# Patient Record
Sex: Female | Born: 1945 | Hispanic: Yes | Marital: Married | Smoking: Former smoker
Health system: Southern US, Community
[De-identification: ages and names within clinical notes are randomized; demographics above are authoritative.]

## PROBLEM LIST (undated history)

## (undated) DIAGNOSIS — I480 Paroxysmal atrial fibrillation: Secondary | ICD-10-CM

## (undated) DIAGNOSIS — I639 Cerebral infarction, unspecified: Secondary | ICD-10-CM

## (undated) DIAGNOSIS — I10 Essential (primary) hypertension: Secondary | ICD-10-CM

## (undated) DIAGNOSIS — E785 Hyperlipidemia, unspecified: Secondary | ICD-10-CM

## (undated) DIAGNOSIS — N3090 Cystitis, unspecified without hematuria: Secondary | ICD-10-CM

## (undated) HISTORY — PX: EYE SURGERY: SHX253

## (undated) HISTORY — PX: ABDOMINAL HYSTERECTOMY: SHX81

---

## 2017-03-14 ENCOUNTER — Encounter (HOSPITAL_COMMUNITY): Payer: Self-pay | Admitting: Emergency Medicine

## 2017-03-14 ENCOUNTER — Emergency Department (HOSPITAL_COMMUNITY): Payer: Medicaid Other

## 2017-03-14 ENCOUNTER — Inpatient Hospital Stay (HOSPITAL_COMMUNITY)
Admission: EM | Admit: 2017-03-14 | Discharge: 2017-03-19 | DRG: 065 | Disposition: A | Discharge status: Rehabilitation Facility | Payer: Medicaid Other | Attending: Internal Medicine | Admitting: Internal Medicine

## 2017-03-14 DIAGNOSIS — I10 Essential (primary) hypertension: Secondary | ICD-10-CM | POA: Diagnosis present

## 2017-03-14 DIAGNOSIS — I639 Cerebral infarction, unspecified: Secondary | ICD-10-CM

## 2017-03-14 DIAGNOSIS — Z833 Family history of diabetes mellitus: Secondary | ICD-10-CM

## 2017-03-14 DIAGNOSIS — R609 Edema, unspecified: Secondary | ICD-10-CM

## 2017-03-14 DIAGNOSIS — K5909 Other constipation: Secondary | ICD-10-CM | POA: Diagnosis present

## 2017-03-14 DIAGNOSIS — Z7902 Long term (current) use of antithrombotics/antiplatelets: Secondary | ICD-10-CM

## 2017-03-14 DIAGNOSIS — R9082 White matter disease, unspecified: Secondary | ICD-10-CM | POA: Diagnosis present

## 2017-03-14 DIAGNOSIS — Z79899 Other long term (current) drug therapy: Secondary | ICD-10-CM

## 2017-03-14 DIAGNOSIS — I471 Supraventricular tachycardia: Secondary | ICD-10-CM | POA: Diagnosis not present

## 2017-03-14 DIAGNOSIS — M25562 Pain in left knee: Secondary | ICD-10-CM

## 2017-03-14 DIAGNOSIS — G9389 Other specified disorders of brain: Secondary | ICD-10-CM | POA: Diagnosis present

## 2017-03-14 DIAGNOSIS — F17201 Nicotine dependence, unspecified, in remission: Secondary | ICD-10-CM

## 2017-03-14 DIAGNOSIS — G319 Degenerative disease of nervous system, unspecified: Secondary | ICD-10-CM | POA: Diagnosis present

## 2017-03-14 DIAGNOSIS — Z8249 Family history of ischemic heart disease and other diseases of the circulatory system: Secondary | ICD-10-CM

## 2017-03-14 DIAGNOSIS — D649 Anemia, unspecified: Secondary | ICD-10-CM | POA: Diagnosis present

## 2017-03-14 DIAGNOSIS — Z7982 Long term (current) use of aspirin: Secondary | ICD-10-CM

## 2017-03-14 DIAGNOSIS — Z823 Family history of stroke: Secondary | ICD-10-CM

## 2017-03-14 DIAGNOSIS — Z9181 History of falling: Secondary | ICD-10-CM

## 2017-03-14 DIAGNOSIS — H532 Diplopia: Secondary | ICD-10-CM

## 2017-03-14 DIAGNOSIS — G459 Transient cerebral ischemic attack, unspecified: Secondary | ICD-10-CM | POA: Diagnosis present

## 2017-03-14 DIAGNOSIS — I63421 Cerebral infarction due to embolism of right anterior cerebral artery: Principal | ICD-10-CM | POA: Diagnosis present

## 2017-03-14 DIAGNOSIS — E785 Hyperlipidemia, unspecified: Secondary | ICD-10-CM

## 2017-03-14 DIAGNOSIS — N3000 Acute cystitis without hematuria: Secondary | ICD-10-CM | POA: Diagnosis present

## 2017-03-14 DIAGNOSIS — I340 Nonrheumatic mitral (valve) insufficiency: Secondary | ICD-10-CM | POA: Diagnosis present

## 2017-03-14 DIAGNOSIS — Z87891 Personal history of nicotine dependence: Secondary | ICD-10-CM

## 2017-03-14 DIAGNOSIS — Z90710 Acquired absence of both cervix and uterus: Secondary | ICD-10-CM

## 2017-03-14 DIAGNOSIS — I63412 Cerebral infarction due to embolism of left middle cerebral artery: Secondary | ICD-10-CM | POA: Diagnosis present

## 2017-03-14 DIAGNOSIS — R402412 Glasgow coma scale score 13-15, at arrival to emergency department: Secondary | ICD-10-CM | POA: Diagnosis present

## 2017-03-14 DIAGNOSIS — Z8673 Personal history of transient ischemic attack (TIA), and cerebral infarction without residual deficits: Secondary | ICD-10-CM

## 2017-03-14 DIAGNOSIS — I480 Paroxysmal atrial fibrillation: Secondary | ICD-10-CM | POA: Diagnosis present

## 2017-03-14 DIAGNOSIS — I69354 Hemiplegia and hemiparesis following cerebral infarction affecting left non-dominant side: Secondary | ICD-10-CM

## 2017-03-14 DIAGNOSIS — R002 Palpitations: Secondary | ICD-10-CM | POA: Diagnosis present

## 2017-03-14 HISTORY — DX: Cerebral infarction, unspecified: I63.9

## 2017-03-14 HISTORY — DX: Essential (primary) hypertension: I10

## 2017-03-14 LAB — COMPREHENSIVE METABOLIC PANEL
ALBUMIN: 3.3 g/dL — AB (ref 3.5–5.0)
ALK PHOS: 63 U/L (ref 38–126)
ALT: 21 U/L (ref 14–54)
AST: 22 U/L (ref 15–41)
Anion gap: 6 (ref 5–15)
BUN: 31 mg/dL — AB (ref 6–20)
CALCIUM: 9.5 mg/dL (ref 8.9–10.3)
CHLORIDE: 106 mmol/L (ref 101–111)
CO2: 25 mmol/L (ref 22–32)
CREATININE: 1.18 mg/dL — AB (ref 0.44–1.00)
GFR calc Af Amer: 53 mL/min — ABNORMAL LOW (ref >60)
GFR calc non Af Amer: 46 mL/min — ABNORMAL LOW (ref >60)
GLUCOSE: 119 mg/dL — AB (ref 65–99)
Potassium: 4 mmol/L (ref 3.5–5.1)
SODIUM: 137 mmol/L (ref 135–145)
Total Bilirubin: 0.5 mg/dL (ref 0.3–1.2)
Total Protein: 7.1 g/dL (ref 6.5–8.1)

## 2017-03-14 LAB — CBC WITH DIFFERENTIAL/PLATELET
Basophils Absolute: 0 10*3/uL (ref 0.0–0.1)
Basophils Relative: 0 %
EOS ABS: 0.5 10*3/uL (ref 0.0–0.7)
Eosinophils Relative: 6 %
HEMATOCRIT: 34.8 % — AB (ref 36.0–46.0)
HEMOGLOBIN: 11.8 g/dL — AB (ref 12.0–15.0)
Lymphocytes Relative: 15 %
Lymphs Abs: 1.2 10*3/uL (ref 0.7–4.0)
MCH: 27.7 pg (ref 26.0–34.0)
MCHC: 33.9 g/dL (ref 30.0–36.0)
MCV: 81.7 fL (ref 78.0–100.0)
MONO ABS: 0.5 10*3/uL (ref 0.1–1.0)
MONOS PCT: 6 %
NEUTROS PCT: 73 %
Neutro Abs: 5.9 10*3/uL (ref 1.7–7.7)
Platelets: 280 10*3/uL (ref 150–400)
RBC: 4.26 MIL/uL (ref 3.87–5.11)
RDW: 13.2 % (ref 11.5–15.5)
WBC: 8.1 10*3/uL (ref 4.0–10.5)

## 2017-03-14 LAB — URINALYSIS, ROUTINE W REFLEX MICROSCOPIC
Bilirubin Urine: NEGATIVE
Glucose, UA: NEGATIVE mg/dL
Ketones, ur: NEGATIVE mg/dL
Nitrite: NEGATIVE
Protein, ur: 100 mg/dL — AB
Specific Gravity, Urine: 1.015 (ref 1.005–1.030)
Squamous Epithelial / HPF: NONE SEEN
pH: 6 (ref 5.0–8.0)

## 2017-03-14 LAB — I-STAT CG4 LACTIC ACID, ED
Lactic Acid, Venous: 1 mmol/L (ref 0.5–1.9)
Lactic Acid, Venous: 0.63 mmol/L (ref 0.5–1.9)

## 2017-03-14 LAB — TSH: TSH: 1.997 u[IU]/mL (ref 0.350–4.500)

## 2017-03-14 LAB — LIPASE, BLOOD: Lipase: 51 U/L (ref 11–51)

## 2017-03-14 LAB — PROTIME-INR
INR: 1.13
Prothrombin Time: 14.5 seconds (ref 11.4–15.2)

## 2017-03-14 MED ORDER — LABETALOL HCL 5 MG/ML IV SOLN
10.0000 mg | INTRAVENOUS | Status: DC | PRN
  Administered 2017-03-15 – 2017-03-16 (×3): 10 mg via INTRAVENOUS
  Filled 2017-03-14 (×4): qty 4

## 2017-03-14 MED ORDER — ACETAMINOPHEN 160 MG/5ML PO SOLN: 650.0000 mg | ORAL | Status: DC | PRN

## 2017-03-14 MED ORDER — ACETAMINOPHEN 325 MG PO TABS
650.0000 mg | ORAL_TABLET | ORAL | Status: DC | PRN
  Administered 2017-03-14 – 2017-03-19 (×8): 650 mg via ORAL
  Filled 2017-03-14 (×8): qty 2

## 2017-03-14 MED ORDER — SIMVASTATIN 20 MG PO TABS
20.0000 mg | ORAL_TABLET | Freq: Every day | ORAL | Status: DC
  Administered 2017-03-14 – 2017-03-19 (×6): 20 mg via ORAL
  Filled 2017-03-14 (×6): qty 1

## 2017-03-14 MED ORDER — TEMAZEPAM 7.5 MG PO CAPS
15.0000 mg | ORAL_CAPSULE | Freq: Every day | ORAL | Status: DC
  Administered 2017-03-14 – 2017-03-18 (×5): 15 mg via ORAL
  Filled 2017-03-14 (×5): qty 2

## 2017-03-14 MED ORDER — CEFTRIAXONE SODIUM 1 G IJ SOLR
1.0000 g | Freq: Once | INTRAMUSCULAR | Status: AC
  Administered 2017-03-14: 1 g via INTRAVENOUS
  Filled 2017-03-14: qty 10

## 2017-03-14 MED ORDER — STROKE: EARLY STAGES OF RECOVERY BOOK
Freq: Once | Status: AC
  Administered 2017-03-14: 1

## 2017-03-14 MED ORDER — ASPIRIN 325 MG PO TABS
325.0000 mg | ORAL_TABLET | Freq: Every day | ORAL | Status: DC
  Administered 2017-03-14 – 2017-03-18 (×5): 325 mg via ORAL
  Filled 2017-03-14 (×6): qty 1

## 2017-03-14 MED ORDER — ENOXAPARIN SODIUM 40 MG/0.4ML ~~LOC~~ SOLN
40.0000 mg | SUBCUTANEOUS | Status: DC
  Administered 2017-03-14 – 2017-03-18 (×5): 40 mg via SUBCUTANEOUS
  Filled 2017-03-14 (×5): qty 0.4

## 2017-03-14 MED ORDER — LATANOPROST 0.005 % OP SOLN
1.0000 [drp] | OPHTHALMIC | Status: DC
  Administered 2017-03-15 – 2017-03-19 (×5): 1 [drp] via OPHTHALMIC
  Filled 2017-03-14: qty 2.5

## 2017-03-14 MED ORDER — DEXTROSE 5 % IV SOLN
1.0000 g | INTRAVENOUS | Status: DC
  Administered 2017-03-15 – 2017-03-16 (×2): 1 g via INTRAVENOUS
  Filled 2017-03-14 (×3): qty 10

## 2017-03-14 MED ORDER — SENNOSIDES-DOCUSATE SODIUM 8.6-50 MG PO TABS
1.0000 | ORAL_TABLET | Freq: Every evening | ORAL | Status: DC | PRN
Start: 2017-03-14 — End: 2017-03-19

## 2017-03-14 MED ORDER — ACETAMINOPHEN 650 MG RE SUPP: 650.0000 mg | RECTAL | Status: DC | PRN

## 2017-03-14 MED ORDER — CLOPIDOGREL BISULFATE 75 MG PO TABS
75.0000 mg | ORAL_TABLET | Freq: Every day | ORAL | Status: DC
  Administered 2017-03-14 – 2017-03-18 (×5): 75 mg via ORAL
  Filled 2017-03-14 (×6): qty 1

## 2017-03-14 NOTE — ED Provider Notes (Signed)
[facility]-EMERGENCY DEPT Provider Note   CSN: [id number] Arrival date & time: 03/14/17  1029     History   Chief Complaint Chief Complaint  Patient presents with  . Altered Mental Status    HPI Christina [name] is a 71 y.o. female with a past medical history significant for hypertension and recent diagnosis of stroke who presents with new headache, no diplopia, and continued left-sided hemiparesis. Patient is accompanied by family who reports that 3 weeks ago, patient was in [place] and had a stroke. Patient had a fall due to left-sided weakness. Patient has minimal documentation from her visit and workup but was found to have stroke with left-sided paralysis. The daughter reports that she has been with patient for the last 2 weeks and recently came back to [place] last night at 3 AM. The patient reports that since returning, she has been having new headache and new horizontal diplopia. Patient also reports some mild nausea.  Patient says that she has had left-sided weakness in her left face, left leg, and left arm for the last 3 weeks. This is slightly improved over time. Patient denies recent fevers, chills, chest pain, shortness of breath or diarrhea. Patient does report some chronic constipation and new dysuria. Patient describes her headache as moderate, sharp throbbing, and all over her head. According to family, patient was started on aspirin, Plavix, losartan, carvedilol, simvastatin, verapamil, and Restoril.        The history is provided by the patient and a relative.  Altered Mental Status   This is a new problem. The current episode started 12 to 24 hours ago. The problem has not changed since onset.Associated symptoms include weakness. Pertinent negatives include no confusion and no agitation. Risk factors include a recent illness and a change in prescription. Her past medical history is significant for CVA (recent) and hypertension.    Past Medical History:  Diagnosis  Date  . Hypertension   . Stroke [facility])     There are no active problems to display for this patient.   Past Surgical History:  Procedure Laterality Date  . ABDOMINAL HYSTERECTOMY    . EYE SURGERY      OB History    No data available       Home Medications    Prior to Admission medications   Medication Sig Start Date End Date Taking? Authorizing Provider  aspirin 325 MG tablet Take 325 mg by mouth daily.   Yes Historical Provider, MD  carvedilol (COREG) 25 MG tablet Take 25 mg by mouth daily.   Yes Historical Provider, MD  clopidogrel (PLAVIX) 75 MG tablet Take 75 mg by mouth daily.   Yes Historical Provider, MD  latanoprost (XALATAN) 0.005 % ophthalmic solution Place 1 drop into both eyes every morning.   Yes Historical Provider, MD  losartan (COZAAR) 50 MG tablet Take 50 mg by mouth daily.   Yes Historical Provider, MD  simvastatin (ZOCOR) 20 MG tablet Take 20 mg by mouth daily.   Yes Historical Provider, MD  temazepam (RESTORIL) 15 MG capsule Take 15 mg by mouth at bedtime.   Yes Historical Provider, MD  verapamil (CALAN-SR) 240 MG CR tablet Take 240 mg by mouth at bedtime.   Yes Historical Provider, MD    Family History No family history on file.  Social History Social History  Substance Use Topics  . Smoking status: Never Smoker  . Smokeless tobacco: Never Used  . Alcohol use No     Allergies  Patient has no known allergies.   Review of Systems Review of Systems  Constitutional: Negative for chills, fatigue and fever.  HENT: Negative for congestion and rhinorrhea.   Eyes: Positive for visual disturbance.  Respiratory: Negative for cough, chest tightness, shortness of breath, wheezing and stridor.   Cardiovascular: Negative for chest pain, palpitations and leg swelling.  Gastrointestinal: Positive for constipation and nausea. Negative for abdominal pain, diarrhea and vomiting.  Genitourinary: Positive for dysuria.  Musculoskeletal: Negative for back pain,  neck pain and neck stiffness.  Skin: Negative for rash and wound.  Neurological: Positive for facial asymmetry, weakness, light-headedness and headaches. Negative for speech difficulty and numbness.  Psychiatric/Behavioral: Negative for agitation and confusion.  All other systems reviewed and are negative.    Physical Exam Updated Vital Signs BP (!) 193/88 (BP Location: Left Arm)   Pulse 96   Temp 98.2 F (36.8 C) (Oral)   Resp 18   Ht 5\' 2"  (1.575 m)   Wt 150 lb (68 kg)   SpO2 100%   BMI 27.44 kg/m   Physical Exam  Constitutional: She is oriented to person, place, and time. She appears well-developed and well-nourished. No distress.  HENT:  Head: Normocephalic and atraumatic.  Right Ear: External ear normal.  Left Ear: External ear normal.  Nose: Nose normal.  Mouth/Throat: Oropharynx is clear and moist. No oropharyngeal exudate.  Eyes: Conjunctivae and EOM are normal. Pupils are equal, round, and reactive to light.  Neck: Normal range of motion. Neck supple.  Cardiovascular: Normal rate, regular rhythm, normal heart sounds and intact distal pulses.   No murmur heard. Pulmonary/Chest: Effort normal. No stridor. No respiratory distress. She has no wheezes. She exhibits no tenderness.  Abdominal: Soft. She exhibits no distension. There is no tenderness. There is no rebound.  Musculoskeletal: She exhibits no tenderness.  Neurological: She is alert and oriented to person, place, and time. She has normal reflexes. She displays no tremor. No sensory deficit. She exhibits abnormal muscle tone. GCS eye subscore is 4. GCS verbal subscore is 5. GCS motor subscore is 6.  Left-sided weakness arm and leg. Mild left facial droop. Diplopia.  Skin: Skin is warm. Capillary refill takes less than 2 seconds. No rash noted. She is not diaphoretic. No erythema.  Psychiatric: She has a normal mood and affect.  Nursing note and vitals reviewed.    ED Treatments / Results  Labs (all labs  ordered are listed, but only abnormal results are displayed) Labs Reviewed  CBC WITH DIFFERENTIAL/PLATELET - Abnormal; Notable for the following:       Result Value   Hemoglobin 11.8 (*)    HCT 34.8 (*)    All other components within normal limits  COMPREHENSIVE METABOLIC PANEL - Abnormal; Notable for the following:    Glucose, Bld 119 (*)    BUN 31 (*)    Creatinine, Ser 1.18 (*)    Albumin 3.3 (*)    GFR calc non Af Amer 46 (*)    GFR calc Af Amer 53 (*)    All other components within normal limits  URINALYSIS, ROUTINE W REFLEX MICROSCOPIC - Abnormal; Notable for the following:    APPearance CLOUDY (*)    Hgb urine dipstick LARGE (*)    Protein, ur 100 (*)    Leukocytes, UA LARGE (*)    Bacteria, UA RARE (*)    All other components within normal limits  URINE CULTURE  LIPASE, BLOOD  PROTIME-INR  TSH  I-STAT CG4 LACTIC ACID,  ED  I-STAT CG4 LACTIC ACID, ED    EKG  EKG Interpretation  Date/Time:  Friday March 14 2017 12:05:12 EDT Ventricular Rate:  85 PR Interval:    QRS Duration: 93 QT Interval:  374 QTC Calculation: 445 R Axis:   62 Text Interpretation:  Sinus rhythm Consider left atrial enlargement Probable LVH with secondary repol abnrm Anterior ST elevation, probably due to LVH Baseline wander in lead(s) V3 V6 No Prior ECG for comparison.  No STEMI Confirmed by [facility] MD, Dewarren Ledbetter [phone]) on 03/14/2017 2:16:37 PM       Radiology Ct Head Wo Contrast  Result Date: 03/14/2017 CLINICAL DATA:  CVA 3 weeks ago.  Persistent weakness and confusion. EXAM: CT HEAD WITHOUT CONTRAST TECHNIQUE: Contiguous axial images were obtained from the base of the skull through the vertex without intravenous contrast. COMPARISON:  None. FINDINGS: Brain: Age related cerebral atrophy, ventriculomegaly and periventricular white matter disease. Probable small lacunar type external capsule lacunar type infarcts on the left. No extra-axial fluid collections are identified. No CT findings for  acute hemispheric infarction or intracranial hemorrhage. No mass lesions. The brainstem and cerebellum are normal. Vascular: No hyperdense vessels or definite aneurysm. Moderate vascular calcifications. Skull: No skull fracture or bone lesion. Sinuses/Orbits: The paranasal sinuses and mastoid air cells are clear. The globes are intact. Other: No scalp lesions. IMPRESSION: Cerebral atrophy, ventriculomegaly and periventricular white matter disease. Suspect lacunar type left basal ganglia infarcts. No hemispheric infarction or intracranial hemorrhage. Moderate vascular calcifications. Electronically Signed   By: Rudie Meyer M.D.   On: 03/14/2017 13:35    Procedures Procedures (including critical care time)  Medications Ordered in ED Medications  cefTRIAXone (ROCEPHIN) 1 g in dextrose 5 % 50 mL IVPB (not administered)     Initial Impression / Assessment and Plan / ED Course  I have reviewed the triage vital signs and the nursing notes.  Pertinent labs & imaging results that were available during my care of the patient were reviewed by me and considered in my medical decision making (see chart for details).     Christina [name] is a 71 y.o. female with a past medical history significant for hypertension and recent diagnosis of stroke who presents with new headache, no diplopia, and continued left-sided hemiparesis.  History and exam are seen above.  On exam, patient has markedly weakness in her left arm and left leg. Mild left facial droop according to the family. Patient had normal extraocular movements. Patient reported continued diplopia. Patient's lungs were clear and abdomen nontender. CVA area nontender.  Due to recent stroke and new headache, nausea, and diplopia, patient will have imaging to look for new stroke or hemorrhagic conversion of prior stroke now that she is on aspirin and Plavix. Patient will have screening laboratory testing to look for significant abnormalities as well as  urinary tract infection given the urea reported.  Anticipate speaking with neurology after workup to determine disposition.  CT scan shows concern for left-sided lacunar infarcts. Next  Neurology was called and they recommended MRI brain without contrast, and admission to [facility] for further neurology evaluation and management. Hospitalist team called and they will admit the patient and organizing transfer.  Patient found to have bacteria and leukocytes and a catheterized urine. Patient will be treated for urinary tract infection. Rocephin given.   Final Clinical Impressions(s) / ED Diagnoses   Final diagnoses:  Cerebrovascular accident (CVA), unspecified mechanism (HCC)  Acute cystitis without hematuria  Diplopia     Clinical  Impression: 1. Cerebrovascular accident (CVA), unspecified mechanism (HCC)   2. Acute cystitis without hematuria   3. Diplopia   4. Swelling     Disposition: Admit to Hospitalist service    Heide Scales, MD 03/14/17 2022

## 2017-03-14 NOTE — Progress Notes (Signed)
Patient arrived from [facility] via [facility]. Welcomed to [facility]M. Patient placed in bed. Will continue to monitor. Lawson RadarHeather M Miriam Liles

## 2017-03-14 NOTE — Consult Note (Signed)
Admission H&P    Chief Complaint: Stroke 3 weeks ago, presenting with headache and diplopia.  HPI: Christina [name] is an 71 y.o. female with a history of hypertension and stroke 3 weeks ago with left-sided weakness, who presented to the ED at [facility] with complaint of diplopia and headache in addition to continued left-sided weakness. Patient has been unable to ambulate since her stroke. She was in [place] at the time, and has subsequently been brought to the [place] by her daughter for further management. She's been taking aspirin and Plavix daily. Patient started indicated that patient had not been getting therapy following her stroke. CT scan of her head showed an equivocal left basal ganglia infarction. MRI showed multiple areas of subacute infarction involving multiple vascular territories, indicative of occipital embolic source. No acute stroke was noted.  LSN: Unclear. tPA Given: No: Recent stroke mRankin:  Past Medical History:  Diagnosis Date  . Hypertension   . Stroke [facility])     Past Surgical History:  Procedure Laterality Date  . ABDOMINAL HYSTERECTOMY    . EYE SURGERY      No family history on file. Social History:  reports that she has never smoked. She has never used smokeless tobacco. She reports that she does not drink alcohol or use drugs.  Allergies: No Known Allergies  Medications Prior to Admission  Medication Sig Dispense Refill  . aspirin 325 MG tablet Take 325 mg by mouth daily.    . carvedilol (COREG) 25 MG tablet Take 25 mg by mouth daily.    . clopidogrel (PLAVIX) 75 MG tablet Take 75 mg by mouth daily.    Marland Kitchen latanoprost (XALATAN) 0.005 % ophthalmic solution Place 1 drop into both eyes every morning.    Marland Kitchen losartan (COZAAR) 50 MG tablet Take 50 mg by mouth daily.    . simvastatin (ZOCOR) 20 MG tablet Take 20 mg by mouth daily.    . temazepam (RESTORIL) 15 MG capsule Take 15 mg by mouth at bedtime.    . verapamil (CALAN-SR) 240 MG CR tablet Take 240  mg by mouth at bedtime.      ROS: History obtained from patient's daughter.  General ROS: negative for - chills, fatigue, fever, night sweats, weight gain or weight loss Psychological ROS: negative for - behavioral disorder, hallucinations, memory difficulties, mood swings or suicidal ideation Ophthalmic ROS: negative for - blurry vision, double vision, eye pain or loss of vision ENT ROS: negative for - epistaxis, nasal discharge, oral lesions, sore throat, tinnitus or vertigo Allergy and Immunology ROS: negative for - hives or itchy/watery eyes Hematological and Lymphatic ROS: negative for - bleeding problems, bruising or swollen lymph nodes Endocrine ROS: negative for - galactorrhea, hair pattern changes, polydipsia/polyuria or temperature intolerance Respiratory ROS: negative for - cough, hemoptysis, shortness of breath or wheezing Cardiovascular ROS: negative for - chest pain, dyspnea on exertion, edema or irregular heartbeat Gastrointestinal ROS: negative for - abdominal pain, diarrhea, hematemesis, nausea/vomiting or stool incontinence Genito-Urinary ROS: negative for - dysuria, hematuria, incontinence or urinary frequency/urgency Musculoskeletal ROS: negative for - joint swelling or muscular weakness Neurological ROS: as noted in HPI Dermatological ROS: negative for rash and skin lesion changes  Physical Examination: Blood pressure (!) 122/52, pulse 72, temperature 98.7 F (37.1 C), temperature source Oral, resp. rate 18, height 5' 2" (1.575 m), weight 63 kg (138 lb 14.2 oz), SpO2 99 %.  HEENT-  Normocephalic, no lesions, without obvious abnormality.  Normal external eye and conjunctiva.  Normal  TM's bilaterally.  Normal auditory canals and external ears. Normal external nose, mucus membranes and septum.  Normal pharynx. Neck supple with no masses, nodes, nodules or enlargement. Cardiovascular - regular rate and rhythm, S1, S2 normal, no murmur, click, rub or gallop Lungs - chest  clear, no wheezing, rales, normal symmetric air entry Abdomen - soft, non-tender; bowel sounds normal; no masses,  no organomegaly Extremities - no joint deformities, effusion, or inflammation  Neurologic Examination: Patient was given sedative medication to obtain an MRI study and was sedated at the time of this evaluation. She had minimal response to verbal and tactile stimulation with eye opening and able to follow only minimal commands. Pupils were equal and reacted normally to light. Extraocular movements were full and conjugate. Moderate left lower facial weakness was noted. Motor exam showed moderately severe left flaccid hemiparesis. Strength of right extremities appeared to be normal. Deep tendon reflexes were 1+ and symmetrical. Left plantar response was extensor, and right, flexor. Carotid auscultation was normal.  Results for orders placed or performed during the hospital encounter of 03/14/17 (from the past 48 hour(s))  CBC with Differential     Status: Abnormal   Collection Time: 03/14/17 11:52 AM  Result Value Ref Range   WBC 8.1 4.0 - 10.5 K/uL   RBC 4.26 3.87 - 5.11 MIL/uL   Hemoglobin 11.8 (L) 12.0 - 15.0 g/dL   HCT 34.8 (L) 36.0 - 46.0 %   MCV 81.7 78.0 - 100.0 fL   MCH 27.7 26.0 - 34.0 pg   MCHC 33.9 30.0 - 36.0 g/dL   RDW 13.2 11.5 - 15.5 %   Platelets 280 150 - 400 K/uL   Neutrophils Relative % 73 %   Neutro Abs 5.9 1.7 - 7.7 K/uL   Lymphocytes Relative 15 %   Lymphs Abs 1.2 0.7 - 4.0 K/uL   Monocytes Relative 6 %   Monocytes Absolute 0.5 0.1 - 1.0 K/uL   Eosinophils Relative 6 %   Eosinophils Absolute 0.5 0.0 - 0.7 K/uL   Basophils Relative 0 %   Basophils Absolute 0.0 0.0 - 0.1 K/uL  Comprehensive metabolic panel     Status: Abnormal   Collection Time: 03/14/17 11:52 AM  Result Value Ref Range   Sodium 137 135 - 145 mmol/L   Potassium 4.0 3.5 - 5.1 mmol/L   Chloride 106 101 - 111 mmol/L   CO2 25 22 - 32 mmol/L   Glucose, Bld 119 (H) 65 - 99 mg/dL    BUN 31 (H) 6 - 20 mg/dL   Creatinine, Ser 1.18 (H) 0.44 - 1.00 mg/dL   Calcium 9.5 8.9 - 10.3 mg/dL   Total Protein 7.1 6.5 - 8.1 g/dL   Albumin 3.3 (L) 3.5 - 5.0 g/dL   AST 22 15 - 41 U/L   ALT 21 14 - 54 U/L   Alkaline Phosphatase 63 38 - 126 U/L   Total Bilirubin 0.5 0.3 - 1.2 mg/dL   GFR calc non Af Amer 46 (L) >60 mL/min   GFR calc Af Amer 53 (L) >60 mL/min    Comment: (NOTE) The eGFR has been calculated using the CKD EPI equation. This calculation has not been validated in all clinical situations. eGFR's persistently <60 mL/min signify possible Chronic Kidney Disease.    Anion gap 6 5 - 15  Lipase, blood     Status: None   Collection Time: 03/14/17 11:52 AM  Result Value Ref Range   Lipase 51 11 - 51 U/L  Protime-INR     Status: None   Collection Time: 03/14/17 11:52 AM  Result Value Ref Range   Prothrombin Time 14.5 11.4 - 15.2 seconds   INR 1.13   TSH     Status: None   Collection Time: 03/14/17 11:52 AM  Result Value Ref Range   TSH 1.997 0.350 - 4.500 uIU/mL    Comment: Performed by a 3rd Generation assay with a functional sensitivity of <=0.01 uIU/mL.  I-Stat CG4 Lactic Acid, ED     Status: None   Collection Time: 03/14/17 12:03 PM  Result Value Ref Range   Lactic Acid, Venous 1.00 0.5 - 1.9 mmol/L  Urinalysis, Routine w reflex microscopic     Status: Abnormal   Collection Time: 03/14/17  1:06 PM  Result Value Ref Range   Color, Urine YELLOW YELLOW   APPearance CLOUDY (A) CLEAR   Specific Gravity, Urine 1.015 1.005 - 1.030   pH 6.0 5.0 - 8.0   Glucose, UA NEGATIVE NEGATIVE mg/dL   Hgb urine dipstick LARGE (A) NEGATIVE   Bilirubin Urine NEGATIVE NEGATIVE   Ketones, ur NEGATIVE NEGATIVE mg/dL   Protein, ur 100 (A) NEGATIVE mg/dL   Nitrite NEGATIVE NEGATIVE   Leukocytes, UA LARGE (A) NEGATIVE   RBC / HPF TOO NUMEROUS TO COUNT 0 - 5 RBC/hpf   WBC, UA TOO NUMEROUS TO COUNT 0 - 5 WBC/hpf   Bacteria, UA RARE (A) NONE SEEN   Squamous Epithelial / LPF NONE SEEN  NONE SEEN   WBC Clumps PRESENT   I-Stat CG4 Lactic Acid, ED     Status: None   Collection Time: 03/14/17  4:48 PM  Result Value Ref Range   Lactic Acid, Venous 0.63 0.5 - 1.9 mmol/L   Ct Head Wo Contrast  Result Date: 03/14/2017 CLINICAL DATA:  CVA 3 weeks ago.  Persistent weakness and confusion. EXAM: CT HEAD WITHOUT CONTRAST TECHNIQUE: Contiguous axial images were obtained from the base of the skull through the vertex without intravenous contrast. COMPARISON:  None. FINDINGS: Brain: Age related cerebral atrophy, ventriculomegaly and periventricular white matter disease. Probable small lacunar type external capsule lacunar type infarcts on the left. No extra-axial fluid collections are identified. No CT findings for acute hemispheric infarction or intracranial hemorrhage. No mass lesions. The brainstem and cerebellum are normal. Vascular: No hyperdense vessels or definite aneurysm. Moderate vascular calcifications. Skull: No skull fracture or bone lesion. Sinuses/Orbits: The paranasal sinuses and mastoid air cells are clear. The globes are intact. Other: No scalp lesions. IMPRESSION: Cerebral atrophy, ventriculomegaly and periventricular white matter disease. Suspect lacunar type left basal ganglia infarcts. No hemispheric infarction or intracranial hemorrhage. Moderate vascular calcifications. Electronically Signed   By: Marijo Sanes M.D.   On: 03/14/2017 13:35   Mr Brain Wo Contrast  Result Date: 03/14/2017 CLINICAL DATA:  Stroke sustained 3 weeks ago in [place]. Unable to walk. Confusion. EXAM: MRI HEAD WITHOUT CONTRAST TECHNIQUE: Multiplanar, multiecho pulse sequences of the brain and surrounding structures were obtained without intravenous contrast. COMPARISON:  CT same day FINDINGS: Brain: Diffusion imaging shows multiple vascular distributions subacute infarctions consistent with embolic disease from the heart or ascending aorta. There is acute infarction in the right thalamus, the left  parieto-occipital junction cortical and subcortical brain and the right para median frontal brain. No evidence hemorrhage or mass effect. The brainstem is unremarkable. There are a few old small vessel cerebellar infarctions. Cerebral hemispheres otherwise show atrophy with chronic small-vessel ischemic changes of the deep and subcortical white matter  and old lacunar infarctions in the basal ganglia. No evidence of neoplastic mass lesion. Ventricles are prominent, probably secondary to atrophy. Normal pressure hydrocephalus not excluded but not specifically suggested. Vascular: Major vessels at the base of the brain show flow. 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 There is involvement of the right thalamus, left parieto-occipital junction region and right frontal para median brain. Chronic small-vessel ischemic changes elsewhere as described. Atrophy. Ventricular prominence probably secondary to ex vacuo enlargement. Normal pressure hydrocephalus not excluded, but not specifically favored. Electronically Signed   By: Nelson Chimes M.D.   On: 03/14/2017 15:12    Assessment: 71 y.o. female with hypertension and stroke with left hemiparesis 3 weeks ago presenting with complaint of headache and diplopia. MRI did not show signs of recurrent acute stroke but showed multiple subacute areas of infarction in multiple vascular territories, indicative of proximal source for emboli.  Stroke Risk Factors - hypertension  Plan: 1. HgbA1c, fasting lipid panel 2. MRA  of the brain without contrast 3. PT consult, OT consult, Speech consult 4. Echocardiogram 5. Carotid dopplers 6. Prophylactic therapy-aspirin and Plavix 7. Risk factor modification 8. Telemetry monitoring  C.R. Nicole Kindred, MD [facility] [phone]  03/14/2017,  10:10 PM

## 2017-03-14 NOTE — ED Notes (Signed)
Assisted pt to her L side.  Pt was c/o pain in her buttom area from laying on it.  No redness or pressure ulcer noted.

## 2017-03-14 NOTE — ED Notes (Signed)
Patient transported to MRI 

## 2017-03-14 NOTE — ED Notes (Signed)
ED Provider at bedside. 

## 2017-03-14 NOTE — ED Notes (Signed)
Dr Roxy Mannsster neurology called back promptly.

## 2017-03-14 NOTE — ED Notes (Signed)
Called report to [facility]ather [facility] at [facility], [facility] contacted for transport.

## 2017-03-14 NOTE — ED Triage Notes (Signed)
Per family, patient has stroke 3 weeks ago in [place]. Patient arrived in the [place] today. Per family, patient has not been able to walk, confusion, and depression since the stroke. No new mental changes today however patient's family wants her to evaluated. Patient's daughter states "I want everything checked on my mother." Patient is alert, oriented to person, place. Disoriented to time.

## 2017-03-14 NOTE — ED Notes (Signed)
Pt returned from MRI °

## 2017-03-14 NOTE — ED Notes (Signed)
Bed: WA21 Expected date:  Expected time:  Means of arrival:  Comments: 

## 2017-03-14 NOTE — ED Notes (Signed)
Patient transported to CT 

## 2017-03-14 NOTE — H&P (Addendum)
History and Physical    Christina [name] WUJ:[id number] DOB: 03/16/1946 DOA: 03/14/2017  I have briefly reviewed the patient's prior medical records in [facility]  PCP: Pcp Not In System  Patient coming from: home  Chief Complaint: Headaches, diplopia  HPI: Christina [name] is a 71 y.o. female with medical history significant of recently diagnosed stroke about 3 weeks ago, hypertension, hyperlipidemia, prior history of tobacco abuse, presents to the emergency room with chief complaint of headaches as well as double vision today.  Patient was recently diagnosed with a stroke, she was in [place] and went to the hospital over there complaining of left-sided weakness, she tells me she was unable to move at all her left side.  She was hospitalized, she had an MRI of the brain done and she was diagnosed with a stroke.  She was placed on dual antiplatelet therapy with aspirin and Plavix, she was also found to be severely hypertensive and placed on antihypertensives.  She was also started him on statin.  She was discharged home, however patient was unable to ambulate at home, and patient's family is quite upset that there was no help at home for the patient.  Currently she states that she has been seeing double for the past day, and has a headache.  She denies any chest pain, she denies any shortness of breath.  She has no abdominal pain, no nausea, vomiting or diarrhea.  She is also complaining of dysuria her left-sided weakness has slowly improved over the last 3 weeks, however patient is still unable to ambulate.  She complains of intermittent palpitations when she feels like her heart is racing.  She is also been complaining of left thigh and left knee pain intermittent over the last few weeks.  Ever since her stroke, she has had slight facial droop and slurred speech which "comes and goes"  ED Course: In the ED, patient is hypertensive with blood pressures and into the 170s-190s, afebrile and satting  well on room air.  Blood work is remarkable for a creatinine of 1.18 with a BUN of 31, and mild anemia with a hemoglobin of 11.8.  Otherwise unremarkable.  She underwent CT scan of the brain which showed a suspected lacunar type left basal ganglia infarcts without any hemorrhage.  EDP discussed with Dr. Roxy Manns from neurology who recommended admission for CVA workup and to be admitted at [facility] from [place] along emergency room.  Review of Systems: As per HPI otherwise 10 point review of systems negative.   Past Medical History:  Diagnosis Date  . Hypertension   . Stroke [facility])     Past Surgical History:  Procedure Laterality Date  . ABDOMINAL HYSTERECTOMY    . EYE SURGERY       reports that she has never smoked. She has never used smokeless tobacco. She reports that she does not drink alcohol or use drugs.  No Known Allergies  Family history positive for heart disease, stroke as well as diabetes mellitus in her parents  Prior to Admission medications   Medication Sig Start Date End Date Taking? Authorizing Provider  aspirin 325 MG tablet Take 325 mg by mouth daily.   Yes Historical Provider, MD  carvedilol (COREG) 25 MG tablet Take 25 mg by mouth daily.   Yes Historical Provider, MD  clopidogrel (PLAVIX) 75 MG tablet Take 75 mg by mouth daily.   Yes Historical Provider, MD  latanoprost (XALATAN) 0.005 % ophthalmic solution Place 1 drop into both  eyes every morning.   Yes Historical Provider, MD  losartan (COZAAR) 50 MG tablet Take 50 mg by mouth daily.   Yes Historical Provider, MD  simvastatin (ZOCOR) 20 MG tablet Take 20 mg by mouth daily.   Yes Historical Provider, MD  temazepam (RESTORIL) 15 MG capsule Take 15 mg by mouth at bedtime.   Yes Historical Provider, MD  verapamil (CALAN-SR) 240 MG CR tablet Take 240 mg by mouth at bedtime.   Yes Historical Provider, MD    Physical Exam: Vitals:   03/14/17 1038 03/14/17 1039 03/14/17 1300 03/14/17 1415  BP: (!) 193/88  (!)  177/84 (!) 188/96  Pulse: 96  85 89  Resp: 18  15 15   Temp: 98.2 F (36.8 C)     TempSrc: Oral     SpO2: 100%  98% 99%  Weight:  68 kg (150 lb)    Height:  5\' 2"  (1.575 m)      Constitutional: NAD, calm, comfortable Vitals:   03/14/17 1038 03/14/17 1039 03/14/17 1300 03/14/17 1415  BP: (!) 193/88  (!) 177/84 (!) 188/96  Pulse: 96  85 89  Resp: 18  15 15   Temp: 98.2 F (36.8 C)     TempSrc: Oral     SpO2: 100%  98% 99%  Weight:  68 kg (150 lb)    Height:  5\' 2"  (1.575 m)     Eyes: PERRL, lids and conjunctivae normal, EOMI ENMT: Mucous membranes are moist. Posterior pharynx clear of any exudate or lesions.  Neck: normal, supple, no masses Respiratory: clear to auscultation bilaterally, no wheezing, no crackles. Normal respiratory effort. No accessory muscle use.  Cardiovascular: Regular rate and rhythm, no murmurs / rubs / gallops. No extremity edema. 2+ pedal pulses.  Abdomen: no tenderness, no masses palpated. Bowel sounds positive.  Musculoskeletal: no clubbing / cyanosis. Normal muscle tone.  Skin: no rashes, lesions, ulcers. No induration Neurologic: CN 2-12 grossly intact.  Strength 4-/5 on the left upper and left lower extremity, 5 out of 5 on right.  Slight facial droop and slurred speech noted Psychiatric: Normal judgment and insight. Alert and oriented x 3. Normal mood.   Labs on Admission: I have personally reviewed following labs and imaging studies  CBC:  Recent Labs Lab 03/14/17 1152  WBC 8.1  NEUTROABS 5.9  HGB 11.8*  HCT 34.8*  MCV 81.7  PLT 280   Basic Metabolic Panel:  Recent Labs Lab 03/14/17 1152  NA 137  K 4.0  CL 106  CO2 25  GLUCOSE 119*  BUN 31*  CREATININE 1.18*  CALCIUM 9.5   GFR: Estimated Creatinine Clearance: 40.1 mL/min (A) (by C-G formula based on SCr of 1.18 mg/dL (H)). Liver Function Tests:  Recent Labs Lab 03/14/17 1152  AST 22  ALT 21  ALKPHOS 63  BILITOT 0.5  PROT 7.1  ALBUMIN 3.3*    Recent Labs Lab  03/14/17 1152  LIPASE 51   No results for input(s): AMMONIA in the last 168 hours. Coagulation Profile:  Recent Labs Lab 03/14/17 1152  INR 1.13   Cardiac Enzymes: No results for input(s): CKTOTAL, CKMB, CKMBINDEX, TROPONINI in the last 168 hours. BNP (last 3 results) No results for input(s): PROBNP in the last 8760 hours. HbA1C: No results for input(s): HGBA1C in the last 72 hours. CBG: No results for input(s): GLUCAP in the last 168 hours. Lipid Profile: No results for input(s): CHOL, HDL, LDLCALC, TRIG, CHOLHDL, LDLDIRECT in the last 72 hours. Thyroid Function Tests:  Recent Labs  03/14/17 1152  TSH 1.997   Anemia Panel: No results for input(s): VITAMINB12, FOLATE, FERRITIN, TIBC, IRON, RETICCTPCT in the last 72 hours. Urine analysis:    Component Value Date/Time   COLORURINE YELLOW 03/14/2017 1306   APPEARANCEUR CLOUDY (A) 03/14/2017 1306   LABSPEC 1.015 03/14/2017 1306   PHURINE 6.0 03/14/2017 1306   GLUCOSEU NEGATIVE 03/14/2017 1306   HGBUR LARGE (A) 03/14/2017 1306   BILIRUBINUR NEGATIVE 03/14/2017 1306   KETONESUR NEGATIVE 03/14/2017 1306   PROTEINUR 100 (A) 03/14/2017 1306   NITRITE NEGATIVE 03/14/2017 1306   LEUKOCYTESUR LARGE (A) 03/14/2017 1306     Radiological Exams on Admission: Ct Head Wo Contrast  Result Date: 03/14/2017 CLINICAL DATA:  CVA 3 weeks ago.  Persistent weakness and confusion. EXAM: CT HEAD WITHOUT CONTRAST TECHNIQUE: Contiguous axial images were obtained from the base of the skull through the vertex without intravenous contrast. COMPARISON:  None. FINDINGS: Brain: Age related cerebral atrophy, ventriculomegaly and periventricular white matter disease. Probable small lacunar type external capsule lacunar type infarcts on the left. No extra-axial fluid collections are identified. No CT findings for acute hemispheric infarction or intracranial hemorrhage. No mass lesions. The brainstem and cerebellum are normal. Vascular: No hyperdense  vessels or definite aneurysm. Moderate vascular calcifications. Skull: No skull fracture or bone lesion. Sinuses/Orbits: The paranasal sinuses and mastoid air cells are clear. The globes are intact. Other: No scalp lesions. IMPRESSION: Cerebral atrophy, ventriculomegaly and periventricular white matter disease. Suspect lacunar type left basal ganglia infarcts. No hemispheric infarction or intracranial hemorrhage. Moderate vascular calcifications. Electronically Signed   By: Rudie MeyerP.  Gallerani M.D.   On: 03/14/2017 13:35    EKG: Independently reviewed. Sinus rhythm  Assessment/Plan Active Problems:   TIA (transient ischemic attack)   History of CVA (cerebrovascular accident)   HTN (hypertension)   HLD (hyperlipidemia)   Tobacco abuse, in remission   TIA/CVA -We will admit patient, consult neurology, she will need full evaluation given evidence of recent stroke in [place].  She is on aspirin and statin and Plavix, continue -We will obtain a 2D echo, carotids, lipid panel, A1c -MRI of the brain is pending -PT/OT/speech consult  Hypertension -Allow permissive hypertension, labetalol as needed for blood pressure greater than 210  Hyperlipidemia -Continue statin  Tobacco abuse, in remission  Left lower extremity pain / subjective swelling -Obtain ultrasound to rule out DVT  Creatinine elevation -Her creatinine is slightly elevated to 1.18, GFR of 46, unclear baseline, monitor  UTI -Patient with complaints of dysuria and a urinalysis grossly positive.  Send urine cultures, start ceftriaxone   DVT prophylaxis: Lovenox Code Status: Full code Family Communication: Discussed with daughter at bedside Disposition Plan: Admit to telemetry Consults called: Neurology    Admission status: Observation  At the point of initial evaluation, it is my clinical opinion that admission for OBSERVATION is reasonable and necessary because the patient's presenting complaints in the context of their  chronic conditions represent sufficient risk of deterioration or significant morbidity to constitute reasonable grounds for close observation in the hospital setting, but that the patient may be medically stable for discharge from the hospital within 24 to 48 hours.   Pamella Pertostin Alyanna Stoermer, MD [facility] Pager [phone]  If 7PM-7AM, please contact night-coverage [email or web address] Password [email or web address]  03/14/2017, 2:32 PM

## 2017-03-14 NOTE — ED Notes (Signed)
Hospitalist at bedside 

## 2017-03-15 ENCOUNTER — Observation Stay (HOSPITAL_BASED_OUTPATIENT_CLINIC_OR_DEPARTMENT_OTHER): Payer: Medicaid Other

## 2017-03-15 ENCOUNTER — Observation Stay (HOSPITAL_COMMUNITY): Payer: Medicaid Other

## 2017-03-15 ENCOUNTER — Encounter (HOSPITAL_COMMUNITY): Payer: Self-pay

## 2017-03-15 DIAGNOSIS — I63412 Cerebral infarction due to embolism of left middle cerebral artery: Secondary | ICD-10-CM | POA: Diagnosis present

## 2017-03-15 DIAGNOSIS — R002 Palpitations: Secondary | ICD-10-CM | POA: Diagnosis present

## 2017-03-15 DIAGNOSIS — N3000 Acute cystitis without hematuria: Secondary | ICD-10-CM | POA: Diagnosis present

## 2017-03-15 DIAGNOSIS — H532 Diplopia: Secondary | ICD-10-CM | POA: Diagnosis present

## 2017-03-15 DIAGNOSIS — Z90710 Acquired absence of both cervix and uterus: Secondary | ICD-10-CM | POA: Diagnosis not present

## 2017-03-15 DIAGNOSIS — G319 Degenerative disease of nervous system, unspecified: Secondary | ICD-10-CM | POA: Diagnosis present

## 2017-03-15 DIAGNOSIS — R9082 White matter disease, unspecified: Secondary | ICD-10-CM | POA: Diagnosis present

## 2017-03-15 DIAGNOSIS — I6340 Cerebral infarction due to embolism of unspecified cerebral artery: Secondary | ICD-10-CM

## 2017-03-15 DIAGNOSIS — E785 Hyperlipidemia, unspecified: Secondary | ICD-10-CM | POA: Diagnosis present

## 2017-03-15 DIAGNOSIS — G9389 Other specified disorders of brain: Secondary | ICD-10-CM | POA: Diagnosis present

## 2017-03-15 DIAGNOSIS — R402412 Glasgow coma scale score 13-15, at arrival to emergency department: Secondary | ICD-10-CM | POA: Diagnosis present

## 2017-03-15 DIAGNOSIS — R2689 Other abnormalities of gait and mobility: Secondary | ICD-10-CM | POA: Diagnosis not present

## 2017-03-15 DIAGNOSIS — I63421 Cerebral infarction due to embolism of right anterior cerebral artery: Secondary | ICD-10-CM | POA: Diagnosis not present

## 2017-03-15 DIAGNOSIS — I340 Nonrheumatic mitral (valve) insufficiency: Secondary | ICD-10-CM | POA: Diagnosis present

## 2017-03-15 DIAGNOSIS — Z9181 History of falling: Secondary | ICD-10-CM | POA: Diagnosis not present

## 2017-03-15 DIAGNOSIS — I6789 Other cerebrovascular disease: Secondary | ICD-10-CM

## 2017-03-15 DIAGNOSIS — I10 Essential (primary) hypertension: Secondary | ICD-10-CM | POA: Diagnosis present

## 2017-03-15 DIAGNOSIS — D649 Anemia, unspecified: Secondary | ICD-10-CM | POA: Diagnosis present

## 2017-03-15 DIAGNOSIS — I69354 Hemiplegia and hemiparesis following cerebral infarction affecting left non-dominant side: Secondary | ICD-10-CM | POA: Diagnosis not present

## 2017-03-15 DIAGNOSIS — M25562 Pain in left knee: Secondary | ICD-10-CM

## 2017-03-15 DIAGNOSIS — K5909 Other constipation: Secondary | ICD-10-CM | POA: Diagnosis present

## 2017-03-15 DIAGNOSIS — I480 Paroxysmal atrial fibrillation: Secondary | ICD-10-CM | POA: Diagnosis present

## 2017-03-15 DIAGNOSIS — I471 Supraventricular tachycardia: Secondary | ICD-10-CM | POA: Diagnosis not present

## 2017-03-15 LAB — LIPID PANEL
Cholesterol: 131 mg/dL (ref 0–200)
HDL: 34 mg/dL — ABNORMAL LOW (ref >40)
LDL CALC: 62 mg/dL (ref 0–99)
Total CHOL/HDL Ratio: 3.9 RATIO
Triglycerides: 175 mg/dL — ABNORMAL HIGH (ref <150)
VLDL: 35 mg/dL (ref 0–40)

## 2017-03-15 LAB — ECHOCARDIOGRAM COMPLETE
Height: 62 in
WEIGHTICAEL: 2222.24 [oz_av]

## 2017-03-15 MED ORDER — IOPAMIDOL (ISOVUE-370) INJECTION 76%
INTRAVENOUS | Status: AC
  Administered 2017-03-15: 50 mL
  Filled 2017-03-15: qty 50

## 2017-03-15 NOTE — Care Management Note (Signed)
Case Management Note  Patient Details  Name: Hardin Negusrinda Dannenberg MRN: [id number] Date of Birth: [birth year]  Subjective/Objective:                 Anticipate patient status to change to IP per Deliah Goodyuth M RN CM. As IP, patient would be eligible to be screened for CIR. CM placed CSW consult for assessment to admit to SNF w LOG at DC as back up plan.    Action/Plan:  CM will continue to follow.  Expected Discharge Date:                  Expected Discharge Plan:   (To be determined)  In-House Referral:  Clinical Social Work  Discharge planning Services  CM Consult  Post Acute Care Choice:    Choice offered to:     DME Arranged:    DME Agency:     HH Arranged:    HH Agency:     Status of Service:  In process, will continue to follow  If discussed at Long Length of Stay Meetings, dates discussed:    Additional Comments:  Lawerance SabalDebbie Shamel Germond, RN 03/15/2017, 1:44 PM

## 2017-03-15 NOTE — Progress Notes (Signed)
VASCULAR LAB PRELIMINARY  PRELIMINARY  PRELIMINARY  PRELIMINARY  Left lower extremity venous duplex completed.    Preliminary report:  There is no DVT noted in the left lower extremity.  There is superficial thrombosis noted in the left lesser saphenous vein and in varicosities in the left calf.   Teancum Brule, RVT 03/15/2017, 10:51 AM

## 2017-03-15 NOTE — Progress Notes (Signed)
VASCULAR LAB    Patient has CTA of her head and neck ordered. Please advise if Carotid duplex still needed.     Thank you,   Lundynn Cohoon, RVT 03/15/2017, 7:52 AM

## 2017-03-15 NOTE — Evaluation (Signed)
Occupational Therapy Evaluation Patient Details Name: Christina [name] MRN: [id number] DOB: 09-04-1946 Today's Date: 03/15/2017    History of Present Illness Pt is a 71 y/o female who presents with L-sided weakness, diplopia, and HA. 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 This was in [place]. [place] after she was not improving. MRI of brain revealed no acute changes.    Clinical Impression   PTA Pt very independent when living in [place], but since stroke (her children brought her here) she has been max A for ADL and mobility in wc (Pt assists with transfers) Pt's first language is spanish, but declined translator, but still looking to kids from time to time for translation of directions. Inconsistent direction following, delayed problem solving, and inconsistent performance during limited evaluation. Pt's blood pressure was elevated and so OOB was declined (182/80 - RN notified). Pt will benefit from skilled OT in the acute setting and will require CIR level therapy to maximize safety and independence in ADL to return to PLOF.     Follow Up Recommendations  CIR;Supervision/Assistance - 24 hour    Equipment Recommendations  Other (comment) (defer to next venue)    Recommendations for Other Services Rehab consult     Precautions / Restrictions Precautions Precautions: Fall Precaution Comments: Inconsistent impairments throughout session. Restrictions Weight Bearing Restrictions: No      Mobility Bed Mobility               General bed mobility comments: Limited evaluation performed at bed level due to elevated BP  Transfers                 General transfer comment: Limited evaluation due to elevated BP this session    Balance                                           ADL either performed or assessed with clinical judgement   ADL Overall ADL's : Needs  assistance/impaired Eating/Feeding: Set up;Sitting   Grooming: Set up;Sitting   Upper Body Bathing: Moderate assistance   Lower Body Bathing: Maximal assistance   Upper Body Dressing : Minimal assistance   Lower Body Dressing: Maximal assistance   Toilet Transfer: Maximal assistance;+2 for physical assistance;+2 for safety/equipment;BSC;RW;Stand-pivot           Functional mobility during ADLs: +2 for physical assistance;Moderate assistance General ADL Comments: no actual transfers were attempted due to elevated blood pressure (182/80) spoke with RN who recommended no OOB activity at this time)     Vision Baseline Vision/History: No visual deficits Patient Visual Report: No change from baseline Vision Assessment?: Yes Eye Alignment: Within Functional Limits Ocular Range of Motion: Within Functional Limits Alignment/Gaze Preference: Within Defined Limits Tracking/Visual Pursuits: Able to track stimulus in all quads without difficulty Saccades: Within functional limits Convergence: Within functional limits Visual Fields: No apparent deficits Additional Comments: Pt complains of dizziness and said it was "blurry vision" but no dizziness or blurryness/double vision with sudden head turns or tracking, Pt reporting mixed signals. When at midline, Pt with slight beat in pupils     Perception     Praxis      Pertinent Vitals/Pain Pain Assessment: No/denies pain      Hand Dominance Right   Extremity/Trunk Assessment Upper Extremity Assessment  Upper Extremity Assessment: LUE deficits/detail;Generalized weakness LUE Deficits / Details: Pt "unable" to reach full elbow extension, but able to hold 90 degrees forward flexion from elbow for 10 seconds with eyes closed. Pt with good grip strength and able to resist and pull at elbow flexion LUE Sensation:  (WFL per Pt report)   Lower Extremity Assessment Lower Extremity Assessment: Defer to PT evaluation   Cervical / Trunk  Assessment Cervical / Trunk Assessment: Other exceptions Cervical / Trunk Exceptions: Forward head/rounded shoulder posture   Communication Communication Communication: Prefers language other than English (Pt states that she is bilingual, lived in [place] for 30 yr)   Cognition Arousal/Alertness: Awake/alert Behavior During Therapy: WFL for tasks assessed/performed Overall Cognitive Status: Impaired/Different from baseline Area of Impairment: Following commands;Safety/judgement;Problem solving                       Following Commands: Follows one step commands with increased time;Follows one step commands inconsistently Safety/Judgement: Decreased awareness of safety;Decreased awareness of deficits   Problem Solving: Slow processing;Decreased initiation;Difficulty sequencing;Requires verbal cues General Comments: Pt very easily distracted during testing/evaluation, unable to find "2 days from now" when given the day of the week, and then still read the wrong thing from the menu. When OT asked about reading english Pt stated "when I lived in [place] I read the paper every day"   General Comments  Pt's son and daughter in law, granddaughter, and grandson in room for session    Exercises     Shoulder Instructions      Home Living Family/patient expects to be discharged to:: Private residence Living Arrangements: Children Available Help at Discharge: Family;Available PRN/intermittently Type of Home: House Home Access: Level entry     Home Layout: One level     Bathroom Shower/Tub: Chief Strategy Officer: Standard Bathroom Accessibility: Yes How Accessible: Accessible via walker Home Equipment: Wheelchair - manual      Lives With: Daughter    Prior Functioning/Environment Level of Independence: Needs assistance  Gait / Transfers Assistance Needed: Daughter reports that she has been doing ~75% of the work when assisting her. Could take a few steps from  bed to w/c and then from w/c to toilet.  ADL's / Homemaking Assistance Needed: Pt able to assist minimally with sponge bathing and self-care but daughter reports she has been doing most of it for her since her stroke 3 weeks ago            OT Problem List: Decreased strength;Decreased range of motion;Decreased activity tolerance;Impaired balance (sitting and/or standing);Decreased safety awareness;Decreased cognition;Pain;Impaired UE functional use      OT Treatment/Interventions: Self-care/ADL training;DME and/or AE instruction;Neuromuscular education;Therapeutic exercise;Therapeutic activities;Patient/family education;Balance training    OT Goals(Current goals can be found in the care plan section) Acute Rehab OT Goals Patient Stated Goal: Get better and more independent OT Goal Formulation: With patient/family Time For Goal Achievement: 03/29/17 Potential to Achieve Goals: Good ADL Goals Pt Will Perform Upper Body Bathing: with supervision;sitting Pt Will Perform Lower Body Bathing: with supervision;sitting/lateral leans Pt Will Transfer to Toilet: with min guard assist;stand pivot transfer;bedside commode Pt Will Perform Toileting - Clothing Manipulation and hygiene: with modified independence;sit to/from stand Additional ADL Goal #1: Pt will perform bed mobility at supervision level prior to ADL  OT Frequency: Min 3X/week   Barriers to D/C:            Co-evaluation  End of Session Nurse Communication: Other (comment) (BP status)  Activity Tolerance: Treatment limited secondary to medical complications (Comment) (Blood Pressure (182/80)) Patient left: in bed;with bed alarm set;with family/visitor present  OT Visit Diagnosis: Unsteadiness on feet (R26.81);Other abnormalities of gait and mobility (R26.89);Hemiplegia and hemiparesis;Other symptoms and signs involving the nervous system (R29.898);Muscle weakness (generalized) (M62.81) Hemiplegia - Right/Left:  Left Hemiplegia - dominant/non-dominant: Non-Dominant Hemiplegia - caused by: Cerebral infarction                Time: 1531-1601 OT Time Calculation (min): 30 min Charges:  OT General Charges $OT Visit: 1 Procedure OT Evaluation $OT Eval Moderate Complexity: 1 Procedure OT Treatments $Self Care/Home Management : 8-22 mins G-Codes: OT G-codes **NOT FOR INPATIENT CLASS** Functional Assessment Tool Used: AM-PAC 6 Clicks Daily Activity Functional Limitation: Self care Self Care Current Status (Z6109): At least 40 percent but less than 60 percent impaired, limited or restricted Self Care Goal Status (U0454): At least 20 percent but less than 40 percent impaired, limited or restricted   Sherryl Manges OTR/L [phone]  Evern Bio Tata Timmins 03/15/2017, 4:24 PM

## 2017-03-15 NOTE — Progress Notes (Signed)
STROKE TEAM PROGRESS NOTE   HISTORY OF PRESENT ILLNESS (per record) Geralynn Capri is an 71 y.o. female with a history of hypertension and stroke 3 weeks ago with left-sided weakness, who presented to the ED at [facility] with complaint of diplopia and headache in addition to continued left-sided weakness. Patient has been unable to ambulate since her stroke. She was [place] at the time, and has subsequently been brought to the [place] by her daughter for further management. She's been taking aspirin and Plavix daily. Patient started indicated that patient had not been getting therapy following her stroke. CT scan of her head showed an equivocal left basal ganglia infarction. MRI showed multiple areas of subacute infarction involving multiple vascular territories, indicative of occipital embolic source. No acute stroke was noted.  LSN: Unclear. tPA Given: No: Recent stroke mRankin:   SUBJECTIVE (INTERVAL HISTORY) Her son is at the bedside.  Pt has some disorientation and memory difficulty but she said that was because she was tired. She had a lot of tests today but she can not remember. Stated that she had arrhythmia long time ago in [place] but not having recently. She dose not know what type of arrhythmia.    OBJECTIVE Temp:  [97.9 F (36.6 C)-99.3 F (37.4 C)] 97.9 F (36.6 C) (03/24 0500) Pulse Rate:  [66-89] 81 (03/24 0650) Cardiac Rhythm: Normal sinus rhythm (03/24 0700) Resp:  [14-18] 18 (03/24 0300) BP: (122-204)/(52-115) 199/80 (03/24 0650) SpO2:  [98 %-100 %] 100 % (03/24 0500) Weight:  [63 kg (138 lb 14.2 oz)] 63 kg (138 lb 14.2 oz) (03/23 1920)  CBC:   Recent Labs Lab 03/14/17 1152  WBC 8.1  NEUTROABS 5.9  HGB 11.8*  HCT 34.8*  MCV 81.7  PLT 280    Basic Metabolic Panel:   Recent Labs Lab 03/14/17 1152  NA 137  K 4.0  CL 106  CO2 25  GLUCOSE 119*  BUN 31*  CREATININE 1.18*  CALCIUM 9.5    Lipid Panel:     Component Value Date/Time    CHOL 131 03/15/2017 0725   TRIG 175 (H) 03/15/2017 0725   HDL 34 (L) 03/15/2017 0725   CHOLHDL 3.9 03/15/2017 0725   VLDL 35 03/15/2017 0725   LDLCALC 62 03/15/2017 0725   HgbA1c: No results found for: HGBA1C Urine Drug Screen: No results found for: LABOPIA, COCAINSCRNUR, LABBENZ, AMPHETMU, THCU, LABBARB    IMAGING I have personally reviewed the radiological images below and agree with the radiology interpretations.  Ct Head Wo Contrast 03/14/2017 Cerebral atrophy, ventriculomegaly and periventricular white matter disease. Suspect lacunar type left basal ganglia infarcts. No hemispheric infarction or intracranial hemorrhage. Moderate vascular calcifications.   Mr Brain Wo Contrast 03/14/2017 Multiple subacute infarctions in multiple vascular territories, suggesting embolic disease from the heart or ascending aorta. There is involvement of the right thalamus, left parieto-occipital junction region and right frontal para median brain. Chronic small-vessel ischemic changes elsewhere as described. Atrophy. Ventricular prominence probably secondary to ex vacuo enlargement. Normal pressure hydrocephalus not excluded, but not specifically favored.   CTA head ane neck No significant carotid stenosis or proximal anterior circulation disease. 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 LEFT PCA occlusion. Subacute RIGHT frontal infarct shows mild postcontrast enhancement consistent with an insult a few weeks ago.  TTE - Left ventricle: The cavity size was  normal. Wall thickness was   normal. Systolic function was normal. The estimated ejection   fraction was in the range of 55% to 60%. Wall motion was normal;   there were no regional wall motion abnormalities. Doppler   parameters are consistent with  abnormal left ventricular   relaxation (grade 1 diastolic dysfunction). Doppler parameters   are consistent with high ventricular filling pressure. - Aortic valve: Valve area (VTI): 1.88 cm^2. Valve area (Vmax):   1.81 cm^2. Valve area (Vmean): 1.64 cm^2. - Mitral valve: Mildly calcified annulus. Normal thickness leaflets   . There was mild regurgitation. - Left atrium: The atrium was severely dilated. - Atrial septum: No defect or patent foramen ovale was identified. - Technically adequate study.  LE venous doppler - There is no DVT noted in the left lower extremity.  There is superficial thrombosis noted in the left lesser saphenous vein and in varicosities in the left calf.    PHYSICAL EXAM Temp:  [97.9 F (36.6 C)-99.3 F (37.4 C)] 98.2 F (36.8 C) (03/24 1438) Pulse Rate:  [66-93] 93 (03/24 1438) Resp:  [14-20] 20 (03/24 1438) BP: (122-204)/(52-115) 169/68 (03/24 1438) SpO2:  [99 %-100 %] 100 % (03/24 1438) Weight:  [138 lb 14.2 oz (63 kg)] 138 lb 14.2 oz (63 kg) (03/23 1920)  General - Well nourished, well developed, in no apparent distress.  Ophthalmologic - Fundi not visualized due to eye movement.  Cardiovascular - Regular rate and rhythm.  Mental Status -  Level of arousal and orientation to month and person were intact, however, not orientated to place and year. Language including expression, naming, repetition, comprehension was assessed and found intact, mild dysarthria. Recent and remote memory were intact impaired Fund of Knowledge was assessed and was impaired, can not tell me the current and past presidents.  Cranial Nerves II - XII - II - Visual field intact OU. III, IV, VI - Extraocular movements intact. V - Facial sensation intact bilaterally. VII - Facial movement intact bilaterally. VIII - Hearing & vestibular intact bilaterally. X - Palate elevates symmetrically. XI - Chin turning & shoulder shrug intact bilaterally. XII - Tongue protrusion  intact.  Motor Strength - [name] strength was normal in RUE and RLE, however, left UE 4-/5 and LLE 2/5 proximal with DF 3/5 distally and pronator drift was present.  Bulk was normal and fasciculations were absent.   Motor Tone - Muscle tone was assessed at the neck and appendages and was normal.  Reflexes - [name] reflexes were 1+ in all extremities and she had no pathological reflexes.  Sensory - Light touch, temperature/pinprick were assessed and were symmetrical.    Coordination - The patient had normal movements in the hands with no ataxia or dysmetria.  Tremor was absent.  Gait and Station - deferred   ASSESSMENT/PLAN Ms. Verita Lambrinda Norma Fredricksonoledo is a 71 y.o. female with history of hypertension and a stroke in [place] 3 weeks ago with subsequent left hemiparesis, presenting with diplopia and headache. She did not receive IV t-PA due to recent stroke history.   Strokes:  Left MCA, right thalamus, right ACA infarct, embolic pattern - source unclear.  Resultant  Disorientation, left hemiparesis  MRI - Multiple subacute infarctions including Left MCA, right thalamus, right ACA  suggesting embolic disease  CTA H&N - left PCA occlusion, b/l fetal PCA, BA hypoplastic  2D Echo - EF 55-60%  LE venous Dopplers - no DVT  Recommend TEE and loop recorder for cardioembolic work up -  pt does have hx of arrhythmia long time ago in [place]  LDL - 62  HgbA1c - pending  VTE prophylaxis - Lovenox Diet NPO time specified  aspirin 325 mg daily and clopidogrel 75 mg daily prior to admission, now on aspirin 325 mg daily and clopidogrel 75 mg daily.   Patient counseled to be compliant with her antithrombotic medications  Ongoing aggressive stroke risk factor management  Therapy recommendations: CIR  Disposition:  Pending  Hx of arrhythmia  Pt stated that she had arrhythmia long time ago, but not recently  Not sure about what type of arrhythmia  On verapmil but not sure  what the reason she is on it  Recommend TEE and loop recorder for cardioembolic work up  Hypertension  Blood pressure is running high  Permissive hypertension (OK if < 220/120) but gradually normalize in 5-7 days  Long-term BP goal normotensive  Hyperlipidemia  Home meds:  Zocor 20 mg daily resumed in hospital  LDL 62, goal < 70  Continue statin at discharge  Other Stroke Risk Factors  Advanced age  Other Active Problems  Mild anemia - 11.8 / 34.8  UTI - on rocephin   Hospital day # 0  Marvel Plan, MD PhD Stroke Neurology 03/15/2017 3:38 PM   To contact Stroke Continuity provider, please refer to [email or web address]. After hours, contact General Neurology

## 2017-03-15 NOTE — Evaluation (Signed)
Speech Language Pathology Evaluation Patient Details Name: Christina [name] MRN: [id number] DOB: [birth year] Today's Date: 03/15/2017 Time: [id number] SLP Time Calculation (min) (ACUTE ONLY): 25 min  Problem List:  Patient Active Problem List   Diagnosis Date Noted  . TIA (transient ischemic attack) 03/14/2017  . History of CVA (cerebrovascular accident) 03/14/2017  . HTN (hypertension) 03/14/2017  . HLD (hyperlipidemia) 03/14/2017  . 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 Hypertension   . Stroke [facility](HCC)    Past Surgical History:  Past Surgical History:  Procedure Laterality Date  . ABDOMINAL HYSTERECTOMY    . EYE SURGERY     HPI:  Pt is a 71 y/o female who presents with L-sided weakness, diplopia, and HA. 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 This was in [place]. D[place] the [place] the [place] continued [place] after she was not improving. MRI of brain revealed no acute changes. MRI 03/14/17 showed Multiple subacute infarctions in multiple vascular territories, suggesting embolic disease from the heart or ascending aorta. There is involvement of the right thalamus, left parieto-occipital junction region and right frontal para median brain. Passed nursing stroke swallow screen, referred for cognitive-linguistic evaluation.    Assessment / Plan / Recommendation Clinical Impression  Patient presents with cognitive communication impairment. Per patient's son, pt lived in [place] and has been "declining" which he noted during her last visit to the [place] approximately one year ago. Patient speaks Spanish as a first language, English 2nd. She declines an interpreter, though she does occasionally require interpretation of some questions from her son. Administered MMSE; patient scored 15/30, indicating severe impairment. While language barrier may account for some errors, suspect  underlying cognitive deficits in attention, memory are primary reason for poor testing performance. During testing, pt required extended processing time and frequent redirection to tasks. Delayed recall 0/3. Follows single step commands 80% accuracy, 25% accuracy for multi-step which appears to be cognitive vs. language-related. Provided education to pt and her son regarding cognitive changes after stroke; patient denies changes in thinking memory, with poor awareness of deficits. Recommending CIR; pt will likely need 24/7 supervision for safety given reduced awareness of deficits.      SLP Assessment  SLP Recommendation/Assessment: Patient needs continued Speech Lanaguage Pathology Services SLP Visit Diagnosis: Cognitive communication deficit (R41.841)    Follow Up Recommendations  Inpatient Rehab    Frequency and Duration min 2x/week  2 weeks      SLP Evaluation Cognition  Overall Cognitive Status: Impaired/Different from baseline Arousal/Alertness: Awake/alert Orientation Level: Oriented to person;Oriented to place;Disoriented to time;Disoriented to situation Attention: Focused;Sustained;Selective Focused Attention: Impaired Focused Attention Impairment: Verbal basic;Functional basic Sustained Attention: Impaired Sustained Attention Impairment: Verbal basic;Functional basic Selective Attention: Impaired Selective Attention Impairment: Verbal basic;Functional basic Memory: Impaired Memory Impairment: Decreased short term memory;Storage deficit;Decreased recall of new information Decreased Short Term Memory: Verbal basic;Functional basic Awareness: Impaired Awareness Impairment: Intellectual impairment Problem Solving: Impaired Problem Solving Impairment: Verbal basic;Functional basic Executive Function: Sequencing;Initiating Sequencing: Impaired Sequencing Impairment: Verbal basic;Functional basic Initiating: Impaired Initiating Impairment: Verbal basic;Functional  basic Safety/Judgment: Impaired       Comprehension  Auditory Comprehension Overall Auditory Comprehension: Impaired Yes/No Questions: Within Functional Limits Commands: Impaired One Step Basic Commands: 75-100% accurate Two Step Basic Commands: 50-74% accurate Multistep Basic Commands: 25-49% accurate Conversation: Simple Interfering Components: Attention;Processing speed (Language barrier) EffectiveTechniques: Repetition;Slowed speech;Extra processing time Visual  Recognition/Discrimination Discrimination: Not tested Reading Comprehension Reading Status: Within funtional limits    Expression Expression Primary Mode of Expression: Verbal Verbal Expression Overall Verbal Expression: Appears within functional limits for tasks assessed Initiation: Impaired Automatic Speech: Social Response;Name Level of Generative/Spontaneous Verbalization: Sentence Repetition: Impaired Level of Impairment: Sentence level Naming: Not tested Pragmatics: Impairment Impairments: Eye contact;Topic maintenance;Topic appropriateness Interfering Components: Attention Effective Techniques: Open ended questions Non-Verbal Means of Communication: Not applicable Written Expression Dominant Hand: Right Written Expression: Not tested   Oral / Motor  Oral Motor/Sensory Function Overall Oral Motor/Sensory Function: Within functional limits Motor Speech Overall Motor Speech: Appears within functional limits for tasks assessed Respiration: Within functional limits Phonation: Normal Resonance: Within functional limits Articulation: Within functional limitis Intelligibility: Intelligible Motor Planning: Witnin functional limits Motor Speech Errors: Not applicable   GO          Functional Assessment Tool Used: skilled clinical judgment Functional Limitations: Attention Attention Current Status (W0981): At least 40 percent but less than 60 percent impaired, limited or restricted Attention Goal Status  (X9147): At least 20 percent but less than 40 percent impaired, limited or restricted         Arlana Lindau 03/15/2017, 1:51 PM   Rondel Baton, MS CF-SLP Speech-Language Pathologist [phone]

## 2017-03-15 NOTE — Evaluation (Signed)
Physical Therapy Evaluation Patient Details Name: Christina [name] MRN: [id number] DOB: [birth year] Today's Date: 03/15/2017   History of Present Illness  Pt is a 71 y/o female who presents with L-sided weakness, diplopia, and HA. 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 This was in [place] Daughter brought the pt to the [place] for continued care after she was not improving. MRI of brain revealed no acute changes.   Clinical Impression  Pt admitted with above diagnosis. Pt currently with functional limitations due to the deficits listed below (see PT Problem List). Discussed pt case with MD who has concern for LE DVT and advised no OOB at this time, however gave permission for EOB evaluation. Will assess transfers and ambulation as able next session. Pt demonstrated need for mod assist during basic bed mobility tasks. Impairments were inconsistent at times, and unsure if this is due to language barrier. Pt is staying with daughter in [place] currently as she was not able to get the care family felt she needed in [place] Feel CIR may be a good option for this patient to maximize functional independence prior to return home with daughter. Pt will benefit from skilled PT to increase their independence and safety with mobility to allow discharge to the venue listed below.       Follow Up Recommendations CIR;Supervision/Assistance - 24 hour    Equipment Recommendations  Rolling walker with 5" wheels;3in1 (PT)    Recommendations for Other Services Rehab consult     Precautions / Restrictions Precautions Precautions: Fall Precaution Comments: Inconsistent impairments throughout session. Restrictions Weight Bearing Restrictions: No      Mobility  Bed Mobility Overal bed mobility: Needs Assistance Bed Mobility: Rolling;Sidelying to Sit;Sit to Sidelying Rolling: Mod assist Sidelying to sit: Mod assist     Sit to sidelying: Mod assist General bed  mobility comments: Pt inconsistently following commands throughout bed mobility. Hand-over-hand assist was provided for pt to reach for the railing with her LUE, however pt able to hold tightly to the rail and once she could reach it. Bed pads used for assist to scoot around fully to EOB. At end of session pt resisting therapist when attempting to lay back down, but was eventually able to complete with daughter's assist to guide her shoulders down and therapist elevating LE's up into bed. +2 required with RN to pull pt up in bed.   Transfers                 General transfer comment: OOB mobility deferred per Dr. Waymon Amato as pt is going for LE doppler to r/o DVT.   Ambulation/Gait                Stairs            Wheelchair Mobility    Modified Rankin (Stroke Patients Only) Modified Rankin (Stroke Patients Only) Pre-Morbid Rankin Score: No symptoms Modified Rankin: Severe disability     Balance Overall balance assessment: Needs assistance Sitting-balance support: Bilateral upper extremity supported;Feet supported Sitting balance-Leahy Scale: Poor Sitting balance - Comments: Pt able to sit with hands in lap, however noted L lateral lean Postural control: Left lateral lean                                   Pertinent Vitals/Pain Pain Assessment: Faces Pain Score: 10-Worst pain ever Faces Pain Scale: Hurts  little more Pain Location: L knee - pt reports 10/10 however looks very calm and in no apparent distress at rest.  Pain Descriptors / Indicators: Grimacing;Guarding Pain Intervention(s): Monitored during session;Limited activity within patient's tolerance;Repositioned    Home Living Family/patient expects to be discharged to:: Private residence Living Arrangements: Children Available Help at Discharge: Family;Available PRN/intermittently Type of Home: House Home Access: Level entry     Home Layout: One level Home Equipment: Wheelchair -  manual      Prior Function Level of Independence: Needs assistance   Gait / Transfers Assistance Needed: Daughter reports that she has been doing ~75% of the work when assisting her. Could take a few steps from bed to w/c and then from w/c to toilet.   ADL's / Homemaking Assistance Needed: Pt able to assist minimally with sponge bathing and self-care but daughter reports she has been doing most of it for her since her stroke 3 weeks ago        Hand Dominance   Dominant Hand: Right    Extremity/Trunk Assessment   Upper Extremity Assessment Upper Extremity Assessment: LUE deficits/detail LUE Deficits / Details: Pt reports that she cannot move her LUE "at all" however demonstrated fair grip strength and the ability to resist flexion and extension at the shoulder and elbow. Was able to weightbear through LUE in sitting and prop herself onto the L hand for balance support, however when asked to actively move the L hand from the bed to her lap, she states she cannot move the arm on her own.     Lower Extremity Assessment Lower Extremity Assessment: LLE deficits/detail LLE Deficits / Details: Pt reports she is not able to move the LLE "at all", however demonstrated some active ankle DF/PF, and knee flexion in supine. In sitting, pt able to somewhat extend the L knee (very inconsistent with how much she is able to do actively). Unclear whether pain in the L knee was limiting her or if it was weakness.    Cervical / Trunk Assessment Cervical / Trunk Assessment: Other exceptions Cervical / Trunk Exceptions: Forward head/rounded shoulder posture  Communication   Communication: Prefers language other than English (Dtr helped interpret even though pt declined interpreter)  Cognition Arousal/Alertness: Awake/alert Behavior During Therapy: WFL for tasks assessed/performed Overall Cognitive Status: Impaired/Different from baseline Area of Impairment: Following commands;Safety/judgement;Problem  solving                       Following Commands: Follows one step commands with increased time;Follows one step commands inconsistently Safety/Judgement: Decreased awareness of safety;Decreased awareness of deficits   Problem Solving: Slow processing;Decreased initiation;Difficulty sequencing;Requires verbal cues General Comments: Pt with increased processing time required. At times, pt appeared to be thinking about what the therapist asked her to do but then asked a completely unrelated question or changed the subject. Difficult to tell whether there was a language barrier or if it is altered cognition. Pt reports that she does not need an interpreter and that her English is very good as she lived in [place] for several decades. However, pt looked to daughter to interpret for her several times during session.       General Comments      Exercises     Assessment/Plan    PT Assessment Patient needs continued PT services  PT Problem List Decreased strength;Decreased range of motion;Decreased activity tolerance;Decreased balance;Decreased mobility;Decreased knowledge of use of DME;Decreased safety awareness;Decreased coordination;Decreased cognition;Decreased knowledge of precautions;Pain  PT Treatment Interventions DME instruction;Gait training;Stair training;Functional mobility training;Therapeutic activities;Therapeutic exercise;Neuromuscular re-education;Patient/family education    PT Goals (Current goals can be found in the Care Plan section)  Acute Rehab PT Goals Patient Stated Goal: Get better PT Goal Formulation: With patient/family Time For Goal Achievement: 03/29/17 Potential to Achieve Goals: Good    Frequency Min 4X/week   Barriers to discharge        Co-evaluation               End of Session   Activity Tolerance: Patient limited by fatigue;Treatment limited secondary to medical complications (Comment) (OOB deferred per MD) Patient left: in  bed;with call bell/phone within reach;with chair alarm set;with family/visitor present Nurse Communication: Mobility status PT Visit Diagnosis: Pain;Hemiplegia and hemiparesis Hemiplegia - Right/Left: Left Hemiplegia - dominant/non-dominant: Non-dominant Hemiplegia - caused by: Cerebral infarction Pain - Right/Left: Left Pain - part of body: Knee    Time: [id number] PT Time Calculation (min) (ACUTE ONLY): 26 min   Charges:   PT Evaluation $PT Eval Moderate Complexity: 1 Procedure PT Treatments $Therapeutic Activity: 8-22 mins   PT G Codes:   PT G-Codes **NOT FOR INPATIENT CLASS** Functional Assessment Tool Used: AM-PAC 6 Clicks Basic Mobility Functional Limitation: Mobility: Walking and moving around Mobility: Walking and Moving Around Current Status (V4098(G8978): At least 60 percent but less than 80 percent impaired, limited or restricted Mobility: Walking and Moving Around Goal Status [phone](G8979): At least 40 percent but less than 60 percent impaired, limited or restricted    Conni SlipperLaura Lowell Makara, PT, DPT Acute Rehabilitation Services Pager: [phone]   Marylynn PearsonLaura D Halil Rentz 03/15/2017, 9:55 AM   Conni SlipperLaura Marqueze Ramcharan, PT, DPT Acute Rehabilitation Services Pager: [phone]

## 2017-03-15 NOTE — Progress Notes (Signed)
Echocardiogram 2D Echocardiogram has been performed.  Christina [name] 03/15/2017, 10:23 AM

## 2017-03-15 NOTE — Progress Notes (Signed)
PROGRESS NOTE   Christina [name]  UJW:[id number]    DOB: [birth year]    DOA: 03/14/2017  PCP: Pcp Not In System   I have briefly reviewed patients previous medical records in [facility].  Brief Narrative:  71 year old female, lives in [place] recent [place] on 02/27/17 for 5 days with CVA and left hemiplegia, HTN, former tobacco abuse, presented to ED with complaints of headache, double vision and left-sided body pains. History provided by patient and daughter at bedside. Daughter indicates that during the latter part of hospitalization, she traveled from [place] to [place] to assist her mother. Mother was discharged from the hospital without any home health services and she was doing the best she can with physical therapy. Patient developed bedsores but healed with TLC. However she continued to be confused and complained of headache and left-sided body pain. They then traveled to the [place] and came to the ED the same day when there landed in [place]. CT head confirmed suspected lacunar type left basilar ganglia infarcts without hemorrhage. Neurology was consulted.   Assessment & Plan:   Active Problems:   TIA (transient ischemic attack)   History of CVA (cerebrovascular accident)   HTN (hypertension)   HLD (hyperlipidemia)   Tobacco abuse, in remission   Strokes:  Left MCA, right thalamus, right ACA infarct, embolic pattern - source unclear.  Resultant  Disorientation, left hemiparesis. Disorientation resolved. Left hemiparesis improving.  MRI - Multiple subacute infarctions including Left MCA, right thalamus, right ACA  suggesting embolic disease  CTA H&N - left PCA occlusion, b/l fetal PCA, BA hypoplastic  2D Echo - EF 55-60%  Left lower extremity venous Doppler: No DVT noted. There is a superficial thrombosis noted in the left lesser saphenous vein and an varicosities in the left calf.  Neurology recommended TEE and loop recorder for  cardioembolic work up - pt does have hx of arrhythmia long time ago in [place]  LDL - 62  HgbA1c - pending  aspirin 325 mg daily and clopidogrel 75 mg daily prior to admission, now on aspirin 325 mg daily and clopidogrel 75 mg daily.   Patient counseled to be compliant with her antithrombotic medications  Ongoing aggressive stroke risk factor management  Therapy recommendations: CIR  Disposition:  Pending  Hx of arrhythmia  Pt stated that she had arrhythmia long time ago, but not recently  Not sure about what type of arrhythmia  On verapmil but not sure what the reason she is on it  Neurology recommended TEE and loop recorder.  Sinus rhythm on monitor.  Hypertension  Blood pressure is running high              Permissive hypertension (OK if < 220/120) but gradually normalize in 5-7 days              Long-term BP goal normotensive  Hyperlipidemia  Home meds:  Zocor 20 mg daily resumed in hospital  LDL 62, goal < 70  Continue statin at discharge  Acute cystitis - Complete 3 days course of antibiotics he did currently on ceftriaxone. Urine culture not helpful.  Left knee/leg pain - ? Related to stroke/neuropathic. Lower extremity venous Doppler negative. Supportive treatment.  Anemia Follow CBCs.    DVT prophylaxis: Lovenox Code Status: Full Family Communication: Discussed in detail with patient's daughter at bedside. Disposition: To be determined  Consultants:  Neurology   Procedures:  2-D echo 03/15/17: Study Conclusions  - Left ventricle: The cavity  size was normal. Wall thickness was   normal. Systolic function was normal. The estimated ejection   fraction was in the range of 55% to 60%. Wall motion was normal;   there were no regional wall motion abnormalities. Doppler   parameters are consistent with abnormal left ventricular   relaxation (grade 1 diastolic dysfunction). Doppler parameters   are consistent with high ventricular filling  pressure. - Aortic valve: Valve area (VTI): 1.88 cm^2. Valve area (Vmax):   1.81 cm^2. Valve area (Vmean): 1.64 cm^2. - Mitral valve: Mildly calcified annulus. Normal thickness leaflets   . There was mild regurgitation. - Left atrium: The atrium was severely dilated. - Atrial septum: No defect or patent foramen ovale was identified. - Technically adequate study.  Antimicrobials:  Ceftriaxone   Subjective:  Feels much better. Complaints of mild pain in her left knee. As per daughter, confusion has resolved. Left-sided strength improving.  ROS:  No chest pain or visual symptoms reported.  Objective:  Vitals:   03/15/17 0506 03/15/17 0650 03/15/17 1438 03/15/17 1805  BP: (!) 204/81 (!) 199/80 (!) 169/68 (!) 210/99  Pulse: 78 81 93 92  Resp:   20 20  Temp:   98.2 F (36.8 C) 98.1 F (36.7 C)  TempSrc:   Oral Oral  SpO2:   100% 100%  Weight:      Height:        Examination:  General exam: Pleasant elderly female lying comfortably propped up in bed. Respiratory system: Clear to auscultation. Respiratory effort normal. Cardiovascular system: S1 & S2 heard, RRR. No JVD, murmurs, rubs, gallops or clicks. No pedal edema. telemetry: Sinus rhythm. Gastrointestinal system: Abdomen is nondistended, soft and nontender. No organomegaly or masses felt. Normal bowel sounds heard. Central nervous system: Alert and oriented. No focal neurological deficits. Extremities: 5 x 5 power in right limbs. 4+ by 5 power in left upper extremity. 2 x 5 power in left lower extremity. Mild warmth of left knee and leg without tenderness or features suggestive of cellulitis. Skin: No rashes, lesions or ulcers Psychiatry: Judgement and insight appear normal. Mood & affect appropriate.     Data Reviewed: I have personally reviewed following labs and imaging studies  CBC:  Recent Labs Lab 03/14/17 1152  WBC 8.1  NEUTROABS 5.9  HGB 11.8*  HCT 34.8*  MCV 81.7  PLT 280   Basic Metabolic  Panel:  Recent Labs Lab 03/14/17 1152  NA 137  K 4.0  CL 106  CO2 25  GLUCOSE 119*  BUN 31*  CREATININE 1.18*  CALCIUM 9.5   Liver Function Tests:  Recent Labs Lab 03/14/17 1152  AST 22  ALT 21  ALKPHOS 63  BILITOT 0.5  PROT 7.1  ALBUMIN 3.3*   Coagulation Profile:  Recent Labs Lab 03/14/17 1152  INR 1.13   Cardiac Enzymes: No results for input(s): CKTOTAL, CKMB, CKMBINDEX, TROPONINI in the last 168 hours. HbA1C: No results for input(s): HGBA1C in the last 72 hours. CBG: No results for input(s): GLUCAP in the last 168 hours.  Recent Results (from the past 240 hour(s))  Urine culture     Status: Abnormal (Preliminary result)   Collection Time: 03/14/17  1:06 PM  Result Value Ref Range Status   Specimen Description URINE, CATHETERIZED  Final   Special Requests NONE  Final   Culture (A)  Final    40,000 COLONIES/mL GRAM NEGATIVE RODS CULTURE REINCUBATED FOR BETTER GROWTH Performed at [facility], [address] [place], [place]  [phone]    Report Status PENDING  Incomplete         Radiology Studies: Ct Angio Head W Or Wo Contrast  Result Date: 03/15/2017 CLINICAL DATA:  LEFT-sided weakness. History of cerebral infarction 3 weeks ago. MRI shows multiple vascular territories involved. EXAM: CT ANGIOGRAPHY HEAD AND NECK TECHNIQUE: Multidetector CT imaging of the head and neck was performed using the standard protocol during bolus administration of intravenous contrast. Multiplanar CT image reconstructions and MIPs were obtained to evaluate the vascular anatomy. Carotid stenosis measurements (when applicable) are obtained utilizing NASCET criteria, using the distal internal carotid diameter as the denominator. CONTRAST:  Isovue 370, 50 mL. COMPARISON:  MR brain 03/14/2017.  CT head 03/14/2017. FINDINGS: CT HEAD Calvarium and skull base: No fracture or destructive lesion. Mastoids and middle ears are grossly clear. Paranasal sinuses: Imaged portions are  clear. Orbits: Negative. Brain: The multifocal RIGHT frontal, LEFT occipital, and RIGHT thalamus subacute infarcts are better seen on MR. There is global atrophy with chronic microvascular ischemic change. No hemorrhage is present. Chronic LEFT cerebellar infarct redemonstrated. CTA NECK Aortic arch: Standard branching. Imaged portion shows no evidence of aneurysm or dissection. No significant stenosis of the major arch vessel origins although widespread calcifications present along with some soft plaque involving not only the vessel origins both transverse arch. Right carotid system: Advanced dolichoectasia. Predominantly calcific plaque along the distal common carotid artery, and at the bifurcation but no measurable stenosis. No evidence of dissection, stenosis (50% or greater) or occlusion. Left carotid system: Advanced dolichoectasia. Predominantly calcific plaque along the distal common carotid artery and at the bifurcation, but no measurable stenosis. No evidence of dissection, stenosis (50% or greater) or occlusion. Vertebral arteries: RIGHT vertebral is dominant/patent from its origin to the basilar confluence. LEFT vertebral is occluded just above its origin, and is not patent in the neck. Nonvascular soft tissues: Cervical spondylosis. No lung lesions. Normal thyroid. CTA HEAD Anterior circulation: Calcific plaque in both cavernous carotid arteries. Possible 50% stenosis on the LEFT. No proximal anterior or middle cerebral artery stenosis is evident. No MCA branch occlusion. Both distal anterior cerebral arteries patent. No aneurysm, or vascular malformation. Posterior circulation: The basilar artery is diffusely small, which likely relates to hypoplasia from BILATERAL fetal PCA origins. The basilar is also mildly irregular, and superimposed atherosclerotic disease is suspected. The RIGHT PCA is widely patent. The LEFT PCA is severely diseased proximally, and does not significantly fill in its P2 or P3  segments. No visible cerebellar branch occlusion. Probable retrograde flow in the LEFT vertebral to supply the PICA. No aneurysm, or vascular malformation. Venous sinuses: As permitted by contrast timing, patent. Anatomic variants: BILATERAL fetal PCA. Delayed phase: Gyriform enhancement of the medial/superior subacute RIGHT frontal infarct reflects its subacute nature. Similar more subtle findings in the LEFT occipital cortex. Review of the MIP images confirms the above findings IMPRESSION: No significant carotid stenosis or proximal anterior circulation disease. 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 LEFT PCA occlusion. Subacute RIGHT frontal infarct shows mild postcontrast enhancement consistent with an insult a few weeks ago. Electronically Signed   By: Elsie StainJohn T Curnes M.D.   On: 03/15/2017 13:59   Ct Head Wo Contrast  Result Date: 03/14/2017 CLINICAL DATA:  CVA 3 weeks ago.  Persistent weakness and confusion. EXAM: CT HEAD WITHOUT CONTRAST TECHNIQUE: Contiguous axial images were obtained from  the base of the skull through the vertex without intravenous contrast. COMPARISON:  None. FINDINGS: Brain: Age related cerebral atrophy, ventriculomegaly and periventricular white matter disease. Probable small lacunar type external capsule lacunar type infarcts on the left. No extra-axial fluid collections are identified. No CT findings for acute hemispheric infarction or intracranial hemorrhage. No mass lesions. The brainstem and cerebellum are normal. Vascular: No hyperdense vessels or definite aneurysm. Moderate vascular calcifications. Skull: No skull fracture or bone lesion. Sinuses/Orbits: The paranasal sinuses and mastoid air cells are clear. The globes are intact. Other: No scalp lesions. IMPRESSION: Cerebral atrophy,  ventriculomegaly and periventricular white matter disease. Suspect lacunar type left basal ganglia infarcts. No hemispheric infarction or intracranial hemorrhage. Moderate vascular calcifications. Electronically Signed   By: Rudie Meyer M.D.   On: 03/14/2017 13:35   Ct Angio Neck W Or Wo Contrast  Result Date: 03/15/2017 CLINICAL DATA:  LEFT-sided weakness. History of cerebral infarction 3 weeks ago. MRI shows multiple vascular territories involved. EXAM: CT ANGIOGRAPHY HEAD AND NECK TECHNIQUE: Multidetector CT imaging of the head and neck was performed using the standard protocol during bolus administration of intravenous contrast. Multiplanar CT image reconstructions and MIPs were obtained to evaluate the vascular anatomy. Carotid stenosis measurements (when applicable) are obtained utilizing NASCET criteria, using the distal internal carotid diameter as the denominator. CONTRAST:  Isovue 370, 50 mL. COMPARISON:  MR brain 03/14/2017.  CT head 03/14/2017. FINDINGS: CT HEAD Calvarium and skull base: No fracture or destructive lesion. Mastoids and middle ears are grossly clear. Paranasal sinuses: Imaged portions are clear. Orbits: Negative. Brain: The multifocal RIGHT frontal, LEFT occipital, and RIGHT thalamus subacute infarcts are better seen on MR. There is global atrophy with chronic microvascular ischemic change. No hemorrhage is present. Chronic LEFT cerebellar infarct redemonstrated. CTA NECK Aortic arch: Standard branching. Imaged portion shows no evidence of aneurysm or dissection. No significant stenosis of the major arch vessel origins although widespread calcifications present along with some soft plaque involving not only the vessel origins both transverse arch. Right carotid system: Advanced dolichoectasia. Predominantly calcific plaque along the distal common carotid artery, and at the bifurcation but no measurable stenosis. No evidence of dissection, stenosis (50% or greater) or occlusion. Left  carotid system: Advanced dolichoectasia. Predominantly calcific plaque along the distal common carotid artery and at the bifurcation, but no measurable stenosis. No evidence of dissection, stenosis (50% or greater) or occlusion. Vertebral arteries: RIGHT vertebral is dominant/patent from its origin to the basilar confluence. LEFT vertebral is occluded just above its origin, and is not patent in the neck. Nonvascular soft tissues: Cervical spondylosis. No lung lesions. Normal thyroid. CTA HEAD Anterior circulation: Calcific plaque in both cavernous carotid arteries. Possible 50% stenosis on the LEFT. No proximal anterior or middle cerebral artery stenosis is evident. No MCA branch occlusion. Both distal anterior cerebral arteries patent. No aneurysm, or vascular malformation. Posterior circulation: The basilar artery is diffusely small, which likely relates to hypoplasia from BILATERAL fetal PCA origins. The basilar is also mildly irregular, and superimposed atherosclerotic disease is suspected. The RIGHT PCA is widely patent. The LEFT PCA is severely diseased proximally, and does not significantly fill in its P2 or P3 segments. No visible cerebellar branch occlusion. Probable retrograde flow in the LEFT vertebral to supply the PICA. No aneurysm, or vascular malformation. Venous sinuses: As permitted by contrast timing, patent. Anatomic variants: BILATERAL fetal PCA. Delayed phase: Gyriform enhancement of the medial/superior subacute RIGHT frontal infarct reflects its subacute nature. Similar more subtle findings in  the LEFT occipital cortex. Review of the MIP images confirms the above findings IMPRESSION: No significant carotid stenosis or proximal anterior circulation disease. 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 LEFT PCA  occlusion. Subacute RIGHT frontal infarct shows mild postcontrast enhancement consistent with an insult a few weeks ago. Electronically Signed   By: Elsie Stain M.D.   On: 03/15/2017 13:59   Mr Brain Wo Contrast  Result Date: 03/14/2017 CLINICAL DATA:  Stroke sustained 3 weeks ago in [place] Unable to walk. Confusion. EXAM: MRI HEAD WITHOUT CONTRAST TECHNIQUE: Multiplanar, multiecho pulse sequences of the brain and surrounding structures were obtained without intravenous contrast. COMPARISON:  CT same day FINDINGS: Brain: Diffusion imaging shows multiple vascular distributions subacute infarctions consistent with embolic disease from the heart or ascending aorta. There is acute infarction in the right thalamus, the left parieto-occipital junction cortical and subcortical brain and the right para median frontal brain. No evidence hemorrhage or mass effect. The brainstem is unremarkable. There are a few old small vessel cerebellar infarctions. Cerebral hemispheres otherwise show atrophy with chronic small-vessel ischemic changes of the deep and subcortical white matter and old lacunar infarctions in the basal ganglia. No evidence of neoplastic mass lesion. Ventricles are prominent, probably secondary to atrophy. Normal pressure hydrocephalus not excluded but not specifically suggested. Vascular: Major vessels at the base of the brain show flow. 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 There is involvement of the right thalamus, left parieto-occipital junction region and right frontal para median brain. Chronic small-vessel ischemic changes elsewhere as described. Atrophy. Ventricular prominence probably secondary to ex vacuo enlargement. Normal pressure hydrocephalus not excluded, but not specifically favored. Electronically Signed    By: Paulina Fusi M.D.   On: 03/14/2017 15:12        Scheduled Meds: . aspirin  325 mg Oral Daily  . cefTRIAXone (ROCEPHIN)  IV  1 g Intravenous Q24H  . clopidogrel  75 mg Oral Daily  . enoxaparin (LOVENOX) injection  40 mg Subcutaneous Q24H  . latanoprost  1 drop Both Eyes [id number]  . simvastatin  20 mg Oral Daily  . temazepam  15 mg Oral QHS   Continuous Infusions:   LOS: 0 days     Chandlor Noecker, MD, FACP, FHM. [facility] Pager [phone]  If 7PM-7AM, please contact night-coverage [email or web address] Password [email or web address] 03/15/2017, 6:50 PM

## 2017-03-16 ENCOUNTER — Inpatient Hospital Stay (HOSPITAL_COMMUNITY): Payer: Medicaid Other

## 2017-03-16 DIAGNOSIS — N3000 Acute cystitis without hematuria: Secondary | ICD-10-CM

## 2017-03-16 DIAGNOSIS — I639 Cerebral infarction, unspecified: Secondary | ICD-10-CM

## 2017-03-16 DIAGNOSIS — M25562 Pain in left knee: Secondary | ICD-10-CM

## 2017-03-16 LAB — HEMOGLOBIN A1C
HEMOGLOBIN A1C: 5.7 % — AB (ref 4.8–5.6)
Mean Plasma Glucose: 117 mg/dL

## 2017-03-16 MED ORDER — CARVEDILOL 6.25 MG PO TABS
6.2500 mg | ORAL_TABLET | Freq: Two times a day (BID) | ORAL | Status: DC
  Administered 2017-03-16: 6.25 mg via ORAL
  Filled 2017-03-16: qty 1

## 2017-03-16 NOTE — Progress Notes (Signed)
    [facility] has been requested to perform a transesophageal echocardiogram on Ms. Ironside for stoke.  After careful review of history and examination, the risks and benefits of transesophageal echocardiogram have been explained including risks of esophageal damage, perforation (1:10,000 risk), bleeding, pharyngeal hematoma as well as other potential complications associated with conscious sedation including aspiration, arrhythmia, respiratory failure and death. Alternatives to treatment were discussed, questions were answered. Patient is willing to proceed.   NPO after midnight. Meds with sips. Will try to arrange tomorrow. Needs order once know time and date.    Viviann Broyles, PA-C 03/16/2017 7:47 AM

## 2017-03-16 NOTE — Progress Notes (Signed)
qPhysical Therapy Treatment Patient Details Name: Christina [name] MRN: [id number] DOB: 11-Nov-1946 Today's Date: 03/16/2017    History of Present Illness Pt is a 71 y/o female who presents with L-sided weakness, diplopia, and HA. 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 This was [place] Daughter brought the pt to the [place] for continued care after she was not improving. MRI of brain revealed no acute changes.     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   Follow Up Recommendations  CIR;Supervision/Assistance - 24 hour     Equipment Recommendations  Rolling walker with 5" wheels;3in1 (PT)    Recommendations for Other Services Rehab consult     Precautions / Restrictions Precautions Precautions: Fall    Mobility  Bed Mobility Overal bed mobility: Needs Assistance Bed Mobility: Sidelying to Sit   Sidelying to sit: Mod assist       General bed mobility comments: Cues for technique; Heavy mod assist to elevate trunk to sit; manual contacts at pelvis and shoulder to faciliate shifting weight onto R pelvis/ischial tuberosity as she stalled in sitting in a heavy L lean  Transfers Overall transfer level: Needs assistance Equipment used: 2 person hand held assist;Rolling walker (2 wheeled) Transfers: Sit to/from [employer] Sit to Stand: Mod assist Stand pivot transfers: Max assist       General transfer comment: Cues for hand placement, forward weight shift, and technique; heavy mod assist to power up with close guard of L knee; Max assist to transfer to chair as the L knee did buckle; performed 3  reps of sit to stand: with one person assist, to RW and to sink  Ambulation/Gait Ambulation/Gait assistance: +2 physical assistance;Mod assist Ambulation Distance (Feet):  (2 steps forward, chair pulled behind) Assistive device: Rolling walker (2 wheeled) Gait Pattern/deviations: Step-to pattern;Decreased stance time - left;Decreased step length - left (Heavy L lean)     General Gait Details: Heavy L lean, requiring heavy mod assist to support while advancing R foot; mod assist to advance L foot as well; difficulty maintianing fully upright standing   Stairs            Wheelchair Mobility    Modified Rankin (Stroke Patients Only) Modified Rankin (Stroke Patients Only) Pre-Morbid Rankin Score: No symptoms Modified Rankin: Severe disability     Balance Overall balance assessment: Needs assistance Sitting-balance support: Bilateral upper extremity supported;Feet supported Sitting balance-Leahy Scale: Poor Sitting balance - Comments: Pt able to sit with hands in lap, however noted L lateral lean; 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 Postural control: Left lateral lean Standing balance support: Bilateral upper extremity supported Standing balance-Leahy Scale: Zero Standing balance comment: L lateral lean persists in standing; Stood at sink for support from Tax [employer]; near max assist with support on L to maintian standing; worked on reaching activities to facilitate more weight shift to the right                            Cognition Arousal/Alertness: Awake/alert Behavior During Therapy: WFL for tasks assessed/performed Overall Cognitive Status:  Impaired/Different from baseline Area of Impairment: Following commands;Safety/judgement;Problem solving                       Following Commands: Follows one step commands with increased time;Follows one step commands inconsistently Safety/Judgement: Decreased  awareness of safety;Decreased awareness of deficits   Problem Solving: Slow processing;Decreased initiation;Difficulty sequencing;Requires verbal cues General Comments: Emotionally labile during session; discussed living in [place], and became tearfull when talking about moving to [place]      Exercises      General Comments        Pertinent Vitals/Pain Pain Assessment: 0-10 Pain Score: 10-Worst pain ever Faces Pain Scale: Hurts little more Pain Location: L knee Pain Descriptors / Indicators: Discomfort Pain Intervention(s): Monitored during session;Premedicated before session    Home Living                      Prior Function            PT Goals (current goals can now be found in the care plan section) Acute Rehab PT Goals Patient Stated Goal: Get better and more independent PT Goal Formulation: With patient/family Time For Goal Achievement: 03/29/17 Potential to Achieve Goals: Good Progress towards PT goals: Progressing toward goals    Frequency    Min 4X/week      PT Plan Current plan remains appropriate    Co-evaluation             End of Session Equipment Utilized During Treatment: Gait belt Activity Tolerance: Patient tolerated treatment well Patient left: in chair;with call bell/phone within reach;with chair alarm set;Other (comment) (beginning breakfast) Nurse Communication: Mobility status PT Visit Diagnosis: Pain;Hemiplegia and hemiparesis Hemiplegia - Right/Left: Left Hemiplegia - dominant/non-dominant: Non-dominant Hemiplegia - caused by: Cerebral infarction Pain - Right/Left: Left Pain - part of body: Knee     Time: [id number] PT Time Calculation (min) (ACUTE ONLY): 35 min  Charges:  $Gait Training: 8-22 mins $Therapeutic Activity: 8-22 mins                    G Codes:       Van ClinesHolly Ketrick Matney, PT  Acute Rehabilitation Services Pager [phone] Office [phone]    Levi AlandHolly H Rivers Gassmann 03/16/2017, 9:44 AM

## 2017-03-16 NOTE — Progress Notes (Signed)
PROGRESS NOTE   Christina [name]  ZOX:[id number]    DOB: 12-04-1946    DOA: 03/14/2017  PCP: Pcp Not In System   I have briefly reviewed patients previous medical records in [facility].  Brief Narrative:  71 year old female, lives in [place] recent [place] on 02/27/17 for 5 days with CVA and left hemiplegia, HTN, former tobacco abuse, presented to ED with complaints of headache, double vision and left-sided body pains. History provided by patient and daughter at bedside. Daughter indicates that during the latter part of hospitalization, she traveled from [place] to [place] to assist her mother. Mother was discharged from the hospital without any home health services and she was doing the best she can with physical therapy. Patient developed bedsores but healed with TLC. However she continued to be confused and complained of headache and left-sided body pain. They then traveled to the [place] and came to the ED the same day when there landed in [place]. CT head confirmed suspected lacunar type left basilar ganglia infarcts without hemorrhage. Neurology was consulted. Stroke workup ongoing. Possible TEE 03/17/17. CIR consulted.   Assessment & Plan:   Active Problems:   TIA (transient ischemic attack)   History of CVA (cerebrovascular accident)   HTN (hypertension)   HLD (hyperlipidemia)   Tobacco abuse, in remission   Strokes:  Left MCA, right thalamus, right ACA infarct, embolic pattern - source unclear.  Resultant  Disorientation, left hemiparesis. Disorientation resolved. Left hemiparesis improving.  MRI - Multiple subacute infarctions including Left MCA, right thalamus, right ACA  suggesting embolic disease  CTA H&N - left PCA occlusion, b/l fetal PCA, BA hypoplastic, occluded left vertebral artery  2D Echo - EF 55-60%  Left lower extremity venous Doppler: No DVT noted. There is a superficial thrombosis noted in the left lesser saphenous vein and  several thrombosed varicosities in the left calf.  Neurology recommended TEE and loop recorder for cardioembolic work up - pt does have hx of arrhythmia long time ago in [place] Cardiology plans TEE 3/26. Telemetry: Sinus rhythm. Transient 5 beat nonsustained SVT.  LDL - 62  HgbA1c - pending  aspirin 325 mg daily and clopidogrel 75 mg daily prior to admission, now on aspirin 325 mg daily and clopidogrel 75 mg daily.   Patient counseled to be compliant with her antithrombotic medications  Ongoing aggressive stroke risk factor management  Therapy recommendations: CIR-consulted  Disposition:  Pending  Hx of arrhythmia  Pt stated that she had arrhythmia long time ago, but not recently  Not sure about what type of arrhythmia. Patient was on carvedilol, verapamil PTA-unclear reason.  Neurology recommended TEE and loop recorder.  Sinus rhythm on monitor.  Resume carvedilol at low-dose to prevent beta blocker withdrawal and recurrence of tachyarrhythmia.  Hypertension  Blood pressure is running high              Permissive hypertension (OK if < 220/120) but gradually normalize in 5-7 days              Long-term BP goal normotensive  Holding Cozaar and verapamil. Start carvedilol at low-dose as stated above. Also the strokes seems subacute and hence should be reasonable to resume antihypertensives.  Hyperlipidemia  Home meds:  Zocor 20 mg daily resumed in hospital  LDL 62, goal < 70  Continue statin at discharge  Acute cystitis (Escherichia coli and Proteus) - Complete 3 days course of antibiotics he did currently on ceftriaxone. Urine culture not helpful.  Left knee/leg pain - Lower extremity venous Doppler negative. Gives history of fall at home in [place] 3-4 weeks back. Does not seem neuropathic pain. Possibly arthritic pain. Check left knee x-ray for acute injuries but seems less likely. Symptomatic treatment.  Anemia Follow CBCs.    DVT prophylaxis:  Lovenox Code Status: Full Family Communication: None at bedside. Disposition: To be determined  Consultants:  Neurology  Rehabilitation M.D.-pending  Procedures:  2-D echo 03/15/17: Study Conclusions  - Left ventricle: The cavity size was normal. Wall thickness was   normal. Systolic function was normal. The estimated ejection   fraction was in the range of 55% to 60%. Wall motion was normal;   there were no regional wall motion abnormalities. Doppler   parameters are consistent with abnormal left ventricular   relaxation (grade 1 diastolic dysfunction). Doppler parameters   are consistent with high ventricular filling pressure. - Aortic valve: Valve area (VTI): 1.88 cm^2. Valve area (Vmax):   1.81 cm^2. Valve area (Vmean): 1.64 cm^2. - Mitral valve: Mildly calcified annulus. Normal thickness leaflets   . There was mild regurgitation. - Left atrium: The atrium was severely dilated. - Atrial septum: No defect or patent foramen ovale was identified. - Technically adequate study.  Antimicrobials:  Ceftriaxone   Subjective: Complaints of left knee pain. Gives history of fall in [place] 3-4 weeks ago. Asking when she can go home.  ROS:  No chest pain or visual symptoms reported.  Objective:  Vitals:   03/15/17 1805 03/15/17 2154 03/16/17 0125 03/16/17 0424  BP: (!) 210/99 (!) 203/74 (!) 167/66 (!) 165/59  Pulse: 92 76 82 73  Resp: 20 18 18 18   Temp: 98.1 F (36.7 C) 98 F (36.7 C) 98 F (36.7 C) 98.4 F (36.9 C)  TempSrc: Oral Oral Oral Oral  SpO2: 100% 99% 97% 100%  Weight:      Height:        Examination:  General exam: Pleasant elderly female lying comfortably propped up in bed. Respiratory system: Clear to auscultation. Respiratory effort normal. Cardiovascular system: S1 & S2 heard, RRR. No JVD, murmurs, rubs, gallops or clicks. No pedal edema. telemetry: Sinus rhythm.5 beat nonsustained SVT. Gastrointestinal system: Abdomen is nondistended, soft and  nontender. No organomegaly or masses felt. Normal bowel sounds heard. Central nervous system: Alert and oriented. No focal neurological deficits. Extremities: 5 x 5 power in right limbs. 4+ by 5 power in left upper extremity. 2 x 5 power in left lower extremity. Mild warmth of left knee and leg without tenderness or features suggestive of cellulitis. Skin: No rashes, lesions or ulcers Psychiatry: Judgement and insight appear normal. Mood & affect appropriate. ?? Some confusion at times.    Data Reviewed: I have personally reviewed following labs and imaging studies  CBC:  Recent Labs Lab 03/14/17 1152  WBC 8.1  NEUTROABS 5.9  HGB 11.8*  HCT 34.8*  MCV 81.7  PLT 280   Basic Metabolic Panel:  Recent Labs Lab 03/14/17 1152  NA 137  K 4.0  CL 106  CO2 25  GLUCOSE 119*  BUN 31*  CREATININE 1.18*  CALCIUM 9.5   Liver Function Tests:  Recent Labs Lab 03/14/17 1152  AST 22  ALT 21  ALKPHOS 63  BILITOT 0.5  PROT 7.1  ALBUMIN 3.3*   Coagulation Profile:  Recent Labs Lab 03/14/17 1152  INR 1.13   Cardiac Enzymes: No results for input(s): CKTOTAL, CKMB, CKMBINDEX, TROPONINI in the last 168 hours. HbA1C: No results  for input(s): HGBA1C in the last 72 hours. CBG: No results for input(s): GLUCAP in the last 168 hours.  Recent Results (from the past 240 hour(s))  Urine culture     Status: Abnormal (Preliminary result)   Collection Time: 03/14/17  1:06 PM  Result Value Ref Range Status   Specimen Description URINE, CATHETERIZED  Final   Special Requests NONE  Final   Culture (A)  Final    40,000 COLONIES/mL ESCHERICHIA COLI 20,000 COLONIES/mL PROTEUS MIRABILIS SUSCEPTIBILITIES TO FOLLOW Performed at [facility], [address], [place], [place] [address]    Report Status PENDING  Incomplete         Radiology Studies: Ct Angio Head W Or Wo Contrast  Result Date: 03/15/2017 CLINICAL DATA:  LEFT-sided weakness. History of cerebral infarction 3  weeks ago. MRI shows multiple vascular territories involved. EXAM: CT ANGIOGRAPHY HEAD AND NECK TECHNIQUE: Multidetector CT imaging of the head and neck was performed using the standard protocol during bolus administration of intravenous contrast. Multiplanar CT image reconstructions and MIPs were obtained to evaluate the vascular anatomy. Carotid stenosis measurements (when applicable) are obtained utilizing NASCET criteria, using the distal internal carotid diameter as the denominator. CONTRAST:  Isovue 370, 50 mL. COMPARISON:  MR brain 03/14/2017.  CT head 03/14/2017. FINDINGS: CT HEAD Calvarium and skull base: No fracture or destructive lesion. Mastoids and middle ears are grossly clear. Paranasal sinuses: Imaged portions are clear. Orbits: Negative. Brain: The multifocal RIGHT frontal, LEFT occipital, and RIGHT thalamus subacute infarcts are better seen on MR. There is global atrophy with chronic microvascular ischemic change. No hemorrhage is present. Chronic LEFT cerebellar infarct redemonstrated. CTA NECK Aortic arch: Standard branching. Imaged portion shows no evidence of aneurysm or dissection. No significant stenosis of the major arch vessel origins although widespread calcifications present along with some soft plaque involving not only the vessel origins both transverse arch. Right carotid system: Advanced dolichoectasia. Predominantly calcific plaque along the distal common carotid artery, and at the bifurcation but no measurable stenosis. No evidence of dissection, stenosis (50% or greater) or occlusion. Left carotid system: Advanced dolichoectasia. Predominantly calcific plaque along the distal common carotid artery and at the bifurcation, but no measurable stenosis. No evidence of dissection, stenosis (50% or greater) or occlusion. Vertebral arteries: RIGHT vertebral is dominant/patent from its origin to the basilar confluence. LEFT vertebral is occluded just above its origin, and is not patent in  the neck. Nonvascular soft tissues: Cervical spondylosis. No lung lesions. Normal thyroid. CTA HEAD Anterior circulation: Calcific plaque in both cavernous carotid arteries. Possible 50% stenosis on the LEFT. No proximal anterior or middle cerebral artery stenosis is evident. No MCA branch occlusion. Both distal anterior cerebral arteries patent. No aneurysm, or vascular malformation. Posterior circulation: The basilar artery is diffusely small, which likely relates to hypoplasia from BILATERAL fetal PCA origins. The basilar is also mildly irregular, and superimposed atherosclerotic disease is suspected. The RIGHT PCA is widely patent. The LEFT PCA is severely diseased proximally, and does not significantly fill in its P2 or P3 segments. No visible cerebellar branch occlusion. Probable retrograde flow in the LEFT vertebral to supply the PICA. No aneurysm, or vascular malformation. Venous sinuses: As permitted by contrast timing, patent. Anatomic variants: BILATERAL fetal PCA. Delayed phase: Gyriform enhancement of the medial/superior subacute RIGHT frontal infarct reflects its subacute nature. Similar more subtle findings in the LEFT occipital cortex. Review of the MIP images confirms the above findings IMPRESSION: No significant carotid stenosis or proximal anterior  circulation disease. 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 LEFT PCA occlusion. Subacute RIGHT frontal infarct shows mild postcontrast enhancement consistent with an insult a few weeks ago. Electronically Signed   By: Elsie Stain M.D.   On: 03/15/2017 13:59   Ct Head Wo Contrast  Result Date: 03/14/2017 CLINICAL DATA:  CVA 3 weeks ago.  Persistent weakness and confusion. EXAM: CT HEAD WITHOUT CONTRAST TECHNIQUE: Contiguous axial images were obtained from the  base of the skull through the vertex without intravenous contrast. COMPARISON:  None. FINDINGS: Brain: Age related cerebral atrophy, ventriculomegaly and periventricular white matter disease. Probable small lacunar type external capsule lacunar type infarcts on the left. No extra-axial fluid collections are identified. No CT findings for acute hemispheric infarction or intracranial hemorrhage. No mass lesions. The brainstem and cerebellum are normal. Vascular: No hyperdense vessels or definite aneurysm. Moderate vascular calcifications. Skull: No skull fracture or bone lesion. Sinuses/Orbits: The paranasal sinuses and mastoid air cells are clear. The globes are intact. Other: No scalp lesions. IMPRESSION: Cerebral atrophy, ventriculomegaly and periventricular white matter disease. Suspect lacunar type left basal ganglia infarcts. No hemispheric infarction or intracranial hemorrhage. Moderate vascular calcifications. Electronically Signed   By: Rudie Meyer M.D.   On: 03/14/2017 13:35   Ct Angio Neck W Or Wo Contrast  Result Date: 03/15/2017 CLINICAL DATA:  LEFT-sided weakness. History of cerebral infarction 3 weeks ago. MRI shows multiple vascular territories involved. EXAM: CT ANGIOGRAPHY HEAD AND NECK TECHNIQUE: Multidetector CT imaging of the head and neck was performed using the standard protocol during bolus administration of intravenous contrast. Multiplanar CT image reconstructions and MIPs were obtained to evaluate the vascular anatomy. Carotid stenosis measurements (when applicable) are obtained utilizing NASCET criteria, using the distal internal carotid diameter as the denominator. CONTRAST:  Isovue 370, 50 mL. COMPARISON:  MR brain 03/14/2017.  CT head 03/14/2017. FINDINGS: CT HEAD Calvarium and skull base: No fracture or destructive lesion. Mastoids and middle ears are grossly clear. Paranasal sinuses: Imaged portions are clear. Orbits: Negative. Brain: The multifocal RIGHT frontal, LEFT occipital,  and RIGHT thalamus subacute infarcts are better seen on MR. There is global atrophy with chronic microvascular ischemic change. No hemorrhage is present. Chronic LEFT cerebellar infarct redemonstrated. CTA NECK Aortic arch: Standard branching. Imaged portion shows no evidence of aneurysm or dissection. No significant stenosis of the major arch vessel origins although widespread calcifications present along with some soft plaque involving not only the vessel origins both transverse arch. Right carotid system: Advanced dolichoectasia. Predominantly calcific plaque along the distal common carotid artery, and at the bifurcation but no measurable stenosis. No evidence of dissection, stenosis (50% or greater) or occlusion. Left carotid system: Advanced dolichoectasia. Predominantly calcific plaque along the distal common carotid artery and at the bifurcation, but no measurable stenosis. No evidence of dissection, stenosis (50% or greater) or occlusion. Vertebral arteries: RIGHT vertebral is dominant/patent from its origin to the basilar confluence. LEFT vertebral is occluded just above its origin, and is not patent in the neck. Nonvascular soft tissues: Cervical spondylosis. No lung lesions. Normal thyroid. CTA HEAD Anterior circulation: Calcific plaque in both cavernous carotid arteries. Possible 50% stenosis on the LEFT. No proximal anterior or middle cerebral artery stenosis is evident. No MCA branch occlusion. Both distal anterior cerebral arteries patent. No aneurysm, or vascular malformation. Posterior circulation: The basilar artery is diffusely small, which  likely relates to hypoplasia from BILATERAL fetal PCA origins. The basilar is also mildly irregular, and superimposed atherosclerotic disease is suspected. The RIGHT PCA is widely patent. The LEFT PCA is severely diseased proximally, and does not significantly fill in its P2 or P3 segments. No visible cerebellar branch occlusion. Probable retrograde flow in the  LEFT vertebral to supply the PICA. No aneurysm, or vascular malformation. Venous sinuses: As permitted by contrast timing, patent. Anatomic variants: BILATERAL fetal PCA. Delayed phase: Gyriform enhancement of the medial/superior subacute RIGHT frontal infarct reflects its subacute nature. Similar more subtle findings in the LEFT occipital cortex. Review of the MIP images confirms the above findings IMPRESSION: No significant carotid stenosis or proximal anterior circulation disease. 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 LEFT PCA occlusion. Subacute RIGHT frontal infarct shows mild postcontrast enhancement consistent with an insult a few weeks ago. Electronically Signed   By: Elsie StainJohn T Curnes M.D.   On: 03/15/2017 13:59   Mr Brain Wo Contrast  Result Date: 03/14/2017 CLINICAL DATA:  Stroke sustained 3 weeks ago in [place]. Unable to walk. Confusion. EXAM: MRI HEAD WITHOUT CONTRAST TECHNIQUE: Multiplanar, multiecho pulse sequences of the brain and surrounding structures were obtained without intravenous contrast. COMPARISON:  CT same day FINDINGS: Brain: Diffusion imaging shows multiple vascular distributions subacute infarctions consistent with embolic disease from the heart or ascending aorta. There is acute infarction in the right thalamus, the left parieto-occipital junction cortical and subcortical brain and the right para median frontal brain. No evidence hemorrhage or mass effect. The brainstem is unremarkable. There are a few old small vessel cerebellar infarctions. Cerebral hemispheres otherwise show atrophy with chronic small-vessel ischemic changes of the deep and subcortical white matter and old lacunar infarctions in the basal ganglia. No evidence of neoplastic mass lesion. Ventricles are prominent, probably  secondary to atrophy. Normal pressure hydrocephalus not excluded but not specifically suggested. Vascular: Major vessels at the base of the brain show flow. 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 There is involvement of the right thalamus, left parieto-occipital junction region and right frontal para median brain. Chronic small-vessel ischemic changes elsewhere as described. Atrophy. Ventricular prominence probably secondary to ex vacuo enlargement. Normal pressure hydrocephalus not excluded, but not specifically favored. Electronically Signed   By: Paulina FusiMark  Shogry M.D.   On: 03/14/2017 15:12        Scheduled Meds: . aspirin  325 mg Oral Daily  . cefTRIAXone (ROCEPHIN)  IV  1 g Intravenous Q24H  . clopidogrel  75 mg Oral Daily  . enoxaparin (LOVENOX) injection  40 mg Subcutaneous Q24H  . latanoprost  1 drop Both Eyes [id number]  . simvastatin  20 mg Oral Daily  . temazepam  15 mg Oral QHS   Continuous Infusions:   LOS: 1 day     Ashelyn Mccravy, MD, FACP, FHM. [facility] Pager [phone]  If 7PM-7AM, please contact night-coverage [email or web address] Password [email or web address] 03/16/2017, 11:31 AM

## 2017-03-16 NOTE — Progress Notes (Signed)
STROKE TEAM PROGRESS NOTE   SUBJECTIVE (INTERVAL HISTORY) No family is at the bedside. No acute event overnight. Pt complains of back pain and she was taking pain pills in [place]     OBJECTIVE Temp:  [98 F (36.7 C)-98.4 F (36.9 C)] 98.4 F (36.9 C) (03/25 0424) Pulse Rate:  [73-93] 73 (03/25 0424) Cardiac Rhythm: Normal sinus rhythm (03/25 0700) Resp:  [18-20] 18 (03/25 0424) BP: (165-210)/(59-99) 165/59 (03/25 0424) SpO2:  [97 %-100 %] 100 % (03/25 0424)  CBC:   Recent Labs Lab 03/14/17 1152  WBC 8.1  NEUTROABS 5.9  HGB 11.8*  HCT 34.8*  MCV 81.7  PLT 280    Basic Metabolic Panel:   Recent Labs Lab 03/14/17 1152  NA 137  K 4.0  CL 106  CO2 25  GLUCOSE 119*  BUN 31*  CREATININE 1.18*  CALCIUM 9.5    Lipid Panel:     Component Value Date/Time   CHOL 131 03/15/2017 0725   TRIG 175 (H) 03/15/2017 0725   HDL 34 (L) 03/15/2017 0725   CHOLHDL 3.9 03/15/2017 0725   VLDL 35 03/15/2017 0725   LDLCALC 62 03/15/2017 0725   HgbA1c: No results found for: HGBA1C Urine Drug Screen: No results found for: LABOPIA, COCAINSCRNUR, LABBENZ, AMPHETMU, THCU, LABBARB    IMAGING I have personally reviewed the radiological images below and agree with the radiology interpretations.  Ct Head Wo Contrast 03/14/2017 Cerebral atrophy, ventriculomegaly and periventricular white matter disease. Suspect lacunar type left basal ganglia infarcts. No hemispheric infarction or intracranial hemorrhage. Moderate vascular calcifications.   Mr Brain Wo Contrast 03/14/2017 Multiple subacute infarctions in multiple vascular territories, suggesting embolic disease from the heart or ascending aorta. There is involvement of the right thalamus, left parieto-occipital junction region and right frontal para median brain. Chronic small-vessel ischemic changes elsewhere as described. Atrophy. Ventricular prominence probably secondary to ex vacuo enlargement. Normal pressure hydrocephalus not  excluded, but not specifically favored.   CTA head ane neck No significant carotid stenosis or proximal anterior circulation disease. 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 LEFT PCA occlusion. Subacute RIGHT frontal infarct shows mild postcontrast enhancement consistent with an insult a few weeks ago.  TTE - Left ventricle: The cavity size was normal. Wall thickness was   normal. Systolic function was normal. The estimated ejection   fraction was in the range of 55% to 60%. Wall motion was normal;   there were no regional wall motion abnormalities. Doppler   parameters are consistent with abnormal left ventricular   relaxation (grade 1 diastolic dysfunction). Doppler parameters   are consistent with high ventricular filling pressure. - Aortic valve: Valve area (VTI): 1.88 cm^2. Valve area (Vmax):   1.81 cm^2. Valve area (Vmean): 1.64 cm^2. - Mitral valve: Mildly calcified annulus. Normal thickness leaflets   . There was mild regurgitation. - Left atrium: The atrium was severely dilated. - Atrial septum: No defect or patent foramen ovale was identified. - Technically adequate study.  LE venous doppler - There is no DVT noted in the left lower extremity.  There is superficial thrombosis noted in the left lesser saphenous vein and in varicosities in the left calf.    PHYSICAL EXAM Temp:  [98 F (36.7 C)-98.4 F (36.9 C)] 98.4 F (36.9 C) (03/25 0424) Pulse Rate:  [73-93] 73 (03/25 0424) Resp:  [18-20] 18 (03/25  0424) BP: (165-210)/(59-99) 165/59 (03/25 0424) SpO2:  [97 %-100 %] 100 % (03/25 0424)  General - Well nourished, well developed, in no apparent distress.  Ophthalmologic - Fundi not visualized due to eye movement.  Cardiovascular - Regular rate and rhythm.  Mental Status -   Level of arousal and orientation to month and person were intact, however, not orientated to place and year. Language including expression, naming, repetition, comprehension was assessed and found intact, mild dysarthria. Recent and remote memory were intact impaired Fund of Knowledge was assessed and was impaired, can not tell me the current and past presidents.  Cranial Nerves II - XII - II - Visual field intact OU. III, IV, VI - Extraocular movements intact. V - Facial sensation intact bilaterally. VII - Facial movement intact bilaterally. VIII - Hearing & vestibular intact bilaterally. X - Palate elevates symmetrically. XI - Chin turning & shoulder shrug intact bilaterally. XII - Tongue protrusion intact.  Motor Strength - [name] strength was normal in RUE and RLE, however, left UE 4-/5 and LLE 2/5 proximal with DF 3/5 distally and pronator drift was present.  Bulk was normal and fasciculations were absent.   Motor Tone - Muscle tone was assessed at the neck and appendages and was normal.  Reflexes - [name] reflexes were 1+ in all extremities and she had no pathological reflexes.  Sensory - Light touch, temperature/pinprick were assessed and were symmetrical.    Coordination - The patient had normal movements in the hands with no ataxia or dysmetria.  Tremor was absent.  Gait and Station - deferred   ASSESSMENT/PLAN [name] is a 71 y.o. female with history of hypertension and a stroke in [place] 3 weeks ago with subsequent left hemiparesis, presenting with diplopia and headache. She did not receive IV t-PA due to recent stroke history.   Strokes:  Left MCA, right thalamus, right ACA infarct, embolic pattern - source unclear.  Resultant  Disorientation, left hemiparesis  MRI - Multiple subacute infarctions including Left MCA, right thalamus, right ACA  suggesting embolic disease  CTA H&N - left PCA occlusion, b/l fetal PCA, BA hypoplastic  2D  Echo - EF 55-60%  LE venous Dopplers - no DVT  Recommend TEE and loop recorder for cardioembolic work up - pt does have hx of arrhythmia long time ago in [place]  LDL - 62  HgbA1c - pending  VTE prophylaxis - Lovenox Diet Heart Room service appropriate? Yes; Fluid consistency: Thin Diet NPO time specified Except for: Sips with Meds  aspirin 325 mg daily and clopidogrel 75 mg daily prior to admission, now on aspirin 325 mg daily and clopidogrel 75 mg daily.   Patient counseled to be compliant with her antithrombotic medications  Ongoing aggressive stroke risk factor management  Therapy recommendations: CIR  Disposition:  Pending  Hx of arrhythmia  Pt stated that she had arrhythmia long time ago, but not recently  Not sure about what type of arrhythmia  On verapmil but not sure what the reason she is on it  On tele, pt had 5 beat of SVT, put back on beta blocker  Recommend TEE and loop recorder for cardioembolic work up  Hypertension  Blood pressure is running high  Coreg resumed.  Long-term BP goal normotensive  Hyperlipidemia  Home meds:  Zocor 20 mg daily resumed in hospital  LDL 62, goal < 70  Continue statin at discharge  Other Stroke Risk Factors  Advanced age  Other Active Problems  Mild anemia - 11.8 / 34.8  UTI - on rocephin started 03/15/17  Hospital day # 1  Marvel PlanJindong Torben Soloway, MD PhD Stroke Neurology 03/16/2017 2:39 PM    To contact Stroke Continuity provider, please refer to [email or web address]. After hours, contact General Neurology

## 2017-03-17 DIAGNOSIS — I749 Embolism and thrombosis of unspecified artery: Secondary | ICD-10-CM

## 2017-03-17 DIAGNOSIS — I471 Supraventricular tachycardia: Secondary | ICD-10-CM

## 2017-03-17 DIAGNOSIS — G8929 Other chronic pain: Secondary | ICD-10-CM

## 2017-03-17 LAB — URINE CULTURE: Culture: 40000 — AB

## 2017-03-17 LAB — CBC
HCT: 32.2 % — ABNORMAL LOW (ref 36.0–46.0)
HEMOGLOBIN: 10.5 g/dL — AB (ref 12.0–15.0)
MCH: 27.3 pg (ref 26.0–34.0)
MCHC: 32.6 g/dL (ref 30.0–36.0)
MCV: 83.6 fL (ref 78.0–100.0)
Platelets: 232 10*3/uL (ref 150–400)
RBC: 3.85 MIL/uL — AB (ref 3.87–5.11)
RDW: 13.1 % (ref 11.5–15.5)
WBC: 6.3 10*3/uL (ref 4.0–10.5)

## 2017-03-17 MED ORDER — VERAPAMIL HCL ER 240 MG PO TBCR
240.0000 mg | EXTENDED_RELEASE_TABLET | Freq: Every day | ORAL | Status: DC
  Administered 2017-03-17 – 2017-03-18 (×2): 240 mg via ORAL
  Filled 2017-03-17 (×3): qty 1

## 2017-03-17 MED ORDER — ZOLPIDEM TARTRATE 5 MG PO TABS
5.0000 mg | ORAL_TABLET | Freq: Every evening | ORAL | Status: DC | PRN
  Administered 2017-03-18: 5 mg via ORAL
  Filled 2017-03-17 (×2): qty 1

## 2017-03-17 MED ORDER — SODIUM CHLORIDE 0.9 % IV SOLN: INTRAVENOUS | Status: DC

## 2017-03-17 MED ORDER — CARVEDILOL 12.5 MG PO TABS
12.5000 mg | ORAL_TABLET | Freq: Two times a day (BID) | ORAL | Status: DC
  Administered 2017-03-17 – 2017-03-19 (×5): 12.5 mg via ORAL
  Filled 2017-03-17 (×6): qty 1

## 2017-03-17 NOTE — Progress Notes (Signed)
PROGRESS NOTE   Christina [name]  ZOX:[id number]    DOB: 1946-11-26    DOA: 03/14/2017  PCP: Pcp Not In System   I have briefly reviewed patients [facility].  Brief Narrative:  71 year old female, lives in [place], recent hospitalization in [place] on 02/27/17 for 5 days with CVA and left hemiplegia, HTN, former tobacco abuse, presented to ED with complaints of headache, double vision and left-sided body pains. History provided by patient and daughter at bedside. Daughter indicates that during the latter part of hospitalization, she traveled from [place] to assist her mother. Mother was discharged from the hospital without any home health services and she was doing the best she can with physical therapy. Patient developed bedsores but healed with TLC. However she continued to be confused and complained of headache and left-sided body pain. They then traveled to the [place] and came to the ED the same day when there [place] in [place]. [place] head confirmed suspected lacunar type left basilar ganglia infarcts without hemorrhage. Neurology was consulted. Stroke workup ongoing. Possible TEE 03/17/17. CIR consulted.   Assessment & Plan:   Active Problems:   TIA (transient ischemic attack)   History of CVA (cerebrovascular accident)   HTN (hypertension)   HLD (hyperlipidemia)   Tobacco abuse, in remission   Acute cystitis without hematuria   Stroke [facility](HCC)   Strokes:  Left MCA, right thalamus, right ACA infarct, embolic pattern - source unclear.  Resultant  Disorientation, left hemiparesis. Disorientation resolved. Left hemiparesis improving.  MRI - Multiple subacute infarctions including Left MCA, right thalamus, right ACA  suggesting embolic disease  CTA H&N - left PCA occlusion, b/l fetal PCA, BA hypoplastic, occluded left vertebral artery  2D Echo - EF 55-60%  Left lower extremity venous Doppler: No DVT noted. There is a superficial  thrombosis noted in the left lesser saphenous vein and several thrombosed varicosities in the left calf.  Neurology recommended TEE and loop recorder for cardioembolic work up - pt does have hx of arrhythmia long time ago in [place]. Cardiology plans TEE 3/26. Telemetry: Sinus rhythm. Intermittent but transient nonsustained narrow complex tachycardia-likely SVT. Requested cardiology consultation to see patient prior to TEE/ILR in case her rhythm is atrial flutter with 2 in one block which would then require anticoagulation and may not need TEE/ILR.  LDL - 62  HgbA1c - 5.7  aspirin 325 mg daily and clopidogrel 75 mg daily prior to admission, now on aspirin 325 mg daily and clopidogrel 75 mg daily.   Patient counseled to be compliant with her antithrombotic medications  Ongoing aggressive stroke risk factor management  Therapy recommendations: CIR-consulted >input pending.  Disposition:  Pending  SVT  Pt stated that she had arrhythmia long time ago, but not recently  Not sure about what type of arrhythmia. Patient was on carvedilol, verapamil PTA-unclear reason.  Neurology recommended TEE and loop recorder. Please see discussion above  Sinus rhythm on monitor. Periods of intermittent but transient nonsustained narrow complex tachycardia, likely SVT >requested cardiology to see prior to procedures.  Discussed with Dr. Roda ShuttersXu on 3/25> okay to resume medications and better control blood pressure. Resumed carvedilol and verapamil.  Hypertension  Blood pressure is running high              Permissive hypertension (OK if < 220/120) but gradually normalize in 5-7 days              Long-term BP goal normotensive  Holding Cozaar. Increase carvedilol to 12.5 MG twice a day (was on 25 MG daily) and resume home dose of verapamil 240 mg at bedtime.  Hyperlipidemia  Home meds:  Zocor 20 mg daily resumed in hospital  LDL 62, goal < 70  Continue statin at discharge  Acute cystitis,  uncomplicated (Escherichia coli and Proteus) - Completed 3 days ceftriaxone. Urine culture results appreciated. DC ceftriaxone.  Left knee/leg pain - Lower extremity venous Doppler negative. Gives history of fall at home in [place] 3-4 weeks back. Does not seem neuropathic pain. Possibly arthritic pain. X-ray of left knee without acute findings. Symptomatic treatment. Improved.  Anemia Follow CBCs periodically.    DVT prophylaxis: Lovenox Code Status: Full Family Communication: Discussed in detail with patient's son at bedside. Disposition: To be determined  Consultants:  Neurology  Rehabilitation M.D.-pending  Procedures:  2-D echo 03/15/17: Study Conclusions  - Left ventricle: The cavity size was normal. Wall thickness was   normal. Systolic function was normal. The estimated ejection   fraction was in the range of 55% to 60%. Wall motion was normal;   there were no regional wall motion abnormalities. Doppler   parameters are consistent with abnormal left ventricular   relaxation (grade 1 diastolic dysfunction). Doppler parameters   are consistent with high ventricular filling pressure. - Aortic valve: Valve area (VTI): 1.88 cm^2. Valve area (Vmax):   1.81 cm^2. Valve area (Vmean): 1.64 cm^2. - Mitral valve: Mildly calcified annulus. Normal thickness leaflets   . There was mild regurgitation. - Left atrium: The atrium was severely dilated. - Atrial septum: No defect or patent foramen ovale was identified. - Technically adequate study.  Antimicrobials:  Ceftriaxone   Subjective: Denied complaints. Denied pain. As per son at bedside: He stayed with his mother overnight and she rested well. No left knee pain. He states that patient had issues with memory and some confusion even prior to her recent strokes and feels that her mental status is almost close to baseline.  ROS:  No chest pain or visual symptoms reported.  Objective:  Vitals:   03/16/17 2117 03/17/17  0123 03/17/17 0506 03/17/17 1007  BP: (!) 205/77 (!) 188/82 (!) 204/76 (!) 208/81  Pulse: 66 71 76 78  Resp: 18 18 18 18   Temp: 98.2 F (36.8 C) 97.6 F (36.4 C) 98.4 F (36.9 C) 98.1 F (36.7 C)  TempSrc: Oral Oral Oral Oral  SpO2: 100% 100% 100% 100%  Weight:      Height:        Examination:  General exam: Pleasant elderly female lying comfortably propped up in bed. Respiratory system: Clear to auscultation. Respiratory effort normal. Cardiovascular system: S1 & S2 heard, RRR. No JVD, murmurs, rubs, gallops or clicks. No pedal edema. telemetry: Sinus rhythm. Couple of episodes of nonsustained narrow complex tachycardia, likely SVT (on 3/25 at 12:16 PM and 11:48 PM.) Gastrointestinal system: Abdomen is nondistended, soft and nontender. No organomegaly or masses felt. Normal bowel sounds heard. Central nervous system: Alert and oriented. No focal neurological deficits. Extremities: 5 x 5 power in right limbs. 4+ by 5 power in left upper extremity. 2 x 5 power in left lower extremity. No acute MSS findings on exam of left knee and left leg. Skin: No rashes, lesions or ulcers Psychiatry: Judgement and insight impaired. Mood & affect appropriate. ?? Some confusion at times.    Data Reviewed: I have personally reviewed following labs and imaging studies  CBC:  Recent Labs Lab 03/14/17 1152 03/17/17  0504  WBC 8.1 6.3  NEUTROABS 5.9  --   HGB 11.8* 10.5*  HCT 34.8* 32.2*  MCV 81.7 83.6  PLT 280 232   Basic Metabolic Panel:  Recent Labs Lab 03/14/17 1152  NA 137  K 4.0  CL 106  CO2 25  GLUCOSE 119*  BUN 31*  CREATININE 1.18*  CALCIUM 9.5   Liver Function Tests:  Recent Labs Lab 03/14/17 1152  AST 22  ALT 21  ALKPHOS 63  BILITOT 0.5  PROT 7.1  ALBUMIN 3.3*   Coagulation Profile:  Recent Labs Lab 03/14/17 1152  INR 1.13   Cardiac Enzymes: No results for input(s): CKTOTAL, CKMB, CKMBINDEX, TROPONINI in the last 168 hours. HbA1C:  Recent Labs   03/15/17 0725  HGBA1C 5.7*   CBG: No results for input(s): GLUCAP in the last 168 hours.  Recent Results (from the past 240 hour(s))  Urine culture     Status: Abnormal   Collection Time: 03/14/17  1:06 PM  Result Value Ref Range Status   Specimen Description URINE, CATHETERIZED  Final   Special Requests NONE  Final   Culture (A)  Final    40,000 COLONIES/mL ESCHERICHIA COLI 20,000 COLONIES/mL PROTEUS MIRABILIS    Report Status 03/17/2017 FINAL  Final   Organism ID, Bacteria ESCHERICHIA COLI (A)  Final   Organism ID, Bacteria PROTEUS MIRABILIS (A)  Final      Susceptibility   Escherichia coli - MIC*    AMPICILLIN <=2 SENSITIVE Sensitive     CEFAZOLIN <=4 SENSITIVE Sensitive     CEFTRIAXONE <=1 SENSITIVE Sensitive     CIPROFLOXACIN <=0.25 SENSITIVE Sensitive     GENTAMICIN <=1 SENSITIVE Sensitive     IMIPENEM <=0.25 SENSITIVE Sensitive     NITROFURANTOIN <=16 SENSITIVE Sensitive     TRIMETH/SULFA >=320 RESISTANT Resistant     AMPICILLIN/SULBACTAM <=2 SENSITIVE Sensitive     PIP/TAZO <=4 SENSITIVE Sensitive     Extended ESBL NEGATIVE Sensitive     * 40,000 COLONIES/mL ESCHERICHIA COLI   Proteus mirabilis - MIC*    AMPICILLIN <=2 SENSITIVE Sensitive     CEFAZOLIN <=4 SENSITIVE Sensitive     CEFTRIAXONE <=1 SENSITIVE Sensitive     CIPROFLOXACIN <=0.25 SENSITIVE Sensitive     GENTAMICIN <=1 SENSITIVE Sensitive     IMIPENEM 2 SENSITIVE Sensitive     NITROFURANTOIN 128 RESISTANT Resistant     TRIMETH/SULFA <=20 SENSITIVE Sensitive     AMPICILLIN/SULBACTAM <=2 SENSITIVE Sensitive     PIP/TAZO <=4 SENSITIVE Sensitive     * 20,000 COLONIES/mL PROTEUS MIRABILIS         Radiology Studies: Dg Knee Complete 4 Views Left  Result Date: 03/16/2017 CLINICAL DATA:  Patient with persistent left knee pain for 6 months. EXAM: LEFT KNEE - COMPLETE 4+ VIEW COMPARISON:  None. FINDINGS: Normal anatomic alignment. No evidence for acute fracture dislocation. Medial compartment joint space  narrowing and osteophytosis. Vascular calcifications. IMPRESSION: No acute osseous abnormality. Degenerative changes, most pronounced within the medial compartment. Electronically Signed   By: Annia Belt M.D.   On: 03/16/2017 13:17        Scheduled Meds: . aspirin  325 mg Oral Daily  . carvedilol  12.5 mg Oral BID WC  . cefTRIAXone (ROCEPHIN)  IV  1 g Intravenous Q24H  . clopidogrel  75 mg Oral Daily  . enoxaparin (LOVENOX) injection  40 mg Subcutaneous Q24H  . latanoprost  1 drop Both Eyes [id number]  . simvastatin  20 mg Oral Daily  .  temazepam  15 mg Oral QHS  . verapamil  240 mg Oral QHS   Continuous Infusions: . sodium chloride       LOS: 2 days     Alberta Lenhard, MD, FACP, FHM. [facility] Pager [phone]  If 7PM-7AM, please contact night-coverage [email or web address] Password [email or web address] 03/17/2017, 12:12 PM

## 2017-03-17 NOTE — Progress Notes (Signed)
qPhysical Therapy Treatment Patient Details Name: Christina [name] MRN: [id number] DOB: [birth year] Today's Date: 03/17/2017    History of Present Illness Pt is a 71 y/o female who presents with L-sided weakness, diplopia, and HA. 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 This was [place] Daughter brought the pt to the [place] for continued care after she was not improving. MRI of brain revealed no acute changes.     PT Comments    Pt progressing slowly towards physical therapy goals. Family present during session today, and feel pt was distracted. Discussed with therapist from previous day and feel she may do better and get more out of her therapy sessions if family was not in the room. Will continue to follow and progress as able per POC.    Follow Up Recommendations  CIR;Supervision/Assistance - 24 hour     Equipment Recommendations  Rolling walker with 5" wheels;3in1 (PT)    Recommendations for Other Services Rehab consult     Precautions / Restrictions Precautions Precautions: Fall Precaution Comments: Inconsistent impairments throughout session. Restrictions Weight Bearing Restrictions: No    Mobility  Bed Mobility               General bed mobility comments: pt in chair at start of PT  Transfers Overall transfer level: Needs assistance Equipment used: Rolling walker (2 wheeled) Transfers: Sit to/from Stand Sit to Stand: Mod assist;+2 physical assistance         General transfer comment: Pt required physical assistance to scoot to edge of chair and required verbal and tactile cues for hand placement before power up.  Heavy mod assistance needed for power up and to maintain WB once standing.  pt completed 2 standing trials: first for 1 minute then second for 3-4 minutes.  Pt demonstrated several seemingly intentional movements throughout standing trials, including L head tilts and head/neck rolling.  Pt was able to correct L  lateral lean to more upright posture with verbal and tactile cues, but was unable to maintain adjustments.   Ambulation/Gait             General Gait Details: Standing trials did not progress to ambulation because pt BP reading reached 202/115 after standing activity.   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  able to correct temporarily with verbal and tactile cues along with visual cues from mirror.  pt required +2 assist for standing to maintain weight bearing and upright posture.  pt instructed to straighten L knee but continued to stand with it slightly bent, suggesting decreased WB on L LE.                            Cognition Arousal/Alertness: Awake/alert Behavior During Therapy: Flat affect;WFL for tasks assessed/performed Overall Cognitive Status: Impaired/Different from baseline Area of Impairment: Following commands;Safety/judgement;Attention;Problem solving  Current Attention Level: Selective;Alternating   Following Commands: Follows one step commands with increased time;Follows multi-step commands inconsistently Safety/Judgement: Decreased awareness of safety;Decreased awareness of deficits   Problem Solving: Slow processing;Decreased initiation;Difficulty  sequencing;Requires verbal cues;Requires tactile cues General Comments: Pt showed delayed reactions to questions and commands. Difficult to tell whether delayed responses were from the language barrier or confusion.  Pt looked to daughter for most answers/guidance often throughout session.      Exercises      General Comments        Pertinent Vitals/Pain Pain Assessment: 0-10 Pain Score: 10-Worst pain ever Faces Pain Scale: Hurts little more Pain Location: pt insisted her L knee pain was 10/10 even when told that 10/10 is the worst pain possible. This rating seems inconsistent with faces pain scale/pt's behavior during PT. Pain Intervention(s): Limited activity within patient's tolerance;Monitored during session;Repositioned    Home Living                      Prior Function            PT Goals (current goals can now be found in the care plan section) Acute Rehab PT Goals PT Goal Formulation: With patient/family Time For Goal Achievement: 03/29/17 Potential to Achieve Goals: Good Progress towards PT goals: Progressing toward goals    Frequency    Min 4X/week      PT Plan Current plan remains appropriate    Co-evaluation             End of Session Equipment Utilized During Treatment: Gait belt Activity Tolerance: Patient limited by fatigue;Treatment limited secondary to medical complications (Comment) (Increased BP) Patient left: in chair;with call bell/phone within reach;with family/visitor present Nurse Communication: Mobility status (BP) PT Visit Diagnosis: Pain;Hemiplegia and hemiparesis Hemiplegia - Right/Left: Left Hemiplegia - dominant/non-dominant: Non-dominant Hemiplegia - caused by: Cerebral infarction Pain - Right/Left: Left Pain - part of body: Knee     Time: [id number] PT Time Calculation (min) (ACUTE ONLY): 23 min  Charges:  $Therapeutic Activity: 23-37 mins                    G Codes:       Conni SlipperLaura Ellanor Feuerstein, PT, DPT Acute  Rehabilitation Services Pager: [phone]    Marylynn PearsonLaura D Mikala Podoll 03/17/2017, 1:28 PM

## 2017-03-17 NOTE — Progress Notes (Signed)
STROKE TEAM PROGRESS NOTE   SUBJECTIVE (INTERVAL HISTORY) Son and husband and daughter are at the bedside. No acute event overnight. PT/OT recommend CIR and they are working with insurance now. Telemetry yesterday found to have paroxysmal SVT, cardiology consulted. TEE cancelled today and will follow the recommendations from cardiology.     OBJECTIVE Temp:  [97.6 F (36.4 C)-99 F (37.2 C)] 98.1 F (36.7 C) (03/26 1007) Pulse Rate:  [66-78] 78 (03/26 1007) Cardiac Rhythm: Normal sinus rhythm (03/26 0800) Resp:  [18-20] 18 (03/26 1007) BP: (173-213)/(66-91) 173/83 (03/26 1100) SpO2:  [100 %] 100 % (03/26 1007)  CBC:   Recent Labs Lab 03/14/17 1152 03/17/17 0504  WBC 8.1 6.3  NEUTROABS 5.9  --   HGB 11.8* 10.5*  HCT 34.8* 32.2*  MCV 81.7 83.6  PLT 280 232    Basic Metabolic Panel:   Recent Labs Lab 03/14/17 1152  NA 137  K 4.0  CL 106  CO2 25  GLUCOSE 119*  BUN 31*  CREATININE 1.18*  CALCIUM 9.5    Lipid Panel:     Component Value Date/Time   CHOL 131 03/15/2017 0725   TRIG 175 (H) 03/15/2017 0725   HDL 34 (L) 03/15/2017 0725   CHOLHDL 3.9 03/15/2017 0725   VLDL 35 03/15/2017 0725   LDLCALC 62 03/15/2017 0725   HgbA1c:  Lab Results  Component Value Date   HGBA1C 5.7 (H) 03/15/2017   Urine Drug Screen: No results found for: LABOPIA, COCAINSCRNUR, LABBENZ, AMPHETMU, THCU, LABBARB    IMAGING I have personally reviewed the radiological images below and agree with the radiology interpretations.  Ct Head Wo Contrast 03/14/2017 Cerebral atrophy, ventriculomegaly and periventricular white matter disease. Suspect lacunar type left basal ganglia infarcts. No hemispheric infarction or intracranial hemorrhage. Moderate vascular calcifications.   Mr Brain Wo Contrast 03/14/2017 Multiple subacute infarctions in multiple vascular territories, suggesting embolic disease from the heart or ascending aorta. There is involvement of the right thalamus, left  parieto-occipital junction region and right frontal para median brain. Chronic small-vessel ischemic changes elsewhere as described. Atrophy. Ventricular prominence probably secondary to ex vacuo enlargement. Normal pressure hydrocephalus not excluded, but not specifically favored.   CTA head ane neck No significant carotid stenosis or proximal anterior circulation disease. 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 LEFT PCA occlusion. Subacute RIGHT frontal infarct shows mild postcontrast enhancement consistent with an insult a few weeks ago.  TTE - Left ventricle: The cavity size was normal. Wall thickness was   normal. Systolic function was normal. The estimated ejection   fraction was in the range of 55% to 60%. Wall motion was normal;   there were no regional wall motion abnormalities. Doppler   parameters are consistent with abnormal left ventricular   relaxation (grade 1 diastolic dysfunction). Doppler parameters   are consistent with high ventricular filling pressure. - Aortic valve: Valve area (VTI): 1.88 cm^2. Valve area (Vmax):   1.81 cm^2. Valve area (Vmean): 1.64 cm^2. - Mitral valve: Mildly calcified annulus. Normal thickness leaflets   . There was mild regurgitation. - Left atrium: The atrium was severely dilated. - Atrial septum: No defect or patent foramen ovale was identified. - Technically adequate study.  LE venous doppler - There is no DVT noted in the left lower extremity.  There is superficial thrombosis noted in the left lesser saphenous vein and in  varicosities in the left calf.    PHYSICAL EXAM Temp:  [97.6 F (36.4 C)-99 F (37.2 C)] 98.1 F (36.7 C) (03/26 1007) Pulse Rate:  [66-78] 78 (03/26 1007) Resp:  [18-20] 18 (03/26 1007) BP: (173-213)/(66-91) 173/83 (03/26  1100) SpO2:  [100 %] 100 % (03/26 1007)  General - Well nourished, well developed, in no apparent distress.  Ophthalmologic - Fundi not visualized due to eye movement.  Cardiovascular - Regular rate and rhythm.  Mental Status -  Level of arousal and orientation to month and person were intact, however, not orientated to place and year. Language including expression, naming, repetition, comprehension was assessed and found intact, mild dysarthria. Recent and remote memory were intact impaired Fund of Knowledge was assessed and was impaired, can not tell me the current and past presidents.  Cranial Nerves II - XII - II - Visual field intact OU. III, IV, VI - Extraocular movements intact. V - Facial sensation intact bilaterally. VII - Facial movement intact bilaterally. VIII - Hearing & vestibular intact bilaterally. X - Palate elevates symmetrically. XI - Chin turning & shoulder shrug intact bilaterally. XII - Tongue protrusion intact.  Motor Strength - [name] strength was normal in RUE and RLE, however, left UE 4/5 and LLE 2-/5 proximal with DF 3/5 distally and pronator drift was present.  Bulk was normal and fasciculations were absent.   Motor Tone - Muscle tone was assessed at the neck and appendages and was normal.  Reflexes - [name] reflexes were 1+ in all extremities and she had no pathological reflexes.  Sensory - Light touch, temperature/pinprick were assessed and were symmetrical.    Coordination - The patient had normal movements in the hands with no ataxia or dysmetria.  Tremor was absent.  Gait and Station - deferred   ASSESSMENT/PLAN [name] is a 71 y.o. female with history of hypertension and a stroke in [place] 3 weeks ago with subsequent left hemiparesis, presenting with diplopia and headache. She did not receive IV t-PA due to recent stroke history.   Strokes:  Left MCA, right thalamus, right ACA infarct, embolic pattern - source  unclear.  Resultant  Disorientation, left hemiparesis  MRI - Multiple subacute infarctions including Left MCA, right thalamus, right ACA  suggesting embolic disease  CTA H&N - left PCA occlusion, b/l fetal PCA, BA hypoplastic  2D Echo - EF 55-60%  LE venous Dopplers - no DVT  If not afib found on tele, will recommend TEE and loop recorder for cardioembolic work up - pt does have hx of arrhythmia long time ago in [place]  LDL - 62  HgbA1c - pending  VTE prophylaxis - Lovenox Diet Heart Room service appropriate? Yes; Fluid consistency: Thin  aspirin 325 mg daily and clopidogrel 75 mg daily prior to admission, now on aspirin 325 mg daily and clopidogrel 75 mg daily.   Patient counseled to be compliant with her antithrombotic medications  Ongoing aggressive stroke risk factor management  Therapy recommendations: CIR  Disposition:  Pending  Hx of arrhythmia  Pt stated that she had arrhythmia long time ago, but not recently  Not sure about what type of arrhythmia  On verapmil but not sure what the reason she is on it  On tele, pt had 5 beat of SVT, put back on beta blocker  Cardiology consulted   If no afib found on tele, will recommend TEE and loop recorder for cardioembolic work up  Hypertension  Blood pressure is running  high  Coreg resumed.  Long-term BP goal normotensive  Hyperlipidemia  Home meds:  Zocor 20 mg daily resumed in hospital  LDL 62, goal < 70  Continue statin at discharge  Other Stroke Risk Factors  Advanced age  Other Active Problems  Mild anemia - 11.8 / 34.8  UTI - on rocephin started 03/15/17  Hospital day # 2  Marvel Plan, MD PhD Stroke Neurology 03/17/2017 2:55 PM    To contact Stroke Continuity provider, please refer to [email or web address]. After hours, contact General Neurology

## 2017-03-17 NOTE — Progress Notes (Signed)
Occupational Therapy Treatment Patient Details Name: Christina [name] MRN: [id number] DOB: 1946-07-29 Today's Date: 03/17/2017    History of present illness Pt is a 71 y/o female who presents with L-sided weakness, diplopia, and HA. 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 This was in [place] Daughter brought the pt to the [place] for continued care after she was not improving. MRI of brain revealed no acute changes.    OT comments  Pt progressing toward OT goals. She was able to stand at the sink for grooming tasks with moderate assistance +2 and multimodal cues for attention. She also benefited from visual feedback from mirror for improved functional posturing during grooming tasks. Facilitated L UE weightbearing during functional reaching tasks at sink. Pt easily distracted by external stimuli this session and benefited from tactile as well as verbal cues throughout session. D/C plan remains appropriate and OT will continue to follow while admitted.    Follow Up Recommendations  CIR;Supervision/Assistance - 24 hour    Equipment Recommendations  Other (comment) (defer to next venue of care)    Recommendations for Other Services Rehab consult    Precautions / Restrictions Precautions Precautions: Fall Precaution Comments: Inconsistent impairments throughout session. Restrictions Weight Bearing Restrictions: No       Mobility Bed Mobility               General bed mobility comments: OOB in chair on arrival  Transfers Overall transfer level: Needs assistance Equipment used: 2 person hand held assist Transfers: Sit to/from Stand Sit to Stand: Mod assist;+2 physical assistance         General transfer comment: Pt required physical assistance to scoot to edge of chair and required verbal and tactile cues for hand placement before power up.  Heavy mod assistance needed for power up and to maintain WB once standing.  pt completed 2  standing trials: first for 1 minute then second for 3-4 minutes.  Pt demonstrated several seemingly intentional movements throughout standing trials, including L head tilts and head/neck rolling.  Pt was able to correct L lateral lean to more upright posture with verbal and tactile cues, but was unable to maintain adjustments.     Balance Overall balance assessment: Needs assistance Sitting-balance support: Bilateral upper extremity supported;Feet supported Sitting balance-Leahy Scale: Poor Sitting balance - Comments: L lateral lean and poor postural control. Postural control: Left lateral lean Standing balance support: Bilateral upper extremity supported Standing balance-Leahy Scale: Poor Standing balance comment: L lateral lean requiring mod assist +2 with hands on facilitation along with mirror biofeedback to correct.                            ADL either performed or assessed with clinical judgement   ADL Overall ADL's : Needs assistance/impaired     Grooming: Moderate assistance;Standing (+2 assist for posture and standing balance) Grooming Details (indicate cue type and reason): Pt able to brush her teeth with maximum verbal and tactile for posture along with hands on facilitation.              Lower Body Dressing: Moderate assistance;Sitting/lateral leans Lower Body Dressing Details (indicate cue type and reason): Donned R sock in sitting with multimodal cues and required mod assist for L LE.               General ADL Comments: Pt stood at sink for grooming tasks with mirror  biofeedback, moderate assistance, and hands on facilitation of functional posture. Facilitated L UE weight bearing during functional reaching tasks during ADL.  Pt required multimodal cues for attention to task.     Vision   Additional Comments: No reports of blurry vision or dizziness this session. Will continue to assess.   Perception     Praxis      Cognition Arousal/Alertness:  Awake/alert Behavior During Therapy: Flat affect;WFL for tasks assessed/performed Overall Cognitive Status: Impaired/Different from baseline Area of Impairment: Following commands;Safety/judgement;Attention;Problem solving                   Current Attention Level: Selective;Alternating   Following Commands: Follows one step commands with increased time;Follows one step commands inconsistently Safety/Judgement: Decreased awareness of safety;Decreased awareness of deficits   Problem Solving: Slow processing;Decreased initiation;Difficulty sequencing;Requires verbal cues;Requires tactile cues General Comments: Pt requiring increased time for one-step commands and required multimodal cueing to follow through. Pt was easily distracted by conversations occuring in the room not involving her. Pt frequently attempting to turn the water on when this was not relevant to task at hand.         Exercises     Shoulder Instructions       General Comments      Pertinent Vitals/ Pain       Pain Assessment: No/denies pain Pain Score: 10-Worst pain ever Faces Pain Scale: Hurts little more Pain Location: pt insisted her L knee pain was 10/10 even when told that 10/10 is the worst pain possible. This rating seems inconsistent with faces pain scale/pt's behavior during PT. Pain Intervention(s): Limited activity within patient's tolerance;Monitored during session;Repositioned  Home Living                                          Prior Functioning/Environment              Frequency  Min 3X/week        Progress Toward Goals  OT Goals(current goals can now be found in the care plan section)  Progress towards OT goals: Progressing toward goals  Acute Rehab OT Goals Patient Stated Goal: Get better and more independent OT Goal Formulation: With patient/family Time For Goal Achievement: 03/29/17 Potential to Achieve Goals: Good ADL Goals Pt Will Perform Upper  Body Bathing: with supervision;sitting Pt Will Perform Lower Body Bathing: with supervision;sitting/lateral leans Pt Will Transfer to Toilet: with min guard assist;stand pivot transfer;bedside commode Pt Will Perform Toileting - Clothing Manipulation and hygiene: with modified independence;sit to/from stand Additional ADL Goal #1: Pt will perform bed mobility at supervision level prior to ADL  Plan Discharge plan remains appropriate    Co-evaluation                 End of Session Equipment Utilized During Treatment: Gait belt  OT Visit Diagnosis: Unsteadiness on feet (R26.81);Other abnormalities of gait and mobility (R26.89);Hemiplegia and hemiparesis;Other symptoms and signs involving the nervous system (R29.898);Muscle weakness (generalized) (M62.81) Hemiplegia - Right/Left: Left Hemiplegia - dominant/non-dominant: Non-Dominant Hemiplegia - caused by: Cerebral infarction   Activity Tolerance Patient tolerated treatment well   Patient Left in bed;with bed alarm set;with family/visitor present   Nurse Communication Mobility status;Other (comment) (BP 173/83 at end of session)        Time: 1152-1228 OT Time Calculation (min): 36 min  Charges: OT General Charges $OT Visit: 1 Procedure OT  Treatments $Self Care/Home Management : 23-37 mins  Doristine Sectionharity A Aracely Rickett, MS OTR/L  Pager: [phone]    Zamari Bonsall A Coston Mandato 03/17/2017, 2:25 PM

## 2017-03-17 NOTE — Consult Note (Signed)
Physical Medicine and Rehabilitation Consult Reason for Consult: Left MCA, right thalamus, right ACA infarct, embolic pattern Referring Physician: [facility]   HPI: Christina [name] is a 71 y.o. right handed female with history of hypertension, prior tobacco abuse and recently diagnosed with CVA about 3 weeks ago while in [place] with left-sided weakness and placed on aspirin and Plavix for CVA prophylaxis. She was later discharged to home but by report of family unable to ambulate. She was subsequently brought to the [place] by her daughter for further management and presented to [facility] 03/14/2017. Per chart review patient is from [place] had been living in [place] for the past 30 years alone and was independent. She has family in [place] who can assist as needed. A CT of the head showed cerebral atrophy, ventriculomegaly and periventricular white matter disease. Suspect lacunar type left basal ganglia infarct. MRI of the brain showed multiple subacute infarcts in multiple vascular territories suggesting embolic disease from the heart or ascending aorta. There was involvement of the right thalamus, left parieto-occipital junction region and right frontal paramedian brain. CT angiogram head and neck showed no significant carotid stenosis or proximal anterior circulation disease. There was noted occluded left vertebral throughout much of its course likely chronic given the old left cerebellar infarct. Left PCA occlusion. Venous Dopplers lower extremities showed superficial thrombosis noted in the left lesser saphenous vein and in the varicosities of the Left calf. Echocardiogram with ejection fraction of 60% grade 1 diastolic dysfunction. Neurology continues to follow and await plan for TEE loop recorder. Patient remains on aspirin and Plavix for CVA prophylaxis. Subcutaneous Lovenox for DVT prophylaxis. Physical and occupational therapy evaluations completed with recommendations of  physical medicine rehabilitation consult.   Review of Systems  Constitutional: Negative for chills and fever.  HENT: Negative for hearing loss and tinnitus.   Eyes: Negative for blurred vision and double vision.  Respiratory: Negative for cough and shortness of breath.   Cardiovascular: Positive for leg swelling. Negative for chest pain and palpitations.  Gastrointestinal: Positive for constipation. Negative for nausea and vomiting.  Genitourinary: Negative for hematuria.  Musculoskeletal: Positive for myalgias. Negative for joint pain.  Skin: Negative for rash.  Neurological: Positive for sensory change and weakness. Negative for seizures.       Intermittent headaches  Psychiatric/Behavioral: The patient has insomnia.   All other systems reviewed and are negative.  Past Medical History:  Diagnosis Date  . Hypertension   . Stroke [facility])    Past Surgical History:  Procedure Laterality Date  . ABDOMINAL HYSTERECTOMY    . EYE SURGERY     History reviewed. No pertinent family history. Social History:  reports that she has never smoked. She has never used smokeless tobacco. She reports that she does not drink alcohol or use drugs. Allergies: No Known Allergies Medications Prior to Admission  Medication Sig Dispense Refill  . aspirin 325 MG tablet Take 325 mg by mouth daily.    . carvedilol (COREG) 25 MG tablet Take 25 mg by mouth daily.    . clopidogrel (PLAVIX) 75 MG tablet Take 75 mg by mouth daily.    Marland Kitchen latanoprost (XALATAN) 0.005 % ophthalmic solution Place 1 drop into both eyes every morning.    Marland Kitchen losartan (COZAAR) 50 MG tablet Take 50 mg by mouth daily.    . simvastatin (ZOCOR) 20 MG tablet Take 20 mg by mouth daily.    . temazepam (RESTORIL) 15 MG capsule Take  15 mg by mouth at bedtime.    . verapamil (CALAN-SR) 240 MG CR tablet Take 240 mg by mouth at bedtime.      Home: Home Living Family/patient expects to be discharged to:: Private residence Living Arrangements:  Children Available Help at Discharge: Family, Available PRN/intermittently Type of Home: House Home Access: Level entry Home Layout: One level Bathroom Shower/Tub: Engineer, manufacturing systems: Standard Bathroom Accessibility: Yes Home Equipment: Wheelchair - manual  Lives With: Daughter  Functional History: Prior Function Level of Independence: Needs assistance Gait / Transfers Assistance Needed: Daughter reports that she has been doing ~75% of the work when assisting her. Could take a few steps from bed to w/c and then from w/c to toilet.  ADL's / Homemaking Assistance Needed: Pt able to assist minimally with sponge bathing and self-care but daughter reports she has been doing most of it for her since her stroke 3 weeks ago Functional Status:  Mobility: Bed Mobility Overal bed mobility: Needs Assistance Bed Mobility: Sidelying to Sit Rolling: Mod assist Sidelying to sit: Mod assist Sit to sidelying: Mod assist General bed mobility comments: Cues for technique; Heavy mod assist to elevate trunk to sit; manual contacts at pelvis and shoulder to faciliate shifting weight onto R pelvis/ischial tuberosity as she stalled in sitting in a heavy L lean Transfers Overall transfer level: Needs assistance Equipment used: 2 person hand held assist, Rolling walker (2 wheeled) Transfers: Sit to/from Stand, Stand Pivot Transfers Sit to Stand: Mod assist Stand pivot transfers: Max assist General transfer comment: Cues for hand placement, forward weight shift, and technique; heavy mod assist to power up with close guard of L knee; Max assist to transfer to chair as the L knee did buckle; performed 3 reps of sit to stand: with one person assist, to RW and to sink Ambulation/Gait Ambulation/Gait assistance: +2 physical assistance, Mod assist Ambulation Distance (Feet):  (2 steps forward, chair pulled behind) Assistive device: Rolling walker (2 wheeled) Gait Pattern/deviations: Step-to pattern,  Decreased stance time - left, Decreased step length - left (Heavy L lean) General Gait Details: Heavy L lean, requiring heavy mod assist to support while advancing R foot; mod assist to advance L foot as well; difficulty maintianing fully upright standing    ADL: ADL Overall ADL's : Needs assistance/impaired Eating/Feeding: Set up, Sitting Grooming: Set up, Sitting Upper Body Bathing: Moderate assistance Lower Body Bathing: Maximal assistance Upper Body Dressing : Minimal assistance Lower Body Dressing: Maximal assistance Toilet Transfer: Maximal assistance, +2 for physical assistance, +2 for safety/equipment, BSC, RW, Stand-pivot Functional mobility during ADLs: +2 for physical assistance, Moderate assistance General ADL Comments: no actual transfers were attempted due to elevated blood pressure (182/80) spoke with RN who recommended no OOB activity at this time)  Cognition: Cognition Overall Cognitive Status: Impaired/Different from baseline Arousal/Alertness: Awake/alert Orientation Level: Oriented X4 Attention: Focused, Sustained, Selective Focused Attention: Impaired Focused Attention Impairment: Verbal basic, Functional basic Sustained Attention: Impaired Sustained Attention Impairment: Verbal basic, Functional basic Selective Attention: Impaired Selective Attention Impairment: Verbal basic, Functional basic Memory: Impaired Memory Impairment: Decreased short term memory, Storage deficit, Decreased recall of new information Decreased Short Term Memory: Verbal basic, Functional basic Awareness: Impaired Awareness Impairment: Intellectual impairment Problem Solving: Impaired Problem Solving Impairment: Verbal basic, Functional basic Executive Function: Sequencing, Initiating Sequencing: Impaired Sequencing Impairment: Verbal basic, Functional basic Initiating: Impaired Initiating Impairment: Verbal basic, Functional basic Safety/Judgment:  Impaired Cognition Arousal/Alertness: Awake/alert Behavior During Therapy: WFL for tasks assessed/performed Overall Cognitive Status: Impaired/Different from baseline Area  of Impairment: Following commands, Safety/judgement, Problem solving Following Commands: Follows one step commands with increased time, Follows one step commands inconsistently Safety/Judgement: Decreased awareness of safety, Decreased awareness of deficits Problem Solving: Slow processing, Decreased initiation, Difficulty sequencing, Requires verbal cues General Comments: Emotionally labile during session; discussed living in [place], and became tearfull when talking about moving to [place]  Blood pressure (!) 204/76, pulse 76, temperature 98.4 F (36.9 C), temperature source Oral, resp. rate 18, height 5\' 2"  (1.575 m), weight 63 kg (138 lb 14.2 oz), SpO2 100 %. Physical Exam  Vitals reviewed. Constitutional: She appears well-developed.  HENT:  Head: Normocephalic.  Eyes: EOM are normal.  Neck: Normal range of motion. Neck supple. No thyromegaly present.  Cardiovascular: Normal rate and regular rhythm.   Respiratory: Effort normal and breath sounds normal. No respiratory distress.  GI: Soft. Bowel sounds are normal. She exhibits no distension.  Neurological: She is alert.  Mood is a bit flat but appropriate. She provides her main age and date of birth. Follows simple commands. Moves all 4's. A little more difficulty intiating with left arm/leg while working with therapy.   Skin: Skin is warm and dry.    Results for orders placed or performed during the hospital encounter of 03/14/17 (from the past 24 hour(s))  CBC     Status: Abnormal   Collection Time: 03/17/17  5:04 AM  Result Value Ref Range   WBC 6.3 4.0 - 10.5 K/uL   RBC 3.85 (L) 3.87 - 5.11 MIL/uL   Hemoglobin 10.5 (L) 12.0 - 15.0 g/dL   HCT 16.1 (L) 09.6 - 04.5 %   MCV 83.6 78.0 - 100.0 fL   MCH 27.3 26.0 - 34.0 pg   MCHC 32.6 30.0 - 36.0 g/dL    RDW 40.9 81.1 - 91.4 %   Platelets 232 150 - 400 K/uL   Ct Angio Head W Or Wo Contrast  Result Date: 03/15/2017 CLINICAL DATA:  LEFT-sided weakness. History of cerebral infarction 3 weeks ago. MRI shows multiple vascular territories involved. EXAM: CT ANGIOGRAPHY HEAD AND NECK TECHNIQUE: Multidetector CT imaging of the head and neck was performed using the standard protocol during bolus administration of intravenous contrast. Multiplanar CT image reconstructions and MIPs were obtained to evaluate the vascular anatomy. Carotid stenosis measurements (when applicable) are obtained utilizing NASCET criteria, using the distal internal carotid diameter as the denominator. CONTRAST:  Isovue 370, 50 mL. COMPARISON:  MR brain 03/14/2017.  CT head 03/14/2017. FINDINGS: CT HEAD Calvarium and skull base: No fracture or destructive lesion. Mastoids and middle ears are grossly clear. Paranasal sinuses: Imaged portions are clear. Orbits: Negative. Brain: The multifocal RIGHT frontal, LEFT occipital, and RIGHT thalamus subacute infarcts are better seen on MR. There is global atrophy with chronic microvascular ischemic change. No hemorrhage is present. Chronic LEFT cerebellar infarct redemonstrated. CTA NECK Aortic arch: Standard branching. Imaged portion shows no evidence of aneurysm or dissection. No significant stenosis of the major arch vessel origins although widespread calcifications present along with some soft plaque involving not only the vessel origins both transverse arch. Right carotid system: Advanced dolichoectasia. Predominantly calcific plaque along the distal common carotid artery, and at the bifurcation but no measurable stenosis. No evidence of dissection, stenosis (50% or greater) or occlusion. Left carotid system: Advanced dolichoectasia. Predominantly calcific plaque along the distal common carotid artery and at the bifurcation, but no measurable stenosis. No evidence of dissection, stenosis (50% or  greater) or occlusion. Vertebral arteries: RIGHT vertebral is dominant/patent from  its origin to the basilar confluence. LEFT vertebral is occluded just above its origin, and is not patent in the neck. Nonvascular soft tissues: Cervical spondylosis. No lung lesions. Normal thyroid. CTA HEAD Anterior circulation: Calcific plaque in both cavernous carotid arteries. Possible 50% stenosis on the LEFT. No proximal anterior or middle cerebral artery stenosis is evident. No MCA branch occlusion. Both distal anterior cerebral arteries patent. No aneurysm, or vascular malformation. Posterior circulation: The basilar artery is diffusely small, which likely relates to hypoplasia from BILATERAL fetal PCA origins. The basilar is also mildly irregular, and superimposed atherosclerotic disease is suspected. The RIGHT PCA is widely patent. The LEFT PCA is severely diseased proximally, and does not significantly fill in its P2 or P3 segments. No visible cerebellar branch occlusion. Probable retrograde flow in the LEFT vertebral to supply the PICA. No aneurysm, or vascular malformation. Venous sinuses: As permitted by contrast timing, patent. Anatomic variants: BILATERAL fetal PCA. Delayed phase: Gyriform enhancement of the medial/superior subacute RIGHT frontal infarct reflects its subacute nature. Similar more subtle findings in the LEFT occipital cortex. Review of the MIP images confirms the above findings IMPRESSION: No significant carotid stenosis or proximal anterior circulation disease. 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 LEFT PCA occlusion. Subacute RIGHT frontal infarct shows mild postcontrast enhancement consistent with an insult a few weeks ago. Electronically Signed   By: Elsie Stain M.D.   On: 03/15/2017 13:59   Ct Angio  Neck W Or Wo Contrast  Result Date: 03/15/2017 CLINICAL DATA:  LEFT-sided weakness. History of cerebral infarction 3 weeks ago. MRI shows multiple vascular territories involved. EXAM: CT ANGIOGRAPHY HEAD AND NECK TECHNIQUE: Multidetector CT imaging of the head and neck was performed using the standard protocol during bolus administration of intravenous contrast. Multiplanar CT image reconstructions and MIPs were obtained to evaluate the vascular anatomy. Carotid stenosis measurements (when applicable) are obtained utilizing NASCET criteria, using the distal internal carotid diameter as the denominator. CONTRAST:  Isovue 370, 50 mL. COMPARISON:  MR brain 03/14/2017.  CT head 03/14/2017. FINDINGS: CT HEAD Calvarium and skull base: No fracture or destructive lesion. Mastoids and middle ears are grossly clear. Paranasal sinuses: Imaged portions are clear. Orbits: Negative. Brain: The multifocal RIGHT frontal, LEFT occipital, and RIGHT thalamus subacute infarcts are better seen on MR. There is global atrophy with chronic microvascular ischemic change. No hemorrhage is present. Chronic LEFT cerebellar infarct redemonstrated. CTA NECK Aortic arch: Standard branching. Imaged portion shows no evidence of aneurysm or dissection. No significant stenosis of the major arch vessel origins although widespread calcifications present along with some soft plaque involving not only the vessel origins both transverse arch. Right carotid system: Advanced dolichoectasia. Predominantly calcific plaque along the distal common carotid artery, and at the bifurcation but no measurable stenosis. No evidence of dissection, stenosis (50% or greater) or occlusion. Left carotid system: Advanced dolichoectasia. Predominantly calcific plaque along the distal common carotid artery and at the bifurcation, but no measurable stenosis. No evidence of dissection, stenosis (50% or greater) or occlusion. Vertebral arteries: RIGHT vertebral is  dominant/patent from its origin to the basilar confluence. LEFT vertebral is occluded just above its origin, and is not patent in the neck. Nonvascular soft tissues: Cervical spondylosis. No lung lesions. Normal thyroid. CTA HEAD Anterior circulation: Calcific plaque in both cavernous carotid arteries. Possible 50% stenosis on the LEFT. No proximal anterior  or middle cerebral artery stenosis is evident. No MCA branch occlusion. Both distal anterior cerebral arteries patent. No aneurysm, or vascular malformation. Posterior circulation: The basilar artery is diffusely small, which likely relates to hypoplasia from BILATERAL fetal PCA origins. The basilar is also mildly irregular, and superimposed atherosclerotic disease is suspected. The RIGHT PCA is widely patent. The LEFT PCA is severely diseased proximally, and does not significantly fill in its P2 or P3 segments. No visible cerebellar branch occlusion. Probable retrograde flow in the LEFT vertebral to supply the PICA. No aneurysm, or vascular malformation. Venous sinuses: As permitted by contrast timing, patent. Anatomic variants: BILATERAL fetal PCA. Delayed phase: Gyriform enhancement of the medial/superior subacute RIGHT frontal infarct reflects its subacute nature. Similar more subtle findings in the LEFT occipital cortex. Review of the MIP images confirms the above findings IMPRESSION: No significant carotid stenosis or proximal anterior circulation disease. 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 LEFT PCA occlusion. Subacute RIGHT frontal infarct shows mild postcontrast enhancement consistent with an insult a few weeks ago. Electronically Signed   By: Elsie Stain M.D.   On: 03/15/2017 13:59   Dg Knee Complete 4 Views Left  Result Date: 03/16/2017 CLINICAL DATA:   Patient with persistent left knee pain for 6 months. EXAM: LEFT KNEE - COMPLETE 4+ VIEW COMPARISON:  None. FINDINGS: Normal anatomic alignment. No evidence for acute fracture dislocation. Medial compartment joint space narrowing and osteophytosis. Vascular calcifications. IMPRESSION: No acute osseous abnormality. Degenerative changes, most pronounced within the medial compartment. Electronically Signed   By: Annia Belt M.D.   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 Does the need for close, 24 hr/day medical supervision in concert with the patient's rehab needs make it unreasonable for this patient to be served in a less intensive setting? Yes 2. Co-Morbidities requiring supervision/potential complications: HTN, post-stroke sequelae 3. Due to bladder management, bowel management, safety, skin/wound care, disease management, medication administration, pain management and patient education, does the patient require 24 hr/day rehab nursing? Yes 4. Does the patient require coordinated care of a physician, rehab nurse, PT (1-2 hrs/day, 5 days/week), OT (1-2 hrs/day, 5 days/week) and SLP (1-2 hrs/day, 5 days/week) to address physical and functional deficits in the context of the above medical diagnosis(es)? Yes Addressing deficits in the following areas: balance, endurance, locomotion, strength, transferring, bowel/bladder control, bathing, dressing, feeding, grooming, toileting, cognition and psychosocial support 5. Can the patient actively participate in an intensive therapy program of at least 3 hrs of therapy per day at least 5 days per week? Yes 6. The potential for patient to make measurable gains while on inpatient rehab is excellent 7. Anticipated functional outcomes upon discharge from inpatient rehab are modified independent and supervision  with PT, modified independent and supervision with OT, modified independent  and supervision with SLP. 8. Estimated rehab length of stay to reach the above functional goals is: 9-14 days 9. Does the patient have adequate social supports and living environment to accommodate these discharge functional goals? Yes and Potentially 10. Anticipated D/C setting: Home 11. Anticipated post D/C treatments: HH therapy and Outpatient therapy 12. Overall Rehab/Functional Prognosis: excellent  RECOMMENDATIONS: This patient's condition is appropriate for continued rehabilitative care in the following setting: CIR Patient has agreed to participate in recommended program. Yes Note that insurance prior  authorization may be required for reimbursement for recommended care.  Comment: Rehab Admissions Coordinator to follow up.  Thanks,  Ranelle OysterZachary T. Jathniel Smeltzer, MD, [place] DomFAAPMR    Charlton AmorANGIULLI,DANIEL J., PA-C 03/17/2017

## 2017-03-18 DIAGNOSIS — H532 Diplopia: Secondary | ICD-10-CM

## 2017-03-18 LAB — COMPREHENSIVE METABOLIC PANEL
ALK PHOS: 63 U/L (ref 38–126)
ALT: 24 U/L (ref 14–54)
ANION GAP: 8 (ref 5–15)
AST: 30 U/L (ref 15–41)
Albumin: 3.2 g/dL — ABNORMAL LOW (ref 3.5–5.0)
BILIRUBIN TOTAL: 0.6 mg/dL (ref 0.3–1.2)
BUN: 15 mg/dL (ref 6–20)
CO2: 26 mmol/L (ref 22–32)
CREATININE: 1.04 mg/dL — AB (ref 0.44–1.00)
Calcium: 9.8 mg/dL (ref 8.9–10.3)
Chloride: 107 mmol/L (ref 101–111)
GFR, EST NON AFRICAN AMERICAN: 53 mL/min — AB (ref >60)
Glucose, Bld: 109 mg/dL — ABNORMAL HIGH (ref 65–99)
Potassium: 4.5 mmol/L (ref 3.5–5.1)
SODIUM: 141 mmol/L (ref 135–145)
Total Protein: 6.9 g/dL (ref 6.5–8.1)

## 2017-03-18 LAB — CBC
HCT: 31.7 % — ABNORMAL LOW (ref 36.0–46.0)
Hemoglobin: 10.6 g/dL — ABNORMAL LOW (ref 12.0–15.0)
MCH: 28.3 pg (ref 26.0–34.0)
MCHC: 33.4 g/dL (ref 30.0–36.0)
MCV: 84.8 fL (ref 78.0–100.0)
PLATELETS: 223 10*3/uL (ref 150–400)
RBC: 3.74 MIL/uL — AB (ref 3.87–5.11)
RDW: 13.6 % (ref 11.5–15.5)
WBC: 6.1 10*3/uL (ref 4.0–10.5)

## 2017-03-18 MED ORDER — LOSARTAN POTASSIUM 50 MG PO TABS
100.0000 mg | ORAL_TABLET | Freq: Every day | ORAL | Status: DC
  Administered 2017-03-18 – 2017-03-19 (×2): 100 mg via ORAL
  Filled 2017-03-18 (×2): qty 2

## 2017-03-18 NOTE — Progress Notes (Signed)
STROKE TEAM PROGRESS NOTE   SUBJECTIVE (INTERVAL HISTORY) Daughter is at the bedside. No acute event overnight. TEE scheduled for tomorrow. Pt LLE strength improved some. Cardiology consult pending.   OBJECTIVE Temp:  [98 F (36.7 C)-98.6 F (37 C)] 98.6 F (37 C) (03/27 0810) Pulse Rate:  [58-118] 118 (03/27 0810) Cardiac Rhythm: Normal sinus rhythm (03/27 0800) Resp:  [16-18] 16 (03/27 0528) BP: (178-201)/(53-84) 197/84 (03/27 0810) SpO2:  [98 %-100 %] 100 % (03/27 0810)  CBC:   Recent Labs Lab 03/14/17 1152 03/17/17 0504 03/18/17 0346  WBC 8.1 6.3 6.1  NEUTROABS 5.9  --   --   HGB 11.8* 10.5* 10.6*  HCT 34.8* 32.2* 31.7*  MCV 81.7 83.6 84.8  PLT 280 232 223    Basic Metabolic Panel:   Recent Labs Lab 03/14/17 1152 03/18/17 1021  NA 137 141  K 4.0 4.5  CL 106 107  CO2 25 26  GLUCOSE 119* 109*  BUN 31* 15  CREATININE 1.18* 1.04*  CALCIUM 9.5 9.8    Lipid Panel:     Component Value Date/Time   CHOL 131 03/15/2017 0725   TRIG 175 (H) 03/15/2017 0725   HDL 34 (L) 03/15/2017 0725   CHOLHDL 3.9 03/15/2017 0725   VLDL 35 03/15/2017 0725   LDLCALC 62 03/15/2017 0725   HgbA1c:  Lab Results  Component Value Date   HGBA1C 5.7 (H) 03/15/2017   Urine Drug Screen: No results found for: LABOPIA, COCAINSCRNUR, LABBENZ, AMPHETMU, THCU, LABBARB    IMAGING I have personally reviewed the radiological images below and agree with the radiology interpretations.  Ct Head Wo Contrast 03/14/2017 Cerebral atrophy, ventriculomegaly and periventricular white matter disease. Suspect lacunar type left basal ganglia infarcts. No hemispheric infarction or intracranial hemorrhage. Moderate vascular calcifications.   Mr Brain Wo Contrast 03/14/2017 Multiple subacute infarctions in multiple vascular territories, suggesting embolic disease from the heart or ascending aorta. There is involvement of the right thalamus, left parieto-occipital junction region and right frontal para  median brain. Chronic small-vessel ischemic changes elsewhere as described. Atrophy. Ventricular prominence probably secondary to ex vacuo enlargement. Normal pressure hydrocephalus not excluded, but not specifically favored.   CTA head ane neck No significant carotid stenosis or proximal anterior circulation disease. 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 LEFT PCA occlusion. Subacute RIGHT frontal infarct shows mild postcontrast enhancement consistent with an insult a few weeks ago.  TTE - Left ventricle: The cavity size was normal. Wall thickness was   normal. Systolic function was normal. The estimated ejection   fraction was in the range of 55% to 60%. Wall motion was normal;   there were no regional wall motion abnormalities. Doppler   parameters are consistent with abnormal left ventricular   relaxation (grade 1 diastolic dysfunction). Doppler parameters   are consistent with high ventricular filling pressure. - Aortic valve: Valve area (VTI): 1.88 cm^2. Valve area (Vmax):   1.81 cm^2. Valve area (Vmean): 1.64 cm^2. - Mitral valve: Mildly calcified annulus. Normal thickness leaflets   . There was mild regurgitation. - Left atrium: The atrium was severely dilated. - Atrial septum: No defect or patent foramen ovale was identified. - Technically adequate study.  LE venous doppler - There is no DVT noted in the left lower extremity.  There is superficial thrombosis noted in the left lesser saphenous vein and in varicosities in  the left calf.    PHYSICAL EXAM Temp:  [98 F (36.7 C)-98.6 F (37 C)] 98.6 F (37 C) (03/27 0810) Pulse Rate:  [58-118] 118 (03/27 0810) Resp:  [16-18] 16 (03/27 0528) BP: (178-201)/(53-84) 197/84 (03/27 0810) SpO2:  [98 %-100 %] 100 % (03/27 0810)  General - Well  nourished, well developed, in no apparent distress.  Ophthalmologic - Fundi not visualized due to eye movement.  Cardiovascular - Regular rate and rhythm.  Mental Status -  Level of arousal and orientation to month and person were intact, however, not orientated to place and year. Language including expression, naming, repetition, comprehension was assessed and found intact, mild dysarthria.  Cranial Nerves II - XII - II - Visual field intact OU. III, IV, VI - Extraocular movements intact. V - Facial sensation intact bilaterally. VII - Facial movement intact bilaterally. VIII - Hearing & vestibular intact bilaterally. X - Palate elevates symmetrically. XI - Chin turning & shoulder shrug intact bilaterally. XII - Tongue protrusion intact.  Motor Strength - [name] strength was normal in RUE and RLE, however, left UE 4/5 and LLE 2/5 proximal, 3/5 keen extension with DF 3+/5 distally and pronator drift was present.  Bulk was normal and fasciculations were absent.   Motor Tone - Muscle tone was assessed at the neck and appendages and was normal.  Reflexes - [name] reflexes were 1+ in all extremities and she had no pathological reflexes.  Sensory - Light touch, temperature/pinprick were assessed and were symmetrical.    Coordination - The patient had normal movements in the hands with no ataxia or dysmetria.  Tremor was absent.  Gait and Station - deferred   ASSESSMENT/PLAN Ms. Celisse Gerhart is a 71 y.o. female with history of hypertension and a stroke in [place] 3 weeks ago with subsequent left hemiparesis, presenting with diplopia and headache. She did not receive IV t-PA due to recent stroke history.   Strokes:  Left MCA, right thalamus, right ACA infarct, embolic pattern - source unclear.  Resultant  Disorientation, left hemiparesis  MRI - Multiple subacute infarctions including Left MCA, right thalamus, right ACA  suggesting embolic disease  CTA H&N - left  PCA occlusion, b/l fetal PCA, BA hypoplastic  2D Echo - EF 55-60%  LE venous Dopplers - no DVT  recommend TEE and loop recorder for cardioembolic work up - pt does have hx of arrhythmia long time ago in [place]  LDL - 62  HgbA1c - 5.7  VTE prophylaxis - Lovenox Diet Heart Room service appropriate? Yes; Fluid consistency: Thin  aspirin 325 mg daily and clopidogrel 75 mg daily prior to admission, now on aspirin 325 mg daily and clopidogrel 75 mg daily.   Patient counseled to be compliant with her antithrombotic medications  Ongoing aggressive stroke risk factor management  Therapy recommendations: CIR  Disposition:  Pending  Hx of arrhythmia  Pt stated that she had arrhythmia long time ago, but not recently  Not sure about what type of arrhythmia  On verapmil but not sure what the reason she is on it  On tele, pt had 5 beat of SVT, put back on beta blocker  Cardiology consulted   If no afib found on tele, will recommend TEE and loop recorder for cardioembolic work up  Hypertension  Blood pressure is running high  Coreg resumed.  Long-term BP goal normotensive  Hyperlipidemia  Home meds:  Zocor 20 mg daily resumed in hospital  LDL 62, goal < 70  Continue statin at discharge  Other Stroke Risk Factors  Advanced age  Other Active Problems  Mild anemia - 11.8 / 34.8  UTI - on rocephin started 03/15/17  Hospital day # 3  Marvel PlanJindong Carey Johndrow, MD PhD Stroke Neurology 03/18/2017 12:46 PM    To contact Stroke Continuity provider, please refer to [email or web address]. After hours, contact General Neurology

## 2017-03-18 NOTE — Progress Notes (Signed)
PROGRESS NOTE   Janyth Riera  ZOX:[id number]    DOB: Mar 23, 1946    DOA: 03/14/2017  PCP: Pcp Not In System   I have briefly reviewed patients previous medical records in [facility].  Brief Narrative:  71 year old female, lives in [place] recent hospitalization in [place] on 02/27/17 for 5 days with CVA and left hemiplegia, HTN, former tobacco abuse, presented to ED with complaints of headache, double vision and left-sided body pains. History provided by patient and daughter at bedside. Daughter indicates that during the latter part of hospitalization, she traveled from [place] to [place] to assist her mother. Mother was discharged from the hospital without any home health services and she was doing the best she can with physical therapy. Patient developed bedsores but healed with TLC. However she continued to be confused and complained of headache and left-sided body pain. They then traveled to the [place] and came to the ED the same day when there landed in [place]. CT head confirmed suspected lacunar type left basilar ganglia infarcts without hemorrhage. Neurology was consulted. Stroke workup ongoing. Possible TEE 03/17/17. CIR consulted.   Assessment & Plan:   Active Problems:   TIA (transient ischemic attack)   History of CVA (cerebrovascular accident)   HTN (hypertension)   HLD (hyperlipidemia)   Tobacco abuse, in remission   Acute cystitis without hematuria   Stroke [facility])   Strokes:  Left MCA, right thalamus, right ACA infarct, embolic pattern - source unclear.  Resultant  Disorientation, left hemiparesis. Disorientation resolved. Left hemiparesis improving.  MRI - Multiple subacute infarctions including Left MCA, right thalamus, right ACA  suggesting embolic disease  CTA H&N - left PCA occlusion, b/l fetal PCA, BA hypoplastic, occluded left vertebral artery  2D Echo - EF 55-60%  Left lower extremity venous Doppler: No DVT noted. There is a superficial  thrombosis noted in the left lesser saphenous vein and several thrombosed varicosities in the left calf.  Neurology recommended TEE and loop recorder for cardioembolic work up - pt does have hx of arrhythmia long time ago in [place]    TEE/ILR  will be performed on 3/28.    LDL - 62  HgbA1c - 5.7  aspirin 325 mg daily and clopidogrel 75 mg daily prior to admission, now on aspirin 325 mg daily and clopidogrel 75 mg daily.   Patient counseled to be compliant with her antithrombotic medications  Ongoing aggressive stroke risk factor management  Therapy recommendations: CIR      SVT  Pt stated that she had arrhythmia long time ago, but not recently  Not sure about what type of arrhythmia. Patient was on carvedilol, verapamil PTA-unclear reason.  Neurology recommended TEE and loop recorder. Please see discussion above  Sinus rhythm on monitor. Periods of intermittent but transient nonsustained narrow complex tachycardia, likely SVT >requested cardiology to see prior to procedures.  Discussed with Dr. Roda Shutters on 3/25> okay to resume medications and better control blood pressure. Resumed carvedilol and verapamil.  Hypertension  Blood pressure is running high              Permissive hypertension (OK if < 220/120) but gradually normalize in 5-7 days              Long-term BP goal normotensive  Continue carvedilol to 12.5 MG twice a day (was on 25 MG daily) and   verapamil 240 mg at bedtime. Also restart Cozaar as blood pressure still uncontrolled. Check BMP   Hyperlipidemia  Home meds:  Zocor 20 mg daily resumed in hospital  LDL 62, goal < 70  Continue statin at discharge  Acute cystitis, uncomplicated (Escherichia coli and Proteus) - Completed 3 days ceftriaxone. Urine culture results as above  Left knee/leg pain - Lower extremity venous Doppler negative. Gives history of fall at home in [place] 3-4 weeks back. Does not seem neuropathic pain. Possibly arthritic  pain. X-ray of left knee without acute findings. Symptomatic treatment. Improved.  Anemia Follow CBCs periodically.    DVT prophylaxis: Lovenox Code Status: Full Family Communication: Discussed in detail with patient's son at bedside. Disposition: TEE/ILR in am   Consultants:  Neurology  Rehabilitation M.D.-pending  Procedures:  2-D echo 03/15/17: Study Conclusions  - Left ventricle: The cavity size was normal. Wall thickness was   normal. Systolic function was normal. The estimated ejection   fraction was in the range of 55% to 60%. Wall motion was normal;   there were no regional wall motion abnormalities. Doppler   parameters are consistent with abnormal left ventricular   relaxation (grade 1 diastolic dysfunction). Doppler parameters   are consistent with high ventricular filling pressure. - Aortic valve: Valve area (VTI): 1.88 cm^2. Valve area (Vmax):   1.81 cm^2. Valve area (Vmean): 1.64 cm^2. - Mitral valve: Mildly calcified annulus. Normal thickness leaflets   . There was mild regurgitation. - Left atrium: The atrium was severely dilated. - Atrial septum: No defect or patent foramen ovale was identified. - Technically adequate study.  Antimicrobials:  Ceftriaxone   Subjective:  Hypertensive this morning  Objective:  Vitals:   03/17/17 2154 03/18/17 0057 03/18/17 0528 03/18/17 0810  BP: (!) 178/53 (!) 198/79 (!) 182/60 (!) 197/84  Pulse: 69 70 (!) 58 (!) 118  Resp: 16 16 16    Temp: 98.6 F (37 C) 98.4 F (36.9 C) 98 F (36.7 C) 98.6 F (37 C)  TempSrc: Oral Axillary Oral Oral  SpO2: 98% 100% 100% 100%  Weight:      Height:        Examination:  General exam: Pleasant elderly female lying comfortably propped up in bed. Respiratory system: Clear to auscultation. Respiratory effort normal. Cardiovascular system: S1 & S2 heard, RRR. No JVD, murmurs, rubs, gallops or clicks. No pedal edema. telemetry: Sinus rhythm. Couple of episodes of nonsustained  narrow complex tachycardia, likely SVT (on 3/25 at 12:16 PM and 11:48 PM.) Gastrointestinal system: Abdomen is nondistended, soft and nontender. No organomegaly or masses felt. Normal bowel sounds heard. Central nervous system: Alert and oriented. No focal neurological deficits. Extremities: 5 x 5 power in right limbs. 4+ by 5 power in left upper extremity. 2 x 5 power in left lower extremity. No acute MSS findings on exam of left knee and left leg. Skin: No rashes, lesions or ulcers Psychiatry: Judgement and insight impaired. Mood & affect appropriate. ?? Some confusion at times.    Data Reviewed: I have personally reviewed following labs and imaging studies  CBC:  Recent Labs Lab 03/14/17 1152 03/17/17 0504 03/18/17 0346  WBC 8.1 6.3 6.1  NEUTROABS 5.9  --   --   HGB 11.8* 10.5* 10.6*  HCT 34.8* 32.2* 31.7*  MCV 81.7 83.6 84.8  PLT 280 232 223   Basic Metabolic Panel:  Recent Labs Lab 03/14/17 1152  NA 137  K 4.0  CL 106  CO2 25  GLUCOSE 119*  BUN 31*  CREATININE 1.18*  CALCIUM 9.5   Liver Function Tests:  Recent Labs Lab 03/14/17 1152  AST 22  ALT 21  ALKPHOS 63  BILITOT 0.5  PROT 7.1  ALBUMIN 3.3*   Coagulation Profile:  Recent Labs Lab 03/14/17 1152  INR 1.13   Cardiac Enzymes: No results for input(s): CKTOTAL, CKMB, CKMBINDEX, TROPONINI in the last 168 hours. HbA1C: No results for input(s): HGBA1C in the last 72 hours. CBG: No results for input(s): GLUCAP in the last 168 hours.  Recent Results (from the past 240 hour(s))  Urine culture     Status: Abnormal   Collection Time: 03/14/17  1:06 PM  Result Value Ref Range Status   Specimen Description URINE, CATHETERIZED  Final   Special Requests NONE  Final   Culture (A)  Final    40,000 COLONIES/mL ESCHERICHIA COLI 20,000 COLONIES/mL PROTEUS MIRABILIS    Report Status 03/17/2017 FINAL  Final   Organism ID, Bacteria ESCHERICHIA COLI (A)  Final   Organism ID, Bacteria PROTEUS MIRABILIS (A)   Final      Susceptibility   Escherichia coli - MIC*    AMPICILLIN <=2 SENSITIVE Sensitive     CEFAZOLIN <=4 SENSITIVE Sensitive     CEFTRIAXONE <=1 SENSITIVE Sensitive     CIPROFLOXACIN <=0.25 SENSITIVE Sensitive     GENTAMICIN <=1 SENSITIVE Sensitive     IMIPENEM <=0.25 SENSITIVE Sensitive     NITROFURANTOIN <=16 SENSITIVE Sensitive     TRIMETH/SULFA >=320 RESISTANT Resistant     AMPICILLIN/SULBACTAM <=2 SENSITIVE Sensitive     PIP/TAZO <=4 SENSITIVE Sensitive     Extended ESBL NEGATIVE Sensitive     * 40,000 COLONIES/mL ESCHERICHIA COLI   Proteus mirabilis - MIC*    AMPICILLIN <=2 SENSITIVE Sensitive     CEFAZOLIN <=4 SENSITIVE Sensitive     CEFTRIAXONE <=1 SENSITIVE Sensitive     CIPROFLOXACIN <=0.25 SENSITIVE Sensitive     GENTAMICIN <=1 SENSITIVE Sensitive     IMIPENEM 2 SENSITIVE Sensitive     NITROFURANTOIN 128 RESISTANT Resistant     TRIMETH/SULFA <=20 SENSITIVE Sensitive     AMPICILLIN/SULBACTAM <=2 SENSITIVE Sensitive     PIP/TAZO <=4 SENSITIVE Sensitive     * 20,000 COLONIES/mL PROTEUS MIRABILIS         Radiology Studies: Dg Knee Complete 4 Views Left  Result Date: 03/16/2017 CLINICAL DATA:  Patient with persistent left knee pain for 6 months. EXAM: LEFT KNEE - COMPLETE 4+ VIEW COMPARISON:  None. FINDINGS: Normal anatomic alignment. No evidence for acute fracture dislocation. Medial compartment joint space narrowing and osteophytosis. Vascular calcifications. IMPRESSION: No acute osseous abnormality. Degenerative changes, most pronounced within the medial compartment. Electronically Signed   By: Annia Beltrew  Davis M.D.   On: 03/16/2017 13:17        Scheduled Meds: . aspirin  325 mg Oral Daily  . carvedilol  12.5 mg Oral BID WC  . clopidogrel  75 mg Oral Daily  . enoxaparin (LOVENOX) injection  40 mg Subcutaneous Q24H  . latanoprost  1 drop Both Eyes [id number]  . simvastatin  20 mg Oral Daily  . temazepam  15 mg Oral QHS  . verapamil  240 mg Oral QHS   Continuous  Infusions:    LOS: 3 days     Richarda OverlieABROL,Demri Poulton, MD,   [facility] Pager [phone]  If 7PM-7AM, please contact night-coverage [email or web address] Password [email or web address] 03/18/2017, 10:01 AM

## 2017-03-18 NOTE — Progress Notes (Signed)
qPhysical Therapy Treatment Patient Details Name: Christina [name] MRN: [id number] DOB: [birth year] Today's Date: 03/18/2017    History of Present Illness Pt is a 71 y/o female who presents with L-sided weakness, diplopia, and HA. 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 This was [place]. [place] after she was not improving. MRI of brain revealed no acute changes.     PT Comments    Pt progressing towards physical therapy goals. Session took place in rehab gym on 9M to minimize the distraction of family in the room. Pt was able to demonstrate improved physical performance when family was not present. She achieved a total of 7 feet ambulation distance this session with +2 mod to max assist for balance support, sequencing and safety. Continue to feel that this patient would benefit from continued rehab at the CIR level to maximize functional mobility prior to d/c.   Follow Up Recommendations  CIR;Supervision/Assistance - 24 hour     Equipment Recommendations  Rolling walker with 5" wheels;3in1 (PT)    Recommendations for Other Services Rehab consult     Precautions / Restrictions Precautions Precautions: Fall Precaution Comments: Inconsistent impairments throughout session. Restrictions Weight Bearing Restrictions: No    Mobility  Bed Mobility Overal bed mobility: Needs Assistance Bed Mobility: Sidelying to Sit;Rolling;Sit to Supine Rolling: Min assist Sidelying to sit: Mod assist   Sit to supine: Max assist   General bed mobility comments: Hand-over-hand assist for reaching RUE to L rail. Pt was able to assist more in transition to sitting this session, with max assist provided for return to supine. Pt with overall difficulty with sequencing and increased time required to complete bed mobility.   Transfers Overall transfer level: Needs assistance Equipment used: Rolling walker (2  wheeled) Transfers: Sit to/from Stand Sit to Stand: Mod assist;+2 physical assistance Stand pivot transfers: Mod assist;Max assist;+2 physical assistance       General transfer comment: Increased assist to power-up to full stand in the room in front of family. When pt was alone with therapists, +2 mod assist was provided and pt assisted more in transfer. Increased time and frequent cues required for hand placement on seated surface for safety.   Ambulation/Gait Ambulation/Gait assistance: Max assist;+2 physical assistance Ambulation Distance (Feet): 7 Feet (4', 3') Assistive device: Rolling walker (2 wheeled) Gait Pattern/deviations: Step-to pattern;Decreased stance time - left;Decreased step length - left (Heavy L lean) Gait velocity: Decreased Gait velocity interpretation: Below normal speed for age/gender General Gait Details: Mirror in front of patient for visual feedback of posture and sequencing. Pt required step-by-step cues of improving posture, leaning R, stepping L, stepping R with each attempt. Therapist provided assist to advance LLE. One seated rest break required.   Stairs            Wheelchair Mobility    Modified Rankin (Stroke Patients Only) Modified Rankin (Stroke Patients Only) Pre-Morbid Rankin Score: No symptoms Modified Rankin: Severe disability     Balance Overall balance assessment: Needs assistance Sitting-balance support: Bilateral upper extremity supported;Feet supported Sitting balance-Leahy Scale: Poor Sitting balance - Comments: L lateral lean and poor postural control. Postural control: Left lateral lean Standing balance support: Bilateral upper extremity supported Standing balance-Leahy Scale: Zero Standing balance comment: L lateral lean requiring mod assist +2 with hands on facilitation along with mirror biofeedback to correct.  Cognition Arousal/Alertness: Awake/alert Behavior During Therapy: Flat  affect;WFL for tasks assessed/performed Overall Cognitive Status: Impaired/Different from baseline Area of Impairment: Following commands;Safety/judgement;Attention;Problem solving                   Current Attention Level: Selective   Following Commands: Follows one step commands with increased time;Follows one step commands inconsistently Safety/Judgement: Decreased awareness of safety;Decreased awareness of deficits   Problem Solving: Slow processing;Decreased initiation;Difficulty sequencing;Requires verbal cues;Requires tactile cues General Comments: Multimodal cues required for follow through of tasks. Easily distracted by family in room.       Exercises      General Comments General comments (skin integrity, edema, etc.): Family present in room. Pt taken to rehab gym for treatment to minimize distraction      Pertinent Vitals/Pain Pain Assessment: 0-10 Faces Pain Scale: Hurts little more Pain Location: Pt states her pain is better than it was yesterday but "still bad". Was not able to give a number on the 0-10 pain scale. Pain Descriptors / Indicators: Discomfort Pain Intervention(s): Limited activity within patient's tolerance;Monitored during session;Repositioned    Home Living                      Prior Function            PT Goals (current goals can now be found in the care plan section) Acute Rehab PT Goals Patient Stated Goal: Get better and more independent PT Goal Formulation: With patient/family Time For Goal Achievement: 03/29/17 Potential to Achieve Goals: Good Progress towards PT goals: Progressing toward goals    Frequency    Min 4X/week      PT Plan Current plan remains appropriate    Co-evaluation             End of Session Equipment Utilized During Treatment: Gait belt Activity Tolerance: Patient limited by fatigue Patient left: in bed;with call bell/phone within reach;with bed alarm set;with family/visitor  present Nurse Communication: Mobility status (BP) PT Visit Diagnosis: Pain;Hemiplegia and hemiparesis Hemiplegia - Right/Left: Left Hemiplegia - dominant/non-dominant: Non-dominant Hemiplegia - caused by: Cerebral infarction Pain - Right/Left: Left Pain - part of body: Knee     Time: 1350-1426 PT Time Calculation (min) (ACUTE ONLY): 36 min  Charges:  $Gait Training: 8-22 mins $Neuromuscular Re-education: 8-22 mins                    G Codes:       Conni Slipper, PT, DPT Acute Rehabilitation Services Pager: [phone]    Marylynn Pearson 03/18/2017, 3:06 PM

## 2017-03-18 NOTE — Progress Notes (Signed)
Rehab admissions - I met with patient and her daughter today.  Patient had medicaid in [place], but has no insurance here in [place].  Daughter would like inpatient rehab and then home with daughter.  Patient needs TEE completed prior to inpatient rehab admission.  I will follow up again tomorrow.  Call me for questions.  #[phone]

## 2017-03-19 ENCOUNTER — Encounter (HOSPITAL_COMMUNITY)
Admission: EM | Disposition: A | Discharge status: Rehabilitation Facility | Payer: Self-pay | Source: Home / Self Care | Attending: Internal Medicine

## 2017-03-19 ENCOUNTER — Inpatient Hospital Stay (HOSPITAL_COMMUNITY)
Admission: RE | Admit: 2017-03-19 | Discharge: 2017-04-05 | DRG: 092 | Disposition: A | Discharge status: Home Health | Payer: Medicaid Other | Source: Intra-hospital | Attending: Physical Medicine & Rehabilitation | Admitting: Physical Medicine & Rehabilitation

## 2017-03-19 ENCOUNTER — Encounter (HOSPITAL_COMMUNITY): Payer: Self-pay | Admitting: *Deleted

## 2017-03-19 ENCOUNTER — Encounter (HOSPITAL_COMMUNITY): Payer: Self-pay | Admitting: Physician Assistant

## 2017-03-19 DIAGNOSIS — N183 Chronic kidney disease, stage 3 unspecified: Secondary | ICD-10-CM

## 2017-03-19 DIAGNOSIS — Z8249 Family history of ischemic heart disease and other diseases of the circulatory system: Secondary | ICD-10-CM

## 2017-03-19 DIAGNOSIS — I129 Hypertensive chronic kidney disease with stage 1 through stage 4 chronic kidney disease, or unspecified chronic kidney disease: Secondary | ICD-10-CM | POA: Diagnosis present

## 2017-03-19 DIAGNOSIS — R4189 Other symptoms and signs involving cognitive functions and awareness: Secondary | ICD-10-CM | POA: Diagnosis present

## 2017-03-19 DIAGNOSIS — I480 Paroxysmal atrial fibrillation: Secondary | ICD-10-CM

## 2017-03-19 DIAGNOSIS — R0989 Other specified symptoms and signs involving the circulatory and respiratory systems: Secondary | ICD-10-CM

## 2017-03-19 DIAGNOSIS — Z716 Tobacco abuse counseling: Secondary | ICD-10-CM

## 2017-03-19 DIAGNOSIS — G4700 Insomnia, unspecified: Secondary | ICD-10-CM | POA: Diagnosis present

## 2017-03-19 DIAGNOSIS — M25562 Pain in left knee: Secondary | ICD-10-CM

## 2017-03-19 DIAGNOSIS — I749 Embolism and thrombosis of unspecified artery: Secondary | ICD-10-CM

## 2017-03-19 DIAGNOSIS — Z90710 Acquired absence of both cervix and uterus: Secondary | ICD-10-CM | POA: Diagnosis not present

## 2017-03-19 DIAGNOSIS — E785 Hyperlipidemia, unspecified: Secondary | ICD-10-CM | POA: Diagnosis present

## 2017-03-19 DIAGNOSIS — Z7902 Long term (current) use of antithrombotics/antiplatelets: Secondary | ICD-10-CM

## 2017-03-19 DIAGNOSIS — I69354 Hemiplegia and hemiparesis following cerebral infarction affecting left non-dominant side: Secondary | ICD-10-CM | POA: Diagnosis not present

## 2017-03-19 DIAGNOSIS — N179 Acute kidney failure, unspecified: Secondary | ICD-10-CM

## 2017-03-19 DIAGNOSIS — R2689 Other abnormalities of gait and mobility: Secondary | ICD-10-CM | POA: Diagnosis present

## 2017-03-19 DIAGNOSIS — I10 Essential (primary) hypertension: Secondary | ICD-10-CM | POA: Diagnosis present

## 2017-03-19 DIAGNOSIS — D62 Acute posthemorrhagic anemia: Secondary | ICD-10-CM | POA: Diagnosis present

## 2017-03-19 DIAGNOSIS — N189 Chronic kidney disease, unspecified: Secondary | ICD-10-CM

## 2017-03-19 DIAGNOSIS — F4323 Adjustment disorder with mixed anxiety and depressed mood: Secondary | ICD-10-CM

## 2017-03-19 DIAGNOSIS — G8194 Hemiplegia, unspecified affecting left nondominant side: Secondary | ICD-10-CM

## 2017-03-19 DIAGNOSIS — Z8659 Personal history of other mental and behavioral disorders: Secondary | ICD-10-CM

## 2017-03-19 DIAGNOSIS — R45851 Suicidal ideations: Secondary | ICD-10-CM

## 2017-03-19 DIAGNOSIS — Z8673 Personal history of transient ischemic attack (TIA), and cerebral infarction without residual deficits: Secondary | ICD-10-CM

## 2017-03-19 DIAGNOSIS — D649 Anemia, unspecified: Secondary | ICD-10-CM

## 2017-03-19 DIAGNOSIS — M1712 Unilateral primary osteoarthritis, left knee: Secondary | ICD-10-CM | POA: Diagnosis present

## 2017-03-19 DIAGNOSIS — Z720 Tobacco use: Secondary | ICD-10-CM

## 2017-03-19 DIAGNOSIS — E876 Hypokalemia: Secondary | ICD-10-CM

## 2017-03-19 DIAGNOSIS — Z79899 Other long term (current) drug therapy: Secondary | ICD-10-CM

## 2017-03-19 DIAGNOSIS — G479 Sleep disorder, unspecified: Secondary | ICD-10-CM

## 2017-03-19 DIAGNOSIS — Z823 Family history of stroke: Secondary | ICD-10-CM

## 2017-03-19 DIAGNOSIS — I638 Other cerebral infarction: Secondary | ICD-10-CM

## 2017-03-19 LAB — BASIC METABOLIC PANEL
ANION GAP: 11 (ref 5–15)
BUN: 19 mg/dL (ref 6–20)
CHLORIDE: 106 mmol/L (ref 101–111)
CO2: 24 mmol/L (ref 22–32)
Calcium: 9.4 mg/dL (ref 8.9–10.3)
Creatinine, Ser: 1.14 mg/dL — ABNORMAL HIGH (ref 0.44–1.00)
GFR, EST AFRICAN AMERICAN: 55 mL/min — AB (ref >60)
GFR, EST NON AFRICAN AMERICAN: 48 mL/min — AB (ref >60)
Glucose, Bld: 97 mg/dL (ref 65–99)
POTASSIUM: 4 mmol/L (ref 3.5–5.1)
SODIUM: 141 mmol/L (ref 135–145)

## 2017-03-19 SURGERY — ECHOCARDIOGRAM, TRANSESOPHAGEAL
Anesthesia: Moderate Sedation

## 2017-03-19 MED ORDER — LOSARTAN POTASSIUM 50 MG PO TABS
100.0000 mg | ORAL_TABLET | Freq: Every day | ORAL | Status: DC
  Administered 2017-03-20 – 2017-04-05 (×17): 100 mg via ORAL
  Filled 2017-03-19 (×17): qty 2

## 2017-03-19 MED ORDER — ACETAMINOPHEN 160 MG/5ML PO SOLN: 650.0000 mg | ORAL | Status: DC | PRN

## 2017-03-19 MED ORDER — SENNOSIDES-DOCUSATE SODIUM 8.6-50 MG PO TABS: 1.0000 | ORAL_TABLET | Freq: Every evening | ORAL | 1 refills | Status: DC | PRN

## 2017-03-19 MED ORDER — LOSARTAN POTASSIUM 100 MG PO TABS: 100.0000 mg | ORAL_TABLET | Freq: Every day | ORAL | 1 refills | Status: DC

## 2017-03-19 MED ORDER — ENSURE ENLIVE PO LIQD
237.0000 mL | Freq: Two times a day (BID) | ORAL | Status: DC
  Administered 2017-03-22 – 2017-04-04 (×15): 237 mL via ORAL

## 2017-03-19 MED ORDER — ATORVASTATIN CALCIUM 10 MG PO TABS
10.0000 mg | ORAL_TABLET | Freq: Every day | ORAL | Status: DC
  Administered 2017-03-20 – 2017-04-04 (×16): 10 mg via ORAL
  Filled 2017-03-19 (×16): qty 1

## 2017-03-19 MED ORDER — ONDANSETRON HCL 4 MG/2ML IJ SOLN: 4.0000 mg | Freq: Four times a day (QID) | INTRAMUSCULAR | Status: DC | PRN

## 2017-03-19 MED ORDER — TEMAZEPAM 7.5 MG PO CAPS
15.0000 mg | ORAL_CAPSULE | Freq: Every day | ORAL | Status: DC
  Administered 2017-03-19 – 2017-03-21 (×3): 15 mg via ORAL
  Filled 2017-03-19 (×3): qty 2

## 2017-03-19 MED ORDER — VERAPAMIL HCL ER 240 MG PO TBCR
240.0000 mg | EXTENDED_RELEASE_TABLET | Freq: Every day | ORAL | Status: DC
  Administered 2017-03-19 – 2017-03-21 (×3): 240 mg via ORAL
  Filled 2017-03-19 (×3): qty 1

## 2017-03-19 MED ORDER — APIXABAN 5 MG PO TABS
5.0000 mg | ORAL_TABLET | Freq: Two times a day (BID) | ORAL | Status: DC
  Administered 2017-03-19: 5 mg via ORAL
  Filled 2017-03-19: qty 1

## 2017-03-19 MED ORDER — ONDANSETRON HCL 4 MG PO TABS: 4.0000 mg | ORAL_TABLET | Freq: Four times a day (QID) | ORAL | Status: DC | PRN

## 2017-03-19 MED ORDER — ACETAMINOPHEN 650 MG RE SUPP: 650.0000 mg | RECTAL | Status: DC | PRN

## 2017-03-19 MED ORDER — CARVEDILOL 12.5 MG PO TABS
12.5000 mg | ORAL_TABLET | Freq: Two times a day (BID) | ORAL | Status: DC
  Administered 2017-03-20 – 2017-04-05 (×33): 12.5 mg via ORAL
  Filled 2017-03-19 (×33): qty 1

## 2017-03-19 MED ORDER — SENNOSIDES-DOCUSATE SODIUM 8.6-50 MG PO TABS
1.0000 | ORAL_TABLET | Freq: Every evening | ORAL | Status: DC | PRN
  Administered 2017-03-24 – 2017-03-25 (×2): 1 via ORAL
  Filled 2017-03-19 (×2): qty 1

## 2017-03-19 MED ORDER — SIMVASTATIN 20 MG PO TABS
20.0000 mg | ORAL_TABLET | Freq: Every day | ORAL | Status: DC
  Filled 2017-03-19: qty 1

## 2017-03-19 MED ORDER — LATANOPROST 0.005 % OP SOLN
1.0000 [drp] | OPHTHALMIC | Status: DC
  Administered 2017-03-20 – 2017-04-05 (×17): 1 [drp] via OPHTHALMIC
  Filled 2017-03-19 (×2): qty 2.5

## 2017-03-19 MED ORDER — SORBITOL 70 % SOLN
30.0000 mL | Freq: Every day | Status: DC | PRN
  Administered 2017-03-24: 30 mL via ORAL
  Filled 2017-03-19: qty 30

## 2017-03-19 MED ORDER — APIXABAN 5 MG PO TABS
5.0000 mg | ORAL_TABLET | Freq: Two times a day (BID) | ORAL | Status: DC
  Administered 2017-03-19 – 2017-04-05 (×34): 5 mg via ORAL
  Filled 2017-03-19 (×34): qty 1

## 2017-03-19 MED ORDER — ACETAMINOPHEN 325 MG PO TABS
650.0000 mg | ORAL_TABLET | ORAL | Status: DC | PRN
  Administered 2017-03-19 – 2017-04-03 (×20): 650 mg via ORAL
  Filled 2017-03-19 (×20): qty 2

## 2017-03-19 MED ORDER — DICLOFENAC SODIUM 1 % TD GEL
2.0000 g | Freq: Four times a day (QID) | TRANSDERMAL | Status: DC
  Administered 2017-03-19 – 2017-03-29 (×35): 2 g via TOPICAL
  Filled 2017-03-19: qty 100

## 2017-03-19 MED ORDER — APIXABAN 5 MG PO TABS: 5.0000 mg | ORAL_TABLET | Freq: Two times a day (BID) | ORAL | 1 refills | Status: DC

## 2017-03-19 NOTE — Consult Note (Signed)
ELECTROPHYSIOLOGY CONSULT NOTE  Patient ID: Christina [name] MRN: [id number], DOB/AGE: 1946-12-14   Admit date: 03/14/2017 Date of Consult: 03/19/2017  Primary Physician: Pcp Not In System Primary Cardiologist: lives in [place] Reason for Consultation: Cryptogenic stroke ; recommendations regarding Implantable Loop Recorder  History of Present Illness Christina [name] was admitted on 03/14/2017 with persistent ambulation and cognitive difficulties s/p CVA she suffered some weeks ago in [place].  She was diagnosed with a stroke then, though c/w difficulties and her daughter brought [place] to the [place] for further care.  They first developed symptoms while cleaning her patio.  Imaging demonstratedLeft MCA, right thalamus, right ACA infarct, embolic pattern - source unclear.  she has undergone workup for stroke including echocardiogram and carotid angio.     Echocardiogram this admission demonstrated  TTE - Left ventricle: The cavity size was normal. Wall thickness was normal. Systolic function was normal. The estimated ejection fraction was in the range of 55% to 60%. Wall motion was normal; there were no regional wall motion abnormalities. Doppler parameters are consistent with abnormal left ventricular relaxation (grade 1 diastolic dysfunction). Doppler parameters are consistent with high ventricular filling pressure. - Aortic valve: Valve area (VTI): 1.88 cm^2. Valve area (Vmax): 1.81 cm^2. Valve area (Vmean): 1.64 cm^2. - Mitral valve: Mildly calcified annulus. Normal thickness leaflets . There was mild regurgitation. - Left atrium: The atrium was severely dilated. - Atrial septum: No defect or patent foramen ovale was identified. - Technically adequate study.    Lab work is reviewed.  Prior to admission, the patient denies chest pain, shortness of breath, dizziness, or syncope.  She does though have hx of palpitations states she saw a doctor in [place] about a year or so ago and was told  she had a tachycardia and started on atenolol, she reports this has lessened the frequency of her palpitations to about once a month.  EP has been asked to evaluate for placement of an implantable loop recorder to monitor for atrial fibrillation.  CHADS-VASc score is greater than or equal to5 (gender-1, age-23, hypertension-1, stroke-2)   Past Medical History:  Diagnosis Date  . Hypertension   . Stroke [facility](HCC)      Surgical History:  Past Surgical History:  Procedure Laterality Date  . ABDOMINAL HYSTERECTOMY    . EYE SURGERY       Prescriptions Prior to Admission  Medication Sig Dispense Refill Last Dose  . aspirin 325 MG tablet Take 325 mg by mouth daily.   03/13/2017 at Unknown time  . carvedilol (COREG) 25 MG tablet Take 25 mg by mouth daily.   03/13/2017 at 0800  . clopidogrel (PLAVIX) 75 MG tablet Take 75 mg by mouth daily.   03/13/2017 at Unknown time  . latanoprost (XALATAN) 0.005 % ophthalmic solution Place 1 drop into both eyes every morning.   03/13/2017 at Unknown time  . losartan (COZAAR) 50 MG tablet Take 50 mg by mouth daily.   03/13/2017 at Unknown time  . simvastatin (ZOCOR) 20 MG tablet Take 20 mg by mouth daily.   03/13/2017 at Unknown time  . temazepam (RESTORIL) 15 MG capsule Take 15 mg by mouth at bedtime.   03/13/2017 at Unknown time  . verapamil (CALAN-SR) 240 MG CR tablet Take 240 mg by mouth at bedtime.   03/13/2017 at Unknown time    Inpatient Medications:  . apixaban  5 mg Oral BID  . carvedilol  12.5 mg Oral BID WC  . latanoprost  1 drop Both  Eyes [id number]  . losartan  100 mg Oral Daily  . simvastatin  20 mg Oral Daily  . temazepam  15 mg Oral QHS  . verapamil  240 mg Oral QHS    Allergies: No Known Allergies  Social History   Social History  . Marital status: Married    Spouse name: N/A  . Number of children: N/A  . Years of education: N/A   Occupational History  . Not on file.   Social History Main Topics  . Smoking status: Never Smoker  .  Smokeless tobacco: Never Used  . Alcohol use No  . Drug use: No  . Sexual activity: Not on file   Other Topics Concern  . Not on file   Social History Narrative  . No narrative on file     Family History  Problem Relation Age of Onset  . Heart disease Mother   . Diabetes Mother   . Heart disease Father   . Heart disease Sister   . Heart disease Sister       Review of Systems: All other systems reviewed and are otherwise negative except as noted above.  Physical Exam: Vitals:   03/18/17 2100 03/19/17 0019 03/19/17 0645 03/19/17 0914  BP: (!) 182/58 (!) 158/53 (!) 154/64 (!) 179/72  Pulse: 81 65 64 72  Resp:  18 18 20   Temp: 98 F (36.7 C) 98 F (36.7 C) 98.6 F (37 C) 98.4 F (36.9 C)  TempSrc: Oral Oral Oral Oral  SpO2: 100% 100% 97% 99%  Weight:      Height:        GEN- The patient is well appearing, alert and oriented to self and situation, not time, knows she is the [place] and a hospital, not city/state.   Head- normocephalic, atraumatic Eyes-  Sclera clear, conjunctiva pink Ears- hearing intact Oropharynx- clear Neck- supple Lungs- CTA b/l, normal work of breathing Heart- RRR, no murmurs, rubs or gallops  GI- soft, NT, ND Extremities- no clubbing, cyanosis, or edema MS- no significant deformity or atrophy Skin- no rash or lesion Psych- euthymic mood, full affect   Labs:   Lab Results  Component Value Date   WBC 6.1 03/18/2017   HGB 10.6 (L) 03/18/2017   HCT 31.7 (L) 03/18/2017   MCV 84.8 03/18/2017   PLT 223 03/18/2017     Recent Labs Lab 03/18/17 1021 03/19/17 0432  NA 141 141  K 4.5 4.0  CL 107 106  CO2 26 24  BUN 15 19  CREATININE 1.04* 1.14*  CALCIUM 9.8 9.4  PROT 6.9  --   BILITOT 0.6  --   ALKPHOS 63  --   ALT 24  --   AST 30  --   GLUCOSE 109* 97   No results found for: CKTOTAL, CKMB, CKMBINDEX, TROPONINI Lab Results  Component Value Date   CHOL 131 03/15/2017   Lab Results  Component Value Date   HDL 34 (L)  03/15/2017   Lab Results  Component Value Date   LDLCALC 62 03/15/2017   Lab Results  Component Value Date   TRIG 175 (H) 03/15/2017   Lab Results  Component Value Date   CHOLHDL 3.9 03/15/2017   No results found for: LDLDIRECT  No results found for: DDIMER   Radiology/Studies:  Ct Angio Head W Or Wo Contrast Result Date: 03/15/2017 CLINICAL DATA:  LEFT-sided weakness. History of cerebral infarction 3 weeks ago. MRI shows multiple vascular territories involved. EXAM: CT ANGIOGRAPHY HEAD AND  NECK TECHNIQUE: Multidetector CT imaging of the head and neck was performed using the standard protocol during bolus administration of intravenous contrast. Multiplanar CT image reconstructions and MIPs were obtained to evaluate the vascular anatomy. Carotid stenosis measurements (when applicable) are obtained utilizing NASCET criteria, using the distal internal carotid diameter as the denominator. CONTRAST:  Isovue 370, 50 mL. COMPARISON:  MR brain 03/14/2017.  CT head 03/14/2017. FINDINGS: CT HEAD Calvarium and skull base: No fracture or destructive lesion. Mastoids and middle ears are grossly clear. Paranasal sinuses: Imaged portions are clear. Orbits: Negative. Brain: The multifocal RIGHT frontal, LEFT occipital, and RIGHT thalamus subacute infarcts are better seen on MR. There is global atrophy with chronic microvascular ischemic change. No hemorrhage is present. Chronic LEFT cerebellar infarct redemonstrated. CTA NECK Aortic arch: Standard branching. Imaged portion shows no evidence of aneurysm or dissection. No significant stenosis of the major arch vessel origins although widespread calcifications present along with some soft plaque involving not only the vessel origins both transverse arch. Right carotid system: Advanced dolichoectasia. Predominantly calcific plaque along the distal common carotid artery, and at the bifurcation but no measurable stenosis. No evidence of dissection, stenosis (50% or  greater) or occlusion. Left carotid system: Advanced dolichoectasia. Predominantly calcific plaque along the distal common carotid artery and at the bifurcation, but no measurable stenosis. No evidence of dissection, stenosis (50% or greater) or occlusion. Vertebral arteries: RIGHT vertebral is dominant/patent from its origin to the basilar confluence. LEFT vertebral is occluded just above its origin, and is not patent in the neck. Nonvascular soft tissues: Cervical spondylosis. No lung lesions. Normal thyroid. CTA HEAD Anterior circulation: Calcific plaque in both cavernous carotid arteries. Possible 50% stenosis on the LEFT. No proximal anterior or middle cerebral artery stenosis is evident. No MCA branch occlusion. Both distal anterior cerebral arteries patent. No aneurysm, or vascular malformation. Posterior circulation: The basilar artery is diffusely small, which likely relates to hypoplasia from BILATERAL fetal PCA origins. The basilar is also mildly irregular, and superimposed atherosclerotic disease is suspected. The RIGHT PCA is widely patent. The LEFT PCA is severely diseased proximally, and does not significantly fill in its P2 or P3 segments. No visible cerebellar branch occlusion. Probable retrograde flow in the LEFT vertebral to supply the PICA. No aneurysm, or vascular malformation. Venous sinuses: As permitted by contrast timing, patent. Anatomic variants: BILATERAL fetal PCA. Delayed phase: Gyriform enhancement of the medial/superior subacute RIGHT frontal infarct reflects its subacute nature. Similar more subtle findings in the LEFT occipital cortex. Review of the MIP images confirms the above findings IMPRESSION: No significant carotid stenosis or proximal anterior circulation disease. 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 LEFT PCA occlusion. Subacute RIGHT frontal infarct shows mild postcontrast enhancement consistent with an insult a few weeks ago. Electronically Signed   By: Elsie Stain M.D.   On: 03/15/2017 13:59    Mr Brain Wo Contrast Result Date: 03/14/2017 CLINICAL DATA:  Stroke sustained 3 weeks ago [place] Unable to walk. Confusion. EXAM: MRI HEAD WITHOUT CONTRAST TECHNIQUE: Multiplanar, multiecho pulse sequences of the brain and surrounding structures were obtained without intravenous contrast. COMPARISON:  CT same day FINDINGS: Brain: Diffusion imaging shows multiple vascular distributions subacute infarctions consistent with embolic disease from the heart or ascending aorta. There is acute infarction in the right thalamus, the left parieto-occipital junction cortical and subcortical brain  and the right para median frontal brain. No evidence hemorrhage or mass effect. The brainstem is unremarkable. There are a few old small vessel cerebellar infarctions. Cerebral hemispheres otherwise show atrophy with chronic small-vessel ischemic changes of the deep and subcortical white matter and old lacunar infarctions in the basal ganglia. No evidence of neoplastic mass lesion. Ventricles are prominent, probably secondary to atrophy. Normal pressure hydrocephalus not excluded but not specifically suggested. Vascular: Major vessels at the base of the brain show flow. 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 There is involvement of the right thalamus, left parieto-occipital junction region and right frontal para median brain. Chronic small-vessel ischemic changes elsewhere as described. Atrophy. Ventricular prominence probably secondary to ex vacuo enlargement. Normal pressure hydrocephalus not excluded, but not specifically  favored. Electronically Signed   By: Paulina Fusi M.D.   On: 03/14/2017 15:12     12-lead ECG SR All prior EKG's in EPIC reviewed with no documented atrial fibrillation  Telemetry is reviewed by myself and strips are also reviewed with Dr. Graciela Husbands, is SR, 2 very brief episiodes of PAFib are noted  Assessment and Plan:  1. stroke PAF is noted on telemetry I have discussed with neuro NP, recommend full a/c when able, deferred to their service  Please call with questions.   Sheilah Pigeon, PA-C 03/19/2017  The patient has a cryptogenic stroke. She has a history of antecedent palpitations  she now has atrial fibrillation on her monitor. Anticoagulation is indicated. This has been reviewed with neurology  The patient was seen and examined. As noted above.  Hypertension  CVA  Atrial fibrillation    The patient needs to be anticoagulated. Antiplatelet therapy can be discontinued. Blood pressure  therapy will be deferred to neurology post stroke   Will arrange post hospital visit

## 2017-03-19 NOTE — H&P (Signed)
Physical Medicine and Rehabilitation Admission H&P    No chief complaint on file. : HPI: Christina [name] is a 71 y.o. right handed female with history of hypertension, prior tobacco abuse and recently diagnosed with CVA about 3 weeks ago while in [place] with left-sided weakness and placed on aspirin and Plavix for CVA prophylaxis. She was later discharged to home but by report of family unable to ambulate. She was subsequently brought to the [place] by her daughter for further management and presented to [facility] 03/14/2017. Per chart review patient is from [place] had been living in [place] for the past 30 years alone and was independent. She has family in [place] who can assist as needed. A CT of the head showed cerebral atrophy, ventriculomegaly and periventricular white matter disease. Suspect lacunar type left basal ganglia infarct. MRI of the brain showed multiple subacute infarcts in multiple vascular territories suggesting embolic disease from the heart or ascending aorta. There was involvement of the right thalamus, left parieto-occipital junction region and right frontal paramedian brain. CT angiogram head and neck showed no significant carotid stenosis or proximal anterior circulation disease. There was noted occluded left vertebral throughout much of its course likely chronic given the old left cerebellar infarct. Left PCA occlusion. Venous Dopplers lower extremities showed superficial thrombosis noted in the left lesser saphenous vein and in the varicosities of the Left calf. Echocardiogram with ejection fraction of 38% grade 1 diastolic dysfunction.  Patient remains on aspirin and Plavix for CVA prophylaxisHowever telemetry showed bouts of atrial fibrillation and was changed to Eliquis. No plan for TEE after telemetry monitored indicate atrial fibrillation.Marland Kitchen Physical and occupational therapy evaluations completed with recommendations of physical medicine rehabilitation  consult.Patient was admitted for a comprehensive rehabilitation program  Review of Systems  Constitutional: Negative for chills and fever.  HENT: Negative for hearing loss.   Eyes: Negative for blurred vision and double vision.  Respiratory: Negative for cough and shortness of breath.   Cardiovascular: Positive for palpitations and leg swelling. Negative for chest pain.  Gastrointestinal: Positive for constipation. Negative for nausea and vomiting.  Genitourinary: Negative for dysuria, flank pain and hematuria.  Musculoskeletal: Positive for myalgias.  Skin: Negative for rash.  Neurological: Positive for sensory change and weakness. Negative for seizures.       Intermittent headaches  Psychiatric/Behavioral: The patient has insomnia.   All other systems reviewed and are negative.  Past Medical History:  Diagnosis Date  . Hypertension   . Stroke [facility])    Past Surgical History:  Procedure Laterality Date  . ABDOMINAL HYSTERECTOMY    . EYE SURGERY     Family History  Problem Relation Age of Onset  . Heart disease Mother   . Diabetes Mother   . Heart disease Father   . Heart disease Sister   . Heart disease Sister    Social History:  reports that she has never smoked. She has never used smokeless tobacco. She reports that she does not drink alcohol or use drugs. Allergies: No Known Allergies Medications Prior to Admission  Medication Sig Dispense Refill  . apixaban (ELIQUIS) 5 MG TABS tablet Take 1 tablet (5 mg total) by mouth 2 (two) times daily. 60 tablet 1  . carvedilol (COREG) 25 MG tablet Take 25 mg by mouth daily.    Marland Kitchen latanoprost (XALATAN) 0.005 % ophthalmic solution Place 1 drop into both eyes every morning.    Marland Kitchen losartan (COZAAR) 100 MG tablet Take 1 tablet (  100 mg total) by mouth daily. 30 tablet 1  . senna-docusate (SENOKOT-S) 8.6-50 MG tablet Take 1 tablet by mouth at bedtime as needed for moderate constipation. 30 tablet 1  . simvastatin (ZOCOR) 20 MG tablet Take  20 mg by mouth daily.    . temazepam (RESTORIL) 15 MG capsule Take 15 mg by mouth at bedtime.    . verapamil (CALAN-SR) 240 MG CR tablet Take 240 mg by mouth at bedtime.      Home:     Functional History:    Functional Status:  Mobility:          ADL:    Cognition:      Physical Exam: Blood pressure (!) 173/64, pulse 72, temperature 99.1 F (37.3 C), temperature source Oral, resp. rate 18, SpO2 98 %. Physical Exam  Vitals reviewed. Constitutional: She appears well-developed.  HENT:  Head: Normocephalic.  Eyes: EOM are normal. Left eye exhibits no discharge.  Neck: Normal range of motion. Neck supple. No thyromegaly present.  Cardiovascular:  Cardiac rate controlled  Respiratory: Effort normal and breath sounds normal. No respiratory distress. She has no wheezes.  GI: Soft. Bowel sounds are normal. She exhibits no distension.   Skin. Warm and dry Neurological: She is alert.  Mood is a bit flat but appropriate. She provides her main age and date of birth. Follows simple commands.. A little more difficulty intiating with left arm/leg while working with therapy 4/5 L delt bi tri grip 3- HF, KE, ADF 5/5 on RIght side  Results for orders placed or performed during the hospital encounter of 03/14/17 (from the past 48 hour(s))  CBC     Status: Abnormal   Collection Time: 03/18/17  3:46 AM  Result Value Ref Range   WBC 6.1 4.0 - 10.5 K/uL   RBC 3.74 (L) 3.87 - 5.11 MIL/uL   Hemoglobin 10.6 (L) 12.0 - 15.0 g/dL   HCT 31.7 (L) 36.0 - 46.0 %   MCV 84.8 78.0 - 100.0 fL   MCH 28.3 26.0 - 34.0 pg   MCHC 33.4 30.0 - 36.0 g/dL   RDW 13.6 11.5 - 15.5 %   Platelets 223 150 - 400 K/uL  Comprehensive metabolic panel     Status: Abnormal   Collection Time: 03/18/17 10:21 AM  Result Value Ref Range   Sodium 141 135 - 145 mmol/L   Potassium 4.5 3.5 - 5.1 mmol/L   Chloride 107 101 - 111 mmol/L   CO2 26 22 - 32 mmol/L   Glucose, Bld 109 (H) 65 - 99 mg/dL   BUN 15 6 - 20 mg/dL     Creatinine, Ser 1.04 (H) 0.44 - 1.00 mg/dL   Calcium 9.8 8.9 - 10.3 mg/dL   Total Protein 6.9 6.5 - 8.1 g/dL   Albumin 3.2 (L) 3.5 - 5.0 g/dL   AST 30 15 - 41 U/L   ALT 24 14 - 54 U/L   Alkaline Phosphatase 63 38 - 126 U/L   Total Bilirubin 0.6 0.3 - 1.2 mg/dL   GFR calc non Af Amer 53 (L) >60 mL/min   GFR calc Af Amer >60 >60 mL/min    Comment: (NOTE) The eGFR has been calculated using the CKD EPI equation. This calculation has not been validated in all clinical situations. eGFR's persistently <60 mL/min signify possible Chronic Kidney Disease.    Anion gap 8 5 - 15  Basic metabolic panel     Status: Abnormal   Collection Time: 03/19/17  4:32 AM  Result Value Ref Range   Sodium 141 135 - 145 mmol/L   Potassium 4.0 3.5 - 5.1 mmol/L   Chloride 106 101 - 111 mmol/L   CO2 24 22 - 32 mmol/L   Glucose, Bld 97 65 - 99 mg/dL   BUN 19 6 - 20 mg/dL   Creatinine, Ser 1.14 (H) 0.44 - 1.00 mg/dL   Calcium 9.4 8.9 - 10.3 mg/dL   GFR calc non Af Amer 48 (L) >60 mL/min   GFR calc Af Amer 55 (L) >60 mL/min    Comment: (NOTE) The eGFR has been calculated using the CKD EPI equation. This calculation has not been validated in all clinical situations. eGFR's persistently <60 mL/min signify possible Chronic Kidney Disease.    Anion gap 11 5 - 15   No results found.     Medical Problem List and Plan: 1.  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  DVT Prophylaxis/Anticoagulation: Eliquis 3. Pain Management: Tylenol as needed 4. Mood/insomnia. Restoril 15 mg daily at bedtime 5. Neuropsych: This patient is capable of making decisions on her own behalf. 6. Skin/Wound Care: Routine skin checks 7. Fluids/Electrolytes/Nutrition: Routine I&O with follow-up chemistries 8. Hypertension. Coreg 12.5 mg twice a day, verapamil 240 mg daily at bedtime, Cozaar 100 mg daily. Monitor with increased mobility 9. Atrial fibrillation. Plan is currently to continue on  Eliquis 10. Tobacco abuse. Counseling 11. Hyperlipidemia. Zocor   Post Admission Physician Evaluation: 1. Functional deficits secondary  to right hemiparesis. 2. Patient is admitted to receive collaborative, interdisciplinary care between the physiatrist, rehab nursing staff, and therapy team. 3. Patient's level of medical complexity and substantial therapy needs in context of that medical necessity cannot be provided at a lesser intensity of care such as a SNF. 4. Patient has experienced substantial functional loss from his/her baseline which was documented above under the "Functional History" and "Functional Status" headings.  Judging by the patient's diagnosis, physical exam, and functional history, the patient has potential for functional progress which will result in measurable gains while on inpatient rehab.  These gains will be of substantial and practical use upon discharge  in facilitating mobility and self-care at the household level. 5. Physiatrist will provide 24 hour management of medical needs as well as oversight of the therapy plan/treatment and provide guidance as appropriate regarding the interaction of the two. 6. The Preadmission Screening has been reviewed and patient status is unchanged unless otherwise stated above. 7. 24 hour rehab nursing will assist with bladder management, bowel management, safety, skin/wound care, disease management, medication administration, pain management and patient education  and help integrate therapy concepts, techniques,education, etc. 8. PT will assess and treat for/with: pre gait, gait training, endurance , safety, equipment, neuromuscular re education.   Goals are: Sup 9. OT will assess and treat for/with: .ADLs, Cognitive perceptual skills, Neuromuscular re education, safety, endurance, equipment .   Goals are: Sup. Therapy may proceed with showering this patient. 10. SLP will assess and treat for/with: assess language , cognition and  swallowing.  Goals are: Sup med management, safe po intake. 11. Case Management and Social Worker will assess and treat for psychological issues and discharge planning. 12. Team conference will be held weekly to assess progress toward goals and to determine barriers to discharge. 13. Patient will receive at least 3 hours of therapy per day at least 5 days per week. 14. ELOS: 14-17d       15. Prognosis:  good  DAN ANGIULLI PA-C Charlett Blake, MD 03/19/2017

## 2017-03-19 NOTE — Progress Notes (Signed)
Occupational Therapy Treatment Patient Details Name: Christina [name] MRN: [id number] DOB: [birth year] Today's Date: 03/19/2017    History of present illness Pt is a 71 y/o female who presents with L-sided weakness, diplopia, and HA. 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 This was [place]. Daughter brought the pt to the [facility] for continued care after she was not improving. MRI of brain revealed no acute changes.    OT comments  Pt progressing well toward OT goals. Pt able to complete sit<>stand to stand at sink for ADL with min assist +2 and mod +2 assist for toilet transfers. Facilitated improved L UE functional reach during grooming tasks utilizing PNF patterns. She demonstrated improved attention to task in non-distracting environment and was able to follow one-step commands with improved consistency. D/C plan remains appropriate. OT will continue to follow acutely.   Follow Up Recommendations  CIR;Supervision/Assistance - 24 hour    Equipment Recommendations  Other (comment) (defer to next venue)    Recommendations for Other Services Rehab consult    Precautions / Restrictions Precautions Precautions: Fall Precaution Comments: Inconsistent impairments throughout session. Restrictions Weight Bearing Restrictions: No       Mobility Bed Mobility Overal bed mobility: Needs Assistance Bed Mobility: Sidelying to Sit;Rolling;Sit to Supine Rolling: Min assist Sidelying to sit: Mod assist       General bed mobility comments: Improved ability to follow commands during bed mobility.   Transfers Overall transfer level: Needs assistance Equipment used: Rolling walker (2 wheeled) Transfers: Sit to/from Stand Sit to Stand: +2 physical assistance;Min assist Stand pivot transfers: Mod assist;+2 physical assistance            Balance Overall balance assessment: Needs assistance Sitting-balance support: Bilateral upper extremity  supported;Feet supported Sitting balance-Leahy Scale: Fair Sitting balance - Comments: L lateral lean and poor postural control. Postural control: Left lateral lean Standing balance support: Bilateral upper extremity supported Standing balance-Leahy Scale: Poor Standing balance comment: Min hands on facilitated with mirror biofeedback required for upright positioning.                           ADL either performed or assessed with clinical judgement   ADL Overall ADL's : Needs assistance/impaired     Grooming: Minimal assistance;Standing                   Toilet Transfer: Moderate assistance;+2 for safety/equipment;+2 for physical assistance;Stand-pivot           Functional mobility during ADLs: +2 for physical assistance;Moderate assistance General ADL Comments: Facilitated improved L UE weight bearing and functional reach during standing and seated ADL tasks at sink.     Vision   Additional Comments: No complaints of blurry vision.   Perception     Praxis      Cognition Arousal/Alertness: Awake/alert Behavior During Therapy: Flat affect;WFL for tasks assessed/performed Overall Cognitive Status: Impaired/Different from baseline Area of Impairment: Following commands;Safety/judgement;Attention;Problem solving                   Current Attention Level: Selective   Following Commands: Follows one step commands with increased time;Follows one step commands inconsistently Safety/Judgement: Decreased awareness of safety;Decreased awareness of deficits   Problem Solving: Slow processing;Decreased initiation;Difficulty sequencing;Requires verbal cues;Requires tactile cues General Comments: Multimodal cues required for follow through of tasks. Easily distracted by external stimuli.        Exercises  Shoulder Instructions       General Comments      Pertinent Vitals/ Pain       Pain Assessment: No/denies pain  Home Living                                           Prior Functioning/Environment              Frequency  Min 3X/week        Progress Toward Goals  OT Goals(current goals can now be found in the care plan section)  Progress towards OT goals: Progressing toward goals  Acute Rehab OT Goals Patient Stated Goal: Get better and more independent OT Goal Formulation: With patient/family Time For Goal Achievement: 03/29/17 Potential to Achieve Goals: Good ADL Goals Pt Will Perform Upper Body Bathing: with supervision;sitting Pt Will Perform Lower Body Bathing: with supervision;sitting/lateral leans Pt Will Transfer to Toilet: with min guard assist;stand pivot transfer;bedside commode Pt Will Perform Toileting - Clothing Manipulation and hygiene: with modified independence;sit to/from stand Additional ADL Goal #1: Pt will perform bed mobility at supervision level prior to ADL  Plan Discharge plan remains appropriate    Co-evaluation                 End of Session Equipment Utilized During Treatment: Gait belt  OT Visit Diagnosis: Unsteadiness on feet (R26.81);Other abnormalities of gait and mobility (R26.89);Hemiplegia and hemiparesis;Other symptoms and signs involving the nervous system (R29.898);Muscle weakness (generalized) (M62.81) Hemiplegia - Right/Left: Left Hemiplegia - dominant/non-dominant: Non-Dominant Hemiplegia - caused by: Cerebral infarction   Activity Tolerance Patient tolerated treatment well   Patient Left in bed;with bed alarm set;with family/visitor present   Nurse Communication Mobility status        Time: 1001-1029 OT Time Calculation (min): 28 min  Charges: OT General Charges $OT Visit: 1 Procedure OT Treatments $Self Care/Home Management : 23-37 mins  Doristine Section, MS OTR/L  Pager: [phone]    Christina [name] 03/19/2017, 1:29 PM

## 2017-03-19 NOTE — Discharge Summary (Signed)
Physician Discharge Summary  Christina [name] MRN: [id number] DOB/AGE: 07/24/1946 71 y.o.  PCP: Pcp Not In System   Admit date: 03/14/2017 Discharge date: 03/19/2017  Discharge Diagnoses:    Active Problems:   TIA (transient ischemic attack)   History of CVA (cerebrovascular accident)   HTN (hypertension)   HLD (hyperlipidemia)   Tobacco abuse, in remission   Acute cystitis without hematuria   Stroke [facility])   Diplopia    Follow-up recommendations Follow-up with PCP in 3-5 days , including all  additional recommended appointments as below Follow-up CBC, CMP in 3-5 days Follow-up with neurology in 4-6 weeks      Current Discharge Medication List    START taking these medications   Details  apixaban (ELIQUIS) 5 MG TABS tablet Take 1 tablet (5 mg total) by mouth 2 (two) times daily. Qty: 60 tablet, Refills: 1    senna-docusate (SENOKOT-S) 8.6-50 MG tablet Take 1 tablet by mouth at bedtime as needed for moderate constipation. Qty: 30 tablet, Refills: 1      CONTINUE these medications which have CHANGED   Details  losartan (COZAAR) 100 MG tablet Take 1 tablet (100 mg total) by mouth daily. Qty: 30 tablet, Refills: 1      CONTINUE these medications which have NOT CHANGED   Details  carvedilol (COREG) 25 MG tablet Take 25 mg by mouth daily.    latanoprost (XALATAN) 0.005 % ophthalmic solution Place 1 drop into both eyes every morning.    simvastatin (ZOCOR) 20 MG tablet Take 20 mg by mouth daily.    temazepam (RESTORIL) 15 MG capsule Take 15 mg by mouth at bedtime.    verapamil (CALAN-SR) 240 MG CR tablet Take 240 mg by mouth at bedtime.      STOP taking these medications     aspirin 325 MG tablet      clopidogrel (PLAVIX) 75 MG tablet          Discharge Condition: Stable   Discharge Instructions Get Medicines reviewed and adjusted: Please take all your medications with you for your next visit with your Primary MD  Please request your Primary MD to go  over all hospital tests and procedure/radiological results at the follow up, please ask your Primary MD to get all Hospital records sent to his/her office.  If you experience worsening of your admission symptoms, develop shortness of breath, life threatening emergency, suicidal or homicidal thoughts you must seek medical attention immediately by calling 911 or calling your MD immediately if symptoms less severe.  You must read complete instructions/literature along with all the possible adverse reactions/side effects for all the Medicines you take and that have been prescribed to you. Take any new Medicines after you have completely understood and accpet all the possible adverse reactions/side effects.   Do not drive when taking Pain medications.   Do not take more than prescribed Pain, Sleep and Anxiety Medications  Special Instructions: If you have smoked or chewed Tobacco in the last 2 yrs please stop smoking, stop any regular Alcohol and or any Recreational drug use.  Wear Seat belts while driving.  Please note  You were cared for by a hospitalist during your hospital stay. Once you are discharged, your primary care physician will handle any further medical issues. Please note that NO REFILLS for any discharge medications will be authorized once you are discharged, as it is imperative that you return to your primary care physician (or establish a relationship with a primary care physician if you  do not have one) for your aftercare needs so that they can reassess your need for medications and monitor your lab values.  Discharge Instructions    Diet - low sodium heart healthy    Complete by:  As directed    Increase activity slowly    Complete by:  As directed        No Known Allergies    Disposition: CIR   Consults:  Inpatient rehabilitation Neurology Electrophysiology    Significant Diagnostic Studies:  Ct Angio Head W Or Wo Contrast  Result Date:  03/15/2017 CLINICAL DATA:  LEFT-sided weakness. History of cerebral infarction 3 weeks ago. MRI shows multiple vascular territories involved. EXAM: CT ANGIOGRAPHY HEAD AND NECK TECHNIQUE: Multidetector CT imaging of the head and neck was performed using the standard protocol during bolus administration of intravenous contrast. Multiplanar CT image reconstructions and MIPs were obtained to evaluate the vascular anatomy. Carotid stenosis measurements (when applicable) are obtained utilizing NASCET criteria, using the distal internal carotid diameter as the denominator. CONTRAST:  Isovue 370, 50 mL. COMPARISON:  MR brain 03/14/2017.  CT head 03/14/2017. FINDINGS: CT HEAD Calvarium and skull base: No fracture or destructive lesion. Mastoids and middle ears are grossly clear. Paranasal sinuses: Imaged portions are clear. Orbits: Negative. Brain: The multifocal RIGHT frontal, LEFT occipital, and RIGHT thalamus subacute infarcts are better seen on MR. There is global atrophy with chronic microvascular ischemic change. No hemorrhage is present. Chronic LEFT cerebellar infarct redemonstrated. CTA NECK Aortic arch: Standard branching. Imaged portion shows no evidence of aneurysm or dissection. No significant stenosis of the major arch vessel origins although widespread calcifications present along with some soft plaque involving not only the vessel origins both transverse arch. Right carotid system: Advanced dolichoectasia. Predominantly calcific plaque along the distal common carotid artery, and at the bifurcation but no measurable stenosis. No evidence of dissection, stenosis (50% or greater) or occlusion. Left carotid system: Advanced dolichoectasia. Predominantly calcific plaque along the distal common carotid artery and at the bifurcation, but no measurable stenosis. No evidence of dissection, stenosis (50% or greater) or occlusion. Vertebral arteries: RIGHT vertebral is dominant/patent from its origin to the basilar  confluence. LEFT vertebral is occluded just above its origin, and is not patent in the neck. Nonvascular soft tissues: Cervical spondylosis. No lung lesions. Normal thyroid. CTA HEAD Anterior circulation: Calcific plaque in both cavernous carotid arteries. Possible 50% stenosis on the LEFT. No proximal anterior or middle cerebral artery stenosis is evident. No MCA branch occlusion. Both distal anterior cerebral arteries patent. No aneurysm, or vascular malformation. Posterior circulation: The basilar artery is diffusely small, which likely relates to hypoplasia from BILATERAL fetal PCA origins. The basilar is also mildly irregular, and superimposed atherosclerotic disease is suspected. The RIGHT PCA is widely patent. The LEFT PCA is severely diseased proximally, and does not significantly fill in its P2 or P3 segments. No visible cerebellar branch occlusion. Probable retrograde flow in the LEFT vertebral to supply the PICA. No aneurysm, or vascular malformation. Venous sinuses: As permitted by contrast timing, patent. Anatomic variants: BILATERAL fetal PCA. Delayed phase: Gyriform enhancement of the medial/superior subacute RIGHT frontal infarct reflects its subacute nature. Similar more subtle findings in the LEFT occipital cortex. Review of the MIP images confirms the above findings IMPRESSION: No significant carotid stenosis or proximal anterior circulation disease. 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  LEFT PCA occlusion. Subacute RIGHT frontal infarct shows mild postcontrast enhancement consistent with an insult a few weeks ago. Electronically Signed   By: Staci Righter M.D.   On: 03/15/2017 13:59   Ct Head Wo Contrast  Result Date: 03/14/2017 CLINICAL DATA:  CVA 3 weeks ago.  Persistent weakness and confusion. EXAM:  CT HEAD WITHOUT CONTRAST TECHNIQUE: Contiguous axial images were obtained from the base of the skull through the vertex without intravenous contrast. COMPARISON:  None. FINDINGS: Brain: Age related cerebral atrophy, ventriculomegaly and periventricular white matter disease. Probable small lacunar type external capsule lacunar type infarcts on the left. No extra-axial fluid collections are identified. No CT findings for acute hemispheric infarction or intracranial hemorrhage. No mass lesions. The brainstem and cerebellum are normal. Vascular: No hyperdense vessels or definite aneurysm. Moderate vascular calcifications. Skull: No skull fracture or bone lesion. Sinuses/Orbits: The paranasal sinuses and mastoid air cells are clear. The globes are intact. Other: No scalp lesions. IMPRESSION: Cerebral atrophy, ventriculomegaly and periventricular white matter disease. Suspect lacunar type left basal ganglia infarcts. No hemispheric infarction or intracranial hemorrhage. Moderate vascular calcifications. Electronically Signed   By: Marijo Sanes M.D.   On: 03/14/2017 13:35   Ct Angio Neck W Or Wo Contrast  Result Date: 03/15/2017 CLINICAL DATA:  LEFT-sided weakness. History of cerebral infarction 3 weeks ago. MRI shows multiple vascular territories involved. EXAM: CT ANGIOGRAPHY HEAD AND NECK TECHNIQUE: Multidetector CT imaging of the head and neck was performed using the standard protocol during bolus administration of intravenous contrast. Multiplanar CT image reconstructions and MIPs were obtained to evaluate the vascular anatomy. Carotid stenosis measurements (when applicable) are obtained utilizing NASCET criteria, using the distal internal carotid diameter as the denominator. CONTRAST:  Isovue 370, 50 mL. COMPARISON:  MR brain 03/14/2017.  CT head 03/14/2017. FINDINGS: CT HEAD Calvarium and skull base: No fracture or destructive lesion. Mastoids and middle ears are grossly clear. Paranasal sinuses: Imaged  portions are clear. Orbits: Negative. Brain: The multifocal RIGHT frontal, LEFT occipital, and RIGHT thalamus subacute infarcts are better seen on MR. There is global atrophy with chronic microvascular ischemic change. No hemorrhage is present. Chronic LEFT cerebellar infarct redemonstrated. CTA NECK Aortic arch: Standard branching. Imaged portion shows no evidence of aneurysm or dissection. No significant stenosis of the major arch vessel origins although widespread calcifications present along with some soft plaque involving not only the vessel origins both transverse arch. Right carotid system: Advanced dolichoectasia. Predominantly calcific plaque along the distal common carotid artery, and at the bifurcation but no measurable stenosis. No evidence of dissection, stenosis (50% or greater) or occlusion. Left carotid system: Advanced dolichoectasia. Predominantly calcific plaque along the distal common carotid artery and at the bifurcation, but no measurable stenosis. No evidence of dissection, stenosis (50% or greater) or occlusion. Vertebral arteries: RIGHT vertebral is dominant/patent from its origin to the basilar confluence. LEFT vertebral is occluded just above its origin, and is not patent in the neck. Nonvascular soft tissues: Cervical spondylosis. No lung lesions. Normal thyroid. CTA HEAD Anterior circulation: Calcific plaque in both cavernous carotid arteries. Possible 50% stenosis on the LEFT. No proximal anterior or middle cerebral artery stenosis is evident. No MCA branch occlusion. Both distal anterior cerebral arteries patent. No aneurysm, or vascular malformation. Posterior circulation: The basilar artery is diffusely small, which likely relates to hypoplasia from BILATERAL fetal PCA origins. The basilar is also mildly irregular, and superimposed atherosclerotic disease is suspected. The RIGHT PCA is widely patent. The LEFT PCA is severely  diseased proximally, and does not significantly fill in its  P2 or P3 segments. No visible cerebellar branch occlusion. Probable retrograde flow in the LEFT vertebral to supply the PICA. No aneurysm, or vascular malformation. Venous sinuses: As permitted by contrast timing, patent. Anatomic variants: BILATERAL fetal PCA. Delayed phase: Gyriform enhancement of the medial/superior subacute RIGHT frontal infarct reflects its subacute nature. Similar more subtle findings in the LEFT occipital cortex. Review of the MIP images confirms the above findings IMPRESSION: No significant carotid stenosis or proximal anterior circulation disease. 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 LEFT PCA occlusion. Subacute RIGHT frontal infarct shows mild postcontrast enhancement consistent with an insult a few weeks ago. Electronically Signed   By: Staci Righter M.D.   On: 03/15/2017 13:59   Mr Brain Wo Contrast  Result Date: 03/14/2017 CLINICAL DATA:  Stroke sustained 3 weeks ago in [place]. Unable to walk. Confusion. EXAM: MRI HEAD WITHOUT CONTRAST TECHNIQUE: Multiplanar, multiecho pulse sequences of the brain and surrounding structures were obtained without intravenous contrast. COMPARISON:  CT same day FINDINGS: Brain: Diffusion imaging shows multiple vascular distributions subacute infarctions consistent with embolic disease from the heart or ascending aorta. There is acute infarction in the right thalamus, the left parieto-occipital junction cortical and subcortical brain and the right para median frontal brain. No evidence hemorrhage or mass effect. The brainstem is unremarkable. There are a few old small vessel cerebellar infarctions. Cerebral hemispheres otherwise show atrophy with chronic small-vessel ischemic changes of the deep and subcortical white matter and old lacunar infarctions in  the basal ganglia. No evidence of neoplastic mass lesion. Ventricles are prominent, probably secondary to atrophy. Normal pressure hydrocephalus not excluded but not specifically suggested. Vascular: Major vessels at the base of the brain show flow. 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 There is involvement of the right thalamus, left parieto-occipital junction region and right frontal para median brain. Chronic small-vessel ischemic changes elsewhere as described. Atrophy. Ventricular prominence probably secondary to ex vacuo enlargement. Normal pressure hydrocephalus not excluded, but not specifically favored. Electronically Signed   By: Nelson Chimes M.D.   On: 03/14/2017 15:12   Dg Knee Complete 4 Views Left  Result Date: 03/16/2017 CLINICAL DATA:  Patient with persistent left knee pain for 6 months. EXAM: LEFT KNEE - COMPLETE 4+ VIEW COMPARISON:  None. FINDINGS: Normal anatomic alignment. No evidence for acute fracture dislocation. Medial compartment joint space narrowing and osteophytosis. Vascular calcifications. IMPRESSION: No acute osseous abnormality. Degenerative changes, most pronounced within the medial compartment. Electronically Signed   By: Lovey Newcomer M.D.   On: 03/16/2017 13:17    echocardiogram    LV EF: 55% -   60%  ------------------------------------------------------------------- Indications:      CVA 436.  ------------------------------------------------------------------- History:   PMH:   Stroke.  Risk factors:  Hypertension.  ------------------------------------------------------------------- Study Conclusions  - Left ventricle: The cavity size was normal. Wall thickness was   normal. Systolic function was normal. The estimated ejection   fraction was in the range of 55% to 60%. Wall  motion was normal;   there were no regional wall motion abnormalities. Doppler   parameters are consistent with abnormal left ventricular   relaxation (grade 1 diastolic dysfunction). Doppler parameters   are  consistent with high ventricular filling pressure. - Aortic valve: Valve area (VTI): 1.88 cm^2. Valve area (Vmax):   1.81 cm^2. Valve area (Vmean): 1.64 cm^2. - Mitral valve: Mildly calcified annulus. Normal thickness leaflets   . There was mild regurgitation. - Left atrium: The atrium was severely dilated. - Atrial septum: No defect or patent foramen ovale was identified. - Technically adequate study.   Filed Weights   03/14/17 1039 03/14/17 1920  Weight: 68 kg (150 lb) 63 kg (138 lb 14.2 oz)     Microbiology: Recent Results (from the past 240 hour(s))  Urine culture     Status: Abnormal   Collection Time: 03/14/17  1:06 PM  Result Value Ref Range Status   Specimen Description URINE, CATHETERIZED  Final   Special Requests NONE  Final   Culture (A)  Final    40,000 COLONIES/mL ESCHERICHIA COLI 20,000 COLONIES/mL PROTEUS MIRABILIS    Report Status 03/17/2017 FINAL  Final   Organism ID, Bacteria ESCHERICHIA COLI (A)  Final   Organism ID, Bacteria PROTEUS MIRABILIS (A)  Final      Susceptibility   Escherichia coli - MIC*    AMPICILLIN <=2 SENSITIVE Sensitive     CEFAZOLIN <=4 SENSITIVE Sensitive     CEFTRIAXONE <=1 SENSITIVE Sensitive     CIPROFLOXACIN <=0.25 SENSITIVE Sensitive     GENTAMICIN <=1 SENSITIVE Sensitive     IMIPENEM <=0.25 SENSITIVE Sensitive     NITROFURANTOIN <=16 SENSITIVE Sensitive     TRIMETH/SULFA >=320 RESISTANT Resistant     AMPICILLIN/SULBACTAM <=2 SENSITIVE Sensitive     PIP/TAZO <=4 SENSITIVE Sensitive     Extended ESBL NEGATIVE Sensitive     * 40,000 COLONIES/mL ESCHERICHIA COLI   Proteus mirabilis - MIC*    AMPICILLIN <=2 SENSITIVE Sensitive     CEFAZOLIN <=4 SENSITIVE Sensitive     CEFTRIAXONE <=1 SENSITIVE Sensitive     CIPROFLOXACIN  <=0.25 SENSITIVE Sensitive     GENTAMICIN <=1 SENSITIVE Sensitive     IMIPENEM 2 SENSITIVE Sensitive     NITROFURANTOIN 128 RESISTANT Resistant     TRIMETH/SULFA <=20 SENSITIVE Sensitive     AMPICILLIN/SULBACTAM <=2 SENSITIVE Sensitive     PIP/TAZO <=4 SENSITIVE Sensitive     * 20,000 COLONIES/mL PROTEUS MIRABILIS       Blood Culture    Component Value Date/Time   SDES URINE, CATHETERIZED 03/14/2017 1306   SPECREQUEST NONE 03/14/2017 1306   CULT (A) 03/14/2017 1306    40,000 COLONIES/mL ESCHERICHIA COLI 20,000 COLONIES/mL PROTEUS MIRABILIS    REPTSTATUS 03/17/2017 FINAL 03/14/2017 1306      Labs: Results for orders placed or performed during the hospital encounter of 03/14/17 (from the past 48 hour(s))  CBC     Status: Abnormal   Collection Time: 03/18/17  3:46 AM  Result Value Ref Range   WBC 6.1 4.0 - 10.5 K/uL   RBC 3.74 (L) 3.87 - 5.11 MIL/uL   Hemoglobin 10.6 (L) 12.0 - 15.0 g/dL   HCT 31.7 (L) 36.0 - 46.0 %   MCV 84.8 78.0 - 100.0 fL   MCH 28.3 26.0 - 34.0 pg   MCHC 33.4 30.0 - 36.0 g/dL   RDW 13.6 11.5 - 15.5 %   Platelets 223 150 - 400 K/uL  Comprehensive metabolic panel     Status: Abnormal   Collection Time: 03/18/17 10:21 AM  Result Value Ref Range   Sodium 141 135 - 145 mmol/L   Potassium 4.5 3.5 - 5.1 mmol/L   Chloride 107 101 -  111 mmol/L   CO2 26 22 - 32 mmol/L   Glucose, Bld 109 (H) 65 - 99 mg/dL   BUN 15 6 - 20 mg/dL   Creatinine, Ser 1.04 (H) 0.44 - 1.00 mg/dL   Calcium 9.8 8.9 - 10.3 mg/dL   Total Protein 6.9 6.5 - 8.1 g/dL   Albumin 3.2 (L) 3.5 - 5.0 g/dL   AST 30 15 - 41 U/L   ALT 24 14 - 54 U/L   Alkaline Phosphatase 63 38 - 126 U/L   Total Bilirubin 0.6 0.3 - 1.2 mg/dL   GFR calc non Af Amer 53 (L) >60 mL/min   GFR calc Af Amer >60 >60 mL/min    Comment: (NOTE) The eGFR has been calculated using the CKD EPI equation. This calculation has not been validated in all clinical situations. eGFR's persistently <60 mL/min signify possible  Chronic Kidney Disease.    Anion gap 8 5 - 15  Basic metabolic panel     Status: Abnormal   Collection Time: 03/19/17  4:32 AM  Result Value Ref Range   Sodium 141 135 - 145 mmol/L   Potassium 4.0 3.5 - 5.1 mmol/L   Chloride 106 101 - 111 mmol/L   CO2 24 22 - 32 mmol/L   Glucose, Bld 97 65 - 99 mg/dL   BUN 19 6 - 20 mg/dL   Creatinine, Ser 1.14 (H) 0.44 - 1.00 mg/dL   Calcium 9.4 8.9 - 10.3 mg/dL   GFR calc non Af Amer 48 (L) >60 mL/min   GFR calc Af Amer 55 (L) >60 mL/min    Comment: (NOTE) The eGFR has been calculated using the CKD EPI equation. This calculation has not been validated in all clinical situations. eGFR's persistently <60 mL/min signify possible Chronic Kidney Disease.    Anion gap 11 5 - 15     Lipid Panel     Component Value Date/Time   CHOL 131 03/15/2017 0725   TRIG 175 (H) 03/15/2017 0725   HDL 34 (L) 03/15/2017 0725   CHOLHDL 3.9 03/15/2017 0725   VLDL 35 03/15/2017 0725   LDLCALC 62 03/15/2017 0725     Lab Results  Component Value Date   HGBA1C 5.7 (H) 03/15/2017        HPI :  71 y.o. right handed female with history of hypertension, prior tobacco abuse and recently diagnosed with CVA about 3 weeks ago while in [place] with left-sided weakness and placed on aspirin and Plavix for CVA prophylaxis. She was later discharged to home but by report of family unable to ambulate. She was subsequently brought to the [place] by her daughter for further management and presented to [facility] 03/14/2017. Per chart review patient is from [place] had been living in [place] for the past 30 years alone and was independent. She has family in [place] who can assist as needed. A CT of the head showed cerebral atrophy, ventriculomegaly and periventricular white matter disease. Suspect lacunar type left basal ganglia infarct. MRI of the brain showed multiple subacute infarcts in multiple vascular territories suggesting embolic disease from the heart  or ascending aorta. There was involvement of the right thalamus, left parieto-occipital junction region and right frontal paramedian brain.    HOSPITAL COURSE:   Embolic Strokes: Left MCA, right thalamus, right ACA infarct,embolic pattern - likely secondary to paroxysmal A. fib  Resultant Disorientation, left hemiparesis. Disorientation resolved. Left hemiparesis improving.  MRI- Multiple subacute infarctions including Left MCA, right thalamus,  right ACA suggesting embolic disease  CTA H&N - left PCA occlusion, b/l fetal PCA, BA hypoplastic, occluded left vertebral artery  2D Echo - EF 55-60%  Left lower extremity venous Doppler: No DVT noted. There is a superficial thrombosis noted in the left lesser saphenous vein and several thrombosed varicosities in the left calf.  Neurology recommended TEE and loop recorder for cardioembolic work up -this was not done as patient showed A. Fib on telemetry. Electrophysiology was consulted and anticoagulation with eliquis 5 mg twice a day was recommended  Aspirin and Plavix discontinued after discussion with Dr. Erlinda Hong  patient counseled to be compliant with herantithrombotic medications  Ongoing aggressive stroke risk factor management  Therapy recommendations: CIR      atrial fibrillation stated that she had arrhythmia long time ago, but not recently  Not sure about what type of arrhythmia. Patient was on carvedilol, verapamil PTA-unclear reason.  Neurology recommended TEE and loop recorder. Please see discussion above   Periods of intermittent but transient nonsustained narrow complex tachycardia, likely SVT >requested cardiology to see , they suspect paroxysmal a fib and recommend anticoagulation  Discussed with Dr. Erlinda Hong on 3/28> okay to start eliquis , continue home dose of carvedilol and verapamil.  Hypertension  Blood pressure is running high  Permissive hypertension (OK if <220/120) but gradually normalize in  5-7 days  Long-term BP goal normotensive  Continue carvedilol to 25 MG twice a day (was on 25 MG daily) and   verapamil 240 mg at bedtime. Also restart Cozaar as blood pressure still uncontrolled. Follow renal function closely   Hyperlipidemia  Home meds: Zocor 20 mg dailyresumed in hospital  LDL 62, goal < 70  Continue statin at discharge  Acute cystitis, uncomplicated (Escherichia coli and Proteus) - Completed 3 days ceftriaxone. Urine culture results as above  Left knee/leg pain - Lower extremity venous Doppler negative. Gives history of fall at home in [place] 3-4 weeks back. Does not seem neuropathic pain. Possibly arthritic pain. X-ray of left knee without acute findings. Symptomatic treatment. Improved.  Anemia Follow CBCs periodically.    Discharge Exam: *  Blood pressure (!) 179/72, pulse 72, temperature 98.4 F (36.9 C), temperature source Oral, resp. rate 20, height _0  (1.575 m), weight 63 kg (138 lb 14.2 oz), SpO2 99 %.  General exam: Pleasant elderly female lying comfortably propped up in bed. Respiratory system: Clear to auscultation. Respiratory effort normal. Cardiovascular system: S1 & S2 heard, RRR. No JVD, murmurs, rubs, gallops or clicks. No pedal edema. telemetry: Sinus rhythm. Couple of episodes of nonsustained narrow complex tachycardia, likely SVT (on 3/25 at 12:16 PM and 11:48 PM.) Gastrointestinal system: Abdomen is nondistended, soft and nontender. No organomegaly or masses felt. Normal bowel sounds heard. Central nervous system: Alert and oriented. No focal neurological deficits. Extremities: 5 x 5 power in right limbs. 4+ by 5 power in left upper extremity. 2 x 5 power in left lower extremity. No acute MSS findings on exam of left knee and left leg. Skin: No rashes, lesions or ulcers Psychiatry: Judgement and insight impaired. Mood & affect appropriate. ?? Some confusion at times.    Follow-up Information    Xu,Jindong,  MD. Call.   Specialty:  Neurology Why:  To make follow-up appointment, follow-up in 4-6 weeks Contact information: [address] [place] [address] [phone]        Primary care provider. Call.   Why:  Hospital follow-up  SignedReyne Dumas 03/19/2017, 9:30 AM        Time spent >45 mins

## 2017-03-19 NOTE — Progress Notes (Signed)
Renee PA is cancelling patient's TEE due to finding Atrial Fibrillation on her monitor.

## 2017-03-19 NOTE — Care Management Note (Signed)
Case Management Note  Patient Details  Name: Christina [name] MRN: [id number] Date of Birth: [birth year]  Subjective/Objective:                    Action/Plan: Pt discharging to CIR. No further needs per CM.   Expected Discharge Date:  03/19/17               Expected Discharge Plan:  IP Rehab Facility (To be determined)  In-House Referral:  Clinical Social Work  Discharge planning Services  CM Consult  Post Acute Care Choice:    Choice offered to:     DME Arranged:    DME Agency:     HH Arranged:    HH Agency:     Status of Service:  Completed, signed off  If discussed at [employer] Meetings, dates discussed:    Additional Comments:  Kermit BaloKelli F Jennfer Gassen, RN 03/19/2017, 2:14 PM

## 2017-03-19 NOTE — Progress Notes (Signed)
Rehab admissions - Patient has been cleared by attending MD for acute inpatient rehab admission.  Bed available and will admit to acute inpatient rehab today.  Call me for questions.  #[phone]

## 2017-03-19 NOTE — Progress Notes (Signed)
Received pt. As a transfer from 5 M.Pt. And family oriented to unit routine and protocol.Safety plan was explained,fall prevention plan was explained and signed by pt's son and RN.

## 2017-03-19 NOTE — Progress Notes (Signed)
STROKE TEAM PROGRESS NOTE   SUBJECTIVE (INTERVAL HISTORY) No family is at the bedside. No acute event overnight. Tele showed afib episode. TEE cancelled. Pt was put on eliquis. She still complains of left knee pain.    OBJECTIVE Temp:  [98 F (36.7 C)-99.1 F (37.3 C)] 99.1 F (37.3 C) (03/28 1749) Pulse Rate:  [64-81] 72 (03/28 1749) Cardiac Rhythm: Normal sinus rhythm (03/28 0700) Resp:  [18-20] 18 (03/28 1749) BP: (154-182)/(53-72) 173/64 (03/28 1749) SpO2:  [97 %-100 %] 98 % (03/28 1749)  CBC:   Recent Labs Lab 03/14/17 1152 03/17/17 0504 03/18/17 0346  WBC 8.1 6.3 6.1  NEUTROABS 5.9  --   --   HGB 11.8* 10.5* 10.6*  HCT 34.8* 32.2* 31.7*  MCV 81.7 83.6 84.8  PLT 280 232 223    Basic Metabolic Panel:   Recent Labs Lab 03/18/17 1021 03/19/17 0432  NA 141 141  K 4.5 4.0  CL 107 106  CO2 26 24  GLUCOSE 109* 97  BUN 15 19  CREATININE 1.04* 1.14*  CALCIUM 9.8 9.4    Lipid Panel:     Component Value Date/Time   CHOL 131 03/15/2017 0725   TRIG 175 (H) 03/15/2017 0725   HDL 34 (L) 03/15/2017 0725   CHOLHDL 3.9 03/15/2017 0725   VLDL 35 03/15/2017 0725   LDLCALC 62 03/15/2017 0725   HgbA1c:  Lab Results  Component Value Date   HGBA1C 5.7 (H) 03/15/2017   Urine Drug Screen: No results found for: LABOPIA, COCAINSCRNUR, LABBENZ, AMPHETMU, THCU, LABBARB    IMAGING I have personally reviewed the radiological images below and agree with the radiology interpretations.  Ct Head Wo Contrast 03/14/2017 Cerebral atrophy, ventriculomegaly and periventricular white matter disease. Suspect lacunar type left basal ganglia infarcts. No hemispheric infarction or intracranial hemorrhage. Moderate vascular calcifications.   Mr Brain Wo Contrast 03/14/2017 Multiple subacute infarctions in multiple vascular territories, suggesting embolic disease from the heart or ascending aorta. There is involvement of the right thalamus, left parieto-occipital junction region and  right frontal para median brain. Chronic small-vessel ischemic changes elsewhere as described. Atrophy. Ventricular prominence probably secondary to ex vacuo enlargement. Normal pressure hydrocephalus not excluded, but not specifically favored.   CTA head ane neck No significant carotid stenosis or proximal anterior circulation disease. 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 LEFT PCA occlusion. Subacute RIGHT frontal infarct shows mild postcontrast enhancement consistent with an insult a few weeks ago.  TTE - Left ventricle: The cavity size was normal. Wall thickness was   normal. Systolic function was normal. The estimated ejection   fraction was in the range of 55% to 60%. Wall motion was normal;   there were no regional wall motion abnormalities. Doppler   parameters are consistent with abnormal left ventricular   relaxation (grade 1 diastolic dysfunction). Doppler parameters   are consistent with high ventricular filling pressure. - Aortic valve: Valve area (VTI): 1.88 cm^2. Valve area (Vmax):   1.81 cm^2. Valve area (Vmean): 1.64 cm^2. - Mitral valve: Mildly calcified annulus. Normal thickness leaflets   . There was mild regurgitation. - Left atrium: The atrium was severely dilated. - Atrial septum: No defect or patent foramen ovale was identified. - Technically adequate study.  LE venous doppler - There is no DVT noted in the left lower extremity.  There is superficial thrombosis noted in the  left lesser saphenous vein and in varicosities in the left calf.    PHYSICAL EXAM Temp:  [98 F (36.7 C)-99.1 F (37.3 C)] 99.1 F (37.3 C) (03/28 1749) Pulse Rate:  [64-81] 72 (03/28 1749) Resp:  [18-20] 18 (03/28 1749) BP: (154-182)/(53-72) 173/64 (03/28 1749) SpO2:  [97 %-100 %] 98 % (03/28  1749)  General - Well nourished, well developed, in no apparent distress.  Ophthalmologic - Fundi not visualized due to eye movement.  Cardiovascular - Regular rate and rhythm.  Mental Status -  Level of arousal and orientation to month and person were intact, however, not orientated to place and year. Language including expression, naming, repetition, comprehension was assessed and found intact, mild dysarthria.  Cranial Nerves II - XII - II - Visual field intact OU. III, IV, VI - Extraocular movements intact. V - Facial sensation intact bilaterally. VII - Facial movement intact bilaterally. VIII - Hearing & vestibular intact bilaterally. X - Palate elevates symmetrically. XI - Chin turning & shoulder shrug intact bilaterally. XII - Tongue protrusion intact.  Motor Strength - [name] strength was normal in RUE and RLE, however, left UE 4/5 and LLE 2/5 proximal, 3/5 keen extension with DF 3+/5 distally and pronator drift was present.  Bulk was normal and fasciculations were absent.   Motor Tone - Muscle tone was assessed at the neck and appendages and was normal.  Reflexes - [name] reflexes were 1+ in all extremities and she had no pathological reflexes.  Sensory - Light touch, temperature/pinprick were assessed and were symmetrical.    Coordination - The patient had normal movements in the hands with no ataxia or dysmetria.  Tremor was absent.  Gait and Station - deferred   ASSESSMENT/PLAN [name] is a 71 y.o. female with history of hypertension and a stroke in [place] 3 weeks ago with subsequent left hemiparesis, presenting with diplopia and headache. She did not receive IV t-PA due to recent stroke history.   Strokes:  Left MCA, right thalamus, right ACA infarct, embolic pattern - source unclear.  Resultant  Disorientation, left hemiparesis  MRI - Multiple subacute infarctions including Left MCA, right thalamus, right ACA  suggesting embolic  disease  CTA H&N - left PCA occlusion, b/l fetal PCA, BA hypoplastic  2D Echo - EF 55-60%  LE venous Dopplers - no DVT  Tele showed afib episode  LDL - 62  HgbA1c - 5.7  VTE prophylaxis - Lovenox Diet - low sodium heart healthy  aspirin 325 mg daily and clopidogrel 75 mg daily prior to admission, now changed to anticoagulation with eliquis 5mg  bid.   Patient counseled to be compliant with her antithrombotic medications  Ongoing aggressive stroke risk factor management  Therapy recommendations: CIR  Disposition:  Pending  Paroxysmal afib  Pt stated that she had arrhythmia long time ago, but not recently  Not sure about what type of arrhythmia  On verapmil but not sure what the reason she is on it  On tele, pt was found to have afib  Pt was started with eliquis 5mg  bid  Hypertension  Blood pressure is running high  Coreg resumed.  Long-term BP goal normotensive  Hyperlipidemia  Home meds:  Zocor 20 mg daily resumed in hospital  LDL 62, goal < 70  Continue statin at discharge  Other Stroke Risk Factors  Advanced age  Other Active Problems  Mild anemia - 11.8 / 34.8  UTI - on rocephin started 03/15/17  Hospital day # 4  Neurology will sign off. Please call with questions. [facility]. Thanks for the consult.   Marvel PlanJindong John Vasconcelos, MD PhD Stroke Neurology 03/19/2017 7:00 PM    To contact Stroke Continuity provider, please refer to [email or web address]. After hours, contact General Neurology

## 2017-03-19 NOTE — PMR Pre-admission (Signed)
PMR Admission Coordinator Pre-Admission Assessment  Patient: Christina [name] is an 71 y.o., female MRN: [id number] DOB: 03-28-1946 Height: 5\' 2"  (157.5 cm) Weight: 63 kg (138 lb 14.2 oz)              Insurance Information Self pay - no insurance (had medicaid in [place]  [place] Application Date:        Case Manager:   Disability Application Date:        Case Worker:    Emergency Conservator, museum/gallery Information    Name Relation Home Work Mobile   Hidalgo,[name] Daughter [phone]       Current Medical History  Patient Admitting Diagnosis:  B CVA  History of Present Illness: A 71 y.o.right handed femalewith history of hypertension, prior tobacco abuse and recently diagnosed with CVA about 3 weeks ago while in [place] with left-sided weakness and placed on aspirin and Plavix for CVA prophylaxis. She was later discharged to home but by report of family unable to ambulate. She was subsequently brought to the [place] by her daughter for further management and presented to [facility] 03/14/2017.Per chart review patient is from [place] had been living in [place] for the past 30 years alone and was independent. She has family in [place] who can assist as needed.A CT of the head showed cerebral atrophy, ventriculomegaly and periventricular white matter disease. Suspect lacunar type left basal ganglia infarct. MRI of the brain showed multiple subacute infarcts in multiple vascular territories suggesting embolic disease from the heart or ascending aorta. There was involvement of the right thalamus, left parieto-occipital junction region and right frontal paramedian brain. CT angiogram head and neck showed no significant carotid stenosis or proximal anterior circulation disease. 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 Left PCA occlusion. Venous Dopplers lower extremities showed superficial  thrombosis noted in the left lesser saphenous vein and in the varicosities of the Left calf. Echocardiogram with ejection fraction of 60% grade 1 diastolic dysfunction.  Patient remains on aspirin and Plavix for CVA prophylaxisHowever telemetry showed bouts of atrial fibrillation and was changed to Eliquis. No plan for TEE after telemetry monitored indicate atrial fibrillation.Marland Kitchen Physical and occupational therapy evaluations completed with recommendations of physical medicine rehabilitation consult.  Patient to be admitted for a comprehensive inpatient rehabilitation program.   Total: 5=NIH  Past Medical History  Past Medical History:  Diagnosis Date  . Hypertension   . Stroke [facility])     Family History  family history includes Diabetes in her mother; Heart disease in her father, mother, sister, and sister.  Prior Rehab/Hospitalizations: No previous rehab.  Has the patient had major surgery during 100 days prior to admission? No  Current Medications   Current Facility-Administered Medications:  .  acetaminophen (TYLENOL) tablet 650 mg, 650 mg, Oral, Q4H PRN, 650 mg at 03/18/17 1442 **OR** acetaminophen (TYLENOL) solution 650 mg, 650 mg, Per Tube, Q4H PRN **OR** acetaminophen (TYLENOL) suppository 650 mg, 650 mg, Rectal, Q4H PRN, Costin Otelia Sergeant, MD .  apixaban (ELIQUIS) tablet 5 mg, 5 mg, Oral, BID, Richarda Overlie, MD .  carvedilol (COREG) tablet 12.5 mg, 12.5 mg, Oral, BID WC, Elease Etienne, MD, 12.5 mg at 03/19/17 8295 .  labetalol (NORMODYNE,TRANDATE) injection 10 mg, 10 mg, Intravenous, Q2H PRN, Leatha Gilding, MD, 10 mg at 03/16/17 2037 .  latanoprost (XALATAN) 0.005 % ophthalmic solution 1 drop, 1 drop, Both Eyes, BH-q7a, Costin M Gherghe,  MD, 1 drop at 03/19/17 0604 .  losartan (COZAAR) tablet 100 mg, 100 mg, Oral, Daily, Richarda Overlie, MD, 100 mg at 03/19/17 0938 .  senna-docusate (Senokot-S) tablet 1 tablet, 1 tablet, Oral, QHS PRN, Leatha Gilding, MD .  simvastatin (ZOCOR)  tablet 20 mg, 20 mg, Oral, Daily, Leatha Gilding, MD, 20 mg at 03/19/17 1610 .  temazepam (RESTORIL) capsule 15 mg, 15 mg, Oral, QHS, Leatha Gilding, MD, 15 mg at 03/18/17 2133 .  verapamil (CALAN-SR) CR tablet 240 mg, 240 mg, Oral, QHS, Elease Etienne, MD, 240 mg at 03/18/17 2134 .  zolpidem (AMBIEN) tablet 5 mg, 5 mg, Oral, QHS PRN, Leda Gauze, NP, 5 mg at 03/18/17 2133  Patients Current Diet: Diet - low sodium heart healthy Diet Heart Room service appropriate? Yes; Fluid consistency: Thin  Precautions / Restrictions Precautions Precautions: Fall Precaution Comments: Inconsistent impairments throughout session. Restrictions Weight Bearing Restrictions: No   Has the patient had 2 or more falls or a fall with injury in the past year?Yes.  Reports at least 3 falls where she hit her head and her bottom.  Prior Activity Level Community (5-7x/wk): Went out daily, very active, walked a lot, not driving.  Was in [place]  Home Assistive Devices / Equipment Home Assistive Devices/Equipment: None Home Equipment: Wheelchair - manual  Prior Device Use: Indicate devices/aids used by the patient prior to current illness, exacerbation or injury? None  Prior Functional Level Prior Function Level of Independence: Needs assistance Gait / Transfers Assistance Needed: Daughter reports that she has been doing ~75% of the work when assisting her. Could take a few steps from bed to w/c and then from w/c to toilet.  ADL's / Homemaking Assistance Needed: Pt able to assist minimally with sponge bathing and self-care but daughter reports she has been doing most of it for her since her stroke 3 weeks ago  Self Care: Did the patient need help bathing, dressing, using the toilet or eating?  Independent  Indoor Mobility: Did the patient need assistance with walking from room to room (with or without device)? Independent  Stairs: Did the patient need assistance with internal or external  stairs (with or without device)? Independent  Functional Cognition: Did the patient need help planning regular tasks such as shopping or remembering to take medications? Independent  Current Functional Level Cognition  Arousal/Alertness: Awake/alert Overall Cognitive Status: Impaired/Different from baseline Current Attention Level: Selective Orientation Level: Oriented X4 Following Commands: Follows one step commands with increased time, Follows one step commands inconsistently Safety/Judgement: Decreased awareness of safety, Decreased awareness of deficits General Comments: Multimodal cues required for follow through of tasks. Easily distracted by family in room.  Attention: Focused, Sustained, Selective Focused Attention: Impaired Focused Attention Impairment: Verbal basic, Functional basic Sustained Attention: Impaired Sustained Attention Impairment: Verbal basic, Functional basic Selective Attention: Impaired Selective Attention Impairment: Verbal basic, Functional basic Memory: Impaired Memory Impairment: Decreased short term memory, Storage deficit, Decreased recall of new information Decreased Short Term Memory: Verbal basic, Functional basic Awareness: Impaired Awareness Impairment: Intellectual impairment Problem Solving: Impaired Problem Solving Impairment: Verbal basic, Functional basic Executive Function: Sequencing, Initiating Sequencing: Impaired Sequencing Impairment: Verbal basic, Functional basic Initiating: Impaired Initiating Impairment: Verbal basic, Functional basic Safety/Judgment: Impaired    Extremity Assessment (includes Sensation/Coordination)  Upper Extremity Assessment: LUE deficits/detail, Generalized weakness LUE Deficits / Details: Pt "unable" to reach full elbow extension, but able to hold 90 degrees forward flexion from elbow for 10 seconds with eyes closed.  Pt with good grip strength and able to resist and pull at elbow flexion LUE Sensation:   (WFL per Pt report)  Lower Extremity Assessment: Defer to PT evaluation LLE Deficits / Details: Pt reports she is not able to move the LLE "at all", however demonstrated some active ankle DF/PF, and knee flexion in supine. In sitting, pt able to somewhat extend the L knee (very inconsistent with how much she is able to do actively). Unclear whether pain in the L knee was limiting her or if it was weakness.    ADLs  Overall ADL's : Needs assistance/impaired Eating/Feeding: Set up, Sitting Grooming: Moderate assistance, Standing (+2 assist for posture and standing balance) Grooming Details (indicate cue type and reason): Pt able to brush her teeth with maximum verbal and tactile for posture along with hands on facilitation.  Upper Body Bathing: Moderate assistance Lower Body Bathing: Maximal assistance Upper Body Dressing : Minimal assistance Lower Body Dressing: Moderate assistance, Sitting/lateral leans Lower Body Dressing Details (indicate cue type and reason): Donned R sock in sitting with multimodal cues and required mod assist for L LE. Toilet Transfer: Maximal assistance, +2 for physical assistance, +2 for safety/equipment, BSC, RW, Stand-pivot Functional mobility during ADLs: +2 for physical assistance, Moderate assistance General ADL Comments: Pt stood at sink for grooming tasks with mirror biofeedback, moderate assistance, and hands on facilitation of functional posture. Facilitated L UE weight bearing during functional reaching tasks during ADL.  Pt required multimodal cues for attention to task.    Mobility  Overal bed mobility: Needs Assistance Bed Mobility: Sidelying to Sit, Rolling, Sit to Supine Rolling: Min assist Sidelying to sit: Mod assist Sit to supine: Max assist Sit to sidelying: Mod assist General bed mobility comments: Hand-over-hand assist for reaching RUE to L rail. Pt was able to assist more in transition to sitting this session, with max assist provided for return  to supine. Pt with overall difficulty with sequencing and increased time required to complete bed mobility.     Transfers  Overall transfer level: Needs assistance Equipment used: Rolling walker (2 wheeled) Transfers: Sit to/from Stand Sit to Stand: Mod assist, +2 physical assistance Stand pivot transfers: Mod assist, Max assist, +2 physical assistance General transfer comment: Increased assist to power-up to full stand in the room in front of family. When pt was alone with therapists, +2 mod assist was provided and pt assisted more in transfer. Increased time and frequent cues required for hand placement on seated surface for safety.     Ambulation / Gait / Stairs / Wheelchair Mobility  Ambulation/Gait Ambulation/Gait assistance: Max assist, +2 physical assistance Ambulation Distance (Feet): 7 Feet (4', 3') Assistive device: Rolling walker (2 wheeled) Gait Pattern/deviations: Step-to pattern, Decreased stance time - left, Decreased step length - left (Heavy L lean) General Gait Details: Mirror in front of patient for visual feedback of posture and sequencing. Pt required step-by-step cues of improving posture, leaning R, stepping L, stepping R with each attempt. Therapist provided assist to advance LLE. One seated rest break required. Gait velocity: Decreased Gait velocity interpretation: Below normal speed for age/gender    Posture / Balance Dynamic Sitting Balance Sitting balance - Comments: L lateral lean and poor postural control. Balance Overall balance assessment: Needs assistance Sitting-balance support: Bilateral upper extremity supported, Feet supported Sitting balance-Leahy Scale: Poor Sitting balance - Comments: L lateral lean and poor postural control. Postural control: Left lateral lean Standing balance support: Bilateral upper extremity supported Standing balance-Leahy Scale: Zero Standing balance comment:  L lateral lean requiring mod assist +2 with hands on facilitation  along with mirror biofeedback to correct.     Special needs/care consideration BiPAP/CPAP No CPM No Continuous Drip IV No Dialysis No        Life Vest No Oxygen No Special Bed No Trach Size No Wound Vac (area) No  Skin Healing bedsore on coccyx                           Bowel mgmt: Last BM 03/18/17 per report of daughter Bladder mgmt: Using BSC, some incontinence Diabetic mgmt No    Previous Home Environment Living Arrangements: Children  Lives With: Daughter Available Help at Discharge: Family, Available PRN/intermittently Type of Home: House Home Layout: One level Home Access: Level entry Bathroom Shower/Tub: Engineer, manufacturing systemsTub/shower unit Bathroom Toilet: Standard Bathroom Accessibility: Yes How Accessible: Accessible via walker Home Care Services: No  Discharge Living Setting Plans for Discharge Living Setting: House, Lives with (comment) (Lives with daughter.) Type of Home at Discharge: House Discharge Home Layout: One level Discharge Home Access: Level entry Does the patient have any problems obtaining your medications?: Yes (Describe) (Has no insurance.)  Social/Family/Support Systems Patient Roles: Parent (Has 2 sons, 1 daughter.) Contact Information: [place] Mamynthia Hidalgo Anticipated Caregiver: Dtr and family Anticipated Caregiver's Contact Information: Aram BeechamCynthia - dtr [phone]- [phone] Ability/Limitations of Caregiver: Dtr works 02-1129 pm.  Son in Social workerlaw is a Naval architecttruck driver. Caregiver Availability: Other (Comment) (Dtr aware of need for supervision after rehab discharge.) Discharge Plan Discussed with Primary Caregiver: Yes Is Caregiver In Agreement with Plan?: Yes Does Caregiver/Family have Issues with Lodging/Transportation while Pt is in Rehab?: No  Goals/Additional Needs Patient/Family Goal for Rehab: PT/OT/SLP mod I and supervision goals Expected length of stay: 9-14 days Cultural Considerations: Catholic, from [place] recently.  Speaks English. Dietary Needs: Heart diet, thin  liquids Equipment Needs: TBD Pt/Family Agrees to Admission and willing to participate: Yes Program Orientation Provided & Reviewed with Pt/Caregiver Including Roles  & Responsibilities: Yes  Decrease burden of Care through IP rehab admission: N/A  Possible need for SNF placement upon discharge: Not planned  Patient Condition: This patient's medical and functional status has changed since the consult dated: 03/17/17 in which the Rehabilitation Physician determined and documented that the patient's condition is appropriate for intensive rehabilitative care in an inpatient rehabilitation facility. See "History of Present Illness" (above) for medical update. Functional changes are: Currently requiring mod/max assist for transfers and max assist to ambulate 7 feet RW. Patient's medical and functional status update has been discussed with the Rehabilitation physician and patient remains appropriate for inpatient rehabilitation. Will admit to inpatient rehab today.  Preadmission Screen Completed By:  Trish MageLogue, Parisha Beaulac M, 03/19/2017 10:07 AM ______________________________________________________________________   Discussed status with Dr. Wynn BankerKirsteins on 03/19/17 at 1006 and received telephone approval for admission today.  Admission Coordinator:  Trish MageLogue, Belton Peplinski M, time 1007/Date 03/19/17

## 2017-03-20 ENCOUNTER — Inpatient Hospital Stay (HOSPITAL_COMMUNITY): Payer: Medicaid Other | Admitting: Occupational Therapy

## 2017-03-20 ENCOUNTER — Inpatient Hospital Stay (HOSPITAL_COMMUNITY): Payer: Medicaid Other | Admitting: Speech Pathology

## 2017-03-20 ENCOUNTER — Inpatient Hospital Stay (HOSPITAL_COMMUNITY): Payer: Medicaid Other | Admitting: Physical Therapy

## 2017-03-20 LAB — COMPREHENSIVE METABOLIC PANEL
ALBUMIN: 3 g/dL — AB (ref 3.5–5.0)
ALT: 25 U/L (ref 14–54)
ANION GAP: 9 (ref 5–15)
AST: 28 U/L (ref 15–41)
Alkaline Phosphatase: 57 U/L (ref 38–126)
BILIRUBIN TOTAL: 0.7 mg/dL (ref 0.3–1.2)
BUN: 19 mg/dL (ref 6–20)
CO2: 25 mmol/L (ref 22–32)
Calcium: 9.5 mg/dL (ref 8.9–10.3)
Chloride: 106 mmol/L (ref 101–111)
Creatinine, Ser: 1.15 mg/dL — ABNORMAL HIGH (ref 0.44–1.00)
GFR calc Af Amer: 55 mL/min — ABNORMAL LOW (ref >60)
GFR calc non Af Amer: 47 mL/min — ABNORMAL LOW (ref >60)
GLUCOSE: 106 mg/dL — AB (ref 65–99)
POTASSIUM: 4 mmol/L (ref 3.5–5.1)
Sodium: 140 mmol/L (ref 135–145)
TOTAL PROTEIN: 6 g/dL — AB (ref 6.5–8.1)

## 2017-03-20 LAB — CBC WITH DIFFERENTIAL/PLATELET
BASOS PCT: 1 %
Basophils Absolute: 0 10*3/uL (ref 0.0–0.1)
EOS ABS: 0.4 10*3/uL (ref 0.0–0.7)
EOS PCT: 7 %
HEMATOCRIT: 31.2 % — AB (ref 36.0–46.0)
Hemoglobin: 9.9 g/dL — ABNORMAL LOW (ref 12.0–15.0)
Lymphocytes Relative: 18 %
Lymphs Abs: 1 10*3/uL (ref 0.7–4.0)
MCH: 27 pg (ref 26.0–34.0)
MCHC: 31.7 g/dL (ref 30.0–36.0)
MCV: 85.2 fL (ref 78.0–100.0)
MONO ABS: 0.5 10*3/uL (ref 0.1–1.0)
MONOS PCT: 9 %
NEUTROS ABS: 3.6 10*3/uL (ref 1.7–7.7)
Neutrophils Relative %: 65 %
PLATELETS: 203 10*3/uL (ref 150–400)
RBC: 3.66 MIL/uL — ABNORMAL LOW (ref 3.87–5.11)
RDW: 13.6 % (ref 11.5–15.5)
WBC: 5.5 10*3/uL (ref 4.0–10.5)

## 2017-03-20 NOTE — Evaluation (Signed)
Physical Therapy Assessment and Plan  Patient Details  Name: Christina [name] MRN: [id number] Date of Birth: 02/18/1946  PT Diagnosis: Abnormal posture, Abnormality of gait, Cognitive deficits, Hemiplegia non-dominant, Hypertonia and Muscle weakness Rehab Potential: Good ELOS: 2-3 weeks   Today's Date: 03/20/2017 PT Individual Time:1100-1155   55 min   Problem List:  Patient Active Problem List   Diagnosis Date Noted  . 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 Left hemiparesis ([place])   . Primary osteoarthritis of left knee   . Left knee pain   . Diplopia   . Acute cystitis without hematuria   . Stroke ([place])   . TIA (transient ischemic attack) 03/14/2017  . History of CVA (cerebrovascular accident) 03/14/2017  . HTN (hypertension) 03/14/2017  . HLD (hyperlipidemia) 03/14/2017  . 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 Hypertension   . Stroke [facility])    Past Surgical History:  Past Surgical History:  Procedure Laterality Date  . ABDOMINAL HYSTERECTOMY    . EYE SURGERY      Assessment & Plan Clinical Impression: Christina Toledois a 71 y.o.right handed femalewith history of hypertension, prior tobacco abuse and recently diagnosed with CVA about 3 weeks ago while in [place] with left-sided weakness and placed on aspirin and Plavix for CVA prophylaxis. She was later discharged to home but by report of family unable to ambulate. She was subsequently brought to the [place] by her daughter for further management and presented to [facility] 03/14/2017.Per chart review patient is from [place] had been living in [place] for the past 30 years alone and was independent. She has family in [place] who can assist as needed.A CT of the head showed cerebral atrophy, ventriculomegaly and periventricular white matter disease. Suspect lacunar type left basal ganglia infarct. MRI of the brain showed multiple subacute  infarcts in multiple vascular territories suggesting embolic disease from the heart or ascending aorta. There was involvement of the right thalamus, left parieto-occipital junction region and right frontal paramedian brain. CT angiogram head and neck showed no significant carotid stenosis or proximal anterior circulation disease. 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 Left PCA occlusion. Venous Dopplers lower extremities showed superficial thrombosis noted in the left lesser saphenous vein and in the varicosities of the Left calf. Echocardiogram with ejection fraction of 29% grade 1 diastolic dysfunction.  Patient remains on aspirin and Plavix for CVA prophylaxisHowever telemetry showed bouts of atrial fibrillation and was changed to Eliquis. No plan for TEE after telemetry monitored indicate atrial fibrillation. Patient transferred to CIR on 03/19/2017.   Patient currently requires max with mobility secondary to muscle weakness, muscle joint tightness and muscle paralysis, decreased cardiorespiratoy endurance, impaired timing and sequencing, abnormal tone, unbalanced muscle activation, motor apraxia and decreased motor planning, decreased attention to left and left side neglect, decreased initiation, decreased attention, decreased awareness, decreased problem solving, decreased safety awareness, decreased memory and delayed processing and decreased sitting balance, decreased standing balance, decreased postural control, hemiplegia and decreased balance strategies.  Prior to hospitalization, patient was independent  with mobility and lived with Daughter in a House home.  Home access is  Level entry.  Patient will benefit from skilled PT intervention to maximize safe functional mobility, minimize fall risk and decrease caregiver burden for planned discharge home with 24 hour supervision.  Anticipate patient will benefit from  follow up  HH at discharge.  PT - End of Session Activity Tolerance: Decreased this session;Tolerates < 10 min activity, no significant change in vital signs Endurance Deficit: Yes PT Assessment Rehab Potential (ACUTE/IP ONLY): Good Barriers to Discharge: Decreased caregiver support PT Patient demonstrates impairments in the following area(s): Balance;Behavior;Endurance;Motor;Nutrition;Pain;Perception;Safety PT Transfers Functional Problem(s): Bed Mobility;Bed to Chair;Car PT Locomotion Functional Problem(s): Ambulation;Wheelchair Mobility;Stairs PT Plan PT Intensity: Minimum of 1-2 x/day ,45 to 90 minutes PT Frequency: 5 out of 7 days PT Duration Estimated Length of Stay: 2-3 weeks PT Treatment/Interventions: Ambulation/gait training;Balance/vestibular training;Cognitive remediation/compensation;Community reintegration;Discharge planning;Disease management/prevention;DME/adaptive equipment instruction;Functional mobility training;Functional electrical stimulation;Neuromuscular re-education;Pain management;Patient/family education;Psychosocial support;Stair training;Splinting/orthotics;Therapeutic Activities;Therapeutic Exercise;UE/LE Strength taining/ROM;UE/LE Coordination activities;Wheelchair propulsion/positioning;Visual/perceptual remediation/compensation PT Transfers Anticipated Outcome(s): supervision PT Locomotion Anticipated Outcome(s): supervision household PT Recommendation Recommendations for Other Services: Therapeutic Recreation consult Therapeutic Recreation Interventions: Pet therapy;Kitchen group Follow Up Recommendations: Home health PT;24 hour supervision/assistance Patient destination: Home Equipment Recommended: To be determined  Skilled Therapeutic Intervention Skilled therapeutic intervention initiated after completion of evaluation. Discussed with patient falls risk, safety within room, focus of therapy during stay, possible length of stay, goals, and follow-up  therapy. Trialled use of Stedy with initial mod A faded to S-min A, updated safety plan for use with nursing staff. While on Stedy, patient requesting to void. Transferred to [facility] over toilet via Stedy with assist for clothing management and total A for hygiene after continent bowel movement. Patient required hand over hand assist for both UEs at times for safe placement on Stedy and wheelchair. Patient demonstrates L neglect, apraxia, L hemiplegia, and impaired moPT Evaluationtor planning throughout session. Patient requires total A to safely complete squat pivot transfers due to difficulty sequencing. Patient demonstrated improved L sided activation with automatic tasks such as gait and stairs. Gait x 90 ft with +2 HHA and stair training up/down 8 (3") steps using 2 rails with max-total cues and hand over hand assist to advance LUE along rail and fully place L foot on step before stepping. Patient fatigued, performed ambulatory transfer from wheelchair to bed with assist of 1 person HHA and transferred sit > supine with mod A. Patient left supine in bed with needs in reach, 3 rails up, and bed alarm engaged.   Precautions/Restrictions Precautions Precautions: Fall Restrictions Weight Bearing Restrictions: No Pain  No c/o pain Home Living/Prior Functioning Home Living Available Help at Discharge: Family;Available PRN/intermittently Type of Home: House Home Access: Level entry Home Layout: One level Bathroom Shower/Tub: Chiropodist: Standard Bathroom Accessibility: Yes  Lives With: Daughter Prior Function Level of Independence: Independent with basic ADLs;Independent with homemaking with ambulation;Independent with gait;Independent with transfers  Able to Take Stairs?: Yes Driving: Yes Vocation: Retired Vision/Perception  Vision - Risk analyst: Within Passenger transport manager Range of Motion: Within Functional Limits Alignment/Gaze Preference: Within Defined  Limits Tracking/Visual Pursuits: Able to track stimulus in all quads without difficulty Praxis Praxis-Other Comments: demonstrates deficits with initiation with moving L limb during transfers  Cognition Overall Cognitive Status: Impaired/Different from baseline Arousal/Alertness: Awake/alert Orientation Level: Oriented to person;Disoriented to place;Disoriented to situation;Disoriented to time Focused Attention: Appears intact Sustained Attention: Impaired Sustained Attention Impairment: Verbal basic;Functional basic Selective Attention: Impaired Selective Attention Impairment: Verbal basic;Functional basic Memory: Impaired Memory Impairment: Decreased short term memory;Storage deficit;Decreased recall of new information;Retrieval deficit Decreased Short Term Memory: Verbal basic;Functional basic Awareness: Impaired Awareness Impairment: Intellectual impairment Problem Solving: Impaired Problem Solving Impairment: Verbal basic;Functional basic Executive Function: Sequencing;Initiating Sequencing: Impaired Sequencing Impairment: Verbal basic;Functional basic Initiating: Impaired Initiating Impairment:  Verbal basic;Functional basic Safety/Judgment: Impaired Sensation Sensation Light Touch: Appears Intact Stereognosis: Not tested Hot/Cold: Appears Intact Proprioception: Impaired Detail Proprioception Impaired Details: Impaired LUE;Impaired LLE Additional Comments: observed during transfers (decreased awareness of body position) Coordination Gross Motor Movements are Fluid and Coordinated: No Fine Motor Movements are Fluid and Coordinated: No Coordination and Movement Description: L hemiplegia Finger Nose Finger Test: not tested Motor  Motor Motor: Hemiplegia;Motor apraxia;Motor perseverations;Abnormal tone;Abnormal postural alignment and control Motor - Skilled Clinical Observations: pt will maintain grasp and needs physical cues to release  Mobility Bed Mobility Bed  Mobility: Sit to Supine Sit to Supine: 3: Mod assist Transfers Transfers: Yes Sit to Stand: 2: Max assist (faded to min) Stand to Sit: 2: Max assist (faded to min) Squat Pivot Transfers: 1: +2 Total assist Locomotion  Ambulation Ambulation: Yes Ambulation/Gait Assistance: 1: +2 Total assist Ambulation Distance (Feet): 90 Feet Assistive device: 2 person hand held assist Gait Gait velocity: Decreased Stairs / Additional Locomotion Stairs: Yes Stairs Assistance: 2: Max assist Stair Management Technique: Two rails;Step to pattern;Forwards Number of Stairs: 8 Height of Stairs: 3 Wheelchair Mobility Wheelchair Mobility: No  Trunk/Postural Assessment  Cervical Assessment Cervical Assessment: Within Functional Limits Thoracic Assessment Thoracic Assessment: Within Functional Limits Lumbar Assessment Lumbar Assessment: Exceptions to [facility] (maintains hip flexion in standing, unable to achieve upright posture) Postural Control Postural Control: Deficits on evaluation (forward flexed at hips in standing)  Balance Dynamic Sitting Balance Dynamic Sitting - Level of Assistance: 4: Min assist Static Standing Balance Static Standing - Level of Assistance: 4: Min assist Dynamic Standing Balance Dynamic Standing - Level of Assistance: 2: Max assist Extremity Assessment  RUE Assessment RUE Assessment: Within Functional Limits LUE Assessment LUE Assessment: Exceptions to [facility] LUE AROM (degrees) Left Shoulder Flexion: 120 Degrees LUE Strength LUE Overall Strength Comments: 4-/5 in elbow and grasp RLE Assessment RLE Assessment: Within Functional Limits LLE Assessment LLE Assessment: Exceptions to WFL LLE PROM (degrees) Overall PROM Left Lower Extremity: Within functional limits for tasks assessed LLE Strength LLE Overall Strength: Deficits;Due to impaired cognition LLE Overall Strength Comments: grossly 3-/5 or less throughout, limited by apraxia/impaired cognition/hemiplegia LLE  Tone LLE Tone: Moderate;Hypertonic   See Function Navigator for Current Functional Status.   Refer to Care Plan for Long Term Goals  Recommendations for other services: Therapeutic Recreation  Pet therapy and Kitchen group  Discharge Criteria: Patient will be discharged from PT if patient refuses treatment 3 consecutive times without medical reason, if treatment goals not met, if there is a change in medical status, if patient makes no progress towards goals or if patient is discharged from hospital.  The above assessment, treatment plan, treatment alternatives and goals were discussed and mutually agreed upon: by patient  Marites Nath, Murray Hodgkins 03/20/2017, 5:07 PM

## 2017-03-20 NOTE — Evaluation (Signed)
Occupational Therapy Assessment and Plan  Patient Details  Name: Christina [name] MRN: [id number] Date of Birth: [birth year]  OT Diagnosis: apraxia, cognitive deficits and hemiplegia affecting non-dominant side Rehab Potential: Rehab Potential (ACUTE ONLY): Good ELOS: 21-23 days   Today's Date: 03/20/2017 OT Individual Time: 6945-0388 OT Individual Time Calculation (min): 65 min     Problem List:  Patient Active Problem List   Diagnosis Date Noted  . 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 Left hemiparesis ([place])   . Primary osteoarthritis of left knee   . Left knee pain   . Diplopia   . Acute cystitis without hematuria   . Stroke ([place])   . TIA (transient ischemic attack) 03/14/2017  . History of CVA (cerebrovascular accident) 03/14/2017  . HTN (hypertension) 03/14/2017  . HLD (hyperlipidemia) 03/14/2017  . 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 Hypertension   . Stroke [facility])    Past Surgical History:  Past Surgical History:  Procedure Laterality Date  . ABDOMINAL HYSTERECTOMY    . EYE SURGERY      Assessment & Plan Clinical Impression:Christina [name] is a 71 y.o. right handed female with history of hypertension, prior tobacco abuse and recently diagnosed with CVA about 3 weeks ago while in [place] with left-sided weakness and placed on aspirin and Plavix for CVA prophylaxis. She was later discharged to home but by report of family unable to ambulate. She was subsequently brought to the [place] by her daughter for further management and presented to [facility] 03/14/2017. Per chart review patient is from [place] had been living in [place] for the past 30 years alone and was independent. She has family in [place] who can assist as needed. A CT of the head showed cerebral atrophy, ventriculomegaly and periventricular white matter disease. Suspect lacunar type left basal ganglia infarct. MRI of the brain  showed multiple subacute infarcts in multiple vascular territories suggesting embolic disease from the heart or ascending aorta. There was involvement of the right thalamus, left parieto-occipital junction region and right frontal paramedian brain. CT angiogram head and neck showed no significant carotid stenosis or proximal anterior circulation disease. There was noted occluded left vertebral throughout much of its course likely chronic given the old left cerebellar infarct. Left PCA occlusion. Venous Dopplers lower extremities showed superficial thrombosis noted in the left lesser saphenous vein and in the varicosities of the Left calf. Echocardiogram with ejection fraction of 82% grade 1 diastolic dysfunction.  Patient remains on aspirin and Plavix for CVA prophylaxisHowever telemetry showed bouts of atrial fibrillation and was changed to Eliquis. No plan for TEE after telemetry monitored indicate atrial fibrillation.Marland Kitchen Physical and occupational therapy evaluations completed with recommendations of physical medicine rehabilitation consult.Patient was admitted for a comprehensive rehabilitation program  Patient transferred to CIR on 03/19/2017 .    Patient currently requires max with basic self-care skills secondary to motor apraxia and decreased coordination, left side neglect, decreased initiation, decreased attention, decreased awareness, decreased problem solving, decreased safety awareness, decreased memory and delayed processing and decreased sitting balance, decreased standing balance, decreased postural control and hemiplegia.  Prior to hospitalization, patient was very independent and active. Patient will benefit from skilled intervention to increase independence with basic self-care skills prior to discharge home with care partner.  Anticipate patient will require 24 hour supervision and follow up home health.  OT - End of Session Activity Tolerance: Tolerates 10 - 20 min  activity with multiple  rests OT Assessment Rehab Potential (ACUTE ONLY): Good OT Patient demonstrates impairments in the following area(s): Balance;Cognition;Motor;Perception;Safety;Sensory OT Basic ADL's Functional Problem(s): Grooming;Bathing;Dressing;Toileting OT Transfers Functional Problem(s): Toilet;Tub/Shower OT Additional Impairment(s): Fuctional Use of Upper Extremity OT Plan OT Intensity: Minimum of 1-2 x/day, 45 to 90 minutes OT Frequency: 5 out of 7 days OT Duration/Estimated Length of Stay: 21-23 days OT Treatment/Interventions: Balance/vestibular training;Discharge planning;Cognitive remediation/compensation;DME/adaptive equipment instruction;Functional mobility training;Neuromuscular re-education;Patient/family education;Self Care/advanced ADL retraining;Therapeutic Activities;Therapeutic Exercise;UE/LE Strength taining/ROM;UE/LE Coordination activities;Visual/perceptual remediation/compensation OT Self Feeding Anticipated Outcome(s): mod I OT Basic Self-Care Anticipated Outcome(s): supervision OT Toileting Anticipated Outcome(s): supervision OT Bathroom Transfers Anticipated Outcome(s): supervision OT Recommendation Patient destination: Home Follow Up Recommendations: Home health OT Equipment Recommended: Tub/shower bench   Skilled Therapeutic Intervention Pt seen for initial evaluation. Pt's daughter present for the first 5 minutes to give this therapist a history on pt's PLOF.  Explained to both the purpose of OT and possible POC.  Pt's dtr had to leave.  Pt then seen for ADL retraining with a focus on L side awareness, L side coordination and balance.  Pt demonstrating severe apraxia and with her L neglect the stand pivot transfer from bed to w/c to L and from w/c to tub bench and back required max A and max tactile/ verbal cues.  Pt also has great difficulty following 1 step directions as her processing speed is very delayed.  Pt completed shower and dressing. Recommend use of stedy for safe  transfers at this time. Chair alarm and quick release belt on. Pt with call light in reach.     OT Evaluation Precautions/Restrictions  Precautions Precautions: Fall Restrictions Weight Bearing Restrictions: No  Pain Pain Assessment Pain Assessment: 0-10 Pain Score: 0-No pain Pain Type: Acute pain Pain Location: Back Pain Orientation: Mid Pain Descriptors / Indicators: Aching Pain Onset: On-going Patients Stated Pain Goal: 0 Pain Intervention(s): Medication (See eMAR) Home Living/Prior [place] expects to be discharged to:: Private residence Living Arrangements: Children Available Help at Discharge: Family, Available PRN/intermittently Type of Home: House Home Access: Level entry Home Layout: One level Bathroom Shower/Tub: Optometrist: Yes  Lives With: Daughter Prior Function Level of Independence: Independent with basic ADLs, Independent with homemaking with ambulation, Independent with gait, Independent with transfers  Able to Take Stairs?: Yes Driving: Yes Vocation: Retired ADL ADL ADL Comments: refer to functional navigator Vision/Perception  Vision- History Baseline Vision/History: Wears glasses Wears Glasses: Reading only Patient Visual Report: No change from baseline Vision- Assessment Eye Alignment: Within Functional Limits Ocular Range of Motion: Within Functional Limits Alignment/Gaze Preference: Within Defined Limits Tracking/Visual Pursuits: Able to track stimulus in all quads without difficulty Visual Fields: No apparent deficits Retail buyer Comments: demonstrates deficits with initiation with moving L limb during transfers  Cognition Overall Cognitive Status: Impaired/Different from baseline Arousal/Alertness: Awake/alert Orientation Level: Person Year: 2018 Month: March Day of Week: Incorrect Memory: Impaired Memory Impairment: Decreased short term  memory;Storage deficit;Decreased recall of new information;Retrieval deficit Decreased Short Term Memory: Verbal basic;Functional basic Immediate Memory Recall: Sock;Blue;Bed Memory Recall:  (needed 2-3 cues to recall each one) Selective Attention: Impaired Selective Attention Impairment: Verbal basic;Functional basic Awareness: Impaired Awareness Impairment: Intellectual impairment Problem Solving: Impaired Problem Solving Impairment: Verbal basic;Functional basic Executive Function: Sequencing;Initiating Sequencing: Impaired Sequencing Impairment: Verbal basic;Functional basic Initiating: Impaired Initiating Impairment: Verbal basic;Functional basic Safety/Judgment: Impaired Sensation Sensation Light Touch: Appears Intact Stereognosis: Not tested Hot/Cold: Appears Intact Proprioception: Impaired by gross assessment Additional  Comments: observed during transfers (decreased awareness of body position) Coordination Gross Motor Movements are Fluid and Coordinated: No Fine Motor Movements are Fluid and Coordinated: No Coordination and Movement Description: limited strength and coordination in LUE and LLE Finger Nose Finger Test: not tested Motor  Motor Motor: Hemiplegia;Motor apraxia;Motor perseverations Motor - Skilled Clinical Observations: pt will maintain grasp and needs physical cues to release Mobility    refer to functional navigator Trunk/Postural Assessment  Cervical Assessment Cervical Assessment: Within Functional Limits Thoracic Assessment Thoracic Assessment: Within Functional Limits Postural Control Postural Control: Deficits on evaluation (forward flexed at hips in standing)  Balance Dynamic Sitting Balance Dynamic Sitting - Level of Assistance: 4: Min assist Static Standing Balance Static Standing - Level of Assistance: 4: Min assist Dynamic Standing Balance Dynamic Standing - Level of Assistance: 2: Max assist Extremity/Trunk Assessment RUE  Assessment RUE Assessment: Within Functional Limits LUE Assessment LUE Assessment: Exceptions to [facility] LUE AROM (degrees) Left Shoulder Flexion: 120 Degrees LUE Strength LUE Overall Strength Comments: 4-/5 in elbow and grasp   See Function Navigator for Current Functional Status.   Refer to Care Plan for Long Term Goals  Recommendations for other services: None    Discharge Criteria: Patient will be discharged from OT if patient refuses treatment 3 consecutive times without medical reason, if treatment goals not met, if there is a change in medical status, if patient makes no progress towards goals or if patient is discharged from hospital.  The above assessment, treatment plan, treatment alternatives and goals were discussed and mutually agreed upon: by patient  [facility] 03/20/2017, 1:39 PM

## 2017-03-20 NOTE — Progress Notes (Signed)
Patient information reviewed and entered into eRehab system by Kennady Zimmerle, RN, CRRN, PPS Coordinator.  Information including medical coding and functional independence measure will be reviewed and updated through discharge.    

## 2017-03-20 NOTE — Progress Notes (Signed)
Ranelle Oyster, MD Physician Signed Physical Medicine and Rehabilitation  Consult Note Date of Service: 03/17/2017 6:13 AM  Related encounter: ED to Hosp-Admission (Discharged) from 03/14/2017 in [facility] 58M NEURO MEDICAL     Expand All Collapse All   [] Hide copied text [] Hover for attribution information      Physical Medicine and Rehabilitation Consult Reason for Consult: Left MCA, right thalamus, right ACA infarct, embolic pattern Referring Physician: [facility]   HPI: Christina [name] is a 71 y.o. right handed female with history of hypertension, prior tobacco abuse and recently diagnosed with CVA about 3 weeks ago while in [place] with left-sided weakness and placed on aspirin and Plavix for CVA prophylaxis. She was later discharged to home but by report of family unable to ambulate. She was subsequently brought to the [place] by her daughter for further management and presented to [facility] 03/14/2017. Per chart review patient is from [place] had been living in [place] for the past 30 years alone and was independent. She has family in [place] who can assist as needed. A CT of the head showed cerebral atrophy, ventriculomegaly and periventricular white matter disease. Suspect lacunar type left basal ganglia infarct. MRI of the brain showed multiple subacute infarcts in multiple vascular territories suggesting embolic disease from the heart or ascending aorta. There was involvement of the right thalamus, left parieto-occipital junction region and right frontal paramedian brain. CT angiogram head and neck showed no significant carotid stenosis or proximal anterior circulation disease. There was noted occluded left vertebral throughout much of its course likely chronic given the old left cerebellar infarct. Left PCA occlusion. Venous Dopplers lower extremities showed superficial thrombosis noted in the left lesser saphenous vein and in the varicosities of the  Left calf. Echocardiogram with ejection fraction of 60% grade 1 diastolic dysfunction. Neurology continues to follow and await plan for TEE loop recorder. Patient remains on aspirin and Plavix for CVA prophylaxis. Subcutaneous Lovenox for DVT prophylaxis. Physical and occupational therapy evaluations completed with recommendations of physical medicine rehabilitation consult.   Review of Systems  Constitutional: Negative for chills and fever.  HENT: Negative for hearing loss and tinnitus.   Eyes: Negative for blurred vision and double vision.  Respiratory: Negative for cough and shortness of breath.   Cardiovascular: Positive for leg swelling. Negative for chest pain and palpitations.  Gastrointestinal: Positive for constipation. Negative for nausea and vomiting.  Genitourinary: Negative for hematuria.  Musculoskeletal: Positive for myalgias. Negative for joint pain.  Skin: Negative for rash.  Neurological: Positive for sensory change and weakness. Negative for seizures.       Intermittent headaches  Psychiatric/Behavioral: The patient has insomnia.   All other systems reviewed and are negative.      Past Medical History:  Diagnosis Date  . Hypertension   . Stroke [facility])         Past Surgical History:  Procedure Laterality Date  . ABDOMINAL HYSTERECTOMY    . EYE SURGERY     History reviewed. No pertinent family history. Social History:  reports that she has never smoked. She has never used smokeless tobacco. She reports that she does not drink alcohol or use drugs. Allergies: No Known Allergies       Medications Prior to Admission  Medication Sig Dispense Refill  . aspirin 325 MG tablet Take 325 mg by mouth daily.    . carvedilol (COREG) 25 MG tablet Take 25 mg by mouth daily.    Marland Kitchen  clopidogrel (PLAVIX) 75 MG tablet Take 75 mg by mouth daily.    Marland Kitchen. latanoprost (XALATAN) 0.005 % ophthalmic solution Place 1 drop into both eyes every morning.    Marland Kitchen. losartan (COZAAR)  50 MG tablet Take 50 mg by mouth daily.    . simvastatin (ZOCOR) 20 MG tablet Take 20 mg by mouth daily.    . temazepam (RESTORIL) 15 MG capsule Take 15 mg by mouth at bedtime.    . verapamil (CALAN-SR) 240 MG CR tablet Take 240 mg by mouth at bedtime.      Home: Home Living Family/patient expects to be discharged to:: Private residence Living Arrangements: Children Available Help at Discharge: Family, Available PRN/intermittently Type of Home: House Home Access: Level entry Home Layout: One level Bathroom Shower/Tub: Engineer, manufacturing systemsTub/shower unit Bathroom Toilet: Standard Bathroom Accessibility: Yes Home Equipment: Wheelchair - manual  Lives With: Daughter  Functional History: Prior Function Level of Independence: Needs assistance Gait / Transfers Assistance Needed: Daughter reports that she has been doing ~75% of the work when assisting her. Could take a few steps from bed to w/c and then from w/c to toilet.  ADL's / Homemaking Assistance Needed: Pt able to assist minimally with sponge bathing and self-care but daughter reports she has been doing most of it for her since her stroke 3 weeks ago Functional Status:  Mobility: Bed Mobility Overal bed mobility: Needs Assistance Bed Mobility: Sidelying to Sit Rolling: Mod assist Sidelying to sit: Mod assist Sit to sidelying: Mod assist General bed mobility comments: Cues for technique; Heavy mod assist to elevate trunk to sit; manual contacts at pelvis and shoulder to faciliate shifting weight onto R pelvis/ischial tuberosity as she stalled in sitting in a heavy L lean Transfers Overall transfer level: Needs assistance Equipment used: 2 person hand held assist, Rolling walker (2 wheeled) Transfers: Sit to/from Stand, Stand Pivot Transfers Sit to Stand: Mod assist Stand pivot transfers: Max assist General transfer comment: Cues for hand placement, forward weight shift, and technique; heavy mod assist to power up with close guard of L  knee; Max assist to transfer to chair as the L knee did buckle; performed 3 reps of sit to stand: with one person assist, to RW and to sink Ambulation/Gait Ambulation/Gait assistance: +2 physical assistance, Mod assist Ambulation Distance (Feet):  (2 steps forward, chair pulled behind) Assistive device: Rolling walker (2 wheeled) Gait Pattern/deviations: Step-to pattern, Decreased stance time - left, Decreased step length - left (Heavy L lean) General Gait Details: Heavy L lean, requiring heavy mod assist to support while advancing R foot; mod assist to advance L foot as well; difficulty maintianing fully upright standing  ADL: ADL Overall ADL's : Needs assistance/impaired Eating/Feeding: Set up, Sitting Grooming: Set up, Sitting Upper Body Bathing: Moderate assistance Lower Body Bathing: Maximal assistance Upper Body Dressing : Minimal assistance Lower Body Dressing: Maximal assistance Toilet Transfer: Maximal assistance, +2 for physical assistance, +2 for safety/equipment, BSC, RW, Stand-pivot Functional mobility during ADLs: +2 for physical assistance, Moderate assistance General ADL Comments: no actual transfers were attempted due to elevated blood pressure (182/80) spoke with RN who recommended no OOB activity at this time)  Cognition: Cognition Overall Cognitive Status: Impaired/Different from baseline Arousal/Alertness: Awake/alert Orientation Level: Oriented X4 Attention: Focused, Sustained, Selective Focused Attention: Impaired Focused Attention Impairment: Verbal basic, Functional basic Sustained Attention: Impaired Sustained Attention Impairment: Verbal basic, Functional basic Selective Attention: Impaired Selective Attention Impairment: Verbal basic, Functional basic Memory: Impaired Memory Impairment: Decreased short term memory, Storage deficit, Decreased recall  of new information Decreased Short Term Memory: Verbal basic, Functional basic Awareness:  Impaired Awareness Impairment: Intellectual impairment Problem Solving: Impaired Problem Solving Impairment: Verbal basic, Functional basic Executive Function: Sequencing, Initiating Sequencing: Impaired Sequencing Impairment: Verbal basic, Functional basic Initiating: Impaired Initiating Impairment: Verbal basic, Functional basic Safety/Judgment: Impaired Cognition Arousal/Alertness: Awake/alert Behavior During Therapy: WFL for tasks assessed/performed Overall Cognitive Status: Impaired/Different from baseline Area of Impairment: Following commands, Safety/judgement, Problem solving Following Commands: Follows one step commands with increased time, Follows one step commands inconsistently Safety/Judgement: Decreased awareness of safety, Decreased awareness of deficits Problem Solving: Slow processing, Decreased initiation, Difficulty sequencing, Requires verbal cues General Comments: Emotionally labile during session; discussed living in [place], and became tearfull when talking about moving to [place]  Blood pressure (!) 204/76, pulse 76, temperature 98.4 F (36.9 C), temperature source Oral, resp. rate 18, height 5\' 2"  (1.575 m), weight 63 kg (138 lb 14.2 oz), SpO2 100 %. Physical Exam  Vitals reviewed. Constitutional: She appears well-developed.  HENT:  Head: Normocephalic.  Eyes: EOM are normal.  Neck: Normal range of motion. Neck supple. No thyromegaly present.  Cardiovascular: Normal rate and regular rhythm.   Respiratory: Effort normal and breath sounds normal. No respiratory distress.  GI: Soft. Bowel sounds are normal. She exhibits no distension.  Neurological: She is alert.  Mood is a bit flat but appropriate. She provides her main age and date of birth. Follows simple commands. Moves all 4's. A little more difficulty intiating with left arm/leg while working with therapy.   Skin: Skin is warm and dry.    Lab Results Last 24 Hours       Results for orders  placed or performed during the hospital encounter of 03/14/17 (from the past 24 hour(s))  CBC     Status: Abnormal   Collection Time: 03/17/17  5:04 AM  Result Value Ref Range   WBC 6.3 4.0 - 10.5 K/uL   RBC 3.85 (L) 3.87 - 5.11 MIL/uL   Hemoglobin 10.5 (L) 12.0 - 15.0 g/dL   HCT 95.6 (L) 21.3 - 08.6 %   MCV 83.6 78.0 - 100.0 fL   MCH 27.3 26.0 - 34.0 pg   MCHC 32.6 30.0 - 36.0 g/dL   RDW 57.8 46.9 - 62.9 %   Platelets 232 150 - 400 K/uL      Imaging Results (Last 48 hours)  Ct Angio Head W Or Wo Contrast  Result Date: 03/15/2017 CLINICAL DATA:  LEFT-sided weakness. History of cerebral infarction 3 weeks ago. MRI shows multiple vascular territories involved. EXAM: CT ANGIOGRAPHY HEAD AND NECK TECHNIQUE: Multidetector CT imaging of the head and neck was performed using the standard protocol during bolus administration of intravenous contrast. Multiplanar CT image reconstructions and MIPs were obtained to evaluate the vascular anatomy. Carotid stenosis measurements (when applicable) are obtained utilizing NASCET criteria, using the distal internal carotid diameter as the denominator. CONTRAST:  Isovue 370, 50 mL. COMPARISON:  MR brain 03/14/2017.  CT head 03/14/2017. FINDINGS: CT HEAD Calvarium and skull base: No fracture or destructive lesion. Mastoids and middle ears are grossly clear. Paranasal sinuses: Imaged portions are clear. Orbits: Negative. Brain: The multifocal RIGHT frontal, LEFT occipital, and RIGHT thalamus subacute infarcts are better seen on MR. There is global atrophy with chronic microvascular ischemic change. No hemorrhage is present. Chronic LEFT cerebellar infarct redemonstrated. CTA NECK Aortic arch: Standard branching. Imaged portion shows no evidence of aneurysm or dissection. No significant stenosis of the major arch vessel origins although widespread calcifications  present along with some soft plaque involving not only the vessel origins both transverse arch.  Right carotid system: Advanced dolichoectasia. Predominantly calcific plaque along the distal common carotid artery, and at the bifurcation but no measurable stenosis. No evidence of dissection, stenosis (50% or greater) or occlusion. Left carotid system: Advanced dolichoectasia. Predominantly calcific plaque along the distal common carotid artery and at the bifurcation, but no measurable stenosis. No evidence of dissection, stenosis (50% or greater) or occlusion. Vertebral arteries: RIGHT vertebral is dominant/patent from its origin to the basilar confluence. LEFT vertebral is occluded just above its origin, and is not patent in the neck. Nonvascular soft tissues: Cervical spondylosis. No lung lesions. Normal thyroid. CTA HEAD Anterior circulation: Calcific plaque in both cavernous carotid arteries. Possible 50% stenosis on the LEFT. No proximal anterior or middle cerebral artery stenosis is evident. No MCA branch occlusion. Both distal anterior cerebral arteries patent. No aneurysm, or vascular malformation. Posterior circulation: The basilar artery is diffusely small, which likely relates to hypoplasia from BILATERAL fetal PCA origins. The basilar is also mildly irregular, and superimposed atherosclerotic disease is suspected. The RIGHT PCA is widely patent. The LEFT PCA is severely diseased proximally, and does not significantly fill in its P2 or P3 segments. No visible cerebellar branch occlusion. Probable retrograde flow in the LEFT vertebral to supply the PICA. No aneurysm, or vascular malformation. Venous sinuses: As permitted by contrast timing, patent. Anatomic variants: BILATERAL fetal PCA. Delayed phase: Gyriform enhancement of the medial/superior subacute RIGHT frontal infarct reflects its subacute nature. Similar more subtle findings in the LEFT occipital cortex. Review of the MIP images confirms the above findings IMPRESSION: No significant carotid stenosis or proximal anterior circulation disease.  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 LEFT PCA occlusion. Subacute RIGHT frontal infarct shows mild postcontrast enhancement consistent with an insult a few weeks ago. Electronically Signed   By: Elsie Stain M.D.   On: 03/15/2017 13:59   Ct Angio Neck W Or Wo Contrast  Result Date: 03/15/2017 CLINICAL DATA:  LEFT-sided weakness. History of cerebral infarction 3 weeks ago. MRI shows multiple vascular territories involved. EXAM: CT ANGIOGRAPHY HEAD AND NECK TECHNIQUE: Multidetector CT imaging of the head and neck was performed using the standard protocol during bolus administration of intravenous contrast. Multiplanar CT image reconstructions and MIPs were obtained to evaluate the vascular anatomy. Carotid stenosis measurements (when applicable) are obtained utilizing NASCET criteria, using the distal internal carotid diameter as the denominator. CONTRAST:  Isovue 370, 50 mL. COMPARISON:  MR brain 03/14/2017.  CT head 03/14/2017. FINDINGS: CT HEAD Calvarium and skull base: No fracture or destructive lesion. Mastoids and middle ears are grossly clear. Paranasal sinuses: Imaged portions are clear. Orbits: Negative. Brain: The multifocal RIGHT frontal, LEFT occipital, and RIGHT thalamus subacute infarcts are better seen on MR. There is global atrophy with chronic microvascular ischemic change. No hemorrhage is present. Chronic LEFT cerebellar infarct redemonstrated. CTA NECK Aortic arch: Standard branching. Imaged portion shows no evidence of aneurysm or dissection. No significant stenosis of the major arch vessel origins although widespread calcifications present along with some soft plaque involving not only the vessel origins both transverse arch. Right carotid system: Advanced dolichoectasia. Predominantly calcific  plaque along the distal common carotid artery, and at the bifurcation but no measurable stenosis. No evidence of dissection, stenosis (50% or greater) or occlusion. Left carotid system:  Advanced dolichoectasia. Predominantly calcific plaque along the distal common carotid artery and at the bifurcation, but no measurable stenosis. No evidence of dissection, stenosis (50% or greater) or occlusion. Vertebral arteries: RIGHT vertebral is dominant/patent from its origin to the basilar confluence. LEFT vertebral is occluded just above its origin, and is not patent in the neck. Nonvascular soft tissues: Cervical spondylosis. No lung lesions. Normal thyroid. CTA HEAD Anterior circulation: Calcific plaque in both cavernous carotid arteries. Possible 50% stenosis on the LEFT. No proximal anterior or middle cerebral artery stenosis is evident. No MCA branch occlusion. Both distal anterior cerebral arteries patent. No aneurysm, or vascular malformation. Posterior circulation: The basilar artery is diffusely small, which likely relates to hypoplasia from BILATERAL fetal PCA origins. The basilar is also mildly irregular, and superimposed atherosclerotic disease is suspected. The RIGHT PCA is widely patent. The LEFT PCA is severely diseased proximally, and does not significantly fill in its P2 or P3 segments. No visible cerebellar branch occlusion. Probable retrograde flow in the LEFT vertebral to supply the PICA. No aneurysm, or vascular malformation. Venous sinuses: As permitted by contrast timing, patent. Anatomic variants: BILATERAL fetal PCA. Delayed phase: Gyriform enhancement of the medial/superior subacute RIGHT frontal infarct reflects its subacute nature. Similar more subtle findings in the LEFT occipital cortex. Review of the MIP images confirms the above findings IMPRESSION: No significant carotid stenosis or proximal anterior circulation disease. 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 LEFT PCA occlusion. Subacute RIGHT frontal infarct shows mild postcontrast enhancement consistent with an insult a few weeks ago. Electronically Signed   By: Elsie Stain M.D.   On: 03/15/2017 13:59   Dg Knee Complete 4 Views Left  Result Date: 03/16/2017 CLINICAL DATA:  Patient with persistent left knee pain for 6 months. EXAM: LEFT KNEE - COMPLETE 4+ VIEW COMPARISON:  None. FINDINGS: Normal anatomic alignment. No evidence for acute fracture dislocation. Medial compartment joint space narrowing and osteophytosis. Vascular calcifications. IMPRESSION: No acute osseous abnormality. Degenerative changes, most pronounced within the medial compartment. Electronically Signed   By: Annia Belt M.D.   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 Does the need for close, 24 hr/day medical supervision in concert with the patient's rehab needs make it unreasonable for this patient to be served in a less intensive setting? Yes 2. Co-Morbidities requiring supervision/potential complications: HTN, post-stroke sequelae 3. Due to bladder management, bowel management, safety, skin/wound care, disease management, medication administration, pain management and patient education, does the patient require 24 hr/day rehab nursing? Yes 4. Does the patient require coordinated care of a physician, rehab nurse, PT (1-2 hrs/day, 5 days/week), OT (1-2 hrs/day, 5 days/week) and SLP (1-2 hrs/day, 5 days/week) to address physical and functional deficits in the context of the above medical diagnosis(es)? Yes Addressing deficits in the following areas: balance, endurance, locomotion, strength, transferring, bowel/bladder control, bathing, dressing, feeding, grooming, toileting,  cognition and psychosocial support 5. Can the patient actively participate in an intensive therapy program of at least 3 hrs of therapy per day at least 5 days per week? Yes 6. The potential for patient to make measurable gains while on inpatient rehab is excellent 7. Anticipated functional outcomes upon discharge from inpatient rehab are modified independent and supervision  with PT, modified independent and supervision with OT, modified independent  and supervision with SLP. 8. Estimated rehab length of stay to reach the above functional goals is: 9-14 days 9. Does the patient have adequate social supports and living environment to accommodate these discharge functional goals? Yes and Potentially 10. Anticipated D/C setting: Home 11. Anticipated post D/C treatments: HH therapy and Outpatient therapy 12. Overall Rehab/Functional Prognosis: excellent  RECOMMENDATIONS: This patient's condition is appropriate for continued rehabilitative care in the following setting: CIR Patient has agreed to participate in recommended program. Yes Note that insurance prior authorization may be required for reimbursement for recommended care.  Comment: Rehab Admissions Coordinator to follow up.  Thanks,  Ranelle Oyster, MD, [place] [facility]    Charlton Amor., PA-C 03/17/2017    Revision History                        Routing History

## 2017-03-20 NOTE — Progress Notes (Signed)
Nutrition Brief Note  Patient identified on the Malnutrition Screening Tool (MST) Report  Wt Readings from Last 15 Encounters:  03/14/17 138 lb 14.2 oz (63 kg)   Current diet order is heart healthy diet, patient is consuming approximately 75% of meals at this time. Pt reports appetite is fine currently and PTA with no other difficulties. No abdominal pains noted and food has been fine at meals. Usual body weight reported to be ~135 lbs. Weight has been stable. Pt currently has Ensure ordered and has been consuming them. RD to continue with current orders. Labs and medications reviewed.   No further nutrition interventions warranted at this time. If nutrition issues arise, please consult RD.   Roslyn SmilingStephanie Erva Koke, MS, RD, LDN Pager # [phone] After hours/ weekend pager # [phone]

## 2017-03-20 NOTE — Care Management Note (Signed)
Inpatient Rehabilitation Center Individual Statement of Services  Patient Name:  Christina [name]  Date:  03/20/2017  Welcome to the Inpatient Rehabilitation Center.  Our goal is to provide you with an individualized program based on your diagnosis and situation, designed to meet your specific needs.  With this comprehensive rehabilitation program, you will be expected to participate in at least 3 hours of rehabilitation therapies Monday-Friday, with modified therapy programming on the weekends.  Your rehabilitation program will include the following services:  Physical Therapy (PT), Occupational Therapy (OT), Speech Therapy (ST), 24 hour per day rehabilitation nursing, Therapeutic Recreaction (TR), Neuropsychology, Case Management (Social Worker), Rehabilitation Medicine, Nutrition Services and Pharmacy Services  Weekly team conferences will be held on Wednesday to discuss your progress.  Your Social Worker will talk with you frequently to get your input and to update you on team discussions.  Team conferences with you and your family in attendance may also be held.  Expected length of stay: 21-23 days  Overall anticipated outcome: supervision-cueing  Depending on your progress and recovery, your program may change. Your Social Worker will coordinate services and will keep you informed of any changes. Your Social Worker's name and contact numbers are listed  below.  The following services may also be recommended but are not provided by the Inpatient Rehabilitation Center:    Home Health Rehabiltiation Services  Outpatient Rehabilitation Services    Arrangements will be made to provide these services after discharge if needed.  Arrangements include referral to agencies that provide these services.  Your insurance has been verified to be:  Self Pay Your primary doctor is:  None  Pertinent information will be shared with your doctor and your insurance company.  Social Worker:  Dossie DerBecky Mena Simonis,  SW [phone] or [phone]) [phone]  Information discussed with and copy given to patient by: [name], Shazia Mitchener G, 03/20/2017, 11:20 AM

## 2017-03-20 NOTE — Progress Notes (Signed)
Trish Mage, RN Rehab Admission Coordinator Signed Physical Medicine and Rehabilitation  PMR Pre-admission Date of Service: 03/19/2017 9:58 AM  Related encounter: ED to Hosp-Admission (Discharged) from 03/14/2017 in [facility] 51M NEURO MEDICAL       [] Hide copied text PMR Admission Coordinator Pre-Admission Assessment  Patient: Christina [name] is an 71 y.o., female MRN: [id number] DOB: 1946-06-21 Height: 5\' 2"  (157.5 cm) Weight: 63 kg (138 lb 14.2 oz)                                                                                                                                                  Insurance Information Self pay - no insurance (had medicaid in [place]  [place] Application Date:        Case Manager:   Disability Application Date:        Case Worker:    Emergency Actuary Information    Name Relation Home Work Mobile   Hidalgo,[name] Daughter [phone]       Current Medical History  Patient Admitting Diagnosis:  B CVA  History of Present Illness: A 71 y.o.right handed femalewith history of hypertension, prior tobacco abuse and recently diagnosed with CVA about 3 weeks ago while in [place] with left-sided weakness and placed on aspirin and Plavix for CVA prophylaxis. She was later discharged to home but by report of family unable to ambulate. She was subsequently brought to the [place] by her daughter for further management and presented to [facility] 03/14/2017.Per chart review patient is from [place] had been living in [place] for the past 30 years alone and was independent. She has family in [place] can assist as needed.A CT of the head showed cerebral atrophy, ventriculomegaly and periventricular white matter disease. Suspect lacunar type left basal ganglia infarct. MRI of the brain showed multiple subacute infarcts in multiple vascular territories suggesting embolic disease from the heart  or ascending aorta. There was involvement of the right thalamus, left parieto-occipital junction region and right frontal paramedian brain. CT angiogram head and neck showed no significant carotid stenosis or proximal anterior circulation disease. 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 Left PCA occlusion. Venous Dopplers lower extremities showed superficial thrombosis noted in the left lesser saphenous vein and in the varicosities of the Left calf. Echocardiogram with ejection fraction of 60% grade 1 diastolic dysfunction. Patient remains on aspirin and Plavix for CVA prophylaxisHowever telemetry showed bouts of atrial fibrillation and was changed to Eliquis. No plan for TEE after telemetry monitored indicate atrial fibrillation.Marland Kitchen Physical and occupational therapy evaluations completed with recommendations of physical medicine rehabilitation consult.  Patient to be admitted for a comprehensive inpatient rehabilitation program.   Total: 5=NIH  Past Medical History  Past Medical History:  Diagnosis Date  . Hypertension   . Stroke [facility])     Family History  family history includes Diabetes in her mother; Heart disease in her father, mother, sister, and sister.  Prior Rehab/Hospitalizations: No previous rehab.  Has the patient had major surgery during 100 days prior to admission? No  Current Medications   Current Facility-Administered Medications:  .  acetaminophen (TYLENOL) tablet 650 mg, 650 mg, Oral, Q4H PRN, 650 mg at 03/18/17 1442 **OR** acetaminophen (TYLENOL) solution 650 mg, 650 mg, Per Tube, Q4H PRN **OR** acetaminophen (TYLENOL) suppository 650 mg, 650 mg, Rectal, Q4H PRN, Costin Otelia Sergeant, MD .  apixaban (ELIQUIS) tablet 5 mg, 5 mg, Oral, BID, Richarda Overlie, MD .  carvedilol (COREG) tablet 12.5 mg, 12.5 mg, Oral, BID WC, Elease Etienne, MD, 12.5 mg at 03/19/17 1610 .  labetalol  (NORMODYNE,TRANDATE) injection 10 mg, 10 mg, Intravenous, Q2H PRN, Leatha Gilding, MD, 10 mg at 03/16/17 2037 .  latanoprost (XALATAN) 0.005 % ophthalmic solution 1 drop, 1 drop, Both Eyes, BH-q7a, Leatha Gilding, MD, 1 drop at 03/19/17 0604 .  losartan (COZAAR) tablet 100 mg, 100 mg, Oral, Daily, Richarda Overlie, MD, 100 mg at 03/19/17 0938 .  senna-docusate (Senokot-S) tablet 1 tablet, 1 tablet, Oral, QHS PRN, Leatha Gilding, MD .  simvastatin (ZOCOR) tablet 20 mg, 20 mg, Oral, Daily, Leatha Gilding, MD, 20 mg at 03/19/17 9604 .  temazepam (RESTORIL) capsule 15 mg, 15 mg, Oral, QHS, Leatha Gilding, MD, 15 mg at 03/18/17 2133 .  verapamil (CALAN-SR) CR tablet 240 mg, 240 mg, Oral, QHS, Elease Etienne, MD, 240 mg at 03/18/17 2134 .  zolpidem (AMBIEN) tablet 5 mg, 5 mg, Oral, QHS PRN, Leda Gauze, NP, 5 mg at 03/18/17 2133  Patients Current Diet: Diet - low sodium heart healthy Diet Heart Room service appropriate? Yes; Fluid consistency: Thin  Precautions / Restrictions Precautions Precautions: Fall Precaution Comments: Inconsistent impairments throughout session. Restrictions Weight Bearing Restrictions: No   Has the patient had 2 or more falls or a fall with injury in the past year?Yes.  Reports at least 3 falls where she hit her head and her bottom.  Prior Activity Level Community (5-7x/wk): Went out daily, very active, walked a lot, not driving.  Was in [place]  Home Assistive Devices / Equipment Home Assistive Devices/Equipment: None Home Equipment: Wheelchair - manual  Prior Device Use: Indicate devices/aids used by the patient prior to current illness, exacerbation or injury? None  Prior Functional Level Prior Function Level of Independence: Needs assistance Gait / Transfers Assistance Needed: Daughter reports that she has been doing ~75% of the work when assisting her. Could take a few steps from bed to w/c and then from w/c to toilet.  ADL's /  Homemaking Assistance Needed: Pt able to assist minimally with sponge bathing and self-care but daughter reports she has been doing most of it for her since her stroke 3 weeks ago  Self Care: Did the patient need help bathing, dressing, using the toilet or eating?  Independent  Indoor Mobility: Did the patient need assistance with walking from room to room (with or without device)? Independent  Stairs: Did the patient need assistance with internal or external stairs (with or without device)? Independent  Functional Cognition: Did the patient need help planning regular tasks such as shopping or remembering to take medications? Independent  Current Functional Level Cognition  Arousal/Alertness: Awake/alert Overall Cognitive Status: Impaired/Different from baseline Current  Attention Level: Selective Orientation Level: Oriented X4 Following Commands: Follows one step commands with increased time, Follows one step commands inconsistently Safety/Judgement: Decreased awareness of safety, Decreased awareness of deficits General Comments: Multimodal cues required for follow through of tasks. Easily distracted by family in room.  Attention: Focused, Sustained, Selective Focused Attention: Impaired Focused Attention Impairment: Verbal basic, Functional basic Sustained Attention: Impaired Sustained Attention Impairment: Verbal basic, Functional basic Selective Attention: Impaired Selective Attention Impairment: Verbal basic, Functional basic Memory: Impaired Memory Impairment: Decreased short term memory, Storage deficit, Decreased recall of new information Decreased Short Term Memory: Verbal basic, Functional basic Awareness: Impaired Awareness Impairment: Intellectual impairment Problem Solving: Impaired Problem Solving Impairment: Verbal basic, Functional basic Executive Function: Sequencing, Initiating Sequencing: Impaired Sequencing Impairment: Verbal basic, Functional  basic Initiating: Impaired Initiating Impairment: Verbal basic, Functional basic Safety/Judgment: Impaired    Extremity Assessment (includes Sensation/Coordination)  Upper Extremity Assessment: LUE deficits/detail, Generalized weakness LUE Deficits / Details: Pt "unable" to reach full elbow extension, but able to hold 90 degrees forward flexion from elbow for 10 seconds with eyes closed. Pt with good grip strength and able to resist and pull at elbow flexion LUE Sensation:  (WFL per Pt report)  Lower Extremity Assessment: Defer to PT evaluation LLE Deficits / Details: Pt reports she is not able to move the LLE "at all", however demonstrated some active ankle DF/PF, and knee flexion in supine. In sitting, pt able to somewhat extend the L knee (very inconsistent with how much she is able to do actively). Unclear whether pain in the L knee was limiting her or if it was weakness.    ADLs  Overall ADL's : Needs assistance/impaired Eating/Feeding: Set up, Sitting Grooming: Moderate assistance, Standing (+2 assist for posture and standing balance) Grooming Details (indicate cue type and reason): Pt able to brush her teeth with maximum verbal and tactile for posture along with hands on facilitation.  Upper Body Bathing: Moderate assistance Lower Body Bathing: Maximal assistance Upper Body Dressing : Minimal assistance Lower Body Dressing: Moderate assistance, Sitting/lateral leans Lower Body Dressing Details (indicate cue type and reason): Donned R sock in sitting with multimodal cues and required mod assist for L LE. Toilet Transfer: Maximal assistance, +2 for physical assistance, +2 for safety/equipment, BSC, RW, Stand-pivot Functional mobility during ADLs: +2 for physical assistance, Moderate assistance General ADL Comments: Pt stood at sink for grooming tasks with mirror biofeedback, moderate assistance, and hands on facilitation of functional posture. Facilitated L UE weight bearing during  functional reaching tasks during ADL.  Pt required multimodal cues for attention to task.    Mobility  Overal bed mobility: Needs Assistance Bed Mobility: Sidelying to Sit, Rolling, Sit to Supine Rolling: Min assist Sidelying to sit: Mod assist Sit to supine: Max assist Sit to sidelying: Mod assist General bed mobility comments: Hand-over-hand assist for reaching RUE to L rail. Pt was able to assist more in transition to sitting this session, with max assist provided for return to supine. Pt with overall difficulty with sequencing and increased time required to complete bed mobility.     Transfers  Overall transfer level: Needs assistance Equipment used: Rolling walker (2 wheeled) Transfers: Sit to/from Stand Sit to Stand: Mod assist, +2 physical assistance Stand pivot transfers: Mod assist, Max assist, +2 physical assistance General transfer comment: Increased assist to power-up to full stand in the room in front of family. When pt was alone with therapists, +2 mod assist was provided and pt assisted more in transfer. Increased time  and frequent cues required for hand placement on seated surface for safety.     Ambulation / Gait / Stairs / Wheelchair Mobility  Ambulation/Gait Ambulation/Gait assistance: Max assist, +2 physical assistance Ambulation Distance (Feet): 7 Feet (4', 3') Assistive device: Rolling walker (2 wheeled) Gait Pattern/deviations: Step-to pattern, Decreased stance time - left, Decreased step length - left (Heavy L lean) General Gait Details: Mirror in front of patient for visual feedback of posture and sequencing. Pt required step-by-step cues of improving posture, leaning R, stepping L, stepping R with each attempt. Therapist provided assist to advance LLE. One seated rest break required. Gait velocity: Decreased Gait velocity interpretation: Below normal speed for age/gender    Posture / Balance Dynamic Sitting Balance Sitting balance - Comments: L lateral  lean and poor postural control. Balance Overall balance assessment: Needs assistance Sitting-balance support: Bilateral upper extremity supported, Feet supported Sitting balance-Leahy Scale: Poor Sitting balance - Comments: L lateral lean and poor postural control. Postural control: Left lateral lean Standing balance support: Bilateral upper extremity supported Standing balance-Leahy Scale: Zero Standing balance comment: L lateral lean requiring mod assist +2 with hands on facilitation along with mirror biofeedback to correct.     Special needs/care consideration BiPAP/CPAP No CPM No Continuous Drip IV No Dialysis No        Life Vest No Oxygen No Special Bed No Trach Size No Wound Vac (area) No  Skin Healing bedsore on coccyx                           Bowel mgmt: Last BM 03/18/17 per report of daughter Bladder mgmt: Using BSC, some incontinence Diabetic mgmt No    Previous Home Environment Living Arrangements: Children  Lives With: Daughter Available Help at Discharge: Family, Available PRN/intermittently Type of Home: House Home Layout: One level Home Access: Level entry Bathroom Shower/Tub: Engineer, manufacturing systems: Standard Bathroom Accessibility: Yes How Accessible: Accessible via walker Home Care Services: No  Discharge Living Setting Plans for Discharge Living Setting: House, Lives with (comment) (Lives with daughter.) Type of Home at Discharge: House Discharge Home Layout: One level Discharge Home Access: Level entry Does the patient have any problems obtaining your medications?: Yes (Describe) (Has no insurance.)  Social/Family/Support Systems Patient Roles: Parent (Has 2 sons, 1 daughter.) Contact Information: [place] Mam Anticipated Caregiver: Dtr and family Anticipated Caregiver's Contact Information: Aram Beecham - dtr [phone] Ability/Limitations of Caregiver: Dtr works 02-1129 pm.  Son in Social worker is a Naval architect. Caregiver Availability:  Other (Comment) (Dtr aware of need for supervision after rehab discharge.) Discharge Plan Discussed with Primary Caregiver: Yes Is Caregiver In Agreement with Plan?: Yes Does Caregiver/Family have Issues with Lodging/Transportation while Pt is in Rehab?: No  Goals/Additional Needs Patient/Family Goal for Rehab: PT/OT/SLP mod I and supervision goals Expected length of stay: 9-14 days Cultural Considerations: Catholic, from [place] recently.  Speaks English. Dietary Needs: Heart diet, thin liquids Equipment Needs: TBD Pt/Family Agrees to Admission and willing to participate: Yes Program Orientation Provided & Reviewed with Pt/Caregiver Including Roles  & Responsibilities: Yes  Decrease burden of Care through IP rehab admission: N/A  Possible need for SNF placement upon discharge: Not planned  Patient Condition: This patient's medical and functional status has changed since the consult dated: 03/17/17 in which the Rehabilitation Physician determined and documented that the patient's condition is appropriate for intensive rehabilitative care in an inpatient rehabilitation facility. See "History of Present Illness" (above) for medical update.  Functional changes are: Currently requiring mod/max assist for transfers and max assist to ambulate 7 feet RW. Patient's medical and functional status update has been discussed with the Rehabilitation physician and patient remains appropriate for inpatient rehabilitation. Will admit to inpatient rehab today.  Preadmission Screen Completed By:  Trish Mage, 03/19/2017 10:07 AM ______________________________________________________________________   Discussed status with Dr. Wynn Banker on 03/19/17 at 1006 and received telephone approval for admission today.  Admission Coordinator:  Trish Mage, time 1007/Date 03/19/17       Cosigned by: Erick Colace, MD at 03/19/2017 11:54 AM  Revision History

## 2017-03-20 NOTE — Progress Notes (Signed)
Social Work Patient ID: Christina NegusArinda [name], female   DOB: [birth year], 71 y.o.   MRN: [id number]  Awaiting daughter's return to complete psychosocial assessment. Pt will let daughter know when here I need to see her. She didn'twant to eat lunch too tired and doesn't want her lunch. Will ask dietary to see and get her preferences.

## 2017-03-20 NOTE — Evaluation (Signed)
Speech Language Pathology Assessment and Plan  Patient Details  Name: Christina [name] MRN: [id number] Date of Birth: [birth year]  SLP Diagnosis: Cognitive Impairments  Rehab Potential: Good ELOS: 21-23 days     Today's Date: 03/20/2017 SLP Individual Time: 1300-1400 SLP Individual Time Calculation (min): 60 min   Problem List:  Patient Active Problem List   Diagnosis Date Noted  . 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 Left hemiparesis ([place])   . Primary osteoarthritis of left knee   . Left knee pain   . Diplopia   . Acute cystitis without hematuria   . Stroke ([place])   . TIA (transient ischemic attack) 03/14/2017  . History of CVA (cerebrovascular accident) 03/14/2017  . HTN (hypertension) 03/14/2017  . HLD (hyperlipidemia) 03/14/2017  . 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 Hypertension   . Stroke [facility])    Past Surgical History:  Past Surgical History:  Procedure Laterality Date  . ABDOMINAL HYSTERECTOMY    . EYE SURGERY      Assessment / Plan / Recommendation Clinical Impression  Patient is a 71 y.o. right handed female with history of hypertension, prior tobacco abuse and recently diagnosed with CVA about 3 weeks ago while in [place] with left-sided weakness and placed on aspirin and Plavix for CVA prophylaxis. She was later discharged to home but by report of family unable to ambulate. She was subsequently brought to the [place] by her daughter for further management and presented to [facility] 03/14/2017. Per chart review patient is from [place] had been living in [place] for the past 30 years alone and was independent. She has family in [place] who can assist as needed. A CT of the head showed cerebral atrophy, ventriculomegaly and periventricular white matter disease. Suspect lacunar type left basal ganglia infarct. MRI of the brain showed multiple subacute infarcts in multiple vascular  territories suggesting embolic disease from the heart or ascending aorta. There was involvement of the right thalamus, left parieto-occipital junction region and right frontal paramedian brain. CT angiogram head and neck showed no significant carotid stenosis or proximal anterior circulation disease. There was noted occluded left vertebral throughout much of its course likely chronic given the old left cerebellar infarct. Left PCA occlusion. Venous Dopplers lower extremities showed superficial thrombosis noted in the left lesser saphenous vein and in the varicosities of the Left calf. Echocardiogram with ejection fraction of 38% grade 1 diastolic dysfunction.  Patient remains on aspirin and Plavix for CVA prophylaxisHowever telemetry showed bouts of atrial fibrillation and was changed to Eliquis. No plan for TEE after telemetry monitored indicate atrial fibrillation.Marland Kitchen Physical and occupational therapy evaluations completed with recommendations of physical medicine rehabilitation consult.Patient was admitted for a comprehensive rehabilitation program on 03/19/17.  Patient demonstrates severe cognitive impairments impacting orientation, initiation, sustained attention, functional problem solving, intellectual awareness, recall of new information and overall safety with functional and familiar tasks. Patient also demonstrated difficulty following directions, difficult to differentiate possible language barrier verses cognition vs possible motor apraxia. Patient would benefit from skilled SLP intervention to maximize her cognitive function and overall functional independence prior to discharge.    Skilled Therapeutic Interventions          Administered a cognitive-linguistic evaluation. Please see above for details. Patient independently requested to use the bathroom and required Max A verbal cues and extra time for initiation and problem solving with task. Patient was oriented to self but  required total A for  orientation to place, time and situation. External aids were created for recall of information and patient utilized aids with supervision verbal cues. Patient left upright in bed with alarm on and all needs within reach. Continue with current plan of care.    SLP Assessment  Patient will need skilled [place] Pathology Services during CIR admission    Recommendations  Oral Care Recommendations: Oral care BID Recommendations for Other Services: Neuropsych consult Patient destination: Home Follow up Recommendations: Home Health SLP;24 hour supervision/assistance Equipment Recommended: None recommended by SLP    SLP Frequency 3 to 5 out of 7 days   SLP Duration  SLP Intensity  SLP Treatment/Interventions 21-23 days   Minumum of 1-2 x/day, 30 to 90 minutes  Cognitive remediation/compensation;Cueing hierarchy;Environmental controls;Therapeutic Activities;Internal/external aids;Patient/family education    Pain Pain Assessment Pain Assessment: 0-10 Pain Score: 0-No pain Pain Type: Acute pain Pain Location: Back Pain Orientation: Mid Pain Descriptors / Indicators: Aching Pain Onset: On-going Patients Stated Pain Goal: 0 Pain Intervention(s): Medication (See eMAR)  Prior Functioning Type of Home: House  Lives With: Daughter Available Help at Discharge: Family;Available PRN/intermittently Vocation: Retired  Function:  Eating Eating   Modified Consistency Diet: No Eating Assist Level: More than reasonable amount of time           Cognition Comprehension Comprehension assist level: Understands basic 25 - 49% of the time/ requires cueing 50 - 75% of the time  Expression   Expression assist level: Expresses basic 50 - 74% of the time/requires cueing 25 - 49% of the time. Needs to repeat parts of sentences.  Social Interaction Social Interaction assist level: Interacts appropriately 75 - 89% of the time - Needs redirection for appropriate language or to initiate  interaction.  Problem Solving Problem solving assist level: Solves basic less than 25% of the time - needs direction nearly all the time or does not effectively solve problems and may need a restraint for safety  Memory Memory assist level: Recognizes or recalls less than 25% of the time/requires cueing greater than 75% of the time   Short Term Goals: Week 1: SLP Short Term Goal 1 (Week 1): Patient will utilize external aids for orientation to place, time and siutation with Min A verbal and visual cues.  SLP Short Term Goal 2 (Week 1): Patient will demonstrate sustained attention to a functional task for 15 minutes with supervision verbal cues.  SLP Short Term Goal 3 (Week 1): Patient will demonstrate functional problem solving for basic and familiar tasks with Mod A multimodal cues.  SLP Short Term Goal 4 (Week 1): Patient will identify 2 physical and 2 cognitive changes with Mod A verbal cues.   Refer to Care Plan for Long Term Goals  Recommendations for other services: Neuropsych  Discharge Criteria: Patient will be discharged from SLP if patient refuses treatment 3 consecutive times without medical reason, if treatment goals not met, if there is a change in medical status, if patient makes no progress towards goals or if patient is discharged from hospital.  The above assessment, treatment plan, treatment alternatives and goals were discussed and mutually agreed upon: by patient  Abbagale Goguen 03/20/2017, 3:39 PM

## 2017-03-20 NOTE — Discharge Instructions (Addendum)
Inpatient Rehab Discharge Instructions  [name] Discharge date and time: No discharge date for patient encounter.   Activities/Precautions/ Functional Status: Activity: activity as tolerated Diet: regular diet Wound Care: none needed Functional status:  ___ No restrictions     ___ Walk up steps independently ___ 24/7 supervision/assistance   ___ Walk up steps with assistance ___ Intermittent supervision/assistance  ___ Bathe/dress independently ___ Walk with walker     _x STROKE/TIA DISCHARGE INSTRUCTIONS SMOKING Cigarette smoking nearly doubles your risk of having a stroke & is the single most alterable risk factor  If you smoke or have smoked in the last 12 months, you are advised to quit smoking for your health.  Most of the excess cardiovascular risk related to smoking disappears within a year of stopping.  Ask you doctor about anti-smoking medications  [place] Quit Line: 1-800-QUIT NOW  Free Smoking Cessation Classes [phone]  CHOLESTEROL Know your levels; limit fat & cholesterol in your diet  Lipid Panel     Component Value Date/Time   CHOL 131 03/15/2017 0725   TRIG 175 (H) 03/15/2017 0725   HDL 34 (L) 03/15/2017 0725   CHOLHDL 3.9 03/15/2017 0725   VLDL 35 03/15/2017 0725   LDLCALC 62 03/15/2017 0725      Many patients benefit from treatment even if their cholesterol is at goal.  Goal: Total Cholesterol (CHOL) less than 160  Goal:  Triglycerides (TRIG) less than 150  Goal:  HDL greater than 40  Goal:  LDL (LDLCALC) less than 100   BLOOD PRESSURE American Stroke Association blood pressure target is less that 120/80 mm/Hg  Your discharge blood pressure is:  BP: (!) 141/60  Monitor your blood pressure  Limit your salt and alcohol intake  Many individuals will require more than one medication for high blood pressure  DIABETES (A1c is a blood sugar average for last 3 months) Goal HGBA1c is under 7% (HBGA1c is blood sugar average for last 3 months)    Diabetes: No known diagnosis of diabetes    Lab Results  Component Value Date   HGBA1C 5.7 (H) 03/15/2017     Your HGBA1c can be lowered with medications, healthy diet, and exercise.  Check your blood sugar as directed by your physician  Call your physician if you experience unexplained or low blood sugars.  PHYSICAL ACTIVITY/REHABILITATION Goal is 30 minutes at least 4 days per week  Activity: Increase activity slowly, Therapies: Physical Therapy: Home Health Return to work:   Activity decreases your risk of heart attack and stroke and makes your heart stronger.  It helps control your weight and blood pressure; helps you relax and can improve your mood.  Participate in a regular exercise program.  Talk with your doctor about the best form of exercise for you (dancing, walking, swimming, cycling).  DIET/WEIGHT Goal is to maintain a healthy weight  Your discharge diet is: Diet Heart Room service appropriate? Yes; Fluid consistency: Thin  liquids Your height is:    Your current weight is: Weight: 65.2 kg (143 lb 13.6 oz) Your Body Mass Index (BMI) is:     Following the type of diet specifically designed for you will help prevent another stroke.  Your goal weight range is:    Your goal Body Mass Index (BMI) is 19-24.  Healthy food habits can help reduce 3 risk factors for stroke:  High cholesterol, hypertension, and excess weight.  RESOURCES Stroke/Support Group:  Call [phone]   STROKE EDUCATION PROVIDED/REVIEWED AND GIVEN TO PATIENT  Stroke warning signs and symptoms How to activate emergency medical system (call 911). Medications prescribed at discharge. Need for follow-up after discharge. Personal risk factors for stroke. Pneumonia vaccine given:  Flu vaccine given:  My questions have been answered, the writing is legible, and I understand these instructions.  I will adhere to these goals & educational materials that have been provided to me after my discharge from  the hospital.   __ Bathe/dress with assistance ___ Walk Independently    ___ Shower independently ___ Walk with assistance    ___ Shower with assistance ___ No alcohol     ___ Return to work/school ________  Special Instructions:    COMMUNITY REFERRALS UPON DISCHARGE:    Home Health:   PT, OT, SP, RN    Agency:[facility] Phone:[phone]   Date of last service:04/05/2017  Medical Equipment/Items Ordered:TRANSPORT CHAIR, ROLLING Dan Humphreys, TUB BENCH, 3 IN1  Agency/Supplier:[facility]   [phone] Other:[facility] APPOINTMENT 4/17 @ 1:30 PM  GENERAL COMMUNITY RESOURCES FOR PATIENT/FAMILY: Support Groups:[facility] SUPPORT GROUP EVERY SECOND Thursday @ 3:00-4:00 PM ON THE REHAB UNIT QUESTIONS [place] [phone] Mental Health:FOLLOW UP WITH PSYCHIATRY  My questions have been answered and I understand these instructions. I will adhere to these goals and the provided educational materials after my discharge from the hospital.  Patient/Caregiver Signature _______________________________ Date __________  Clinician Signature _______________________________________ Date __________  Please bring this form and your medication list with you to all your follow-up doctor's appointments. Information on my medicine - ELIQUIS (apixaban)  This medication education was reviewed with me or my healthcare representative as part of my discharge preparation.    Why was Eliquis prescribed for you? Eliquis was prescribed for you to reduce the risk of forming blood clots that can cause a stroke if you have a medical condition called atrial fibrillation (a type of irregular heartbeat) OR to reduce the risk of a blood clots forming after orthopedic surgery.  What do You need to know about Eliquis ? Take your Eliquis 5mg  TWICE DAILY - one tablet in the morning and one tablet in the evening with or without food.  It would be best to take the doses about the same time each  day.  If you have difficulty swallowing the tablet whole please discuss with your pharmacist how to take the medication safely.  Take Eliquis exactly as prescribed by your doctor and DO NOT stop taking Eliquis without talking to the doctor who prescribed the medication.  Stopping may increase your risk of developing a new clot or stroke.  Refill your prescription before you run out.  After discharge, you should have regular check-up appointments with your healthcare provider that is prescribing your Eliquis.  In the future your dose may need to be changed if your kidney function or weight changes by a significant amount or as you get older.  What do you do if you miss a dose? If you miss a dose, take it as soon as you remember on the same day and resume taking twice daily.  Do not take more than one dose of ELIQUIS at the same time.  Important Safety Information A possible side effect of Eliquis is bleeding. You should call your healthcare provider right away if you experience any of the following: ? Bleeding from an injury or your nose that does not stop. ? Unusual colored urine (red or dark brown) or unusual colored stools (red or black). ? Unusual bruising for unknown reasons. ? A  serious fall or if you hit your head (even if there is no bleeding).  Some medicines may interact with Eliquis and might increase your risk of bleeding or clotting while on Eliquis. To help avoid this, consult your healthcare provider or pharmacist prior to using any new prescription or non-prescription medications, including herbals, vitamins, non-steroidal anti-inflammatory drugs (NSAIDs) and supplements.  This website has more information on Eliquis (apixaban): [email or web address].

## 2017-03-21 ENCOUNTER — Inpatient Hospital Stay (HOSPITAL_COMMUNITY): Payer: Self-pay | Admitting: Occupational Therapy

## 2017-03-21 ENCOUNTER — Inpatient Hospital Stay (HOSPITAL_COMMUNITY): Payer: Medicaid Other | Admitting: Speech Pathology

## 2017-03-21 ENCOUNTER — Inpatient Hospital Stay (HOSPITAL_COMMUNITY): Payer: Medicaid Other | Admitting: Physical Therapy

## 2017-03-21 NOTE — Progress Notes (Signed)
Social Work Patient ID: Christina [name], female   DOB: Sep 19, 1946, 71 y.o.   MRN: [id number]  Contacted Jasmin Woodard-financial counselor to inform she has PT therapy today when the [facility] application is scheduled. Informed her of her break from 11-3 and to see if the application could be moved to then. Did speak with her daughter and was able to complete psychosocial assessment.

## 2017-03-21 NOTE — Progress Notes (Signed)
Subjective/Complaints: No issues overnite just doesn't want to be in hospital ROS- no CP, SOB, N/V/D Objective: Vital Signs: Blood pressure (!) 161/56, pulse 63, temperature 98.4 F (36.9 C), temperature source Oral, resp. rate 18, SpO2 100 %. No results found. Results for orders placed or performed during the hospital encounter of 03/19/17 (from the past 72 hour(s))  CBC WITH DIFFERENTIAL     Status: Abnormal   Collection Time: 03/20/17  7:11 AM  Result Value Ref Range   WBC 5.5 4.0 - 10.5 K/uL   RBC 3.66 (L) 3.87 - 5.11 MIL/uL   Hemoglobin 9.9 (L) 12.0 - 15.0 g/dL   HCT 31.2 (L) 36.0 - 46.0 %   MCV 85.2 78.0 - 100.0 fL   MCH 27.0 26.0 - 34.0 pg   MCHC 31.7 30.0 - 36.0 g/dL   RDW 13.6 11.5 - 15.5 %   Platelets 203 150 - 400 K/uL   Neutrophils Relative % 65 %   Neutro Abs 3.6 1.7 - 7.7 K/uL   Lymphocytes Relative 18 %   Lymphs Abs 1.0 0.7 - 4.0 K/uL   Monocytes Relative 9 %   Monocytes Absolute 0.5 0.1 - 1.0 K/uL   Eosinophils Relative 7 %   Eosinophils Absolute 0.4 0.0 - 0.7 K/uL   Basophils Relative 1 %   Basophils Absolute 0.0 0.0 - 0.1 K/uL  Comprehensive metabolic panel     Status: Abnormal   Collection Time: 03/20/17  7:11 AM  Result Value Ref Range   Sodium 140 135 - 145 mmol/L   Potassium 4.0 3.5 - 5.1 mmol/L   Chloride 106 101 - 111 mmol/L   CO2 25 22 - 32 mmol/L   Glucose, Bld 106 (H) 65 - 99 mg/dL   BUN 19 6 - 20 mg/dL   Creatinine, Ser 1.15 (H) 0.44 - 1.00 mg/dL   Calcium 9.5 8.9 - 10.3 mg/dL   Total Protein 6.0 (L) 6.5 - 8.1 g/dL   Albumin 3.0 (L) 3.5 - 5.0 g/dL   AST 28 15 - 41 U/L   ALT 25 14 - 54 U/L   Alkaline Phosphatase 57 38 - 126 U/L   Total Bilirubin 0.7 0.3 - 1.2 mg/dL   GFR calc non Af Amer 47 (L) >60 mL/min   GFR calc Af Amer 55 (L) >60 mL/min    Comment: (NOTE) The eGFR has been calculated using the CKD EPI equation. This calculation has not been validated in all clinical situations. eGFR's persistently <60 mL/min signify possible Chronic  Kidney Disease.    Anion gap 9 5 - 15     HEENT: normal Cardio: RRR and no murmur Resp: CTA B/L and unlabored GI: BS positive and NT, ND Extremity:  No Edema Skin:   Intact Neuro: Alert/Oriented, Abnormal Motor 4- LUE, 3- LLE and Abnormal FMC Ataxic/ dec FMC Musc/Skel:  Normal and Other no pain with UE or LE  Gen NAD   Assessment/Plan: 1. Functional deficits secondary to Left MCA which require 3+ hours per day of interdisciplinary therapy in a comprehensive inpatient rehab setting. Physiatrist is providing close team supervision and 24 hour management of active medical problems listed below. Physiatrist and rehab team continue to assess barriers to discharge/monitor patient progress toward functional and medical goals. FIM: Function - Bathing Position: Shower Body parts bathed by patient: Right arm, Left arm, Chest, Abdomen, Front perineal area, Buttocks, Right upper leg, Left upper leg Body parts bathed by helper: Right lower leg, Left lower leg, Back  Function- Upper  Body Dressing/Undressing What is the patient wearing?: Pull over shirt/dress Pull over shirt/dress - Perfomed by patient: Pull shirt over trunk Pull over shirt/dress - Perfomed by helper: Thread/unthread right sleeve, Thread/unthread left sleeve, Put head through opening Function - Lower Body Dressing/Undressing What is the patient wearing?: Underwear, Pants, Non-skid slipper socks Position: Wheelchair/chair at sink Underwear - Performed by helper: Thread/unthread right underwear leg, Thread/unthread left underwear leg, Pull underwear up/down Pants- Performed by helper: Thread/unthread right pants leg, Thread/unthread left pants leg, Pull pants up/down Assist for footwear: Dependant  Function - Toileting Toileting steps completed by helper: Adjust clothing after toileting, Adjust clothing prior to toileting, Performs perineal hygiene Toileting Assistive Devices: Other (comment) Charlaine Dalton) Assist level: Two  helpers  Function - Air cabin crew transfer assistive device: Adult nurse, Elevated toilet seat/BSC over toilet Mechanical lift: Stedy Assist level to toilet: Maximal assist (Pt 25 - 49%/lift and lower) Assist level from toilet: Maximal assist (Pt 25 - 49%/lift and lower)  Function - Chair/bed transfer Chair/bed transfer method: Squat pivot Chair/bed transfer assist level: Total assist (Pt < 25%) Chair/bed transfer assistive device: Armrests  Function - Locomotion: Ambulation Assistive device: Hand held assist Max distance: 90 ft Assist level: 2 helpers Assist level: 2 helpers Assist level: 2 helpers Walk 150 feet activity did not occur: Safety/medical concerns Walk 10 feet on uneven surfaces activity did not occur: Safety/medical concerns  Function - Comprehension Comprehension: Auditory Comprehension assist level: Understands basic 25 - 49% of the time/ requires cueing 50 - 75% of the time  Function - Expression Expression: Verbal Expression assist level: Expresses basic 50 - 74% of the time/requires cueing 25 - 49% of the time. Needs to repeat parts of sentences.  Function - Social Interaction Social Interaction assist level: Interacts appropriately 75 - 89% of the time - Needs redirection for appropriate language or to initiate interaction.  Function - Problem Solving Problem solving assist level: Solves basic less than 25% of the time - needs direction nearly all the time or does not effectively solve problems and may need a restraint for safety  Function - Memory Memory assist level: Recognizes or recalls less than 25% of the time/requires cueing greater than 75% of the time Patient normally able to recall (first 3 days only): Current season, That he or she is in a hospital  Medical Problem List and Plan: 1.  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  DVT Prophylaxis/Anticoagulation: Eliquis 3. Pain Management:  Tylenol as needed 4. Mood/insomnia. Restoril 15 mg daily at bedtime 5. Neuropsych: This patient is capable of making decisions on her own behalf. 6. Skin/Wound Care: Routine skin checks 7. Fluids/Electrolytes/Nutrition: Routine I&O with follow-up chemistries 8. Hypertension. Coreg 12.5 mg twice a day, verapamil 240 mg daily at bedtime, Cozaar 100 mg daily. Monitor with increased mobility 9. Atrial fibrillation. Plan is currently to continue on Eliquis 10. Tobacco abuse. Counseling 11. Hyperlipidemia. Zocor  LOS (Days) 2 A FACE TO FACE EVALUATION WAS PERFORMED  KIRSTEINS,ANDREW E 03/21/2017, 7:01 AM
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
## 2017-03-21 NOTE — Progress Notes (Signed)
Occupational Therapy Session Note  Patient Details  Name: Christina [name] MRN: [id number] Date of Birth: 01/17/1946  Today's Date: 03/21/2017 OT Individual Time: 1000-1100 OT Individual Time Calculation (min): 60 min    Short Term Goals: Week 1:  OT Short Term Goal 1 (Week 1): Pt will demonstrate improved awareness of L side by donning shirt over LUE with min cues. OT Short Term Goal 2 (Week 1): Pt will demonstrate improved standing balance to stand with UE support with min A as she pulls pants over her hips. OT Short Term Goal 3 (Week 1): Pt will demonstrate improved praxis by completing a stand pivot transfer to her L with min to mod A.  OT Short Term Goal 4 (Week 1): Pt will be able to self cleanse post toileting with steadying assist and 2-3 verbal cues.   Skilled Therapeutic Interventions/Progress Updates:    OT treatment session focused on modified bathing/dressing, standing balance, motor planning, L NMR, and functional use of L UE. Pt on toilet with RN and  stedy upon OT arrival. Pt had already toileted s/p voiding. Sit<>stand w/ setdy w/ multimodal cues to initiate stand, then transferred pt onto tub shower bench. Max multimodal cues and hand-over-hand assist to remove L UE from stedy once seated on tub bench. Incorporated functional use of L UE and B UE coordination with bathing tasks requiring Tactile and verbal cues to initiate use of L hand.  Dressing completed at the sink with max a and demonstrational cues to motor plan hemi-techniques.  Pt incontinent of bladder in standing when working on pulling up pants and unaware requiring Max A to doff soiled clothing and don clean brief and pants. Pt left seated in wc at end of session with safety belt on, chair alarm on, and needs met.   Therapy Documentation Precautions:  Precautions Precautions: Fall Restrictions Weight Bearing Restrictions: No Pain: Pain Assessment Pain Assessment: No/denies pain Pain Score: 0-No  pain ADL: ADL ADL Comments: refer to functional navigator  See Function Navigator for Current Functional Status.   Therapy/Group: Individual Therapy   S  03/21/2017, 10:45 AM 

## 2017-03-21 NOTE — Progress Notes (Signed)
Physical Therapy Session Note  Patient Details  Name: Christina [name] MRN: [id number] Date of Birth: September 17, 1946  Today's Date: 03/21/2017 PT Individual Time: 1500-1615 PT Individual Time Calculation (min): 75 min   Short Term Goals: Week 1:  PT Short Term Goal 1 (Week 1): Patient will perform bed mobility with min A and max multimodal cues.  PT Short Term Goal 2 (Week 1): Patient will perform transfers with min A and max multimodal cues.  PT Short Term Goal 3 (Week 1): Patient will ambulate 150 ft with assist of 1 person.  PT Short Term Goal 4 (Week 1): Patient will maintain standing balance during functional task x 3 min with min A.   Skilled Therapeutic Interventions/Progress Updates:   Focus on sustained attention, task initiation/termination, following basic one step commands, safety awareness, L NMR, and postural control with functional mobility tasks. Patient sound asleep upon arrival, requiring max verbal cues for arousal. Patient required max multimodal cues for attention and initiation and more than reasonable time to transfer to EOB with HOB raised and use of rail, max A. Sitting EOB, donned B socks and shoes with total A. Patient impulsively attempting to stand without assistance throughout session. Performed sit <> stand throughout session with min A overall for lifting/lowering and hand over hand assist at times for safe hand placement. Patient requires max multimodal cues throughout session for upright posture and hip extension to neutral due to forward flexed posture at hips. Gait with HHA and using RW with assistance for keeping body close to RW with min-mod A overall, 2 x 150 ft. Stair training up/down 8 (3") stairs using 2 rails with step-to pattern and max verbal/visual cues for safely placing L foot completely on step and advancing LUE along rail, min A overall. Standing in standing frame without waist belt to promote upright posture with reaching task using LUE outside BOS to L and  across body. Performed NuStep using BUE/BLE at level 4 x 15 min, focus on increasing speed, attention to task, L NMR, reciprocal pattern retraining. Patient left sitting in wheelchair with all needs within reach, chair alarm and quick release belt on.   Therapy Documentation Precautions:  Precautions Precautions: Fall Restrictions Weight Bearing Restrictions: No Pain: Unrated back pain and c/o "discomfort in my body," provided rest and increased activity  See Function Navigator for Current Functional Status.   Therapy/Group: Individual Therapy  Jamion Carter, Prudencio Pair 03/21/2017, 4:26 PM

## 2017-03-21 NOTE — Progress Notes (Signed)
Speech Language Pathology Daily Session Note  Patient Details  Name: Christina [name] MRN: [id number] Date of Birth: 11-29-1946  Today's Date: 03/21/2017 SLP Individual Time: 0830-0930 SLP Individual Time Calculation (min): 60 min  Short Term Goals: Week 1: SLP Short Term Goal 1 (Week 1): Patient will utilize external aids for orientation to place, time and siutation with Min A verbal and visual cues.  SLP Short Term Goal 2 (Week 1): Patient will demonstrate sustained attention to a functional task for 15 minutes with supervision verbal cues.  SLP Short Term Goal 3 (Week 1): Patient will demonstrate functional problem solving for basic and familiar tasks with Mod A multimodal cues.  SLP Short Term Goal 4 (Week 1): Patient will identify 2 physical and 2 cognitive changes with Mod A verbal cues.   Skilled Therapeutic Interventions: Skilled treatment session focused on cognition goals. SLP facilitated session by providing Max A to Mod A for sustained attention to task for 15 minutes. Pt required Max A cues to problem solving opening breakfast items and using condiments. Pt poured coffee into oatmeal. Pt unable to identify any deficits related to CVA even with Total demonstration cues. Pt states that she doesn't rehab. Pt required Mod A cues to sequence 3 pictures of basic familiar tasks. Pt was left upright in wheelchair, safety belt donned and all needs within reach. Continue per current plan of care.      Function:    Cognition Comprehension Comprehension assist level: Understands basic 25 - 49% of the time/ requires cueing 50 - 75% of the time  Expression   Expression assist level: Expresses basic 50 - 74% of the time/requires cueing 25 - 49% of the time. Needs to repeat parts of sentences.  Social Interaction Social Interaction assist level: Interacts appropriately 75 - 89% of the time - Needs redirection for appropriate language or to initiate interaction.  Problem Solving Problem solving  assist level: Solves basic less than 25% of the time - needs direction nearly all the time or does not effectively solve problems and may need a restraint for safety  Memory Memory assist level: Recognizes or recalls less than 25% of the time/requires cueing greater than 75% of the time    Wess Baney B. Dreama Saa, M.S., CCC-SLP Speech-Language Pathologist   Pain Pain Assessment Pain Assessment: No/denies pain Pain Score: 0-No pain  Therapy/Group: Individual Therapy  Jhovani Griswold 03/21/2017, 12:30 PM

## 2017-03-22 ENCOUNTER — Inpatient Hospital Stay (HOSPITAL_COMMUNITY): Payer: Medicaid Other

## 2017-03-22 ENCOUNTER — Inpatient Hospital Stay (HOSPITAL_COMMUNITY): Payer: Self-pay

## 2017-03-22 ENCOUNTER — Inpatient Hospital Stay (HOSPITAL_COMMUNITY): Payer: Medicaid Other | Admitting: Speech Pathology

## 2017-03-22 ENCOUNTER — Inpatient Hospital Stay (HOSPITAL_COMMUNITY): Payer: Self-pay | Admitting: *Deleted

## 2017-03-22 DIAGNOSIS — F4323 Adjustment disorder with mixed anxiety and depressed mood: Secondary | ICD-10-CM

## 2017-03-22 MED ORDER — TEMAZEPAM 7.5 MG PO CAPS
15.0000 mg | ORAL_CAPSULE | Freq: Every evening | ORAL | Status: DC | PRN
  Administered 2017-03-22 – 2017-03-24 (×3): 15 mg via ORAL
  Filled 2017-03-22 (×3): qty 2

## 2017-03-22 MED ORDER — MIRTAZAPINE 15 MG PO TBDP
7.5000 mg | ORAL_TABLET | Freq: Every day | ORAL | Status: DC
  Administered 2017-03-22 – 2017-04-04 (×14): 7.5 mg via ORAL
  Filled 2017-03-22 (×15): qty 0.5

## 2017-03-22 MED ORDER — VERAPAMIL HCL ER 180 MG PO TBCR
360.0000 mg | EXTENDED_RELEASE_TABLET | Freq: Every day | ORAL | Status: DC
  Administered 2017-03-22 – 2017-04-04 (×13): 360 mg via ORAL
  Filled 2017-03-22 (×14): qty 2

## 2017-03-22 NOTE — Progress Notes (Signed)
Occupational Therapy Session Note  Patient Details  Name: Christina [name] MRN: [id number] Date of Birth: 05/29/1946  Today's Date: 03/22/2017 OT Individual Time: 0900-1000 OT Individual Time Calculation (min): 60 min   Short Term Goals: Week 1:  OT Short Term Goal 1 (Week 1): Pt will demonstrate improved awareness of L side by donning shirt over LUE with min cues. OT Short Term Goal 2 (Week 1): Pt will demonstrate improved standing balance to stand with UE support with min A as she pulls pants over her hips. OT Short Term Goal 3 (Week 1): Pt will demonstrate improved praxis by completing a stand pivot transfer to her L with min to mod A.  OT Short Term Goal 4 (Week 1): Pt will be able to self cleanse post toileting with steadying assist and 2-3 verbal cues.   Skilled Therapeutic Interventions/Progress Updates: ADL-retraining (45 min)  at shower level with focus on stand-pivot transfer skills, improved left attention, , and adapted dressing skills.   Pt received supine in bed awaiting therapist.  With mod vc for technique, extra time and  manual facilitation to weight-shift using hand-over-hand guidance to reach and place left hand while pivoting, pt completed stand-pivot transfer to w/c at her left side with  overall mod assist.   Pt requires manual contact to release grip of left hand throughout session.   Pt performed similar transfer to tub bench from w/c and mod vc to progress, sequence, and problem-solve while bathing due to left inattention.   Pt recovered to w/c and dressed upper body with setup and supervision, lower body with mod assist for left LE.  Pt then groomed at sink with close supervision and setup, mod assist to problem-solve and vc to incorporate left hand in tasks.    Therapeutic activities (15 min) with focus on improved left attention and gross coordination of LUE.   Pt was setup at dynavision for both lower quadrants only, w/c height, to attend to visual/audio cues and reaching.    Pt performed 4 trials improving reaction time from 4.10 average at left UE initially to 2.38 on last trial.   Pt was then escorted back to her room and left QRB applied and her son in room to attend to her needs.     Therapy Documentation   Pain: No/denies pain  ADL: ADL ADL Comments: refer to functional navigator   See Function Navigator for Current Functional Status.   Therapy/Group: Individual Therapy   Second session: Time:  1500-1530 Time Calculation (min): 30  min  Pain Assessment: No/denies pain    Skilled Therapeutic Interventions: Therapeutic activities with focus on improved sitting balance, LUE coordination, FMC and gross strengthening.   Pt received supine in bed with daughter asleep in recliner at beginning of session.    OT reviewed POC with daughter and patient and instructed pt on bed mobility, sitting balance, and technique to don and lace shoes.    Using therapist knee to simulate inclined stool, after setup to don left and right shoe, pt laced shoe laces on both shoes with both hands unassisted while sitting at EOB.   Pt then completed stand pivot transfer to w/c with mod assist and was escorted to rehab gym to complete 10 min of BUE strengthening using Sci-Fit.   Pt required continuous manual contact assist to perform arm ergometry at a modest pace of 22 rpm due to impaired sustained attention.   Pt was able to self-propel device for brief periods (10-20 sec) but lacked self-monitoring  of performance sufficient to sustain attention and maintain pace independently  Pt was then returned to her room and back to bed with daughter present to attend to her needs, prn.   See FIM for current functional status  Therapy/Group: Individual Therapy  Adeleigh Barletta 03/22/2017, 11:12 AM

## 2017-03-22 NOTE — Progress Notes (Signed)
Subjective/Complaints: Patient looks brighter today, she did complain to the nurse, she wanted to go home. We discussed her deficits. She just returned from PT. She realizes that she is weak and cannot walk by herself ROS- no CP, SOB, N/V/D Objective: Vital Signs: Blood pressure (!) 177/73, pulse 77, temperature 98.2 F (36.8 C), temperature source Oral, resp. rate 18, SpO2 100 %. No results found. Results for orders placed or performed during the hospital encounter of 03/19/17 (from the past 72 hour(s))  CBC WITH DIFFERENTIAL     Status: Abnormal   Collection Time: 03/20/17  7:11 AM  Result Value Ref Range   WBC 5.5 4.0 - 10.5 K/uL   RBC 3.66 (L) 3.87 - 5.11 MIL/uL   Hemoglobin 9.9 (L) 12.0 - 15.0 g/dL   HCT 31.2 (L) 36.0 - 46.0 %   MCV 85.2 78.0 - 100.0 fL   MCH 27.0 26.0 - 34.0 pg   MCHC 31.7 30.0 - 36.0 g/dL   RDW 13.6 11.5 - 15.5 %   Platelets 203 150 - 400 K/uL   Neutrophils Relative % 65 %   Neutro Abs 3.6 1.7 - 7.7 K/uL   Lymphocytes Relative 18 %   Lymphs Abs 1.0 0.7 - 4.0 K/uL   Monocytes Relative 9 %   Monocytes Absolute 0.5 0.1 - 1.0 K/uL   Eosinophils Relative 7 %   Eosinophils Absolute 0.4 0.0 - 0.7 K/uL   Basophils Relative 1 %   Basophils Absolute 0.0 0.0 - 0.1 K/uL  Comprehensive metabolic panel     Status: Abnormal   Collection Time: 03/20/17  7:11 AM  Result Value Ref Range   Sodium 140 135 - 145 mmol/L   Potassium 4.0 3.5 - 5.1 mmol/L   Chloride 106 101 - 111 mmol/L   CO2 25 22 - 32 mmol/L   Glucose, Bld 106 (H) 65 - 99 mg/dL   BUN 19 6 - 20 mg/dL   Creatinine, Ser 1.15 (H) 0.44 - 1.00 mg/dL   Calcium 9.5 8.9 - 10.3 mg/dL   Total Protein 6.0 (L) 6.5 - 8.1 g/dL   Albumin 3.0 (L) 3.5 - 5.0 g/dL   AST 28 15 - 41 U/L   ALT 25 14 - 54 U/L   Alkaline Phosphatase 57 38 - 126 U/L   Total Bilirubin 0.7 0.3 - 1.2 mg/dL   GFR calc non Af Amer 47 (L) >60 mL/min   GFR calc Af Amer 55 (L) >60 mL/min    Comment: (NOTE) The eGFR has been calculated using the CKD  EPI equation. This calculation has not been validated in all clinical situations. eGFR's persistently <60 mL/min signify possible Chronic Kidney Disease.    Anion gap 9 5 - 15     HEENT: normal Cardio: RRR and no murmur Resp: CTA B/L and unlabored GI: BS positive and NT, ND Extremity:  No Edema Skin:   Intact Neuro: Alert/Oriented, Abnormal Motor 4- LUE, 3- LLE and Abnormal FMC Ataxic/ dec FMC Musc/Skel:  Normal and Other no pain with UE or LE  Gen NAD   Assessment/Plan: 1. Functional deficits secondary to Left MCA which require 3+ hours per day of interdisciplinary therapy in a comprehensive inpatient rehab setting. Physiatrist is providing close team supervision and 24 hour management of active medical problems listed below. Physiatrist and rehab team continue to assess barriers to discharge/monitor patient progress toward functional and medical goals. FIM: Function - Bathing Position: Shower Body parts bathed by patient: Right arm, Left arm, Chest, Abdomen,  Front perineal area, Buttocks, Right upper leg, Left upper leg Body parts bathed by helper: Right lower leg, Left lower leg, Back Assist Level:  (Mod A)  Function- Upper Body Dressing/Undressing What is the patient wearing?: Pull over shirt/dress Pull over shirt/dress - Perfomed by patient: Pull shirt over trunk Pull over shirt/dress - Perfomed by helper: Thread/unthread right sleeve, Thread/unthread left sleeve, Put head through opening Assist Level:  (Max A) Function - Lower Body Dressing/Undressing What is the patient wearing?: Underwear, Pants, Non-skid slipper socks Position: Wheelchair/chair at sink Underwear - Performed by patient: Thread/unthread right underwear leg Underwear - Performed by helper: Thread/unthread left underwear leg, Pull underwear up/down Pants- Performed by patient: Thread/unthread right pants leg Pants- Performed by helper: Thread/unthread left pants leg, Pull pants up/down Non-skid slipper  socks- Performed by helper: Don/doff right sock, Don/doff left sock Assist for footwear: Maximal assist  Function - Toileting Toileting steps completed by patient: Performs perineal hygiene Toileting steps completed by helper: Adjust clothing after toileting, Adjust clothing prior to toileting, Performs perineal hygiene Toileting Assistive Devices: Other (comment) Charlaine Dalton) Assist level: Touching or steadying assistance (Pt.75%)  Function - Toilet Transfers Toilet transfer assistive device: Mechanical lift Mechanical lift: Stedy Assist level to toilet: Moderate assist (Pt 50 - 74%/lift or lower) Assist level from toilet: Moderate assist (Pt 50 - 74%/lift or lower)  Function - Chair/bed transfer Chair/bed transfer method: Ambulatory Chair/bed transfer assist level: Maximal assist (Pt 25 - 49%/lift and lower) Chair/bed transfer assistive device: Armrests  Function - Locomotion: Ambulation Assistive device: Hand held assist Max distance: 150 ft Assist level: Moderate assist (Pt 50 - 74%) Assist level: Moderate assist (Pt 50 - 74%) Assist level: Moderate assist (Pt 50 - 74%) Walk 150 feet activity did not occur: Safety/medical concerns Assist level: Moderate assist (Pt 50 - 74%) Walk 10 feet on uneven surfaces activity did not occur: Safety/medical concerns  Function - Comprehension Comprehension: Auditory Comprehension assist level: Understands basic 25 - 49% of the time/ requires cueing 50 - 75% of the time  Function - Expression Expression: Verbal Expression assist level: Expresses basic 50 - 74% of the time/requires cueing 25 - 49% of the time. Needs to repeat parts of sentences.  Function - Social Interaction Social Interaction assist level: Interacts appropriately 75 - 89% of the time - Needs redirection for appropriate language or to initiate interaction.  Function - Problem Solving Problem solving assist level: Solves basic less than 25% of the time - needs direction  nearly all the time or does not effectively solve problems and may need a restraint for safety  Function - Memory Memory assist level: Recognizes or recalls less than 25% of the time/requires cueing greater than 75% of the time Patient normally able to recall (first 3 days only): Current season, That he or she is in a hospital, Staff names and faces  Medical Problem List and Plan: 1.  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  DVT Prophylaxis/Anticoagulation: Eliquis 3. Pain Management: Tylenol as needed 4. Mood/insomnia. Restoril 15 mg daily at bedtime, will add Remeron 7.5 mg daily at bedtime, Restoril for short-term use only 5. Neuropsych: This patient is capable of making decisions on her own behalf. 6. Skin/Wound Care: Routine skin checks 7. Fluids/Electrolytes/Nutrition: Routine I&O with follow-up chemistries 8. Hypertension. Coreg 12.5 mg twice a day, verapamil 240 mg daily at bedtime increase to319m , Cozaar 100 mg daily. Monitor  Vitals:   03/21/17 2056 03/22/17 0556  BP: (Marland Kitchen  189/71 (!) 177/73  Pulse:  77  Resp:  18  Temp:  98.2 F (36.8 C)   9. Atrial fibrillation. Plan is currently to continue on Eliquis, rate controlled. Monitor on increased verapamil 10. Tobacco abuse. Counseling 11. Hyperlipidemia. Zocor  LOS (Days) 3 A FACE TO FACE EVALUATION WAS PERFORMED  Nashanti Duquette E 03/22/2017, 7:04 AM

## 2017-03-22 NOTE — Progress Notes (Signed)
Occupational Therapy Session Note  Patient Details  Name: Christina [name] MRN: [id number] Date of Birth: November 18, 1946  Today's Date: 03/22/2017 OT Individual Time: 1400-1430 OT Individual Time Calculation (min): 30 min    Short Term Goals: Week 1:  OT Short Term Goal 1 (Week 1): Pt will demonstrate improved awareness of L side by donning shirt over LUE with min cues. OT Short Term Goal 2 (Week 1): Pt will demonstrate improved standing balance to stand with UE support with min A as she pulls pants over her hips. OT Short Term Goal 3 (Week 1): Pt will demonstrate improved praxis by completing a stand pivot transfer to her L with min to mod A.  OT Short Term Goal 4 (Week 1): Pt will be able to self cleanse post toileting with steadying assist and 2-3 verbal cues.   Skilled Therapeutic Interventions/Progress Updates:    1:1 with complaint of back pain, but unable to report number. RN notified. Pt stand pivot transfer EOB<>w/c<>EOM with MAX A for lifting and pivoting with VC for hand placement, sequencing of transfer and safety awareness. Pt demo improved pivoting and weight shifting when describing the movement as "ballroom dancing" to shift feet back and forth. While seated in mat pt unfastens/fastens buttons to improve [facility] and use of B hands with VC to incorporated L hand. Pt demo decreased processing speed, apraxia and impulsivity throughout session frequently attempting to stand up. With OT seated in chair in front ofL knee. Pt completes sit to stand with MOD A for lifting and MIN A for balance while standing up to 1.5 min with VC for safety awareness weight shifting hips to R towards a target and posture. Pt returned to room supine in bed with call light in reach, daughter present and bed exit alarm on.   Therapy Documentation Precautions:  Precautions Precautions: Fall Restrictions Weight Bearing Restrictions: No General:   Vital Signs: Therapy Vitals Temp: 98.5 F (36.9 C) Temp Source:  Oral Pulse Rate: 70 Resp: 18 BP: (!) 188/71 Patient Position (if appropriate): Sitting Oxygen Therapy SpO2: 100 % O2 Device: Not Delivered Pain:   ADL: ADL ADL Comments: refer to functional navigator Exercises:   Other Treatments:    See Function Navigator for Current Functional Status.   Therapy/Group: Individual Therapy  Shon Hale 03/22/2017, 3:49 PM

## 2017-03-22 NOTE — Progress Notes (Signed)
Speech Language Pathology Daily Session Note  Patient Details  Name: Christina [name] MRN: [id number] Date of Birth: Feb 05, 1946  Today's Date: 03/22/2017 SLP Individual Time: 0815-0900 SLP Individual Time Calculation (min): 45 min  Short Term Goals: Week 1: SLP Short Term Goal 1 (Week 1): Patient will utilize external aids for orientation to place, time and siutation with Min A verbal and visual cues.  SLP Short Term Goal 2 (Week 1): Patient will demonstrate sustained attention to a functional task for 15 minutes with supervision verbal cues.  SLP Short Term Goal 3 (Week 1): Patient will demonstrate functional problem solving for basic and familiar tasks with Mod A multimodal cues.  SLP Short Term Goal 4 (Week 1): Patient will identify 2 physical and 2 cognitive changes with Mod A verbal cues.   Skilled Therapeutic Interventions: Skilled treatment session focused on cognitive goals. Upon arrival, patient was upright in the wheelchair and reported that she was "depressed."  However, with encouragement, patient participated in treatment session. Patient was oriented to place with Mod I and required supervision verbal cues for orientation to date and situation. Patient identified weakness on the left side of her body and that she feels confused at time with Mod A verbal cues. Patient participate in a basic money management task with Mod A verbal cues needed for problem solving and recall with task. Patient reported that she could previously work with money "better before" but proceeded to require total A for emergent awareness as to when clinician asked if task was harder, she said "no." Patient requested to use the bathroom and was transferred to the commode via the [place] with Min A verbal cues for safety. Patient handed off to OT. Continue with current plan of care.      Function:   Cognition Comprehension Comprehension assist level: Understands basic 25 - 49% of the time/ requires cueing 50 - 75%  of the time  Expression   Expression assist level: Expresses basic 50 - 74% of the time/requires cueing 25 - 49% of the time. Needs to repeat parts of sentences.  Social Interaction Social Interaction assist level: Interacts appropriately 75 - 89% of the time - Needs redirection for appropriate language or to initiate interaction.  Problem Solving Problem solving assist level: Solves basic less than 25% of the time - needs direction nearly all the time or does not effectively solve problems and may need a restraint for safety  Memory Memory assist level: Recognizes or recalls less than 25% of the time/requires cueing greater than 75% of the time    Pain No/Denies Pain   Therapy/Group: Individual Therapy  Davine Sweney 03/22/2017, 12:36 PM

## 2017-03-23 ENCOUNTER — Inpatient Hospital Stay (HOSPITAL_COMMUNITY): Payer: Medicaid Other

## 2017-03-23 NOTE — Progress Notes (Signed)
Subjective/Complaints: Pt very happy she got to go outside today ROS- no CP, SOB, N/V/D Objective: Vital Signs: Blood pressure (!) 130/56, pulse 60, temperature 98.5 F (36.9 C), temperature source Oral, resp. rate 18, SpO2 100 %. No results found. Results for orders placed or performed during the hospital encounter of 03/19/17 (from the past 72 hour(s))  CBC WITH DIFFERENTIAL     Status: Abnormal   Collection Time: 03/20/17  7:11 AM  Result Value Ref Range   WBC 5.5 4.0 - 10.5 K/uL   RBC 3.66 (L) 3.87 - 5.11 MIL/uL   Hemoglobin 9.9 (L) 12.0 - 15.0 g/dL   HCT 31.2 (L) 36.0 - 46.0 %   MCV 85.2 78.0 - 100.0 fL   MCH 27.0 26.0 - 34.0 pg   MCHC 31.7 30.0 - 36.0 g/dL   RDW 13.6 11.5 - 15.5 %   Platelets 203 150 - 400 K/uL   Neutrophils Relative % 65 %   Neutro Abs 3.6 1.7 - 7.7 K/uL   Lymphocytes Relative 18 %   Lymphs Abs 1.0 0.7 - 4.0 K/uL   Monocytes Relative 9 %   Monocytes Absolute 0.5 0.1 - 1.0 K/uL   Eosinophils Relative 7 %   Eosinophils Absolute 0.4 0.0 - 0.7 K/uL   Basophils Relative 1 %   Basophils Absolute 0.0 0.0 - 0.1 K/uL  Comprehensive metabolic panel     Status: Abnormal   Collection Time: 03/20/17  7:11 AM  Result Value Ref Range   Sodium 140 135 - 145 mmol/L   Potassium 4.0 3.5 - 5.1 mmol/L   Chloride 106 101 - 111 mmol/L   CO2 25 22 - 32 mmol/L   Glucose, Bld 106 (H) 65 - 99 mg/dL   BUN 19 6 - 20 mg/dL   Creatinine, Ser 1.15 (H) 0.44 - 1.00 mg/dL   Calcium 9.5 8.9 - 10.3 mg/dL   Total Protein 6.0 (L) 6.5 - 8.1 g/dL   Albumin 3.0 (L) 3.5 - 5.0 g/dL   AST 28 15 - 41 U/L   ALT 25 14 - 54 U/L   Alkaline Phosphatase 57 38 - 126 U/L   Total Bilirubin 0.7 0.3 - 1.2 mg/dL   GFR calc non Af Amer 47 (L) >60 mL/min   GFR calc Af Amer 55 (L) >60 mL/min    Comment: (NOTE) The eGFR has been calculated using the CKD EPI equation. This calculation has not been validated in all clinical situations. eGFR's persistently <60 mL/min signify possible Chronic  Kidney Disease.    Anion gap 9 5 - 15     HEENT: normal Cardio: RRR and no murmur Resp: CTA B/L and unlabored GI: BS positive and NT, ND Extremity:  No Edema Skin:   Intact Neuro: Alert/Oriented, Abnormal Motor 4- LUE, 3- LLE and Abnormal FMC Ataxic/ dec FMC Musc/Skel:  Normal and Other no pain with UE or LE  Gen NAD   Assessment/Plan: 1. Functional deficits secondary to Left MCA which require 3+ hours per day of interdisciplinary therapy in a comprehensive inpatient rehab setting. Physiatrist is providing close team supervision and 24 hour management of active medical problems listed below. Physiatrist and rehab team continue to assess barriers to discharge/monitor patient progress toward functional and medical goals. FIM: Function - Bathing Position: Shower Body parts bathed by patient: Right arm, Left arm, Chest, Abdomen, Front perineal area, Buttocks, Right upper leg, Left upper leg Body parts bathed by helper: Right lower leg, Left lower leg, Back Assist Level:  (Mod  A)  Function- Upper Body Dressing/Undressing What is the patient wearing?: Pull over shirt/dress Pull over shirt/dress - Perfomed by patient: Pull shirt over trunk Pull over shirt/dress - Perfomed by helper: Thread/unthread right sleeve, Thread/unthread left sleeve, Put head through opening Assist Level:  (Max A) Function - Lower Body Dressing/Undressing What is the patient wearing?: Underwear, Pants, Non-skid slipper socks Position: Wheelchair/chair at sink Underwear - Performed by patient: Thread/unthread right underwear leg Underwear - Performed by helper: Thread/unthread left underwear leg, Pull underwear up/down Pants- Performed by patient: Thread/unthread right pants leg Pants- Performed by helper: Thread/unthread left pants leg, Pull pants up/down Non-skid slipper socks- Performed by helper: Don/doff right sock, Don/doff left sock Assist for footwear: Maximal assist  Function - Toileting Toileting  steps completed by patient: Performs perineal hygiene Toileting steps completed by helper: Adjust clothing prior to toileting, Adjust clothing after toileting Toileting Assistive Devices: Toilet aid Assist level: Touching or steadying assistance (Pt.75%)  Function - Toilet Transfers Toilet transfer assistive device: Mechanical lift Mechanical lift: Stedy Assist level to toilet: Maximal assist (Pt 25 - 49%/lift and lower) Assist level from toilet: Maximal assist (Pt 25 - 49%/lift and lower)  Function - Chair/bed transfer Chair/bed transfer method: Stand pivot Chair/bed transfer assist level: Maximal assist (Pt 25 - 49%/lift and lower) Chair/bed transfer assistive device: Armrests  Function - Locomotion: Ambulation Assistive device: Hand held assist Max distance: 150 ft Assist level: Moderate assist (Pt 50 - 74%) Assist level: Moderate assist (Pt 50 - 74%) Assist level: Moderate assist (Pt 50 - 74%) Walk 150 feet activity did not occur: Safety/medical concerns Assist level: Moderate assist (Pt 50 - 74%) Walk 10 feet on uneven surfaces activity did not occur: Safety/medical concerns  Function - Comprehension Comprehension: Auditory Comprehension assist level: Understands basic 50 - 74% of the time/ requires cueing 25 - 49% of the time  Function - Expression Expression: Verbal Expression assist level: Expresses basic 50 - 74% of the time/requires cueing 25 - 49% of the time. Needs to repeat parts of sentences.  Function - Social Interaction Social Interaction assist level: Interacts appropriately 75 - 89% of the time - Needs redirection for appropriate language or to initiate interaction.  Function - Problem Solving Problem solving assist level: Solves basic 50 - 74% of the time/requires cueing 25 - 49% of the time  Function - Memory Memory assist level: Recognizes or recalls 50 - 74% of the time/requires cueing 25 - 49% of the time Patient normally able to recall (first 3 days  only): Current season, That he or she is in a hospital, Staff names and faces  Medical Problem List and Plan: 1.  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  DVT Prophylaxis/Anticoagulation: Eliquis 3. Pain Management: Tylenol as needed 4. Mood/insomnia. Restoril 15 mg daily at bedtime, will add Remeron 7.5 mg daily at bedtime, Restoril for short-term use only 5. Neuropsych: This patient is capable of making decisions on her own behalf. 6. Skin/Wound Care: Routine skin checks 7. Fluids/Electrolytes/Nutrition: Routine I&O with follow-up chemistries 8. Hypertension. Coreg 12.5 mg twice a day, verapamil 240 mg daily at bedtime increase to399m , Cozaar 100 mg daily.may need prn if fluctuations above 160 on regular basis  Monitor  Vitals:   03/22/17 2117 03/23/17 0505  BP: (!) 185/62 (!) 130/56  Pulse:  60  Resp:  18  Temp:  98.5 F (36.9 C)   9. Atrial fibrillation. Plan is currently to continue on Eliquis, rate controlled.  Monitor on increased verapamil 10. Tobacco abuse. Counseling 11. Hyperlipidemia. Zocor  LOS (Days) 4 A FACE TO FACE EVALUATION WAS PERFORMED  Dashay Giesler E 03/23/2017, 6:53 AM

## 2017-03-23 NOTE — Progress Notes (Signed)
At around 10 pm patient BP was 185/62, RN wanted to call the on-call but patient told RN not to worried about and said that's what happen when she is up set.

## 2017-03-23 NOTE — Progress Notes (Signed)
Occupational Therapy Session Note  Patient Details  Name: Christina [name] MRN: [id number] Date of Birth: May 30, 1946  Today's Date: 03/23/2017 OT Individual Time: 0800-0900 OT Individual Time Calculation (min): 60 min   Short Term Goals: Week 1:  OT Short Term Goal 1 (Week 1): Pt will demonstrate improved awareness of L side by donning shirt over LUE with min cues. OT Short Term Goal 2 (Week 1): Pt will demonstrate improved standing balance to stand with UE support with min A as she pulls pants over her hips. OT Short Term Goal 3 (Week 1): Pt will demonstrate improved praxis by completing a stand pivot transfer to her L with min to mod A.  OT Short Term Goal 4 (Week 1): Pt will be able to self cleanse post toileting with steadying assist and 2-3 verbal cues.   Skilled Therapeutic Interventions/Progress Updates: ADL-retraining with focus on adapted dressing skills, static standing balance, improved LUE coordination/FMC, improved awareness.   Pt received seated in w/c at sink with daughter present and assisting with grooming.   Pt was agitated by placement of her QRB but recovered with OT redirection to focus on POC.   With setup to place w/c at bedside, pt completed stand-pivot transfer to EOB with mod assist, extra time, and manual facilitation to advance LLE.   Pt sustained sitting balance unsupported with supervision and progressed through dressing tasks with setup, vc to sequence and min assist to attend to LUE (to release grip on objects and include left hand while dressing).    Pt demo's adequate FMC sufficient as non-dominant when using left hand but requires vc to incorporate left hand consistently while dressing.    Pt required mod assist to lace left leg and pull up pants while standing at EOB with steadying assist and tactile cues to improve posture.    Pt returned to w/c and finished grooming at sink with only supervision and setup to apply lipstick.    Following dressing/grooming, pt was  escorted seated in her w/c to outdoor lobby area on ground floor, west wing, to encourage change of environment while practicing sit<>stand and standing balance using railing.   Pt completed sit<>stand 3 times at railing near steps, sustaining static standing balance with only contact guard and intermittent vc for postural correction.   Pt was then returned to her room near bedside with all needs placed within reach and QRB re-applied.        Therapy Documentation Precautions:  Precautions Precautions: Fall Restrictions Weight Bearing Restrictions: No    Pain: 4/10, low back, back rub using pt's Beng[place] provided.     ADL: ADL ADL Comments: refer to functional navigator   See Function Navigator for Current Functional Status.   Therapy/Group: Individual Therapy  Brigett Estell 03/23/2017, 9:28 AM

## 2017-03-23 NOTE — IPOC Note (Signed)
Overall Plan of Care [facility]) Patient Details Name: Christina [name] MRN: [id number] DOB: Feb 18, 1946  Admitting Diagnosis: multiple infarcts  Hospital Problems: Active Problems:   Embolic infarction (HCC)   Left hemiparesis (HCC)   Primary osteoarthritis of left knee     Functional Problem List: Nursing Endurance, Pain, Safety, Skin Integrity  PT Balance, Behavior, Endurance, Motor, Nutrition, Pain, Perception, Safety  OT Balance, Cognition, Motor, Perception, Safety, Sensory  SLP Cognition  TR         Basic ADL's: OT Grooming, Bathing, Dressing, Toileting     Advanced  ADL's: OT       Transfers: PT Bed Mobility, Bed to Chair, Customer service manager, Tub/Shower     Locomotion: PT Ambulation, Psychologist, prison and probation services, Stairs     Additional Impairments: OT Fuctional Use of Upper Extremity  SLP Social Cognition   Social Interaction, Problem Solving, Attention, Awareness, Memory  TR      Anticipated Outcomes Item Anticipated Outcome  Self Feeding mod I  Swallowing      Basic self-care  supervision  Toileting  supervision   Bathroom Transfers supervision  Bowel/Bladder  Continent to bowel and bladder with min. assist  Transfers  supervision  Locomotion  supervision household  Communication     Cognition  Min A  Pain  Less than 2,on 1 to 10 scale  Safety/Judgment  Free from falls during her stay in rehab   Therapy Plan: PT Intensity: Minimum of 1-2 x/day ,45 to 90 minutes PT Frequency: 5 out of 7 days PT Duration Estimated Length of Stay: 2-3 weeks OT Intensity: Minimum of 1-2 x/day, 45 to 90 minutes OT Frequency: 5 out of 7 days OT Duration/Estimated Length of Stay: 21-23 days SLP Intensity: Minumum of 1-2 x/day, 30 to 90 minutes SLP Frequency: 3 to 5 out of 7 days SLP Duration/Estimated Length of Stay: 21-23 days        Team Interventions: Nursing Interventions Patient/Family Education, Disease Management/Prevention, Pain Management, Discharge Planning  PT  interventions Ambulation/gait training, Balance/vestibular training, Cognitive remediation/compensation, Community reintegration, Discharge planning, Disease management/prevention, DME/adaptive equipment instruction, Functional mobility training, Functional electrical stimulation, Neuromuscular re-education, Pain management, Patient/family education, Psychosocial support, Stair training, Splinting/orthotics, Therapeutic Activities, Therapeutic Exercise, UE/LE Strength taining/ROM, UE/LE Coordination activities, Wheelchair propulsion/positioning, Visual/perceptual remediation/compensation  OT Interventions Warden/ranger, Discharge planning, Cognitive remediation/compensation, DME/adaptive equipment instruction, Functional mobility training, Neuromuscular re-education, Patient/family education, Self Care/advanced ADL retraining, Therapeutic Activities, Therapeutic Exercise, UE/LE Strength taining/ROM, UE/LE Coordination activities, Visual/perceptual remediation/compensation  SLP Interventions Cognitive remediation/compensation, Cueing hierarchy, Environmental controls, Therapeutic Activities, Internal/external aids, Patient/family education  TR Interventions    SW/CM Interventions Discharge Planning, Psychosocial Support, Patient/Family Education    Team Discharge Planning: Destination: PT-Home ,OT- Home , SLP-Home Projected Follow-up: PT-Home health PT, 24 hour supervision/assistance, OT-  Home health OT, SLP-Home Health SLP, 24 hour supervision/assistance Projected Equipment Needs: PT-To be determined, OT- Tub/shower bench, SLP-None recommended by SLP Equipment Details: PT- , OT-  Patient/family involved in discharge planning: PT- Patient,  OT-Patient, SLP-Patient  MD ELOS: 14-18d Medical Rehab Prognosis:  Excellent Assessment:  71 y.o.right handed femalewith history of hypertension, prior tobacco abuse and recently diagnosed with CVA about 3 weeks ago while in [place] with  left-sided weakness and placed on aspirin and Plavix for CVA prophylaxis. She was later discharged to home but by report of family unable to ambulate. She was subsequently brought to the [place] by her daughter for further management and presented to [facility] 03/14/2017.Per chart review patient is from [place] been living in [place] for the past 30 years alone and was independent. She has family in [place] who can assist as needed.A CT of the head showed cerebral atrophy, ventriculomegaly and periventricular white matter disease. Suspect lacunar type left basal ganglia infarct. MRI of the brain showed multiple subacute infarcts in multiple vascular territories suggesting embolic disease from the heart or ascending aorta. There was involvement of the right thalamus, left parieto-occipital junction region and right frontal paramedian brain. CT angiogram head and neck showed no significant carotid stenosis or proximal anterior circulation disease.    Now requiring 24/7 Rehab RN,MD, as well as CIR level PT, OT and SLP.  Treatment team will focus on ADLs and mobility with goals set at Sup/Mod I See Team Conference Notes for weekly updates to the plan of care

## 2017-03-24 ENCOUNTER — Inpatient Hospital Stay (HOSPITAL_COMMUNITY): Payer: Medicaid Other | Admitting: Speech Pathology

## 2017-03-24 ENCOUNTER — Inpatient Hospital Stay (HOSPITAL_COMMUNITY): Payer: Self-pay | Admitting: Occupational Therapy

## 2017-03-24 ENCOUNTER — Inpatient Hospital Stay (HOSPITAL_COMMUNITY): Payer: Medicaid Other | Admitting: Physical Therapy

## 2017-03-24 NOTE — Progress Notes (Signed)
Occupational Therapy Session Note  Patient Details  Name: Christina [name] MRN: [id number] Date of Birth: [birth year]  Today's Date: 03/24/2017 OT Individual Time: 1300-1425 OT Individual Time Calculation (min): 85 min   Short Term Goals: Week 1:  OT Short Term Goal 1 (Week 1): Pt will demonstrate improved awareness of L side by donning shirt over LUE with min cues. OT Short Term Goal 2 (Week 1): Pt will demonstrate improved standing balance to stand with UE support with min A as she pulls pants over her hips. OT Short Term Goal 3 (Week 1): Pt will demonstrate improved praxis by completing a stand pivot transfer to her L with min to mod A.  OT Short Term Goal 4 (Week 1): Pt will be able to self cleanse post toileting with steadying assist and 2-3 verbal cues.   Skilled Therapeutic Interventions/Progress Updates:   Pt reports feeling depressed and that she doesn't want to be here or participate in therapy. OT utilized therapeutic listening and therapeutic use of self to comfort pt and validate pt's feelings. Discussed goals and purpose of OT- with improved affect and agreeable to OT. Utilized music therapy to improve mood as well as environmental changes including opening blinds and window. Addressed transfers, L side attention, L FMC, B UE and L UE coordination within meaningful painting activity. Bed mobility with Min A and max tactile and verbal cues to initiate bringing LE's off of bed. Tactile cues for hand placement and mod A to elevate trunk to sit EOB. Squat-pivot transfer to L with Max A, increased time, and verbal/tactile cues for positioning and technique. 10 wc pushups for B UE coordination and LLE activation. Pt able to clear bottom from wc 8/10x.  OT set-up painting activity towards L side. VC to locate paint colors on L and to integrate L UE when opening paint and stabilizing canvas. Pt had difficulty distinguishing outline of Dragonfly, requiring OT to dot color for pt to follow. Pt reported  she felt better after OT treatment session and left seated in wc with safety belt on and needs met.   Therapy Documentation Precautions:  Precautions Precautions: Fall Restrictions Weight Bearing Restrictions: No Pain: Pain Assessment Pain Assessment: No/denies pain Pain Score: 0-No pain ADL: ADL ADL Comments: refer to functional navigator See Function Navigator for Current Functional Status.   Therapy/Group: Individual Therapy  Valma Cava 03/24/2017, 2:36 PM

## 2017-03-24 NOTE — Progress Notes (Signed)
Speech Language Pathology Daily Session Note  Patient Details  Name: Christina [name]: [id number] Date of Birth: Oct 18, 1946  Today's Date: 03/24/2017 SLP Individual Time: 7829-5621 SLP Individual Time Calculation (min): 60 min  Short Term Goals: Week 1: SLP Short Term Goal 1 (Week 1): Patient will utilize external aids for orientation to place, time and siutation with Min A verbal and visual cues.  SLP Short Term Goal 2 (Week 1): Patient will demonstrate sustained attention to a functional task for 15 minutes with supervision verbal cues.  SLP Short Term Goal 3 (Week 1): Patient will demonstrate functional problem solving for basic and familiar tasks with Mod A multimodal cues.  SLP Short Term Goal 4 (Week 1): Patient will identify 2 physical and 2 cognitive changes with Mod A verbal cues.   Skilled Therapeutic Interventions: Skilled treatment session focused on cognition goals. SLP facilitated session by providing Max A to Mod A for intellectual awareness and for sustained attention to functional task for ~ 10 minutes. Pt with internal distractions of being locked in jail and tied to bed (I.e., being in hospital and having safety belt). Pt was redirectable but distractions prevented retention of information. Pt able to use external aids for orientation to place and situation with Mod A multimodal cues. Pt was returned to room, left upright in wheelchair, safety belt donned, chair alarm on and all needs within reach. Continue per current plan of care.      Function:    Cognition Comprehension Comprehension assist level: Understands basic 50 - 74% of the time/ requires cueing 25 - 49% of the time  Expression   Expression assist level: Expresses basic 50 - 74% of the time/requires cueing 25 - 49% of the time. Needs to repeat parts of sentences.  Social Interaction Social Interaction assist level: Interacts appropriately 50 - 74% of the time - May be physically or verbally inappropriate.   Problem Solving Problem solving assist level: Solves basic 50 - 74% of the time/requires cueing 25 - 49% of the time  Memory Memory assist level: Recognizes or recalls 50 - 74% of the time/requires cueing 25 - 49% of the time    Pain    Therapy/Group: Individual Therapy   Willey Due B. Dreama Saa, M.S., CCC-SLP Speech-Language Pathologist   Termaine Roupp 03/24/2017, 10:01 AM

## 2017-03-24 NOTE — Progress Notes (Signed)
Physical Therapy Session Note  Patient Details  Name: Christina [name] MRN: [id number] Date of Birth: 1946/02/13  Today's Date: 03/24/2017 PT Individual Time: 0910-1005 PT Individual Time Calculation (min): 55 min   Short Term Goals: Week 1:  PT Short Term Goal 1 (Week 1): Patient will perform bed mobility with min A and max multimodal cues.  PT Short Term Goal 2 (Week 1): Patient will perform transfers with min A and max multimodal cues.  PT Short Term Goal 3 (Week 1): Patient will ambulate 150 ft with assist of 1 person.  PT Short Term Goal 4 (Week 1): Patient will maintain standing balance during functional task x 3 min with min A.   Skilled Therapeutic Interventions/Progress Updates:  Pt received in w/c with daughter present for initial portion of session. Pt's daughter very distracting to pt, and attempting to assist with dressing her total assist, therefore therapist asked daughter to step out to allow pt to focus on task at hand. Pt requesting to get dressed for the day, requiring max assist to don bra with cuing to attend to L side of body. Pt required max assist to thread pants on BLE and max assist to pull shirt over trunk. Therapist provided max cuing for dressing with compensatory technique. Donned pt's ted hose total assist for time management. Pt able to don & tie R shoe with min assist from L hand, but required total assist for donning L shoe. Pt able to brush hair with set-up assist. Educated pt on current diagnosis and reason for hospitalization but pt with poor understanding of information. Transported pt to gym via w/c total assist for time management. Transfer training (sit<>stand) with max cuing for safety & hand placement. Gait training x 100 ft with RW & mod assist as pt required assistance for balance and for managing RW. Pt with poor ability to ambulate within base of RW, trunk flexion despite multimodal cuing, decreased weight shifting, decreased step length LLE, and minimal heel  strike BLE. Therapist provided manual facilitation for weight shifting but with minimal resulting correction in LLE step length & heel strike. Pt engaged in pipe tree assembly (2 most simple shapes) with pre-selected pieces, requiring moderate cuing for problem solving and error correction. Pt with fair attention to L and with active use of LUE during task. At end of session pt left sitting in w/c in room with QRB & chair alarm donned & all needs within reach.    Pt easily distracted throughout session, requiring cuing to attend to task at hand.   Therapy Documentation Precautions:  Precautions Precautions: Fall Restrictions Weight Bearing Restrictions: No  Pain: No c/o reported.   See Function Navigator for Current Functional Status.   Therapy/Group: Individual Therapy  Sandi Mariscal 03/24/2017, 10:40 AM

## 2017-03-25 ENCOUNTER — Inpatient Hospital Stay (HOSPITAL_COMMUNITY): Payer: Medicaid Other | Admitting: Physical Therapy

## 2017-03-25 ENCOUNTER — Inpatient Hospital Stay (HOSPITAL_COMMUNITY): Payer: Medicaid Other | Admitting: Occupational Therapy

## 2017-03-25 ENCOUNTER — Inpatient Hospital Stay (HOSPITAL_COMMUNITY): Payer: Medicaid Other | Admitting: Speech Pathology

## 2017-03-25 MED ORDER — HYDROCHLOROTHIAZIDE 10 MG/ML ORAL SUSPENSION
6.2500 mg | Freq: Every day | ORAL | Status: DC
  Administered 2017-03-25: 6.25 mg via ORAL
  Filled 2017-03-25 (×2): qty 1.25

## 2017-03-25 NOTE — Progress Notes (Signed)
Subjective/Complaints: Sleeping well feels tired this am but working on cognition with SLP ROS- no CP, SOB, N/V/D Objective: Vital Signs: Blood pressure (!) 167/50, pulse (!) 56, temperature 98.5 F (36.9 C), temperature source Oral, resp. rate 18, SpO2 100 %. No results found. No results found for this or any previous visit (from the past 72 hour(s)).   HEENT: normal Cardio: RRR and no murmur Resp: CTA B/L and unlabored GI: BS positive and NT, ND Extremity:  No Edema Skin:   Intact Neuro: Alert/Oriented, Abnormal Motor 4- LUE, 3- LLE and Abnormal FMC Ataxic/ dec FMC Musc/Skel:  Normal and Other no pain with UE or LE  Gen NAD   Assessment/Plan: 1. Functional deficits secondary to Left MCA which require 3+ hours per day of interdisciplinary therapy in a comprehensive inpatient rehab setting. Physiatrist is providing close team supervision and 24 hour management of active medical problems listed below. Physiatrist and rehab team continue to assess barriers to discharge/monitor patient progress toward functional and medical goals. FIM: Function - Bathing Position: Shower Body parts bathed by patient: Right arm, Left arm, Chest, Abdomen, Front perineal area, Buttocks, Right upper leg, Left upper leg Body parts bathed by helper: Right lower leg, Left lower leg, Back Assist Level:  (Mod A)  Function- Upper Body Dressing/Undressing What is the patient wearing?: Pull over shirt/dress Pull over shirt/dress - Perfomed by patient: Pull shirt over trunk Pull over shirt/dress - Perfomed by helper: Thread/unthread right sleeve, Thread/unthread left sleeve, Put head through opening Assist Level:  (Max A) Function - Lower Body Dressing/Undressing What is the patient wearing?: Underwear, Pants, Non-skid slipper socks Position: Wheelchair/chair at sink Underwear - Performed by patient: Thread/unthread right underwear leg Underwear - Performed by helper: Thread/unthread left underwear leg, Pull  underwear up/down Pants- Performed by patient: Thread/unthread right pants leg Pants- Performed by helper: Thread/unthread left pants leg, Pull pants up/down Non-skid slipper socks- Performed by helper: Don/doff right sock, Don/doff left sock Assist for footwear: Maximal assist  Function - Toileting Toileting steps completed by patient: Performs perineal hygiene Toileting steps completed by helper: Adjust clothing prior to toileting, Adjust clothing after toileting Toileting Assistive Devices: Grab bar or rail, Toilet aid Assist level: Two helpers  Function - Archivist transfer assistive device: Systems developer lift: Stedy Assist level to toilet: Maximal assist (Pt 25 - 49%/lift and lower) Assist level from toilet: Maximal assist (Pt 25 - 49%/lift and lower)  Function - Chair/bed transfer Chair/bed transfer method: Stand pivot Chair/bed transfer assist level: Maximal assist (Pt 25 - 49%/lift and lower) Chair/bed transfer assistive device: Armrests Mechanical lift: Stedy  Function - Locomotion: Ambulation Assistive device: Walker-rolling Max distance: 100 ft Assist level: Moderate assist (Pt 50 - 74%) Assist level: Moderate assist (Pt 50 - 74%) Assist level: Moderate assist (Pt 50 - 74%) Walk 150 feet activity did not occur: Safety/medical concerns Assist level: Moderate assist (Pt 50 - 74%) Walk 10 feet on uneven surfaces activity did not occur: Safety/medical concerns  Function - Comprehension Comprehension: Auditory Comprehension assist level: Understands basic 50 - 74% of the time/ requires cueing 25 - 49% of the time  Function - Expression Expression: Verbal Expression assist level: Expresses basic 50 - 74% of the time/requires cueing 25 - 49% of the time. Needs to repeat parts of sentences.  Function - Social Interaction Social Interaction assist level: Interacts appropriately 50 - 74% of the time - May be physically or verbally  inappropriate.  Function - Problem Solving Problem solving assist  level: Solves basic 50 - 74% of the time/requires cueing 25 - 49% of the time  Function - Memory Memory assist level: Recognizes or recalls 50 - 74% of the time/requires cueing 25 - 49% of the time Patient normally able to recall (first 3 days only): Current season, That he or she is in a hospital, Staff names and faces  Medical Problem List and Plan: 1.  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  DVT Prophylaxis/Anticoagulation: Eliquis 3. Pain Management: Tylenol as needed 4. Mood/insomnia. Restoril 15 mg daily at bedtime, Cont Remeron 7.5 mg daily at bedtime,D/C  Restoril  5. Neuropsych: This patient is capable of making decisions on her own behalf. 6. Skin/Wound Care: Routine skin checks 7. Fluids/Electrolytes/Nutrition: Routine I&O with follow-up chemistries 8. Hypertension. Coreg 12.5 mg twice a day, verapamil 240 mg daily at bedtime increase to360mg  , Cozaar 100 mg daily.add low dose HCTZ Vitals:   03/24/17 2110 03/25/17 0409  BP: (!) 166/55 (!) 167/50  Pulse: 63 (!) 56  Resp:  18  Temp:  98.5 F (36.9 C)   9. Atrial fibrillation. Plan is currently to continue on Eliquis, rate controlled. Monitor on increased verapamil 10. Tobacco abuse. Counseling 11. Hyperlipidemia. Zocor  LOS (Days) 6 A FACE TO FACE EVALUATION WAS PERFORMED  Zella Dewan E 03/25/2017, 6:05 AM

## 2017-03-25 NOTE — Progress Notes (Signed)
Physical Therapy Session Note  Patient Details  Name: Christina [name] MRN: [id number] Date of Birth: [birth year]  Today's Date: 03/25/2017 PT Individual Time: 0935-1020 and 1415-1500 PT Individual Time Calculation (min): 45 min and 45 min  Short Term Goals: Week 1:  PT Short Term Goal 1 (Week 1): Patient will perform bed mobility with min A and max multimodal cues.  PT Short Term Goal 2 (Week 1): Patient will perform transfers with min A and max multimodal cues.  PT Short Term Goal 3 (Week 1): Patient will ambulate 150 ft with assist of 1 person.  PT Short Term Goal 4 (Week 1): Patient will maintain standing balance during functional task x 3 min with min A.   Skilled Therapeutic Interventions/Progress Updates:   Treatment 1: Patient on commode upon arrival with daughter Aram Beecham present to observe session. Patient voided and then utilized Stedy to transfer sit <> stand multiple times and performed hygiene using RUE with setup assist and max A for clothing management. Patient ambulated from bathroom to sink for hand hygiene with mod A and L HHA with patient "furniture walking", steady assist to wash hands in standing with min cues for attention to LUE. Patient required max multimodal cues for upright posture and task initiation/termination. Gait training using RW x 100 ft with min A faded to mod A overall and max multimodal cues for upright posture due to forward flexion and PT guiding RW for improved stride length and gait speed. Patient negotiated up/down 4 (6") stairs using 2 rails with min A overall and greatly increased time with self selected step to pattern, max verbal cues to advance LUE along rail (but did not require hand over hand assist). Seated in wheelchair patient initially unable to complete LLE LAQ but by end of session, able to perform LAQ with verbal cues. Patient left sitting in wheelchair with needs in reach and daughter in room.    Treatment 2: Patient asleep in bed, requiring max  encouragement to participate due to c/o being tired. Eventually patient transferred to EOB with hand over hand assist to bring LUE across body, mod A to bring BLE off bed, patient able to elevate trunk but requiring hand over hand assist to bring LUE off bed rail. Patient required max A for stand pivot transfers with hand over hand assist for LUE placement on armrest. Patient performed NuStep using BUE/BLE at level 4 x 13 min with focus on L NMR, L attention, and attention to task. Gait training pushing grocery cart with PT controlling cart x 90 ft including turns with min A faded to mod A as patient fatigued. Patient more engaged in this task, telling others, "I'm shopping!" Seated EOM, performed ball toss with BUE with focus on facilitating upright posture and cervical/trunk rotation with minimal movement noted but able to successfully complete task with BUE. Patient requested to return to bed with mod A due to fatigue. Patient left semi reclined in bed with all needs within reach and bed alarm on.     Therapy Documentation Precautions:  Precautions Precautions: Fall Restrictions Weight Bearing Restrictions: No Pain: Pain Assessment Pain Assessment: Faces Faces Pain Scale: Hurts even more Pain Type: Chronic pain Pain Location: Back Pain Descriptors / Indicators: Aching;Grimacing Pain Onset: On-going Pain Intervention(s): Repositioned;Ambulation/increased activity;Rest  See Function Navigator for Current Functional Status.   Therapy/Group: Individual Therapy  Nohemi Nicklaus, Prudencio Pair 03/25/2017, 11:30 AM

## 2017-03-25 NOTE — Progress Notes (Signed)
Occupational Therapy Session Note  Patient Details  Name: Christina [name] MRN: [id number] Date of Birth: 03-22-1946  Today's Date: 03/25/2017 OT Individual Time: 1300-1400 OT Individual Time Calculation (min): 60 min    Short Term Goals: Week 1:  OT Short Term Goal 1 (Week 1): Pt will demonstrate improved awareness of L side by donning shirt over LUE with min cues. OT Short Term Goal 2 (Week 1): Pt will demonstrate improved standing balance to stand with UE support with min A as she pulls pants over her hips. OT Short Term Goal 3 (Week 1): Pt will demonstrate improved praxis by completing a stand pivot transfer to her L with min to mod A.  OT Short Term Goal 4 (Week 1): Pt will be able to self cleanse post toileting with steadying assist and 2-3 verbal cues.   Skilled Therapeutic Interventions/Progress Updates:    Pt seen for OT ADL bathing/dressing session. Pt sitting up in w/c upon arrival with daughter present. Pt's daughter exited upon beginning of tx session. Pt voiced increased soreness in back from prolonged time sitting in w/c. RN made aware though not yet time for pain meds. Shower and repositioned for comfort.  STEADY used to transfer pt into shower. She required increased assist for movement of L LE and required increased time and assist for doffing clothes. Max VCs needed for basic problem solving and tactile cuing for placement of extremities to utilize STEADY and for anterior/posterior weightshift. She bathed seated on tub bench with VCs for awareness to all limbs.  She dressed seated in w/c with same assist required due to limitations noted above. Pt requested return to supine to rest before next session. Mod-max stand pivot completed to EOB with max multimodal cuing required for L LE sequencing of transfer. Pt left in supine with all needs in reach and bed alarm on.   Therapy Documentation Precautions:  Precautions Precautions: Fall Restrictions Weight Bearing Restrictions:  No ADL: ADL ADL Comments: refer to functional navigator  See Function Navigator for Current Functional Status.   Therapy/Group: Individual Therapy  Lewis, Janah Mcculloh C 03/25/2017, 7:19 AM

## 2017-03-25 NOTE — Progress Notes (Signed)
Speech Language Pathology Daily Session Note  Patient Details  Name: Christina [name] MRN: [id number] Date of Birth: 1946-07-24  Today's Date: 03/25/2017 SLP Individual Time: 0730-0830 SLP Individual Time Calculation (min): 60 min  Short Term Goals: Week 1: SLP Short Term Goal 1 (Week 1): Patient will utilize external aids for orientation to place, time and siutation with Min A verbal and visual cues.  SLP Short Term Goal 2 (Week 1): Patient will demonstrate sustained attention to a functional task for 15 minutes with supervision verbal cues.  SLP Short Term Goal 3 (Week 1): Patient will demonstrate functional problem solving for basic and familiar tasks with Mod A multimodal cues.  SLP Short Term Goal 4 (Week 1): Patient will identify 2 physical and 2 cognitive changes with Mod A verbal cues.   Skilled Therapeutic Interventions: Skilled treatment session focused on cognition goals. SLP facilitated session by finished diagnostic testing with MOCA 7.1 Pt with deficits in the areas of STM, basic problem solving, attention, abstract problem solving, and orientation. Pt with 6 periods of spontaneous crying and emotional support and encouragement given. Max A cues given to identify phsycial deficits (I.e., left leg weakness) and need for therapy to address. Pt required Max encouragement to decrease perseveration in being in jail. Daughter arrived close of end of session and pt with increased agitation. SLP helped facilitate calm communication between pt and daughter however didn't help interaction. Pt left upright in bed, bed alarm on and all needs met. Daughter present. Continue per current plan of care.      Function:    Cognition Comprehension Comprehension assist level: Understands basic 50 - 74% of the time/ requires cueing 25 - 49% of the time  Expression   Expression assist level: Expresses basic 50 - 74% of the time/requires cueing 25 - 49% of the time. Needs to repeat parts of sentences.   Social Interaction Social Interaction assist level: Interacts appropriately 50 - 74% of the time - May be physically or verbally inappropriate.  Problem Solving Problem solving assist level: Solves basic 50 - 74% of the time/requires cueing 25 - 49% of the time  Memory Memory assist level: Recognizes or recalls 50 - 74% of the time/requires cueing 25 - 49% of the time    Pain    Therapy/Group: Individual Therapy   Lizbett Garciagarcia B. Rutherford Nail, M.S., [place] 03/25/2017, 9:02 AM

## 2017-03-26 ENCOUNTER — Encounter (HOSPITAL_COMMUNITY): Payer: Self-pay | Admitting: Psychology

## 2017-03-26 ENCOUNTER — Inpatient Hospital Stay (HOSPITAL_COMMUNITY): Payer: Medicaid Other | Admitting: Physical Therapy

## 2017-03-26 ENCOUNTER — Inpatient Hospital Stay (HOSPITAL_COMMUNITY): Payer: Self-pay

## 2017-03-26 ENCOUNTER — Inpatient Hospital Stay (HOSPITAL_COMMUNITY): Payer: Medicaid Other | Admitting: Speech Pathology

## 2017-03-26 DIAGNOSIS — D649 Anemia, unspecified: Secondary | ICD-10-CM

## 2017-03-26 DIAGNOSIS — N183 Chronic kidney disease, stage 3 unspecified: Secondary | ICD-10-CM

## 2017-03-26 DIAGNOSIS — F4323 Adjustment disorder with mixed anxiety and depressed mood: Secondary | ICD-10-CM

## 2017-03-26 DIAGNOSIS — D62 Acute posthemorrhagic anemia: Secondary | ICD-10-CM

## 2017-03-26 DIAGNOSIS — I10 Essential (primary) hypertension: Secondary | ICD-10-CM

## 2017-03-26 MED ORDER — HYDROCHLOROTHIAZIDE 12.5 MG PO CAPS
12.5000 mg | ORAL_CAPSULE | Freq: Every day | ORAL | Status: DC
  Administered 2017-03-27 – 2017-04-03 (×8): 12.5 mg via ORAL
  Filled 2017-03-26 (×8): qty 1

## 2017-03-26 NOTE — Progress Notes (Signed)
Social Work Patient ID: Christina [name], female   DOB: Nov 13, 1946, 71 y.o.   MRN: [id number]  Met with pt along with her two granddaughter's who were visiting her in the room, to discuss team conference goals supervision  Level and target discharge date of 4/18. Pt feels this is too long, discuss if family can come in for teaching and provide 24 hr care can go home sooner. Have left a message for daughter and will await return call. Neuro-psych to see pt today for coping. Will work on discharge needs. Granddaughter's reports she will have someone with her at home.

## 2017-03-26 NOTE — Progress Notes (Signed)
Physical Therapy Session Note  Patient Details  Name: Christina [name] MRN: [id number] Date of Birth: [birth year]  Today's Date: 03/26/2017 PT Individual Time: 4098-1191 PT Individual Time Calculation (min): 68 min   Short Term Goals: Week 1:  PT Short Term Goal 1 (Week 1): Patient will perform bed mobility with min A and max multimodal cues.  PT Short Term Goal 2 (Week 1): Patient will perform transfers with min A and max multimodal cues.  PT Short Term Goal 3 (Week 1): Patient will ambulate 150 ft with assist of 1 person.  PT Short Term Goal 4 (Week 1): Patient will maintain standing balance during functional task x 3 min with min A.   Skilled Therapeutic Interventions/Progress Updates:   Patient in wheelchair upon arrival. Patient with increased confusion and upset about being in "jail and leashed," referring to quick release belt for safety. Patient required total A for orientation to situation and intellectual awareness of physical and cognitive deficits and re-educated on goals and purpose of PT to increase patient independence and return to PLOF. Patient adamant that she would be fine at home by herself and people in [place] could have taken care of her. Patient eventually redirected to participate in therapy. Patient demonstrated increased active movement LLE this date. Session focused on gait training using RW x 3 trials up to 100 ft each with min A overall including turns with assistance for manuevering/positioning body inside of RW. In standing and gait, patient maintained forward flexed posture and body rotated to L, unable to correct with manual facilitation. Performed NuStep using BUE/BLE at level 4 x 10 min using BUE/BLE for L NMR and reciprocal pattern retraining. Attempted using BLE only to focus on timing and sequencing as well as muscle activation with patient with increased difficulty coordinating R and L LE despite max-total multimodal cues for gross BLE flexion/extension and  requiring max cues for attention to task. Patient upset about time spent sitting in chair and bed making her "weak" but requesting to return to bed throughout therapy due to fatigue. Performed stand pivot transfer with max A from wheelchair to bed for weight shifting and sequencing and heavy min A to lift LLE into bed to return to supine. Patient left semi reclined in bed with all needs in reach and bed alarm on.   Therapy Documentation Precautions:  Precautions Precautions: Fall Restrictions Weight Bearing Restrictions: No Pain: Pain Assessment Pain Assessment: Faces Pain Score: 10-Worst pain ever Faces Pain Scale: Hurts even more Pain Type: Chronic pain Pain Location: Back Pain Orientation: Lower Pain Descriptors / Indicators: Aching Pain Frequency: Intermittent Pain Onset: On-going Pain Intervention(s): Ambulation/increased activity;Distraction;Rest  See Function Navigator for Current Functional Status.   Therapy/Group: Individual Therapy  Milliana Reddoch, Prudencio Pair 03/26/2017, 11:55 AM

## 2017-03-26 NOTE — Consult Note (Signed)
Neuropsychological Consultation   Patient:   Christina [name]   DOB:   October 14, 1946  MR Number:  [id number]  Location:  [facility] [address] [id number] [place] [address] Dept: [phone] Loc: [phone]           Date of Service:   03/26/2017  Start Time:   3:55 PM End Time:   4:50 PM  Provider/Observer:  Arley Phenix, Psy.D.       Clinical Neuropsychologist       Billing Code/Service: [phone] 4 Units  Chief Complaint:    The patient has been having difficulty comprehending and adjusting to the neuro rehabilitative program. She has been confused at times about various minimal restraint she is to keep her from falling out of a wheelchair or bed. She has perceived them his attempts to keep her from leaving her feeling like his some restraint on her freedom. The patient is described as having some issues of depression and concern about being taken away from her home in [place] by her daughter and brought here for treatment and them lack of understanding about all of the need she has had from a medical standpoint.  Reason for Service:  Below is the H&P from the patient's medical records.  Christina [name] a 71 y.o.right handed femalewith history of hypertension, prior tobacco abuse and recently diagnosed with CVA about 3 weeks ago while in [place] with left-sided weakness and placed on aspirin and Plavix for CVA prophylaxis. She was later discharged to home but by report of family unable to ambulate. She was subsequently brought to the [place] by her daughter for further management and presented to [facility] 03/14/2017.Per chart review patient is from [place] had been living in [place] for the past 30 years alone and was independent. She has family in [place] who can assist as needed.A CT of the head showed cerebral atrophy, ventriculomegaly and periventricular white matter disease.  Suspect lacunar type left basal ganglia infarct. MRI of the brain showed multiple subacute infarcts in multiple vascular territories suggesting embolic disease from the heart or ascending aorta. There was involvement of the right thalamus, left parieto-occipital junction region and right frontal paramedian brain. CT angiogram head and neck showed no significant carotid stenosis or proximal anterior circulation disease. 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 Left PCA occlusion. Venous Dopplers lower extremities showed superficial thrombosis noted in the left lesser saphenous vein and in the varicosities of the Left calf. Echocardiogram with ejection fraction of 60% grade 1 diastolic dysfunction.  Patient remains on aspirin and Plavix for CVA prophylaxisHowever telemetry showed bouts of atrial fibrillation and was changed to Eliquis. No plan for TEE after telemetry monitored indicate atrial fibrillation.Marland Kitchen Physical and occupational therapy evaluations completed with recommendations of physical medicine rehabilitation consult.Patient was admitted for a comprehensive rehabilitation program  Current Status:  The patient has had difficulty with adjusting and has describes symptoms consistent with depression anxiety regarding her treatment stay. The patient is had a number of old cerebral infarcts including cerebellar infarcts as well as other regions of the brain. She is also had more recent cerebrovascular accidents.  Reliability of Information: Information is derived from review of available medical records, consultation with treatment staff, and 1 hour clinical interview with the patient. I also had an opportunity to talk to 2 of her granddaughters who are young adults.  Behavioral Observation: Christina [name]  presents as a 71 y.o.-year-old Right Hispanic Female who appeared her stated age. her dress was Appropriate and she was  Well Groomed and her manners were Appropriate to the situation.  her participation was indicative of Appropriate and Redirectable behaviors.  There were physical disabilities noted.  she displayed an appropriate level of cooperation and motivation.     Interactions:    Active Redirectable  Attention:   The patient clearly at times of distractibility primarily of internal preoccupation concerns about wanting to return to her life and her friends in [place] and some level of confusion about why she is here and what all the active interventions and efforts are for. and attention span appeared shorter than expected for age  Memory:   abnormal; global memory impairment noted  Visuo-spatial:  within normal limits  Speech (Volume):  normal  Speech:   normal; normal  Thought Process:  Tangential and Disorganized  Though Content:  WNL; not suicidal  Orientation:   person, place and situation  Judgment:   Fair  Planning:   Fair  Affect:    Depressed  Mood:    Anxious and Depressed  Insight:   Fair  Intelligence:   normal  Medical History:   Past Medical History:  Diagnosis Date  . Hypertension   . Stroke [facility])            Family Med/Psych History:  Family History  Problem Relation Age of Onset  . Heart disease Mother   . Diabetes Mother   . Heart disease Father   . Heart disease Sister   . Heart disease Sister     Risk of Suicide/Violence: virtually non-existent there did not appear to be any indications of suicidal or homicidal ideation or impulses.  Impression/DX:  The patient is a 71 year old female with both chronic older infarcts as well as more recent cerebrovascular accident. The patient was evaluated and admitted to the neuro rehabilitation program by her daughter who had brought the patient here from [place] The patient has had some difficulty adjusting to leaving her home and friends and family to come here but there were significant concerns on the  daughter's side about the level of medical care and availability that was afforded the patient and her small town in [place] The patient is having some difficulty adjusting to the situation I would diagnosis with that as an adjustment disorder but not a major depressive event. There are some clear indications of some cognitive deficits around executive functioning, motor functioning, and overall speed of mental operations. However, she did appear to be cognizant and able to make relatively good decisions. However, some confusion about the level of impairments that she has particular in issues such as her balance or leading her to misinterpreted some of the interventions that are being done such as minimal restraints while in the wheelchair to make sure she doesn't fall out.        Electronically Signed   _______________________ Arley Phenix, Psy.D.

## 2017-03-26 NOTE — Progress Notes (Signed)
Occupational Therapy Session Note  Patient Details  Name: Christina [name] MRN: [id number] Date of Birth: July 14, 1946  Today's Date: 03/26/2017 OT Individual Time: 1330-1400 OT Individual Time Calculation (min): 30 min  and Today's Date: 03/26/2017 OT Missed Time: 30 Minutes Missed Time Reason: Patient fatigue   Short Term Goals: Week 1:  OT Short Term Goal 1 (Week 1): Pt will demonstrate improved awareness of L side by donning shirt over LUE with min cues. OT Short Term Goal 2 (Week 1): Pt will demonstrate improved standing balance to stand with UE support with min A as she pulls pants over her hips. OT Short Term Goal 3 (Week 1): Pt will demonstrate improved praxis by completing a stand pivot transfer to her L with min to mod A.  OT Short Term Goal 4 (Week 1): Pt will be able to self cleanse post toileting with steadying assist and 2-3 verbal cues.   Skilled Therapeutic Interventions/Progress Updates:   Pt asleep in bed upon arrival.  Pt required min multimodal cues to arouse.  Pt's lunch tray on table beside bed.  Initially attempted to engage pt in self feeding of lunch.  Pt stated repeatedly that she was not hungry and did not want to eat.  Pt stated she was tired and upset because her daughter lied to her when bringing her to hospital.  Pt stated she wanted to return to her "[place] home" in [place]  Pt continually declined sitting EOB and engaging in any functional tasks (bathing/dressing, etc.) Pt remained in bed with all needs within reach and bed alarm activated.    Therapy Documentation Precautions:  Precautions Precautions: Fall Restrictions Weight Bearing Restrictions: No General: General OT Amount of Missed Time: 30 Minutes  Pain: Pain Assessment Pain Assessment: Faces Pain Score: 0-No pain Faces Pain Scale: Hurts even more Pain Type: Chronic pain Pain Location: Back Pain Orientation: Lower Pain Descriptors / Indicators: Aching Pain Onset: On-going Pain  Intervention(s): RN aware; repositioned   See Function Navigator for Current Functional Status.   Therapy/Group: Individual Therapy  Rich Brave 03/26/2017, 2:23 PM

## 2017-03-26 NOTE — Progress Notes (Signed)
Speech Language Pathology Weekly Progress and Session Note  Patient Details  Name: Christina [name] MRN: [id number] Date of Birth: March 23, 1946  Beginning of progress report period: March 20, 2017 End of progress report period: March 26, 2017  Today's Date: 03/26/2017 SLP Individual Time: 0800-0900 SLP Individual Time Calculation (min): 60 min  Short Term Goals: Week 1: SLP Short Term Goal 1 (Week 1): Patient will utilize external aids for orientation to place, time and siutation with Min A verbal and visual cues.  SLP Short Term Goal 1 - Progress (Week 1): Not met SLP Short Term Goal 2 (Week 1): Patient will demonstrate sustained attention to a functional task for 15 minutes with supervision verbal cues.  SLP Short Term Goal 2 - Progress (Week 1): Not met SLP Short Term Goal 3 (Week 1): Patient will demonstrate functional problem solving for basic and familiar tasks with Mod A multimodal cues.  SLP Short Term Goal 3 - Progress (Week 1): Progressing toward goal SLP Short Term Goal 4 (Week 1): Patient will identify 2 physical and 2 cognitive changes with Mod A verbal cues.  SLP Short Term Goal 4 - Progress (Week 1): Not met    New Short Term Goals: Week 2: SLP Short Term Goal 1 (Week 2): Patient will utilize external aids for orientation to place, time and siutation with Mod A verbal and visual cues.  SLP Short Term Goal 2 (Week 2): Patient will demonstrate sustained attention to a functional task for 15 minutes with Min A verbal cues.  SLP Short Term Goal 3 (Week 2): Patient will demonstrate functional problem solving for basic and familiar tasks with Mod A multimodal cues.  SLP Short Term Goal 4 (Week 2): Patient will identify 1 physical and 1 cognitive changes with Mod A verbal cues.   Weekly Progress Updates: Pt has made minimal progress this reporting period and as a result has not met any of her STGs. Pt has been very emotional throughout this reporting period with frequent bouts of crying  or anger towards being in rehab. She frequently states that people are trying to take her house and that she is in jail (referring to rehab). Pt is not easily redirected to functonal tasks. Pt continues to require Max to Mod A to complete functional tasks d/t deficits in attention, orientation, and impaired intellectual awareness. Skilled St continues to be required to address these deficits, increase functional independence and reduce caregiver burden. Due to pt's decreased progress, I anticipate that pt will require 24 hour supervision at discharge.      Intensity: Minumum of 1-2 x/day, 30 to 90 minutes Frequency: 3 to 5 out of 7 days Duration/Length of Stay: 21 to 23 days Treatment/Interventions: Cognitive remediation/compensation;Cueing hierarchy;Environmental controls;Therapeutic Activities;Internal/external aids;Patient/family education   Daily Session  Skilled Therapeutic Interventions: Skilled treatment session focused on cognition goals. SLP facilitated session by providing Mod A verbal cues to correctly sequence 3 to 4 picture cards. Pt required Mod A verbal cues for sustained orientation to task. Pt continues to require Max A to Mod A verbal and visual cues for orientation information and then she is not able to retain info. Max A multimodal cues provided for identifying 1 physical deficits related to CVA and pt is highly resistive to any suggestion of cognitive impairments. Pt was returned to room, left upright in wheelchair and all needs within reach. Continue per current plan of care.      Function:     Cognition Comprehension Comprehension assist level: Understands basic  50 - 74% of the time/ requires cueing 25 - 49% of the time  Expression   Expression assist level: Expresses basic 50 - 74% of the time/requires cueing 25 - 49% of the time. Needs to repeat parts of sentences.  Social Interaction Social Interaction assist level: Interacts appropriately 50 - 74% of the time - May  be physically or verbally inappropriate.  Problem Solving Problem solving assist level: Solves basic 50 - 74% of the time/requires cueing 25 - 49% of the time  Memory Memory assist level: Recognizes or recalls 50 - 74% of the time/requires cueing 25 - 49% of the time   General    Pain Pain Assessment Pain Assessment: Faces Pain Score: 0-No pain Faces Pain Scale: Hurts even more Pain Type: Chronic pain Pain Location: Back Pain Orientation: Lower Pain Descriptors / Indicators: Aching Pain Frequency: Intermittent Pain Onset: On-going Pain Intervention(s): Ambulation/increased activity;Distraction;Rest  Therapy/Group: Individual Therapy  Timur Nibert 03/26/2017, 1:20 PM

## 2017-03-26 NOTE — Patient Care Conference (Signed)
Inpatient RehabilitationTeam Conference and Plan of Care Update Date: 03/26/2017   Time: 2:20 PM    Patient Name: Christina [name]      Medical Record Number: [id number]  Date of Birth: [birth year] Sex: Female         Room/Bed: 4W10C/4W10C-01 Payor Info: Payor: MEDICAID PENDING / Plan: MEDICAID PENDING / Product Type: *No Product type* /    Admitting Diagnosis: multiple infarcts  Admit Date/Time:  03/19/2017  5:31 PM Admission Comments: No comment available   Primary Diagnosis:  <principal problem not specified> Principal Problem: <principal problem not specified>  Patient Active Problem List   Diagnosis Date Noted  . AKI (acute kidney injury) (HCC)   . Stage 3 chronic kidney disease   . Acute blood loss anemia   . Benign essential HTN   . Adjustment disorder with mixed anxiety and depressed mood   . 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 Left hemiparesis (HCC)   . Primary osteoarthritis of left knee   . Left knee pain   . Diplopia   . Acute cystitis without hematuria   . Stroke (HCC)   . TIA (transient ischemic attack) 03/14/2017  . History of CVA (cerebrovascular accident) 03/14/2017  . HTN (hypertension) 03/14/2017  . HLD (hyperlipidemia) 03/14/2017  . 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    Expected Discharge Date: Expected Discharge Date: 04/09/17  Team Members Present: Physician leading conference: Dr. 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 senna given prn 4/3. incont of bladder at noc, tonight  pt stated "I am afraid to call at night"! encourage pt it is ok and that is why we are here!   maintain b/b with min assist  monitor b/b status q shift and prn   Swallow/Nutrition/ Hydration             ADL's   min - mod A due to apraxia and L side weakness and inattention  supervision overall   ADL retraining, functional mobility, NMR, L side awareness    Mobility   min-max A, apraxic, L inattention  supervision  L NMR, L attention, functional mobility training, cognitive remediation, pt/fam education   Communication             Safety/Cognition/ Behavioral Observations  Max A for orientation, basic problem solving, recall of new information and sustained attention  Mod I  external aids for recall of new infomraiton and orientation information, sustained attention and basic problem solving   Pain   occasional lower back pain controlled with tylenol  pain < or = 2  monitor pain q shift and prn   Skin   no skin issues  maintain skin CDI while on IR  monitor q shift and prn       *See Care Plan and progress notes for long and short-term goals.  Barriers to Discharge: Mood, safety, mobility, HTN    Possible Resolutions to Barriers:  Therapies, optimize BP meds    Discharge Planning/Teaching Needs:  Home with daughter who is working on 24 hr supervision level.  Made aware pt will require this at discharge. Has applied for Medicaid while here      Team Discussion:  Pt having difficulty buying into the rehab program, feels sick will get better with time. Goals supervision level for safety and cueing. Cont Bowel and bladder incontinent during the day. L-inattention and carry over issues. Apraxic with leg. MD adjusting BP meds running too high. Neuro-psych to see today for coping  Revisions to Treatment Plan:  DC 4/18   Continued Need for Acute Rehabilitation Level of Care: The patient requires daily medical management by a physician with specialized training in physical medicine and  rehabilitation for the following conditions: Daily direction of a multidisciplinary physical rehabilitation program to ensure safe treatment while eliciting the highest outcome that is of practical value to the patient.: Yes Daily medical management of patient stability for increased activity during participation in an intensive rehabilitation regime.: Yes Daily analysis of laboratory values and/or radiology reports with any subsequent need for medication adjustment of medical intervention for : Cardiac problems;Neurological problems;Blood pressure problems;Renal problems  Lucy Chris 03/27/2017, 12:26 PM

## 2017-03-26 NOTE — Progress Notes (Signed)
Subjective/Complaints: Pt seen laying in bed this AM.  She slept well overnight.  She needs to use the restroom this AM.     ROS- Denies CP, SOB, N/V/D Objective: Vital Signs: Blood pressure (!) 173/66, pulse (!) 58, temperature 98.4 F (36.9 C), temperature source Oral, resp. rate 18, SpO2 100 %. No results found. No results found for this or any previous visit (from the past 72 hour(s)).   Gen NAD. Vital signs reviewed. HEENT: Normocephalic, atraumatic.  Cardio: Regular rate. No JVD. Resp: CTA B/L and unlabored GI: BS positive and NT Musc/Skel:  No edema. No tenderness Neuro: Alert/Oriented Motor RUE 5/5 RLE 4/5 LUE 4/5 LLE 3-/5  Skin:   Intact. Warm and dry  Assessment/Plan: 1. Functional deficits secondary to Left MCA which require 3+ hours per day of interdisciplinary therapy in a comprehensive inpatient rehab setting. Physiatrist is providing close team supervision and 24 hour management of active medical problems listed below. Physiatrist and rehab team continue to assess barriers to discharge/monitor patient progress toward functional and medical goals. FIM: Function - Bathing Position: Shower Body parts bathed by patient: Right arm, Left arm, Chest, Abdomen, Front perineal area, Right upper leg, Left upper leg Body parts bathed by helper: Back, Buttocks, Right lower leg, Left lower leg Assist Level: Touching or steadying assistance(Pt > 75%)  Function- Upper Body Dressing/Undressing What is the patient wearing?: Pull over shirt/dress Pull over shirt/dress - Perfomed by patient: Thread/unthread right sleeve, Thread/unthread left sleeve Pull over shirt/dress - Perfomed by helper: Put head through opening, Pull shirt over trunk Assist Level:  (Max A) Function - Lower Body Dressing/Undressing What is the patient wearing?: Underwear, Pants, Non-skid slipper socks Position: Wheelchair/chair at sink Underwear - Performed by patient: Thread/unthread right underwear  leg Underwear - Performed by helper: Thread/unthread left underwear leg, Pull underwear up/down Pants- Performed by patient: Thread/unthread right pants leg Pants- Performed by helper: Thread/unthread left pants leg, Pull pants up/down Non-skid slipper socks- Performed by patient: Don/doff left sock Non-skid slipper socks- Performed by helper: Don/doff right sock Assist for footwear: Maximal assist Assist for lower body dressing: Touching or steadying assistance (Pt > 75%)  Function - Toileting Toileting steps completed by patient: Performs perineal hygiene Toileting steps completed by helper: Adjust clothing prior to toileting, Adjust clothing after toileting Toileting Assistive Devices: Grab bar or rail Assist level: Touching or steadying assistance (Pt.75%)  Function - Archivist transfer assistive device: Grab bar Mechanical lift: Stedy Assist level to toilet: Maximal assist (Pt 25 - 49%/lift and lower) Assist level from toilet: Touching or steadying assistance (Pt > 75%)  Function - Chair/bed transfer Chair/bed transfer method: Stand pivot Chair/bed transfer assist level: Maximal assist (Pt 25 - 49%/lift and lower) Chair/bed transfer assistive device: Armrests Mechanical lift: Stedy  Function - Locomotion: Ambulation Assistive device: Walker-rolling Max distance: 100 ft Assist level: Moderate assist (Pt 50 - 74%) Assist level: Moderate assist (Pt 50 - 74%) Assist level: Moderate assist (Pt 50 - 74%) Walk 150 feet activity did not occur: Safety/medical concerns Assist level: Moderate assist (Pt 50 - 74%) Walk 10 feet on uneven surfaces activity did not occur: Safety/medical concerns  Function - Comprehension Comprehension: Auditory Comprehension assist level: Understands basic 50 - 74% of the time/ requires cueing 25 - 49% of the time  Function - Expression Expression: Verbal Expression assist level: Expresses basic 50 - 74% of the time/requires cueing 25 -  49% of the time. Needs to repeat parts of sentences.  Function - Social  Interaction Social Interaction assist level: Interacts appropriately 50 - 74% of the time - May be physically or verbally inappropriate.  Function - Problem Solving Problem solving assist level: Solves basic 50 - 74% of the time/requires cueing 25 - 49% of the time  Function - Memory Memory assist level: Recognizes or recalls 50 - 74% of the time/requires cueing 25 - 49% of the time Patient normally able to recall (first 3 days only): Current season, That he or she is in a hospital, Staff names and faces  Medical Problem List and Plan: 1.  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  DVT Prophylaxis/Anticoagulation: Eliquis 3. Pain Management: Tylenol as needed 4. Mood: Cont Remeron 7.5 mg daily at bedtime  5. Neuropsych: This patient is capable of making decisions on her own behalf. 6. Skin/Wound Care: Routine skin checks 7. Fluids/Electrolytes/Nutrition: Routine I&Os 8. Hypertension. Coreg 12.5 mg twice a day, verapamil 240 mg daily at bedtime increase to360mg  , Cozaar 100 mg daily  HCTZ increased to 12.5 on 4/4 9. Atrial fibrillation. Plan is currently to continue on Eliquis, rate controlled. Monitor on increased verapamil 10. Tobacco abuse. Counseling 11. Hyperlipidemia. Zocor 12. ABLA  Hb 9.9 on 3/29  Labs ordered for tomorrow 13. CKD  Cr 1.15 on 3/29   Labs ordered for tomorrow  LOS (Days) 7 A FACE TO FACE EVALUATION WAS PERFORMED  Christina [name] 03/26/2017, 9:24 AM

## 2017-03-27 ENCOUNTER — Inpatient Hospital Stay (HOSPITAL_COMMUNITY): Payer: Medicaid Other | Admitting: Physical Therapy

## 2017-03-27 ENCOUNTER — Inpatient Hospital Stay (HOSPITAL_COMMUNITY): Payer: Medicaid Other | Admitting: Occupational Therapy

## 2017-03-27 ENCOUNTER — Inpatient Hospital Stay (HOSPITAL_COMMUNITY): Payer: Medicaid Other | Admitting: Speech Pathology

## 2017-03-27 DIAGNOSIS — N179 Acute kidney failure, unspecified: Secondary | ICD-10-CM

## 2017-03-27 DIAGNOSIS — N189 Chronic kidney disease, unspecified: Secondary | ICD-10-CM

## 2017-03-27 LAB — BASIC METABOLIC PANEL
ANION GAP: 9 (ref 5–15)
BUN: 23 mg/dL — ABNORMAL HIGH (ref 6–20)
CO2: 26 mmol/L (ref 22–32)
Calcium: 9.5 mg/dL (ref 8.9–10.3)
Chloride: 105 mmol/L (ref 101–111)
Creatinine, Ser: 1.47 mg/dL — ABNORMAL HIGH (ref 0.44–1.00)
GFR calc Af Amer: 41 mL/min — ABNORMAL LOW (ref >60)
GFR calc non Af Amer: 35 mL/min — ABNORMAL LOW (ref >60)
GLUCOSE: 100 mg/dL — AB (ref 65–99)
POTASSIUM: 3.7 mmol/L (ref 3.5–5.1)
Sodium: 140 mmol/L (ref 135–145)

## 2017-03-27 LAB — CBC WITH DIFFERENTIAL/PLATELET
BASOS ABS: 0 10*3/uL (ref 0.0–0.1)
Basophils Relative: 1 %
EOS PCT: 10 %
Eosinophils Absolute: 0.6 10*3/uL (ref 0.0–0.7)
HEMATOCRIT: 30.9 % — AB (ref 36.0–46.0)
Hemoglobin: 9.8 g/dL — ABNORMAL LOW (ref 12.0–15.0)
LYMPHS ABS: 1.2 10*3/uL (ref 0.7–4.0)
LYMPHS PCT: 22 %
MCH: 27.5 pg (ref 26.0–34.0)
MCHC: 31.7 g/dL (ref 30.0–36.0)
MCV: 86.8 fL (ref 78.0–100.0)
MONO ABS: 0.5 10*3/uL (ref 0.1–1.0)
Monocytes Relative: 10 %
NEUTROS ABS: 3.1 10*3/uL (ref 1.7–7.7)
Neutrophils Relative %: 57 %
Platelets: 193 10*3/uL (ref 150–400)
RBC: 3.56 MIL/uL — AB (ref 3.87–5.11)
RDW: 14 % (ref 11.5–15.5)
WBC: 5.4 10*3/uL (ref 4.0–10.5)

## 2017-03-27 NOTE — Progress Notes (Signed)
Social Work Patient ID: Christina [name], female   DOB: October 22, 1946, 71 y.o.   MRN: [id number]  Spoke with daughter via telephone to discuss team conference goals supervision level and target discharge date 4/18. She plans to have pt go to her brother's home during the day and then to her home when she is finished working for the evenings and nights. She is trying to encouraging her Mom to participate in therapies and work hard. Pt feels she can go home sooner and wants to do this. Daughter feels she is benefiting from being here and getting stronger. Pt at times feels she is in jail and being held against her will. Will ask RN and PA to give daughter Information regarding strokes due to feel daughter doesn't fully understand pt's deficits. Continue to work on discharge needs. Neuro-psych to see pt for coping.

## 2017-03-27 NOTE — Progress Notes (Signed)
Subjective/Complaints: Pt seen laying in bed this AM.  She slept well after falling asleep.  ROS; Denies CP, SOB, N/V/D  Objective: Vital Signs: Blood pressure (!) 114/49, pulse (!) 50, temperature 98.5 F (36.9 C), temperature source Oral, resp. rate 18, weight 65.2 kg (143 lb 13.6 oz), SpO2 100 %. No results found. Results for orders placed or performed during the hospital encounter of 03/19/17 (from the past 72 hour(s))  CBC with Differential/Platelet     Status: Abnormal   Collection Time: 03/27/17  5:48 AM  Result Value Ref Range   WBC 5.4 4.0 - 10.5 K/uL   RBC 3.56 (L) 3.87 - 5.11 MIL/uL   Hemoglobin 9.8 (L) 12.0 - 15.0 g/dL   HCT 30.9 (L) 36.0 - 46.0 %   MCV 86.8 78.0 - 100.0 fL   MCH 27.5 26.0 - 34.0 pg   MCHC 31.7 30.0 - 36.0 g/dL   RDW 14.0 11.5 - 15.5 %   Platelets 193 150 - 400 K/uL   Neutrophils Relative % 57 %   Neutro Abs 3.1 1.7 - 7.7 K/uL   Lymphocytes Relative 22 %   Lymphs Abs 1.2 0.7 - 4.0 K/uL   Monocytes Relative 10 %   Monocytes Absolute 0.5 0.1 - 1.0 K/uL   Eosinophils Relative 10 %   Eosinophils Absolute 0.6 0.0 - 0.7 K/uL   Basophils Relative 1 %   Basophils Absolute 0.0 0.0 - 0.1 K/uL  Basic metabolic panel     Status: Abnormal   Collection Time: 03/27/17  5:48 AM  Result Value Ref Range   Sodium 140 135 - 145 mmol/L   Potassium 3.7 3.5 - 5.1 mmol/L   Chloride 105 101 - 111 mmol/L   CO2 26 22 - 32 mmol/L   Glucose, Bld 100 (H) 65 - 99 mg/dL   BUN 23 (H) 6 - 20 mg/dL   Creatinine, Ser 1.47 (H) 0.44 - 1.00 mg/dL   Calcium 9.5 8.9 - 10.3 mg/dL   GFR calc non Af Amer 35 (L) >60 mL/min   GFR calc Af Amer 41 (L) >60 mL/min    Comment: (NOTE) The eGFR has been calculated using the CKD EPI equation. This calculation has not been validated in all clinical situations. eGFR's persistently <60 mL/min signify possible Chronic Kidney Disease.    Anion gap 9 5 - 15     Gen NAD. Vital signs reviewed. HEENT: Normocephalic, atraumatic.  Cardio: RRR.  No JVD. Resp: CTA B/L and unlabored GI: BS positive and NT Musc/Skel:  No edema. No tenderness Neuro: Alert/Oriented Motor RUE 5/5 RLE 4/5 HF, 4+/5 KE, ADF/PF LUE 4/5 LLE 3/5 HF, 3+/5 KE, ADF/PF Skin:   Intact. Warm and dry  Assessment/Plan: 1. Functional deficits secondary to Left MCA which require 3+ hours per day of interdisciplinary therapy in a comprehensive inpatient rehab setting. Physiatrist is providing close team supervision and 24 hour management of active medical problems listed below. Physiatrist and rehab team continue to assess barriers to discharge/monitor patient progress toward functional and medical goals. FIM: Function - Bathing Position: Shower Body parts bathed by patient: Right arm, Left arm, Chest, Abdomen, Front perineal area, Right upper leg, Left upper leg Body parts bathed by helper: Back, Buttocks, Right lower leg, Left lower leg Assist Level: Touching or steadying assistance(Pt > 75%)  Function- Upper Body Dressing/Undressing What is the patient wearing?: Pull over shirt/dress Pull over shirt/dress - Perfomed by patient: Thread/unthread right sleeve, Thread/unthread left sleeve Pull over shirt/dress - Perfomed by helper:  Put head through opening, Pull shirt over trunk Assist Level:  (Max A) Function - Lower Body Dressing/Undressing What is the patient wearing?: Underwear, Pants, Non-skid slipper socks Position: Wheelchair/chair at sink Underwear - Performed by patient: Thread/unthread right underwear leg Underwear - Performed by helper: Thread/unthread left underwear leg, Pull underwear up/down Pants- Performed by patient: Thread/unthread right pants leg Pants- Performed by helper: Thread/unthread left pants leg, Pull pants up/down Non-skid slipper socks- Performed by patient: Don/doff left sock Non-skid slipper socks- Performed by helper: Don/doff right sock Assist for footwear: Maximal assist Assist for lower body dressing: Touching or steadying  assistance (Pt > 75%)  Function - Toileting Toileting steps completed by patient: Performs perineal hygiene Toileting steps completed by helper: Adjust clothing prior to toileting, Adjust clothing after toileting Toileting Assistive Devices: Grab bar or rail Assist level: Touching or steadying assistance (Pt.75%)  Function - Air cabin crew transfer assistive device: Grab bar Mechanical lift: Stedy Assist level to toilet: Maximal assist (Pt 25 - 49%/lift and lower) Assist level from toilet: Touching or steadying assistance (Pt > 75%)  Function - Chair/bed transfer Chair/bed transfer method: Stand pivot Chair/bed transfer assist level: Moderate assist (Pt 50 - 74%/lift or lower) Chair/bed transfer assistive device: Armrests Mechanical lift: Stedy  Function - Locomotion: Ambulation Assistive device: Walker-rolling Max distance: 100 ft Assist level: Touching or steadying assistance (Pt > 75%) Assist level: Touching or steadying assistance (Pt > 75%) Assist level: Touching or steadying assistance (Pt > 75%) Walk 150 feet activity did not occur: Safety/medical concerns Assist level: Moderate assist (Pt 50 - 74%) Walk 10 feet on uneven surfaces activity did not occur: Safety/medical concerns  Function - Comprehension Comprehension: Auditory Comprehension assist level: Understands basic 50 - 74% of the time/ requires cueing 25 - 49% of the time  Function - Expression Expression: Verbal Expression assist level: Expresses basic 50 - 74% of the time/requires cueing 25 - 49% of the time. Needs to repeat parts of sentences.  Function - Social Interaction Social Interaction assist level: Interacts appropriately 50 - 74% of the time - May be physically or verbally inappropriate.  Function - Problem Solving Problem solving assist level: Solves basic 50 - 74% of the time/requires cueing 25 - 49% of the time  Function - Memory Memory assist level: Recognizes or recalls 50 - 74% of  the time/requires cueing 25 - 49% of the time Patient normally able to recall (first 3 days only): Current season, That he or she is in a hospital, Staff names and faces  Medical Problem List and Plan: 1.  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  DVT Prophylaxis/Anticoagulation: Eliquis 3. Pain Management: Tylenol as needed 4. Mood: Cont Remeron 7.5 mg daily at bedtime  5. Neuropsych: This patient is capable of making decisions on her own behalf. 6. Skin/Wound Care: Routine skin checks 7. Fluids/Electrolytes/Nutrition: Routine I&Os 8. Hypertension. Coreg 12.5 mg twice a day, verapamil 240 mg daily at bedtime increase to318m , Cozaar 100 mg daily  HCTZ increased to 12.5 on 4/4  ?Improving 9. Atrial fibrillation. Plan is currently to continue on Eliquis, rate controlled. Monitor on increased verapamil 10. Tobacco abuse. Counseling 11. Hyperlipidemia. Zocor 12. ABLA  Hb 9.8 on 4/5 13. CKD  Now with AKI  Cr 1.47 on 4/5   Encourage fluids  LOS (Days) 8 A FACE TO FACE EVALUATION WAS PERFORMED  Christina [name] ALorie Phenix4/04/2017, 8:49 AM

## 2017-03-27 NOTE — Progress Notes (Signed)
Speech Language Pathology Daily Session Note  Patient Details  Name: Christina [name] MRN: [id number] Date of Birth: 01-31-1946  Today's Date: 03/27/2017 SLP Individual Time: 1000-1100 SLP Individual Time Calculation (min): 60 min  Short Term Goals: Week 2: SLP Short Term Goal 1 (Week 2): Patient will utilize external aids for orientation to place, time and siutation with Mod A verbal and visual cues.  SLP Short Term Goal 2 (Week 2): Patient will demonstrate sustained attention to a functional task for 15 minutes with Min A verbal cues.  SLP Short Term Goal 3 (Week 2): Patient will demonstrate functional problem solving for basic and familiar tasks with Mod A multimodal cues.  SLP Short Term Goal 4 (Week 2): Patient will identify 1 physical and 1 cognitive changes with Mod A verbal cues.   Skilled Therapeutic Interventions: Skilled treatment session focused on cognition goals. SLP facilitated session by providing Min A to identify safety awareness cards and describing safety card. Pt with increased sustained attention to session and decreased emotional liability. Pt required supervision to sustain attention for ~ 45 minutes and Mod A to learn novel game ("Call it"). Pt was returned to room, left upright in wheelchair, safety belt donned and chair alarm set. Continue per current plan of care.       Function:    Cognition Comprehension Comprehension assist level: Understands basic 50 - 74% of the time/ requires cueing 25 - 49% of the time  Expression   Expression assist level: Expresses basic 50 - 74% of the time/requires cueing 25 - 49% of the time. Needs to repeat parts of sentences.  Social Interaction Social Interaction assist level: Interacts appropriately 50 - 74% of the time - May be physically or verbally inappropriate.  Problem Solving Problem solving assist level: Solves basic 50 - 74% of the time/requires cueing 25 - 49% of the time  Memory Memory assist level: Recognizes or recalls  50 - 74% of the time/requires cueing 25 - 49% of the time    Pain    Therapy/Group: Individual Therapy  Christina [name] 03/27/2017, 12:42 PM

## 2017-03-27 NOTE — Progress Notes (Signed)
Physical Therapy Weekly Progress Note  Patient Details  Name: Christina [name] MRN: [id number] Date of Birth: [birth year]  Beginning of progress report period: March 20, 2017 End of progress report period: March 27, 2017  Today's Date: 03/27/2017 PT Individual Time: 1300-1415 PT Individual Time Calculation (min): 75 min   Patient has met 4 of 4 short term goals.  Patient making excellent progress this reporting period requiring supervision-min A overall with automatic, functional tasks with max multimodal cues for sustained attention, basic problem solving, safety awareness, and overall sequencing and technique. Patient will benefit from extensive family training in preparation for discharge home with 24/7 supervision due to cognitive impairments and apraxia.   Patient continues to demonstrate the following deficits muscle weakness, muscle joint tightness and muscle paralysis, decreased cardiorespiratoy endurance, impaired timing and sequencing, abnormal tone, unbalanced muscle activation, motor apraxia, decreased coordination and decreased motor planning, decreased midline orientation and decreased attention to left, decreased initiation, decreased attention, decreased awareness, decreased problem solving, decreased safety awareness, decreased memory and delayed processing and decreased standing balance, decreased postural control, hemiplegia and decreased balance strategies and therefore will continue to benefit from skilled PT intervention to increase functional independence with mobility.  Patient progressing toward long term goals. Continue plan of care.  PT Short Term Goals Week 1:  PT Short Term Goal 1 (Week 1): Patient will perform bed mobility with min A and max multimodal cues.  PT Short Term Goal 1 - Progress (Week 1): Met PT Short Term Goal 2 (Week 1): Patient will perform transfers with min A and max multimodal cues.  PT Short Term Goal 2 - Progress (Week 1): Met PT Short Term Goal 3  (Week 1): Patient will ambulate 150 ft with assist of 1 person.  PT Short Term Goal 3 - Progress (Week 1): Met PT Short Term Goal 4 (Week 1): Patient will maintain standing balance during functional task x 3 min with min A.  PT Short Term Goal 4 - Progress (Week 1): Met Week 2:  PT Short Term Goal 1 (Week 2): = LTGs due to anticipated LOS  Skilled Therapeutic Interventions/Progress Updates:   Patient in bed, initially refusing therapy stating that her leg was tired. With encouragement and more than reasonable time, patient eventually initiated sitting EOB with HOB raised and use of rail with max multimodal cues for sequencing and technique and min A to bring LLE off bed. Sit <> stand and stand pivot transfers using RW with supervision and max cues for hand placement and technique. Gait training using RW with greatly increased time x 190 ft with close supervision and max verbal cues for attention to task. Patient demonstrated improved upright posture, improved safety with RW, and kept body facing forward instead of rotated compared to yesterday's session. 10 MWT using RW =  0.22 m/s. Performed NuStep using BUE/BLE at level 4 x 10 min with improved pace and attention to task. Stair training up/down 12 stairs using 2 rails with step-to or reciprocal pattern with close supervision and more than reasonable time, max verbal cues for sequencing and technique, advancing LUE along rail, and placing L foot fully on step. Patient performed stand pivot transfer back to bed using RW with supervision and max cues for sequencing and backing up to bed before sitting. Patient required heavy min A to lift LLE onto bed and max verbal cues for sequencing and technique. Patient repositioned higher in bed and left semi reclined with all needs in reach and bed alarm on.  At end of session, patient reporting that, "I did good today and I will do better tomorrow," with improved attitude and willingness to participate with decreased  c/o pain and fatigue after initial refusal.   Therapy Documentation Precautions:  Precautions Precautions: Fall Restrictions Weight Bearing Restrictions: No Pain: Pain Assessment Pain Assessment: No/denies painDenies pain   See Function Navigator for Current Functional Status.  Therapy/Group: Individual Therapy  Margean Korell, Murray Hodgkins 03/27/2017, 5:19 PM

## 2017-03-27 NOTE — Progress Notes (Signed)
Occupational Therapy Weekly Progress Note  Patient Details  Name: Christina [name] MRN: [id number] Date of Birth: 06-23-1946  Beginning of progress report period: March 20, 2017 End of progress report period: March 27, 2017  Today's Date: 03/27/2017 OT Individual Time: 6808-8110 OT Individual Time Calculation (min): 60 min    Patient has met 4 of 4 short term goals.  Pt has made excellent progress this last week with her ability to sit to stand with improved midline posture, stand pivoting with improved awareness of LLE and ability to advance LLE, improved LUE awareness so she is bathing and dressing L side of her body.  She states she continues to feel upset about having to be in the hospital but was very compliant working with therapy today.  Patient continues to demonstrate the following deficits: muscle weakness, motor apraxia, left side neglect, decreased attention, decreased awareness, decreased problem solving, decreased safety awareness, decreased memory and delayed processing and decreased standing balance, decreased postural control, hemiplegia and decreased balance strategies and therefore will continue to benefit from skilled OT intervention to enhance overall performance with BADL.  Patient progressing toward long term goals..  Continue plan of care.  OT Short Term Goals Week 1:  OT Short Term Goal 1 (Week 1): Pt will demonstrate improved awareness of L side by donning shirt over LUE with min cues. OT Short Term Goal 1 - Progress (Week 1): Met OT Short Term Goal 2 (Week 1): Pt will demonstrate improved standing balance to stand with UE support with min A as she pulls pants over her hips. OT Short Term Goal 2 - Progress (Week 1): Met OT Short Term Goal 3 (Week 1): Pt will demonstrate improved praxis by completing a stand pivot transfer to her L with min to mod A.  OT Short Term Goal 3 - Progress (Week 1): Met OT Short Term Goal 4 (Week 1): Pt will be able to self cleanse post  toileting with steadying assist and 2-3 verbal cues.  OT Short Term Goal 4 - Progress (Week 1): Met Week 2:  OT Short Term Goal 1 (Week 2): Pt will be able to donn pants over L foot with min cues demonstrating improved praxis. OT Short Term Goal 2 (Week 2): Pt will be able to pull pants over her hips with steadying A for balance.  OT Short Term Goal 3 (Week 2): Pt will shower with min A. OT Short Term Goal 4 (Week 2): Pt will stand pivot to tub bench with steadying A and less than 25% cues for technique.  Skilled Therapeutic Interventions/Progress Updates:    Pt seen this session to facilitate functional mobility, balance and L side awareness.  Used back to back repetitions (6x in a row)  to enhance motor memory of sit to stand skills and stand pivot transfers from w/c >< bed to prepare for ADL training. Pt was able to do both movement patterns with multimodal cues to attend to LLE with stepping and advancing foot. She has more difficulty with stepping foot backwards and min A to steady balance.  w/c taken into bathroom and pt completed stand pivot to toilet with bar min A but cues to not reach her R arm across her body to grab the bar. Pt practiced the transfer 2 more times to ensure that she would be able to do it with nursing so the [facility] lift would not be needed.  Pt did not need to toilet and refused bathing due to feeling cold.  Changed  clothing with improved praxis for UB dressing. She continues to have great difficulty donning pants over feet.  A to pull over hips as pants were fairly tight.   Pt taken to gym to practice stand pivots w/c >< mat with RW with increased cuing to turn and manipulate the walker, repeated transfer 2 more times with min A but mod-max cues. Pt taken back to room. Quick release belt applied and pillow case placed over belt to decrease anxiety with pt wearing the belt.  Chair alarm on and call light in reach.    Therapy Documentation Precautions:   Precautions Precautions: Fall Restrictions Weight Bearing Restrictions: No      Pain: no c/o pain   ADL: ADL ADL Comments: refer to functional navigator  See Function Navigator for Current Functional Status.   Therapy/Group: Individual Therapy  [place] 03/27/2017, 12:22 PM

## 2017-03-28 ENCOUNTER — Inpatient Hospital Stay (HOSPITAL_COMMUNITY): Payer: Medicaid Other | Admitting: Speech Pathology

## 2017-03-28 ENCOUNTER — Inpatient Hospital Stay (HOSPITAL_COMMUNITY): Payer: Medicaid Other | Admitting: Occupational Therapy

## 2017-03-28 ENCOUNTER — Inpatient Hospital Stay (HOSPITAL_COMMUNITY): Payer: Self-pay | Admitting: Physical Therapy

## 2017-03-28 ENCOUNTER — Inpatient Hospital Stay (HOSPITAL_COMMUNITY): Payer: Medicaid Other | Admitting: Physical Therapy

## 2017-03-28 DIAGNOSIS — R0989 Other specified symptoms and signs involving the circulatory and respiratory systems: Secondary | ICD-10-CM

## 2017-03-28 NOTE — Progress Notes (Signed)
Physical Therapy Session Note  Patient Details  Name: Christina [name] MRN: [id number] Date of Birth: [birth year]  Today's Date: 03/28/2017 PT Individual Time: 1300-1330 PT Individual Time Calculation (min): 30 min   Short Term Goals: Week 1:  PT Short Term Goal 1 (Week 1): Patient will perform bed mobility with min A and max multimodal cues.  PT Short Term Goal 1 - Progress (Week 1): Met PT Short Term Goal 2 (Week 1): Patient will perform transfers with min A and max multimodal cues.  PT Short Term Goal 2 - Progress (Week 1): Met PT Short Term Goal 3 (Week 1): Patient will ambulate 150 ft with assist of 1 person.  PT Short Term Goal 3 - Progress (Week 1): Met PT Short Term Goal 4 (Week 1): Patient will maintain standing balance during functional task x 3 min with min A.  PT Short Term Goal 4 - Progress (Week 1): Met  Skilled Therapeutic Interventions/Progress Updates:    Pt just lying down with nursing assist upon PT arrival. Pt willing to participate with encouragement. Bed Mobility: cues and physical assist need for supine<>sitting. Pt attempting to pull on therapist to come to sitting. Encouraging use of rail. Ambulation performed from room<>dayroom with seated rest break taken. Verbal and tactile cues for neutral pelvis and avoiding trunk rotation. Cues also provided for posture due to tendency for flexed trunk. Balance: in standing working on weight shift in multiple planes and standing posture. Pt returned to room and into bed. Bed alarm on and call light in reach.   Therapy Documentation Precautions:  Precautions Precautions: Fall Restrictions Weight Bearing Restrictions: No   Pain:  Reports pain in Lt knee, monitored during session.   See Function Navigator for Current Functional Status.   Therapy/Group: Individual Therapy  Linard Millers, PT 03/28/2017, 4:05 PM

## 2017-03-28 NOTE — Progress Notes (Signed)
Speech Language Pathology Daily Session Note  Patient Details  Name: Christina [name] MRN: [id number] Date of Birth: [birth year]  Today's Date: 03/28/2017 SLP Individual Time: 0730-0830 SLP Individual Time Calculation (min): 60 min  Short Term Goals: Week 2: SLP Short Term Goal 1 (Week 2): Patient will utilize external aids for orientation to place, time and siutation with Mod A verbal and visual cues.  SLP Short Term Goal 2 (Week 2): Patient will demonstrate sustained attention to a functional task for 15 minutes with Min A verbal cues.  SLP Short Term Goal 3 (Week 2): Patient will demonstrate functional problem solving for basic and familiar tasks with Mod A multimodal cues.  SLP Short Term Goal 4 (Week 2): Patient will identify 1 physical and 1 cognitive changes with Mod A verbal cues.   Skilled Therapeutic Interventions: Skilled treatment session focused on cognition goals. SLP facilitated session by providing supervision cues for completion of transfer to toilet and for brushing teeth/washing face. Pt able to navigate items and condiments on her breakfast tray with supervision cues. Pt completed PEG designs tasks with Min for complete of semi-complex designs with Min A verbal cues d/t left visual deficits. Pt with increased sustained attention to task for ~ 30 minutes with Min A verbal cues. Pt returned to room, left upright in wheelchair, safety belt donned and all needs within reach, daughter present. Continue per current plan of care.      Function:  Eating Eating   Modified Consistency Diet: No Eating Assist Level: More than reasonable amount of time           Cognition Comprehension Comprehension assist level: Understands basic 50 - 74% of the time/ requires cueing 25 - 49% of the time  Expression   Expression assist level: Expresses basic 75 - 89% of the time/requires cueing 10 - 24% of the time. Needs helper to occlude trach/needs to repeat words.  Social Interaction Social  Interaction assist level: Interacts appropriately 50 - 74% of the time - May be physically or verbally inappropriate.  Problem Solving Problem solving assist level: Solves basic 75 - 89% of the time/requires cueing 10 - 24% of the time  Memory Memory assist level: Recognizes or recalls 25 - 49% of the time/requires cueing 50 - 75% of the time    Pain    Therapy/Group: Individual Therapy  Jaylanni Eltringham 03/28/2017, 8:34 AM

## 2017-03-28 NOTE — Progress Notes (Signed)
Physical Therapy Session Note  Patient Details  Name: Christina [name] MRN: [id number] Date of Birth: [birth year]  Today's Date: 03/28/2017 PT Individual Time: 1500-1605 PT Individual Time Calculation (min): 65 min   Short Term Goals: Week 2:  PT Short Term Goal 1 (Week 2): = LTGs due to anticipated LOS  Skilled Therapeutic Interventions/Progress Updates:   Patient awake in bed, reporting need to toilet. Patient required max multimodal cues for sequencing bed mobility and heavy min A to bring LLE off bed with more than reasonable time and total cues to release LUE grasp from bed rail on R side of patient in order to elevate trunk. Sit > stand and gait to and from bathroom using RW with supervision and max verbal cues for keeping BLE inside RW, avoiding obstacles on R, and sequencing turns. Patient performed toilet transfer, hygiene, and clothing management with supervision and extra time. Standing at sink, patient required max verbal/visual cues for sequencing hand hygiene and utilizing BUE with supervision for balance. Gait training from room to [facility] gym using RW with supervision and max verbal cues for attention to task as patient easily externally distracted. In quiet environment with minimal distractions, patient performed Dynavision in standing with intermittent UE support on board for stability with supervision. Patient completed 3 trials in standing with spontaneous use of BUE of 3 min each with min-mod verbal/visual cues throughout trials for sustained attention to task and visual scanning in all quadrants: 43 hits, 64 hits, and 59 hits respectively. Patient performed sit <> stand from wheelchair x 3 without AD and max verbal/visual cues for anterior weight shift and releasing grasp from arm rest. Performed stand pivot transfer wheelchair <> NuStep without AD with min A overall and max multimodal cues for sequencing turn and hand placement. Performed NuStep using BUE/BLE at level 4 x 10 min for L NMR  and reciprocal pattern retraining. Patient's wheelchair switched out for 16 x 16 wheelchair with back support for improved positioning and sitting tolerance. Throughout session, patient required max verbal/visual cues to attend to LUE grasp in order to release objects with patient hitting LUE with RUE and scolding her hand for not behaving. Patient left sitting in wheelchair with quick release belt and chair alarm on and all needs in reach. Patient with improved mood and participation this date.   Therapy Documentation Precautions:  Precautions Precautions: Fall Restrictions Weight Bearing Restrictions: No Pain: Pain Assessment Pain Assessment: Faces Faces Pain Scale: Hurts little more Pain Type: Acute pain Pain Location: Knee Pain Orientation: Left Pain Descriptors / Indicators: Aching Pain Onset: With Activity Pain Intervention(s): Repositioned;Rest  See Function Navigator for Current Functional Status.   Therapy/Group: Individual Therapy  Deegan Valentino, Prudencio Pair 03/28/2017, 4:09 PM

## 2017-03-28 NOTE — Progress Notes (Signed)
Physical Therapy Session Note  Patient Details  Name: Christina [name] MRN: [id number] Date of Birth: 1946-09-08  Today's Date: 03/28/2017 PT Individual Time: 1115 (make up time)-1149 PT Individual Time Calculation (min): 34 min   Short Term Goals: Week 2:  PT Short Term Goal 1 (Week 2): = LTGs due to anticipated LOS  Skilled Therapeutic Interventions/Progress Updates:    Pt seen for make up time.  No c/o pain but does report fatigue and soreness in lower back near end of session.  Session focus on attention to task, sequencing, initiation, and safety with ADLs and functional mobility.  Pt transfers from w/c and toilet with supervision using UE support on armrests/grab bar.  Gait within room and x75' in hallway with RW and supervision, pt demos L trunk rotation, step to pattern as if side stepping to the R for ambulation, improves with facilitation for R trunk posterior rotation and L pelvis anterior rotation.  Pt toileted with assist for 2/3 steps for time management.  Pt requires more than a reasonable amount of time and increased cues for all tasks during session due to decreased attention, problem solving, and initiation.    Therapy Documentation Precautions:  Precautions Precautions: Fall Restrictions Weight Bearing Restrictions: No   See Function Navigator for Current Functional Status.   Therapy/Group: Individual Therapy  Retal Tonkinson E Penven-Crew 03/28/2017, 11:55 AM

## 2017-03-28 NOTE — Progress Notes (Signed)
Subjective/Complaints: Pt seen sitting up in her chair this AM.  She states she slept "beautifully".   ROS; Denies CP, SOB, N/V/D  Objective: Vital Signs: Blood pressure (!) 174/62, pulse (!) 59, temperature 98.5 F (36.9 C), temperature source Oral, resp. rate 18, weight 65.2 kg (143 lb 13.6 oz), SpO2 100 %. No results found. Results for orders placed or performed during the hospital encounter of 03/19/17 (from the past 72 hour(s))  CBC with Differential/Platelet     Status: Abnormal   Collection Time: 03/27/17  5:48 AM  Result Value Ref Range   WBC 5.4 4.0 - 10.5 K/uL   RBC 3.56 (L) 3.87 - 5.11 MIL/uL   Hemoglobin 9.8 (L) 12.0 - 15.0 g/dL   HCT 30.9 (L) 36.0 - 46.0 %   MCV 86.8 78.0 - 100.0 fL   MCH 27.5 26.0 - 34.0 pg   MCHC 31.7 30.0 - 36.0 g/dL   RDW 14.0 11.5 - 15.5 %   Platelets 193 150 - 400 K/uL   Neutrophils Relative % 57 %   Neutro Abs 3.1 1.7 - 7.7 K/uL   Lymphocytes Relative 22 %   Lymphs Abs 1.2 0.7 - 4.0 K/uL   Monocytes Relative 10 %   Monocytes Absolute 0.5 0.1 - 1.0 K/uL   Eosinophils Relative 10 %   Eosinophils Absolute 0.6 0.0 - 0.7 K/uL   Basophils Relative 1 %   Basophils Absolute 0.0 0.0 - 0.1 K/uL  Basic metabolic panel     Status: Abnormal   Collection Time: 03/27/17  5:48 AM  Result Value Ref Range   Sodium 140 135 - 145 mmol/L   Potassium 3.7 3.5 - 5.1 mmol/L   Chloride 105 101 - 111 mmol/L   CO2 26 22 - 32 mmol/L   Glucose, Bld 100 (H) 65 - 99 mg/dL   BUN 23 (H) 6 - 20 mg/dL   Creatinine, Ser 1.47 (H) 0.44 - 1.00 mg/dL   Calcium 9.5 8.9 - 10.3 mg/dL   GFR calc non Af Amer 35 (L) >60 mL/min   GFR calc Af Amer 41 (L) >60 mL/min    Comment: (NOTE) The eGFR has been calculated using the CKD EPI equation. This calculation has not been validated in all clinical situations. eGFR's persistently <60 mL/min signify possible Chronic Kidney Disease.    Anion gap 9 5 - 15     Gen NAD. Vital signs reviewed. HEENT: Normocephalic, atraumatic.   Cardio: RRR. No JVD. Resp: CTA B/L and unlabored GI: BS positive and NT Musc/Skel:  No edema. No tenderness Neuro: Alert/Oriented Motor RUE 5/5 RLE 4/5 HF, 4+/5 KE, ADF/PF LUE 4+/5 LLE 3/5 HF, 4+/5 KE, 2+/5 ADF/PF Skin:   Intact. Warm and dry  Assessment/Plan: 1. Functional deficits secondary to Left MCA which require 3+ hours per day of interdisciplinary therapy in a comprehensive inpatient rehab setting. Physiatrist is providing close team supervision and 24 hour management of active medical problems listed below. Physiatrist and rehab team continue to assess barriers to discharge/monitor patient progress toward functional and medical goals. FIM: Function - Bathing Position: Shower Body parts bathed by patient: Right arm, Left arm, Chest, Abdomen, Front perineal area, Right upper leg, Left upper leg Body parts bathed by helper: Back, Buttocks, Right lower leg, Left lower leg Assist Level: Touching or steadying assistance(Pt > 75%)  Function- Upper Body Dressing/Undressing What is the patient wearing?: Pull over shirt/dress Pull over shirt/dress - Perfomed by patient: Thread/unthread right sleeve, Thread/unthread left sleeve, Put head through opening,  Pull shirt over trunk Pull over shirt/dress - Perfomed by helper: Put head through opening, Pull shirt over trunk Assist Level: Set up Function - Lower Body Dressing/Undressing What is the patient wearing?: Pants Position: Wheelchair/chair at sink Underwear - Performed by patient: Thread/unthread right underwear leg Underwear - Performed by helper: Thread/unthread left underwear leg, Pull underwear up/down Pants- Performed by patient: Thread/unthread right pants leg Pants- Performed by helper: Thread/unthread left pants leg, Pull pants up/down Non-skid slipper socks- Performed by patient: Don/doff left sock Non-skid slipper socks- Performed by helper: Don/doff right sock Assist for footwear: Maximal assist Assist for lower body  dressing: Touching or steadying assistance (Pt > 75%)  Function - Toileting Toileting steps completed by patient: Performs perineal hygiene Toileting steps completed by helper: Adjust clothing prior to toileting, Adjust clothing after toileting Toileting Assistive Devices: Grab bar or rail Assist level: Touching or steadying assistance (Pt.75%)  Function - Air cabin crew transfer assistive device: Radio broadcast assistant lift: Stedy Assist level to toilet: Touching or steadying assistance (Pt > 75%) Assist level from toilet: Touching or steadying assistance (Pt > 75%)  Function - Chair/bed transfer Chair/bed transfer method: Ambulatory Chair/bed transfer assist level: Touching or steadying assistance (Pt > 75%) Chair/bed transfer assistive device: Armrests, Walker Mechanical lift: Stedy Chair/bed transfer details: Verbal cues for technique, Verbal cues for sequencing, Visual cues/gestures for sequencing, Manual facilitation for weight shifting, Tactile cues for placement, Tactile cues for posture, Tactile cues for sequencing  Function - Locomotion: Wheelchair Type: Manual Max wheelchair distance: 25 ft Assist Level: Touching or steadying assistance (Pt > 75%) Turns around,maneuvers to table,bed, and toilet,negotiates 3% grade,maneuvers on rugs and over doorsills: No Function - Locomotion: Ambulation Assistive device: Walker-rolling Max distance: 190 ft Assist level: Supervision or verbal cues Assist level: Supervision or verbal cues Assist level: Supervision or verbal cues Walk 150 feet activity did not occur: Safety/medical concerns Assist level: Supervision or verbal cues Walk 10 feet on uneven surfaces activity did not occur: Safety/medical concerns  Function - Comprehension Comprehension: Auditory Comprehension assist level: Understands basic 50 - 74% of the time/ requires cueing 25 - 49% of the time  Function - Expression Expression: Verbal Expression assist level:  Expresses basic 75 - 89% of the time/requires cueing 10 - 24% of the time. Needs helper to occlude trach/needs to repeat words.  Function - Social Interaction Social Interaction assist level: Interacts appropriately 50 - 74% of the time - May be physically or verbally inappropriate.  Function - Problem Solving Problem solving assist level: Solves basic 75 - 89% of the time/requires cueing 10 - 24% of the time  Function - Memory Memory assist level: Recognizes or recalls 25 - 49% of the time/requires cueing 50 - 75% of the time Patient normally able to recall (first 3 days only): Current season, That he or she is in a hospital, Staff names and faces, Location of own room  Medical Problem List and Plan: 1.  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  DVT Prophylaxis/Anticoagulation: Eliquis 3. Pain Management: Tylenol as needed 4. Mood: Cont Remeron 7.5 mg daily at bedtime  5. Neuropsych: This patient is capable of making decisions on her own behalf. 6. Skin/Wound Care: Routine skin checks 7. Fluids/Electrolytes/Nutrition: Routine I&Os 8. Hypertension. Coreg 12.5 mg twice a day, verapamil 240 mg daily at bedtime increase to 35m , Cozaar 100 mg daily  HCTZ increased to 12.5 on 4/4  Extremely labile, will consider further  medication changes if persistently elevated 9. Atrial fibrillation. Plan is currently to continue on Eliquis, rate controlled. Monitor on increased verapamil 10. Tobacco abuse. Counseling 11. Hyperlipidemia. Zocor, will transition to Lipitor at d/c 12. ABLA  Hb 9.8 on 4/5  Labs ordered for Monday 13. CKD  Now with AKI  Cr 1.47 on 4/5   Labs ordered for Monday  Encourage fluids  LOS (Days) 9 A FACE TO FACE EVALUATION WAS PERFORMED  Christina [name] Christina [name] 03/28/2017, 8:51 AM

## 2017-03-28 NOTE — Progress Notes (Signed)
Occupational Therapy Session Note  Patient Details  Name: Christina [name] MRN: [id number] Date of Birth: June 26, 1946  Today's Date: 03/28/2017 OT Individual Time: 6962-9528 OT Individual Time Calculation (min): 60 min    Short Term Goals: Week 2:  OT Short Term Goal 1 (Week 2): Pt will be able to donn pants over L foot with min cues demonstrating improved praxis. OT Short Term Goal 2 (Week 2): Pt will be able to pull pants over her hips with steadying A for balance.  OT Short Term Goal 3 (Week 2): Pt will shower with min A. OT Short Term Goal 4 (Week 2): Pt will stand pivot to tub bench with steadying A and less than 25% cues for technique.  Skilled Therapeutic Interventions/Progress Updates:    Pt seen for BADL retraining of grooming, toileting, shower, dressing with a focus on L side awareness and strength with functional mobility. Pt expressed a positive mood and was agreeable to a shower. Completed stand pivot w/c to toilet with bar with min A and tactile/verbal cues to fully attend to L foot placement and cues to advance foot. Pt toileted and self cleansed. Used RW to step 4 feet to shower bench.  Pt needed total A to turn RW so she could rotate her body. She had difficulty motor planning a 180 degree turn.  Once at shower, used bar to step in towards her L with min A and cues.  Sat on bench to bathe self and was able to reach her feet. Steady A in standing to reach bottom.  Stand pivot to R back to w/c with only guiding A.  For LB dressing, pt cannot lean forward and lift L foot at the same time. So with mod A to get foot into cross legged position, she placed clothing over L foot, place R foot in and then pulled up underwear and pants over hips 50% of the way.  Sat at sink for grooming. Chair alarm on, quick release belt on.  Discussed pt's progress and she stated she recognizes her progress.  She states she is feeling better about her rehab stay and not as upset about being here.   Therapy  Documentation Precautions:  Precautions Precautions: Fall Restrictions Weight Bearing Restrictions: No   Pain: Pain Assessment Pain Assessment: 0-10 Pain Score: 3  Pain Type: Acute pain Pain Location: Knee Pain Orientation: Left Pain Descriptors / Indicators: Aching Pain Onset: Gradual Pain Intervention(s): RN made aware ADL: ADL ADL Comments: refer to functional navigator  See Function Navigator for Current Functional Status.   Therapy/Group: Individual Therapy  SAGUIER,JULIA 03/28/2017, 9:17 AM

## 2017-03-29 ENCOUNTER — Inpatient Hospital Stay (HOSPITAL_COMMUNITY): Payer: Medicaid Other | Admitting: Occupational Therapy

## 2017-03-29 ENCOUNTER — Inpatient Hospital Stay (HOSPITAL_COMMUNITY): Payer: Self-pay

## 2017-03-29 ENCOUNTER — Inpatient Hospital Stay (HOSPITAL_COMMUNITY): Payer: Medicaid Other

## 2017-03-29 DIAGNOSIS — I480 Paroxysmal atrial fibrillation: Secondary | ICD-10-CM

## 2017-03-29 NOTE — Progress Notes (Signed)
Physical Therapy Session Note  Patient Details  Name: Christina [name] MRN: [id number] Date of Birth: 01-Mar-1946  Today's Date: 03/29/2017 PT Individual Time: 1335-1430 PT Individual Time Calculation (min): 55 min   Short Term Goals: Week 2:  PT Short Term Goal 1 (Week 2): = LTGs due to anticipated LOS  Skilled Therapeutic Interventions/Progress Updates:    Session focused on functional endurance, sit <> stands, gait training with RW with focus on upright posture (significantly flexed posture, poor foot clearance, and decreased safety with RW), and cognitive remediation. Pt requires mod cues for problem solving and sequencing with all mobility. Min physical assist overall and extra time for all tasks due to perceptual and attention impairments. End of session returned back to bed with all needs in reach and bed alarm intact.   Therapy Documentation Precautions:  Precautions Precautions: Fall Restrictions Weight Bearing Restrictions: No    Pain: Pt just received pain medication for back pain. Pt did not rate but states she is feeling awful today because of it.   See Function Navigator for Current Functional Status.   Therapy/Group: Individual Therapy  Karolee Stamps Darrol Poke, PT, DPT  03/29/2017, 3:26 PM

## 2017-03-29 NOTE — Progress Notes (Signed)
Occupational Therapy Session Note  Patient Details  Name: Christina [name] MRN: [id number] Date of Birth: [birth year]  Today's Date: 03/29/2017 OT Individual Time: 4098-1191 OT Individual Time Calculation (min): 60 min    Short Term Goals: Week 2:  OT Short Term Goal 1 (Week 2): Pt will be able to donn pants over L foot with min cues demonstrating improved praxis. OT Short Term Goal 2 (Week 2): Pt will be able to pull pants over her hips with steadying A for balance.  OT Short Term Goal 3 (Week 2): Pt will shower with min A. OT Short Term Goal 4 (Week 2): Pt will stand pivot to tub bench with steadying A and less than 25% cues for technique.  Skilled Therapeutic Interventions/Progress Updates:    Upon entering the room, pt seated in wheelchair with no c/o pain this session but requesting to go to bathroom and then shower. Pt propelled wheelchair to dresser and needing mod verbal cues to problem solve all needed items for dressing task. Pt transferred from wheelchair to toilet with min A for balance with mod cues for sequencing, safety awareness, and L LE placement during transfer. Pt removed clothing items while seated on toilet and ambulated 4' without use of RW and min A to TTB for shower. Pt bathing with overall steady assist in shower and mod cues for initiation and sequencing of tasks. Hand over hand needed to incorporate L UE into functional tasks this session. Dressing from wheelchair at sink with min A for sit <> stand and max cues for hemiplegic dressing techniques. Pt remained in wheelchair at end of session with chair alarm activated and quick release belt donned. Call bell and all needed items within reach upon exiting the room.   Therapy Documentation Precautions:  Precautions Precautions: Fall Restrictions Weight Bearing Restrictions: No   Pain: Pain Assessment Pain Assessment: 0-10 Pain Score: 6  Pain Type: Acute pain Pain Location: Back Pain Orientation: Lower Pain  Descriptors / Indicators: Aching Pain Frequency: Intermittent Pain Onset: With Activity Patients Stated Pain Goal: 0 Pain Intervention(s): Medication (See eMAR) Multiple Pain Sites: No ADL: ADL ADL Comments: refer to functional navigator Exercises:   Other Treatments:    See Function Navigator for Current Functional Status.   Therapy/Group: Individual Therapy  Alen Bleacher 03/29/2017, 2:11 PM

## 2017-03-29 NOTE — Progress Notes (Signed)
Subjective/Complaints: Pt seen laying in bed this AM.  She slept well overnight.    ROS: Denies CP, SOB, N/V/D  Objective: Vital Signs: Blood pressure 140/65, pulse 97, temperature 98.7 F (37.1 C), temperature source Oral, resp. rate 18, weight 65.2 kg (143 lb 13.6 oz), SpO2 100 %. No results found. Results for orders placed or performed during the hospital encounter of 03/19/17 (from the past 72 hour(s))  CBC with Differential/Platelet     Status: Abnormal   Collection Time: 03/27/17  5:48 AM  Result Value Ref Range   WBC 5.4 4.0 - 10.5 K/uL   RBC 3.56 (L) 3.87 - 5.11 MIL/uL   Hemoglobin 9.8 (L) 12.0 - 15.0 g/dL   HCT 30.9 (L) 36.0 - 46.0 %   MCV 86.8 78.0 - 100.0 fL   MCH 27.5 26.0 - 34.0 pg   MCHC 31.7 30.0 - 36.0 g/dL   RDW 14.0 11.5 - 15.5 %   Platelets 193 150 - 400 K/uL   Neutrophils Relative % 57 %   Neutro Abs 3.1 1.7 - 7.7 K/uL   Lymphocytes Relative 22 %   Lymphs Abs 1.2 0.7 - 4.0 K/uL   Monocytes Relative 10 %   Monocytes Absolute 0.5 0.1 - 1.0 K/uL   Eosinophils Relative 10 %   Eosinophils Absolute 0.6 0.0 - 0.7 K/uL   Basophils Relative 1 %   Basophils Absolute 0.0 0.0 - 0.1 K/uL  Basic metabolic panel     Status: Abnormal   Collection Time: 03/27/17  5:48 AM  Result Value Ref Range   Sodium 140 135 - 145 mmol/L   Potassium 3.7 3.5 - 5.1 mmol/L   Chloride 105 101 - 111 mmol/L   CO2 26 22 - 32 mmol/L   Glucose, Bld 100 (H) 65 - 99 mg/dL   BUN 23 (H) 6 - 20 mg/dL   Creatinine, Ser 1.47 (H) 0.44 - 1.00 mg/dL   Calcium 9.5 8.9 - 10.3 mg/dL   GFR calc non Af Amer 35 (L) >60 mL/min   GFR calc Af Amer 41 (L) >60 mL/min    Comment: (NOTE) The eGFR has been calculated using the CKD EPI equation. This calculation has not been validated in all clinical situations. eGFR's persistently <60 mL/min signify possible Chronic Kidney Disease.    Anion gap 9 5 - 15     Gen NAD. Vital signs reviewed. HEENT: Normocephalic, atraumatic.  Cardio: RRR. No JVD. Resp: CTA  B/L and Unlabored GI: BS positive and NT Musc/Skel:  No edema. No tenderness Neuro: Alert/Oriented Motor RUE 5/5 RLE 4/5 HF, 4+/5 KE, ADF/PF LUE 4+/5 LLE 3/5 HF, 4+/5 KE, 2/5 ADF/PF Skin:   Intact. Warm and dry  Assessment/Plan: 1. Functional deficits secondary to Left MCA which require 3+ hours per day of interdisciplinary therapy in a comprehensive inpatient rehab setting. Physiatrist is providing close team supervision and 24 hour management of active medical problems listed below. Physiatrist and rehab team continue to assess barriers to discharge/monitor patient progress toward functional and medical goals. FIM: Function - Bathing Position: Shower Body parts bathed by patient: Right arm, Left arm, Chest, Abdomen, Front perineal area, Right upper leg, Left upper leg, Right lower leg, Left lower leg, Buttocks Body parts bathed by helper: Back Assist Level: Touching or steadying assistance(Pt > 75%)  Function- Upper Body Dressing/Undressing What is the patient wearing?: Pull over shirt/dress Pull over shirt/dress - Perfomed by patient: Thread/unthread right sleeve, Thread/unthread left sleeve, Put head through opening, Pull shirt over trunk  Pull over shirt/dress - Perfomed by helper: Put head through opening, Pull shirt over trunk Assist Level: Set up Set up : To obtain clothing/put away Function - Lower Body Dressing/Undressing What is the patient wearing?: Underwear, Pants, Non-skid slipper socks Position: Wheelchair/chair at sink Underwear - Performed by patient: Thread/unthread right underwear leg Underwear - Performed by helper: Thread/unthread left underwear leg, Pull underwear up/down Pants- Performed by patient: Thread/unthread right pants leg Pants- Performed by helper: Thread/unthread right pants leg, Thread/unthread left pants leg, Pull pants up/down Non-skid slipper socks- Performed by patient: Don/doff left sock Non-skid slipper socks- Performed by helper: Don/doff  right sock Assist for footwear: Maximal assist Assist for lower body dressing:  (max A)  Function - Toileting Toileting steps completed by patient: Adjust clothing after toileting, Performs perineal hygiene Toileting steps completed by helper: Adjust clothing prior to toileting Toileting Assistive Devices: Grab bar or rail Assist level:  (mod A)  Function - Air cabin crew transfer assistive device: Radio broadcast assistant lift: Stedy Assist level to toilet: Touching or steadying assistance (Pt > 75%) Assist level from toilet: Touching or steadying assistance (Pt > 75%)  Function - Chair/bed transfer Chair/bed transfer method: Stand pivot Chair/bed transfer assist level: Touching or steadying assistance (Pt > 75%) Chair/bed transfer assistive device: Armrests, Walker Mechanical lift: Stedy Chair/bed transfer details: Verbal cues for technique, Verbal cues for sequencing, Visual cues/gestures for sequencing, Manual facilitation for weight shifting, Tactile cues for placement, Tactile cues for posture, Tactile cues for sequencing, Verbal cues for safe use of DME/AE  Function - Locomotion: Wheelchair Type: Manual Max wheelchair distance: 25 ft Assist Level: Touching or steadying assistance (Pt > 75%) Turns around,maneuvers to table,bed, and toilet,negotiates 3% grade,maneuvers on rugs and over doorsills: No Function - Locomotion: Ambulation Assistive device: Walker-rolling Max distance: 120' Assist level: Touching or steadying assistance (Pt > 75%) Assist level: Touching or steadying assistance (Pt > 75%) Assist level: Touching or steadying assistance (Pt > 75%) Walk 150 feet activity did not occur: Safety/medical concerns Assist level: Supervision or verbal cues Walk 10 feet on uneven surfaces activity did not occur: Safety/medical concerns  Function - Comprehension Comprehension: Auditory Comprehension assist level: Understands basic 50 - 74% of the time/ requires cueing 25  - 49% of the time  Function - Expression Expression: Verbal Expression assist level: Expresses basic 75 - 89% of the time/requires cueing 10 - 24% of the time. Needs helper to occlude trach/needs to repeat words.  Function - Social Interaction Social Interaction assist level: Interacts appropriately 50 - 74% of the time - May be physically or verbally inappropriate.  Function - Problem Solving Problem solving assist level: Solves basic 50 - 74% of the time/requires cueing 25 - 49% of the time  Function - Memory Memory assist level: Recognizes or recalls 25 - 49% of the time/requires cueing 50 - 75% of the time Patient normally able to recall (first 3 days only): Current season, That he or she is in a hospital, Staff names and faces, Location of own room  Medical Problem List and Plan: 1.  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  DVT Prophylaxis/Anticoagulation: Eliquis 3. Pain Management: Tylenol as needed 4. Mood: Cont Remeron 7.5 mg daily at bedtime  5. Neuropsych: This patient is capable of making decisions on her own behalf. 6. Skin/Wound Care: Routine skin checks 7. Fluids/Electrolytes/Nutrition: Routine I&Os 8. Hypertension. Coreg 12.5 mg twice a day, verapamil 240 mg daily at bedtime  increase to 332m , Cozaar 100 mg daily  HCTZ increased to 12.5 on 4/4  Relatively controlled 4/7 9. Atrial fibrillation. Plan is currently to continue on Eliquis, rate controlled 4/7.   Monitor on increased verapamil 10. Tobacco abuse. Counseling 11. Hyperlipidemia. Zocor, will transition to Lipitor at d/c 12. ABLA  Hb 9.8 on 4/5  Labs ordered for Monday 13. CKD  Now with AKI  Cr 1.47 on 4/5   Labs ordered for Monday  Encourage fluids  LOS (Days) 10 A FACE TO FACE EVALUATION WAS PERFORMED  Ankit ALorie Phenix4/06/2017, 5:00 PM

## 2017-03-30 ENCOUNTER — Inpatient Hospital Stay (HOSPITAL_COMMUNITY): Payer: Self-pay | Admitting: Physical Therapy

## 2017-03-30 DIAGNOSIS — G479 Sleep disorder, unspecified: Secondary | ICD-10-CM

## 2017-03-30 DIAGNOSIS — G8190 Hemiplegia, unspecified affecting unspecified side: Secondary | ICD-10-CM

## 2017-03-30 DIAGNOSIS — I4891 Unspecified atrial fibrillation: Secondary | ICD-10-CM

## 2017-03-30 DIAGNOSIS — M1710 Unilateral primary osteoarthritis, unspecified knee: Secondary | ICD-10-CM

## 2017-03-30 MED ORDER — LIDOCAINE 5 % EX PTCH
1.0000 | MEDICATED_PATCH | CUTANEOUS | Status: DC
  Administered 2017-03-30 – 2017-04-05 (×7): 1 via TRANSDERMAL
  Filled 2017-03-30 (×7): qty 1

## 2017-03-30 MED ORDER — TRAZODONE HCL 50 MG PO TABS
50.0000 mg | ORAL_TABLET | Freq: Every evening | ORAL | Status: DC | PRN
  Administered 2017-04-01: 50 mg via ORAL
  Filled 2017-03-30: qty 1

## 2017-03-30 NOTE — Progress Notes (Signed)
Subjective/Complaints: Patient seen lying in bed this morning. She states she did not sleep well overnight due to pain in her left knee and headache. She states Voltaren gel does not work for her.    ROS: Denies CP, SOB, N/V/D  Objective: Vital Signs: Blood pressure (!) 123/43, pulse (!) 55, temperature 98.7 F (37.1 C), temperature source Oral, resp. rate 18, weight 65.2 kg (143 lb 13.6 oz), SpO2 98 %. No results found. No results found for this or any previous visit (from the past 72 hour(s)).   Gen NAD. Vital signs reviewed. HEENT: Normocephalic, atraumatic.  Cardio: RRR. No JVD. Resp: CTA B/L and unlabored GI: BS positive and NT Musc/Skel:  Left knee TTP Neuro: Alert/Oriented Motor RUE 5/5 RLE 4/5 HF, 4+/5 KE, ADF/PF LUE 4+/5 LLE 3/5 HF, 4+/5 KE, 2/5 ADF/PF Skin:   Intact. Warm and dry  Assessment/Plan: 1. Functional deficits secondary to Left MCA which require 3+ hours per day of interdisciplinary therapy in a comprehensive inpatient rehab setting. Physiatrist is providing close team supervision and 24 hour management of active medical problems listed below. Physiatrist and rehab team continue to assess barriers to discharge/monitor patient progress toward functional and medical goals. FIM: Function - Bathing Position: Shower Body parts bathed by patient: Right arm, Left arm, Chest, Abdomen, Front perineal area, Right upper leg, Left upper leg, Right lower leg, Left lower leg, Buttocks Body parts bathed by helper: Back Assist Level: Touching or steadying assistance(Pt > 75%)  Function- Upper Body Dressing/Undressing What is the patient wearing?: Pull over shirt/dress Pull over shirt/dress - Perfomed by patient: Thread/unthread right sleeve, Thread/unthread left sleeve, Put head through opening, Pull shirt over trunk Pull over shirt/dress - Perfomed by helper: Put head through opening, Pull shirt over trunk Assist Level: Set up Set up : To obtain clothing/put  away Function - Lower Body Dressing/Undressing What is the patient wearing?: Underwear, Pants, Non-skid slipper socks Position: Wheelchair/chair at sink Underwear - Performed by patient: Thread/unthread right underwear leg Underwear - Performed by helper: Thread/unthread left underwear leg, Pull underwear up/down Pants- Performed by patient: Thread/unthread right pants leg Pants- Performed by helper: Thread/unthread right pants leg, Thread/unthread left pants leg, Pull pants up/down Non-skid slipper socks- Performed by patient: Don/doff left sock Non-skid slipper socks- Performed by helper: Don/doff right sock Assist for footwear: Maximal assist Assist for lower body dressing:  (max A)  Function - Toileting Toileting steps completed by patient: Adjust clothing after toileting, Performs perineal hygiene Toileting steps completed by helper: Adjust clothing prior to toileting Toileting Assistive Devices: Grab bar or rail Assist level:  (mod A)  Function - Archivist transfer assistive device: Engineer, civil (consulting) lift: Stedy Assist level to toilet: Touching or steadying assistance (Pt > 75%) Assist level from toilet: Touching or steadying assistance (Pt > 75%)  Function - Chair/bed transfer Chair/bed transfer method: Stand pivot Chair/bed transfer assist level: Touching or steadying assistance (Pt > 75%) Chair/bed transfer assistive device: Armrests, Walker Mechanical lift: Stedy Chair/bed transfer details: Verbal cues for technique, Verbal cues for sequencing, Visual cues/gestures for sequencing, Manual facilitation for weight shifting, Tactile cues for placement, Tactile cues for posture, Tactile cues for sequencing, Verbal cues for safe use of DME/AE  Function - Locomotion: Wheelchair Type: Manual Max wheelchair distance: 25 ft Assist Level: Touching or steadying assistance (Pt > 75%) Turns around,maneuvers to table,bed, and toilet,negotiates 3% grade,maneuvers on rugs  and over doorsills: No Function - Locomotion: Ambulation Assistive device: Walker-rolling Max distance: 120' Assist level: Touching or  steadying assistance (Pt > 75%) Assist level: Touching or steadying assistance (Pt > 75%) Assist level: Touching or steadying assistance (Pt > 75%) Walk 150 feet activity did not occur: Safety/medical concerns Assist level: Supervision or verbal cues Walk 10 feet on uneven surfaces activity did not occur: Safety/medical concerns  Function - Comprehension Comprehension: Auditory Comprehension assist level: Understands basic 50 - 74% of the time/ requires cueing 25 - 49% of the time  Function - Expression Expression: Verbal Expression assist level: Expresses basic 75 - 89% of the time/requires cueing 10 - 24% of the time. Needs helper to occlude trach/needs to repeat words.  Function - Social Interaction Social Interaction assist level: Interacts appropriately 50 - 74% of the time - May be physically or verbally inappropriate.  Function - Problem Solving Problem solving assist level: Solves basic 50 - 74% of the time/requires cueing 25 - 49% of the time  Function - Memory Memory assist level: Recognizes or recalls 25 - 49% of the time/requires cueing 50 - 75% of the time Patient normally able to recall (first 3 days only): Current season, That he or she is in a hospital, Staff names and faces, Location of own room  Medical Problem List and Plan: 1.  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  DVT Prophylaxis/Anticoagulation: Eliquis 3. Pain Management: Tylenol as needed 4. Mood: Cont Remeron 7.5 mg daily at bedtime  5. Neuropsych: This patient is capable of making decisions on her own behalf. 6. Skin/Wound Care: Routine skin checks 7. Fluids/Electrolytes/Nutrition: Routine I&Os 8. Hypertension. Coreg 12.5 mg twice a day, verapamil 240 mg daily at bedtime increase to  , Cozaar 100 mg daily  HCTZ  increased to 12.5 on 4/4  Controlled 4/8 9. Atrial fibrillation. Plan is currently to continue on Eliquis.   Monitor on increased verapamil 10. Tobacco abuse. Counseling 11. Hyperlipidemia. Zocor, will transition to Lipitor at d/c 12. ABLA  Hb 9.8 on 4/5  Labs ordered for Monday 13. CKD  Now with AKI  Cr 1.47 on 4/5   Labs ordered for Monday  Encourage fluids 14. Left knee OA  Imaging reviewed  Voltaren gel d/ced, lidoderm patch ordered  Will consider steroid injection  15. Sleep disturbance  Trazodone when necessary ordered 4/8   LOS (Days) 11 A FACE TO FACE EVALUATION WAS PERFORMED  Ankit Karis Juba 03/30/2017, 7:32 AM

## 2017-03-30 NOTE — Progress Notes (Signed)
Patient moved to [phone] for c/o being cold in her room. Facilities called to have her room checked and he said he is unable to fix it. Dr. Allena Katz also made aware of patient pain to left knee. Tylenol was given with minimal relief. Heating packs offered with some relief. Patient more comfortable now in room 4w05.

## 2017-03-30 NOTE — Progress Notes (Signed)
Nurse tech observed that patient had removed her shoestrings from her shoes. She was holding the shoestrings in her hand and stated, "I'm going to kill myself". This nurse also saw patient with shoestrings in hand and heard this statement. Charge nurse and on call provider notified. Suicide precautions initiated.Pt's daughter notified and is at bedside at present.   Charge nurse had extended conversation with daughter explaining suicide precautions and why this protocol was put into place. Pt's daughter verbalized understanding but feels her mother was displaying attention seeking behavior. Patient in room with daughter and staff nurse at present.

## 2017-03-30 NOTE — Progress Notes (Signed)
Physical Therapy Session Note  Patient Details  Name: Christina [name] MRN: [id number] Date of Birth: Sep 22, 1946  Today's Date: 03/30/2017 PT Individual Time: 1300-1400 PT Individual Time Calculation (min): 60 min   Short Term Goals: Week 2:  PT Short Term Goal 1 (Week 2): = LTGs due to anticipated LOS  Skilled Therapeutic Interventions/Progress Updates: Pt received semi-reclined in bed, denies pain and agreeable to treatment. Pt emotionally labile; reporting "my daughter did this to me, I was doing well before she did this to me" and verbalizing feeling very depressed and upset. Therapist offered emotional support and attempted to redirect pt to therapy tasks and provided education regarding purpose of participation in therapy to improve strength, mobility and prepare to go home. Pt engaged in BUE task of threading shoe laces back into shoes; required max cueing for sequencing and significantly increased time. Supine>sit with minA for LLE management. Stand pivot transfer to w/c with RW and min guard; increased time and mod cues for sequencing. Attempted to engage pt in standing LE exercises however pt reports increase in L knee pain and unable to perform marching or heel raises. Attempted to engage pt in gait training; ambulated a few steps forward from w/c to prepare to turn around and pt reports she needs to use the restroom; seated in recliner while therapist set up BSC. Stand pivot transfer w/c <>BSC d/t urgency; min guard and max cues for technique and sequencing. Pt required assist to doff pants; performed hygiene and donning pants/underwear with standbyA. Returned to bed stand pivot min guard; continues to require cues for LLE management and sequencing. Sit >supine maxA d/t minimal pt effort. Supine BLE exercises including straight leg raise and heel slides; AROM RLE, AAROM LLE d/t apraxia and knee pain. Remained supine in bed with daughter present, alarm intact and all needs in reach.      Therapy  Documentation Precautions:  Precautions Precautions: Fall Restrictions Weight Bearing Restrictions: No  See Function Navigator for Current Functional Status.   Therapy/Group: Individual Therapy  Vista Lawman 03/30/2017, 2:59 PM

## 2017-03-31 ENCOUNTER — Inpatient Hospital Stay (HOSPITAL_COMMUNITY): Payer: Medicaid Other | Admitting: Occupational Therapy

## 2017-03-31 ENCOUNTER — Inpatient Hospital Stay (HOSPITAL_COMMUNITY): Payer: Medicaid Other | Admitting: Physical Therapy

## 2017-03-31 ENCOUNTER — Inpatient Hospital Stay (HOSPITAL_COMMUNITY): Payer: Medicaid Other | Admitting: Speech Pathology

## 2017-03-31 DIAGNOSIS — R45851 Suicidal ideations: Secondary | ICD-10-CM

## 2017-03-31 LAB — CBC WITH DIFFERENTIAL/PLATELET
BASOS ABS: 0 10*3/uL (ref 0.0–0.1)
Basophils Relative: 1 %
Eosinophils Absolute: 0.3 10*3/uL (ref 0.0–0.7)
Eosinophils Relative: 5 %
HEMATOCRIT: 32.8 % — AB (ref 36.0–46.0)
HEMOGLOBIN: 10.5 g/dL — AB (ref 12.0–15.0)
LYMPHS ABS: 1.1 10*3/uL (ref 0.7–4.0)
LYMPHS PCT: 22 %
MCH: 27.4 pg (ref 26.0–34.0)
MCHC: 32 g/dL (ref 30.0–36.0)
MCV: 85.6 fL (ref 78.0–100.0)
Monocytes Absolute: 0.5 10*3/uL (ref 0.1–1.0)
Monocytes Relative: 9 %
NEUTROS ABS: 3.3 10*3/uL (ref 1.7–7.7)
NEUTROS PCT: 63 %
PLATELETS: 192 10*3/uL (ref 150–400)
RBC: 3.83 MIL/uL — AB (ref 3.87–5.11)
RDW: 13.9 % (ref 11.5–15.5)
WBC: 5.2 10*3/uL (ref 4.0–10.5)

## 2017-03-31 LAB — BASIC METABOLIC PANEL
ANION GAP: 9 (ref 5–15)
BUN: 29 mg/dL — AB (ref 6–20)
CHLORIDE: 101 mmol/L (ref 101–111)
CO2: 25 mmol/L (ref 22–32)
Calcium: 9.5 mg/dL (ref 8.9–10.3)
Creatinine, Ser: 1.73 mg/dL — ABNORMAL HIGH (ref 0.44–1.00)
GFR calc Af Amer: 33 mL/min — ABNORMAL LOW (ref >60)
GFR, EST NON AFRICAN AMERICAN: 29 mL/min — AB (ref >60)
Glucose, Bld: 154 mg/dL — ABNORMAL HIGH (ref 65–99)
POTASSIUM: 3.3 mmol/L — AB (ref 3.5–5.1)
SODIUM: 135 mmol/L (ref 135–145)

## 2017-03-31 MED ORDER — DICLOFENAC SODIUM 1 % TD GEL
2.0000 g | Freq: Four times a day (QID) | TRANSDERMAL | Status: DC
  Administered 2017-03-31 – 2017-04-05 (×16): 2 g via TOPICAL
  Filled 2017-03-31: qty 100

## 2017-03-31 NOTE — Progress Notes (Signed)
Speech Language Pathology Daily Session Note  Patient Details  Name: Christina [name] MRN: [id number] Date of Birth: 09-30-1946  Today's Date: 03/31/2017 SLP Individual Time: 0930-1030 SLP Individual Time Calculation (min): 60 min  Short Term Goals: Week 2: SLP Short Term Goal 1 (Week 2): Patient will utilize external aids for orientation to place, time and siutation with Mod A verbal and visual cues.  SLP Short Term Goal 2 (Week 2): Patient will demonstrate sustained attention to a functional task for 15 minutes with Min A verbal cues.  SLP Short Term Goal 3 (Week 2): Patient will demonstrate functional problem solving for basic and familiar tasks with Mod A multimodal cues.  SLP Short Term Goal 4 (Week 2): Patient will identify 1 physical and 1 cognitive changes with Mod A verbal cues.   Skilled Therapeutic Interventions: Skilled treatment session focused on cognition goals. SLP facilitated session by providing Mod A verbal cues to sustain attention to functional task for ~ 15 minutes. Pt with increased internal distractions d/t perseveration on increased nursing supervision for suicide sitters. Pt able to solve functional problem solving for basic task (Spot It) and placement of numbers on calendar. Pt required Max multimodal cues for intellectual awareness. Pt was returned to room, left upright in wheelchair, sitter and daughter present. Continue per current plan of care.      Function:    Cognition Comprehension Comprehension assist level: Understands basic 50 - 74% of the time/ requires cueing 25 - 49% of the time  Expression   Expression assist level: Expresses basic 75 - 89% of the time/requires cueing 10 - 24% of the time. Needs helper to occlude trach/needs to repeat words.  Social Interaction Social Interaction assist level: Interacts appropriately 50 - 74% of the time - May be physically or verbally inappropriate.  Problem Solving    Memory Memory assist level: Recognizes or  recalls 25 - 49% of the time/requires cueing 50 - 75% of the time    Pain    Therapy/Group: Individual Therapy   Sadie Hazelett B. Dreama Saa, M.S., CCC-SLP Speech-Language Pathologist   Tacoya Altizer 03/31/2017, 11:36 AM

## 2017-03-31 NOTE — Progress Notes (Signed)
Occupational Therapy Session Note  Patient Details  Name: Christina [name] MRN: [id number] Date of Birth: May 22, 1946  Today's Date: 03/31/2017 OT Individual Time: 4098-1191 OT Individual Time Calculation (min): 72 min    Short Term Goals: Week 2:  OT Short Term Goal 1 (Week 2): Pt will be able to donn pants over L foot with min cues demonstrating improved praxis. OT Short Term Goal 2 (Week 2): Pt will be able to pull pants over her hips with steadying A for balance.  OT Short Term Goal 3 (Week 2): Pt will shower with min A. OT Short Term Goal 4 (Week 2): Pt will stand pivot to tub bench with steadying A and less than 25% cues for technique.  Skilled Therapeutic Interventions/Progress Updates:    Pt alert and up in wheelchair to start session.  Transferred with RW to the shower bench for bathing, with mod demonstrational cueing and mod assist.  Pt standing too far away from the walker as well as demonstrating increased trunk flexion and left lean.  Removed paper clothing and socks sit to stand with overall min assist prior to shower.  Max instructional cueing to sequence through bathing with min assist for sit to stand.  Noted motor apraxia when attempting to lift the LLE off of the floor to cross over the right knee for washing.  Decreased spontaneous use of the LUE throughout session secondary to negelctas well, but when integrated she could use with supervision for all washing and for dressing tasks.  Decreased sustained attention with pt frequently getting distracted during session.  When MD came in to visit and ask her questions, she continually kept trying to turn on the water instead of just giving him her attention.  Max cueing for therapist re-direction.  Decreased carryover of hemidressing techniques as pt would continually attempt to donn the RLE in her underpants and pants first instead of the left.  Min assist for sit to stand from the wheelchair to pull items over the hips.  Mod hand over  hand to get pt to let go of the sink with the LUE in order to use for pulling up pants or donning gripper socks.   Finished session with pt up in wheelchair with sitter and pt's daughter present as well.    Therapy Documentation Precautions:  Precautions Precautions: Fall Precaution Comments: apraxia, right hemiparesis Restrictions Weight Bearing Restrictions: No  Pain: Pain Assessment Pain Assessment: Faces Pain Type: Acute pain Pain Location: Knee Pain Intervention(s): Repositioned;Medication (See eMAR) ADL: ADL ADL Comments: refer to functional navigator  See Function Navigator for Current Functional Status.   Therapy/Group: Individual Therapy  Pansie Guggisberg OTR/L 03/31/2017, 12:02 PM

## 2017-03-31 NOTE — Progress Notes (Signed)
Subjective/Complaints: Pt working with OT this AM.  She slept well overnight.  She does not recall SI yesterday and states she would never do something like that because she has children.  ROS: Denies SI, CP, SOB, N/V/D  Objective: Vital Signs: Blood pressure 132/61, pulse 72, temperature 98.3 F (36.8 C), temperature source Oral, resp. rate 17, weight 65.2 kg (143 lb 13.6 oz), SpO2 98 %. No results found. No results found for this or any previous visit (from the past 72 hour(s)).   Gen NAD. Vital signs reviewed. HEENT: Normocephalic, atraumatic.  Cardio: RRR. No JVD. Resp: CTA B/L and unlabored GI: BS positive and NT Musc/Skel:  Left knee TTP Neuro: Alert and oriented x2 Motor RUE 5/5 RLE 4/5 HF, 4+/5 KE, ADF/PF LUE 4+/5 LLE 3/5 HF, 4+/5 KE, 2/5 ADF/PF Skin:   Intact. Warm and dry  Assessment/Plan: 1. Functional deficits secondary to Left MCA which require 3+ hours per day of interdisciplinary therapy in a comprehensive inpatient rehab setting. Physiatrist is providing close team supervision and 24 hour management of active medical problems listed below. Physiatrist and rehab team continue to assess barriers to discharge/monitor patient progress toward functional and medical goals. FIM: Function - Bathing Position: Shower Body parts bathed by patient: Right arm, Left arm, Chest, Abdomen, Front perineal area, Right upper leg, Left upper leg, Right lower leg, Left lower leg, Buttocks Body parts bathed by helper: Back Assist Level: Touching or steadying assistance(Pt > 75%)  Function- Upper Body Dressing/Undressing What is the patient wearing?: Pull over shirt/dress Pull over shirt/dress - Perfomed by patient: Thread/unthread right sleeve, Thread/unthread left sleeve, Put head through opening, Pull shirt over trunk Pull over shirt/dress - Perfomed by helper: Put head through opening, Pull shirt over trunk Assist Level: Set up Set up : To obtain clothing/put away Function -  Lower Body Dressing/Undressing What is the patient wearing?: Underwear, Pants, Non-skid slipper socks Position: Wheelchair/chair at sink Underwear - Performed by patient: Thread/unthread right underwear leg Underwear - Performed by helper: Thread/unthread left underwear leg, Pull underwear up/down Pants- Performed by patient: Thread/unthread right pants leg Pants- Performed by helper: Thread/unthread right pants leg, Thread/unthread left pants leg, Pull pants up/down Non-skid slipper socks- Performed by patient: Don/doff left sock Non-skid slipper socks- Performed by helper: Don/doff right sock Assist for footwear: Maximal assist Assist for lower body dressing:  (max A)  Function - Toileting Toileting steps completed by patient: Adjust clothing after toileting, Performs perineal hygiene Toileting steps completed by helper: Adjust clothing prior to toileting Toileting Assistive Devices: Grab bar or rail Assist level: Touching or steadying assistance (Pt.75%)  Function - Archivist transfer assistive device: Bedside commode Mechanical lift: Stedy Assist level to toilet: Touching or steadying assistance (Pt > 75%) Assist level from toilet: Touching or steadying assistance (Pt > 75%) Assist level to bedside commode (at bedside): Touching or steadying assistance (Pt > 75%) Assist level from bedside commode (at bedside): Touching or steadying assistance (Pt > 75%)  Function - Chair/bed transfer Chair/bed transfer method: Stand pivot Chair/bed transfer assist level: Touching or steadying assistance (Pt > 75%) Chair/bed transfer assistive device: Armrests, Walker Mechanical lift: Stedy Chair/bed transfer details: Verbal cues for technique, Verbal cues for sequencing, Visual cues/gestures for sequencing, Manual facilitation for weight shifting, Tactile cues for placement, Tactile cues for posture, Tactile cues for sequencing, Verbal cues for safe use of DME/AE  Function -  Locomotion: Wheelchair Type: Manual Max wheelchair distance: 25 ft Assist Level: Touching or steadying assistance (Pt > 75%)  Turns around,maneuvers to table,bed, and toilet,negotiates 3% grade,maneuvers on rugs and over doorsills: No Function - Locomotion: Ambulation Assistive device: Walker-rolling Max distance: 120' Assist level: Touching or steadying assistance (Pt > 75%) Assist level: Touching or steadying assistance (Pt > 75%) Assist level: Touching or steadying assistance (Pt > 75%) Walk 150 feet activity did not occur: Safety/medical concerns Assist level: Supervision or verbal cues Walk 10 feet on uneven surfaces activity did not occur: Safety/medical concerns  Function - Comprehension Comprehension: Auditory Comprehension assist level: Understands basic 50 - 74% of the time/ requires cueing 25 - 49% of the time  Function - Expression Expression: Verbal Expression assist level: Expresses basic 75 - 89% of the time/requires cueing 10 - 24% of the time. Needs helper to occlude trach/needs to repeat words.  Function - Social Interaction Social Interaction assist level: Interacts appropriately 50 - 74% of the time - May be physically or verbally inappropriate.  Function - Problem Solving Problem solving assist level: Solves basic 50 - 74% of the time/requires cueing 25 - 49% of the time  Function - Memory Memory assist level: Recognizes or recalls 25 - 49% of the time/requires cueing 50 - 75% of the time Patient normally able to recall (first 3 days only): Current season, That he or she is in a hospital, Staff names and faces, Location of own room  Medical Problem List and Plan: 1.  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  DVT Prophylaxis/Anticoagulation: Eliquis 3. Pain Management: Tylenol as needed 4. Mood: Cont Remeron 7.5 mg daily at bedtime  5. Neuropsych: This patient is capable of making decisions on her own  behalf. 6. Skin/Wound Care: Routine skin checks 7. Fluids/Electrolytes/Nutrition: Routine I&Os 8. Hypertension. Coreg 12.5 mg twice a day, verapamil 240 mg daily at bedtime increase to  , Cozaar 100 mg daily  HCTZ increased to 12.5 on 4/4  Controlled 4/9 9. Atrial fibrillation. Plan is currently to continue on Eliquis.  10. Tobacco abuse. Counseling 11. Hyperlipidemia. Zocor, will transition to Lipitor at d/c 12. ABLA  Hb 9.8 on 4/5  Labs pending 13. CKD  Now with AKI  Cr 1.47 on 4/5   Labs pending  Encourage fluids 14. Left knee OA  Imaging reviewed  Voltaren gel d/ced, lidoderm patch ordered  Will consider steroid injection  15. Sleep disturbance  Trazodone when necessary ordered 4/8  16. SI  Unclear if this was genuine SI.  Currently on precautions, however, pt denies ideation at present and does not recall event.  Daughter also doubtful of genuine SI.  Will attempt to move pt closer to nursing station and monitor  LOS (Days) 12 A FACE TO FACE EVALUATION WAS PERFORMED  Lelaina Oatis Karis Juba 03/31/2017, 9:00 AM

## 2017-03-31 NOTE — Progress Notes (Signed)
Physical Therapy Session Note  Patient Details  Name: Christina [name] MRN: [id number] Date of Birth: 03-09-1946  Today's Date: 03/31/2017 PT Individual Time: 1027-2536 PT Individual Time Calculation (min): 56 min   Short Term Goals: Week 2:  PT Short Term Goal 1 (Week 2): = LTGs due to anticipated LOS  Skilled Therapeutic Interventions/Progress Updates:  Pt received in bed & agreeable to tx. Session focused on transfers, gait, & cognitive remediation.  Pt transferred supine>sitting with supervision and use of bed rails, requiring more than reasonable amount of time and cuing to scoot buttocks forward to place feet on floor. Pt utilizes UE to assist LLE to EOB. Pt transferred bed>w/c via squat pivot with min assist and tactile cuing for sequencing. Pt tends to hold to bed rail instead of pushing through it to assist with transfer. Transported pt to gym via w/c total assist for time management. Pt engaged in pipe tree assembly and was able to assemble a simple shape without cuing from pre-selected pieces. However, when pt attempted to assemble shape from choice of many pt required max cuing for problem solving, error correction, and attention to task. Positioned pipes to L of pt to address L inattention. Pt with minimal use of LUE despite max cuing to use extremity. Gait training x 45 ft + 100 with RW & min assist with therapist providing cuing for increased step length LLE as well as to ambulate within base of RW. Pt requires max cuing and assist with managing RW when turning due to poor ability to sequence turn. Pt ambulates with forward flexed posture and with tendency to drag L foot/hip behind her despite max multimodal cuing and therapist's attempts to provide manual facilitation for weight shifting & pelvic rotation. At end of session pt returned to room and required max cuing to attend to task to transfer sit>supine, as well as min assist for LLE. Pt able to scoot to [facility] with use of bed rails,  [place] position, and cuing for technique; pt does not use LLE to assist with scooting. At end of session pt left in bed with all needs within reach, alarm set, & RN present. RN aware of pt's c/o LLE pain.  During session pt is internally distracted requiring max cuing for attention to tasks.  Therapy Documentation Precautions:  Precautions Precautions: Fall Precaution Comments: apraxia, right hemiparesis Restrictions Weight Bearing Restrictions: No  Pain: Noted pain in LLE & L hip/back - RN made aware who reports pt is premedicated.   See Function Navigator for Current Functional Status.   Therapy/Group: Individual Therapy  Sandi Mariscal 03/31/2017, 4:02 PM

## 2017-03-31 NOTE — Progress Notes (Signed)
Social Work Patient ID: Christina [name], female   DOB: 11-Nov-1946, 71 y.o.   MRN: [id number]  Spoke with pt regarding her weekend and how she was feeling. She was focused upon her knee hurting and the need for  That gel she was using that MD took away. Christina [name] has gotten the voltaren gel back and put on her knee. Pt hopes with this she will walk better like she did on Friday. She was doing well on Friday. She has no memory of telling RN about not wanting to be here and her shoe laces. She feels she would not do this. She does feel she has no control and would like to wear her own clothes instead of her Daughter's. She likes looser clothes and not the tight capri her daughter brought her to wear. Will contact daughter regarding pt's looser pants to wear. Pt is making progress in therapies and is trying to focus on this. Neuro-psych to see tomorrow again for coping.

## 2017-04-01 ENCOUNTER — Encounter (HOSPITAL_COMMUNITY): Payer: Self-pay | Admitting: Psychology

## 2017-04-01 ENCOUNTER — Inpatient Hospital Stay (HOSPITAL_COMMUNITY): Payer: Self-pay | Admitting: Occupational Therapy

## 2017-04-01 ENCOUNTER — Inpatient Hospital Stay (HOSPITAL_COMMUNITY): Payer: Medicaid Other | Admitting: Physical Therapy

## 2017-04-01 ENCOUNTER — Inpatient Hospital Stay (HOSPITAL_COMMUNITY): Payer: Medicaid Other | Admitting: Speech Pathology

## 2017-04-01 DIAGNOSIS — E876 Hypokalemia: Secondary | ICD-10-CM

## 2017-04-01 MED ORDER — POTASSIUM CHLORIDE CRYS ER 20 MEQ PO TBCR
20.0000 meq | EXTENDED_RELEASE_TABLET | Freq: Every day | ORAL | Status: DC
  Administered 2017-04-01 – 2017-04-05 (×5): 20 meq via ORAL
  Filled 2017-04-01 (×5): qty 1

## 2017-04-01 MED ORDER — SODIUM CHLORIDE 0.9 % IV SOLN
INTRAVENOUS | Status: DC
  Administered 2017-04-01 – 2017-04-03 (×3): via INTRAVENOUS

## 2017-04-01 MED ORDER — TRAMADOL HCL 50 MG PO TABS: 100.0000 mg | ORAL_TABLET | Freq: Two times a day (BID) | ORAL | Status: DC | PRN

## 2017-04-01 NOTE — Progress Notes (Signed)
Social Work Patient ID: Christina [name], female   DOB: 1946-08-15, 71 y.o.   MRN: [id number]  Met with daughter who needs a note/letter so she can be excused form work while staying here with Mom. She wants to be here when The neuro-psychologist see's her Mom today. Have given her the letter to take to work with her. Team did some family education since daughter was here today with pt during therapies. Pt having knee pain still today Using voltaren gel which has helped in the past. Work on providing support and discharge needs.

## 2017-04-01 NOTE — Progress Notes (Signed)
Subjective/Complaints: Pt seen laying in bed this AM.  She states she did not sleep well because of pain in her left knee.  Today, however, she states she does not have any pain.   ROS: Denies SI, CP, SOB, N/V/D  Objective: Vital Signs: Blood pressure (!) 134/59, pulse 62, temperature 98.3 F (36.8 C), temperature source Oral, resp. rate 18, weight 65.2 kg (143 lb 13.6 oz), SpO2 99 %. No results found. Results for orders placed or performed during the hospital encounter of 03/19/17 (from the past 72 hour(s))  Basic metabolic panel     Status: Abnormal   Collection Time: 03/31/17  9:30 AM  Result Value Ref Range   Sodium 135 135 - 145 mmol/L   Potassium 3.3 (L) 3.5 - 5.1 mmol/L   Chloride 101 101 - 111 mmol/L   CO2 25 22 - 32 mmol/L   Glucose, Bld 154 (H) 65 - 99 mg/dL   BUN 29 (H) 6 - 20 mg/dL   Creatinine, Ser 1.73 (H) 0.44 - 1.00 mg/dL   Calcium 9.5 8.9 - 10.3 mg/dL   GFR calc non Af Amer 29 (L) >60 mL/min   GFR calc Af Amer 33 (L) >60 mL/min    Comment: (NOTE) The eGFR has been calculated using the CKD EPI equation. This calculation has not been validated in all clinical situations. eGFR's persistently <60 mL/min signify possible Chronic Kidney Disease.    Anion gap 9 5 - 15  CBC with Differential/Platelet     Status: Abnormal   Collection Time: 03/31/17  9:30 AM  Result Value Ref Range   WBC 5.2 4.0 - 10.5 K/uL   RBC 3.83 (L) 3.87 - 5.11 MIL/uL   Hemoglobin 10.5 (L) 12.0 - 15.0 g/dL   HCT 32.8 (L) 36.0 - 46.0 %   MCV 85.6 78.0 - 100.0 fL   MCH 27.4 26.0 - 34.0 pg   MCHC 32.0 30.0 - 36.0 g/dL   RDW 13.9 11.5 - 15.5 %   Platelets 192 150 - 400 K/uL   Neutrophils Relative % 63 %   Neutro Abs 3.3 1.7 - 7.7 K/uL   Lymphocytes Relative 22 %   Lymphs Abs 1.1 0.7 - 4.0 K/uL   Monocytes Relative 9 %   Monocytes Absolute 0.5 0.1 - 1.0 K/uL   Eosinophils Relative 5 %   Eosinophils Absolute 0.3 0.0 - 0.7 K/uL   Basophils Relative 1 %   Basophils Absolute 0.0 0.0 - 0.1 K/uL      Gen NAD. Vital signs reviewed. HEENT: Normocephalic, atraumatic.  Cardio: RRR. No JVD. Resp: CTA B/L and unlabored GI: BS positive and NT Musc/Skel:  No TTP left knee Neuro: Alert and oriented x2 with cues.  Motor RUE 5/5 RLE 4/5 HF, 4+/5 KE, ADF/PF LUE 4+/5 LLE 3/5 HF, 4+/5 KE, 2/5 ADF/PF Skin:   Intact. Warm and dry  Assessment/Plan: 1. Functional deficits secondary to Left MCA which require 3+ hours per day of interdisciplinary therapy in a comprehensive inpatient rehab setting. Physiatrist is providing close team supervision and 24 hour management of active medical problems listed below. Physiatrist and rehab team continue to assess barriers to discharge/monitor patient progress toward functional and medical goals. FIM: Function - Bathing Position: Shower Body parts bathed by patient: Right arm, Left arm, Abdomen, Front perineal area, Buttocks, Left upper leg, Right lower leg, Back Body parts bathed by helper: Left lower leg Assist Level: Touching or steadying assistance(Pt > 75%)  Function- Upper Body Dressing/Undressing What is the patient  wearing?: Pull over shirt/dress Pull over shirt/dress - Perfomed by patient: Thread/unthread right sleeve, Thread/unthread left sleeve, Put head through opening, Pull shirt over trunk Pull over shirt/dress - Perfomed by helper: Put head through opening, Pull shirt over trunk Assist Level: Set up Set up : To obtain clothing/put away Function - Lower Body Dressing/Undressing What is the patient wearing?: Underwear, Pants, Non-skid slipper socks Position: Wheelchair/chair at sink Underwear - Performed by patient: Thread/unthread right underwear leg, Pull underwear up/down Underwear - Performed by helper: Thread/unthread left underwear leg Pants- Performed by patient: Thread/unthread right pants leg, Pull pants up/down Pants- Performed by helper: Thread/unthread right pants leg, Thread/unthread left pants leg, Pull pants  up/down Non-skid slipper socks- Performed by patient: Don/doff right sock Non-skid slipper socks- Performed by helper: Don/doff left sock Assist for footwear: Maximal assist Assist for lower body dressing:  (max A)  Function - Toileting Toileting steps completed by patient: Adjust clothing prior to toileting, Performs perineal hygiene Toileting steps completed by helper: Adjust clothing after toileting Toileting Assistive Devices: Grab bar or rail Assist level: Touching or steadying assistance (Pt.75%)  Function - Air cabin crew transfer assistive device: Bedside commode Mechanical lift: Stedy Assist level to toilet: Touching or steadying assistance (Pt > 75%) Assist level from toilet: Touching or steadying assistance (Pt > 75%) Assist level to bedside commode (at bedside): Touching or steadying assistance (Pt > 75%) Assist level from bedside commode (at bedside): Touching or steadying assistance (Pt > 75%)  Function - Chair/bed transfer Chair/bed transfer method: Squat pivot Chair/bed transfer assist level: Touching or steadying assistance (Pt > 75%) Chair/bed transfer assistive device: Armrests, Bedrails Mechanical lift: Stedy Chair/bed transfer details: Tactile cues for sequencing, Tactile cues for weight shifting, Tactile cues for placement, Tactile cues for posture, Manual facilitation for weight shifting, Manual facilitation for placement, Verbal cues for sequencing, Verbal cues for technique  Function - Locomotion: Wheelchair Type: Manual Max wheelchair distance: 25 ft Assist Level: Touching or steadying assistance (Pt > 75%) Turns around,maneuvers to table,bed, and toilet,negotiates 3% grade,maneuvers on rugs and over doorsills: No Function - Locomotion: Ambulation Assistive device: Walker-rolling Max distance: 100 ft Assist level: Touching or steadying assistance (Pt > 75%) Assist level: Touching or steadying assistance (Pt > 75%) Assist level: Touching or  steadying assistance (Pt > 75%) Walk 150 feet activity did not occur: Safety/medical concerns Assist level: Supervision or verbal cues Walk 10 feet on uneven surfaces activity did not occur: Safety/medical concerns  Function - Comprehension Comprehension: Auditory Comprehension assist level: Understands basic 50 - 74% of the time/ requires cueing 25 - 49% of the time  Function - Expression Expression: Verbal Expression assist level: Expresses basic 75 - 89% of the time/requires cueing 10 - 24% of the time. Needs helper to occlude trach/needs to repeat words.  Function - Social Interaction Social Interaction assist level: Interacts appropriately 50 - 74% of the time - May be physically or verbally inappropriate.  Function - Problem Solving Problem solving assist level: Solves basic 50 - 74% of the time/requires cueing 25 - 49% of the time  Function - Memory Memory assist level: Recognizes or recalls 25 - 49% of the time/requires cueing 50 - 75% of the time Patient normally able to recall (first 3 days only): Current season, That he or she is in a hospital, Staff names and faces, Location of own room  Medical Problem List and Plan: 1.  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  DVT Prophylaxis/Anticoagulation: Eliquis 3. Pain Management: Tylenol as needed 4. Mood: Cont Remeron 7.5 mg daily at bedtime  5. Neuropsych: This patient is capable of making decisions on her own behalf. 6. Skin/Wound Care: Routine skin checks 7. Fluids/Electrolytes/Nutrition: Routine I&Os 8. Hypertension. Coreg 12.5 mg twice a day, verapamil 240 mg daily at bedtime increase to 322m , Cozaar 100 mg daily  HCTZ increased to 12.5 on 4/4  Overall controlled 4/10 9. Atrial fibrillation. Plan is currently to continue on Eliquis.  10. Tobacco abuse. Counseling 11. Hyperlipidemia. Zocor, will transition to Lipitor at d/c 12. ABLA  Hb 10.5 on 4/9 13. CKD  Now  with AKI  Cr 1.73 on 4/59  Encourage fluids  Will start IVF qHS 14. Left knee OA  Imaging reviewed  Voltaren gel reordered per pt requestion, lidoderm patch ordered  Will consider steroid injection   Tramadol ordered 15. Sleep disturbance  Trazodone when necessary ordered 4/8  16. SI  Unclear if this was genuine SI.  Currently on precautions, however, pt denies ideation at present and does not recall event.  Daughter also doubtful of genuine SI.    Unable able to move pt close to nurse station and has not been seen by Psych.  Cont precaution, especially given IVF. 17. Hypokalemia  K+ 3.3 on 4/9  Supplement started 4/9  LOS (Days) 13 A FACE TO FACE EVALUATION WAS PERFORMED  Ankit ALorie Phenix4/09/2017, 8:16 AM

## 2017-04-01 NOTE — Progress Notes (Signed)
Physical Therapy Session Note  Patient Details  Name: Christina [name] MRN: [id number] Date of Birth: 09-28-1946  Today's Date: 04/01/2017 PT Individual Time: 0950-1050  PT Individual Time Calculation (min): 60 min   Short Term Goals: Week 2:  PT Short Term Goal 1 (Week 2): = LTGs due to anticipated LOS  Skilled Therapeutic Interventions/Progress Updates:   Patient in wheelchair with daughter present to observe session and initiate family education. Patient initially stating she can't do therapies until she sees psychiatrist. Reviewed patient's schedule with patient who initially did not recall prior OT session. Patient eventually agreeable to participate. Patient instructed in sit <> stand transfers using RW with max verbal/visual cues for hand placement and managing RW with supervision. Gait training using RW x 200 ft + 75 ft with patient stopping every 10-15 steps, requiring max verbal cues for attention to task and upright posture with supervision. Performed NuStep using BUE/BLE at level 3 x 10 min with focus on sustained attention, L NMR, and basic functional math to determine how many minutes left with total A. Patient's daughter educated on need for 24/7 supervision due to cognitive impairments and safety concerns, need to provide max verbal cues to sequence and complete basic functional tasks, and how to instruct patient in safe transfers using RW. Remainder of session focused on initiation of hands-on family training with patient and daughter. Daughter providing max verbal cues for ambulation and functional transfers using RW with supervision. Attempted bed mobility on regular bed in ADL apartment to simulate bed height at daughter's home. Patient required mod A to lift BLE onto bed and was unable to assist with scooting back on bed. Once on bed, patient's daughter forcefully rolled patient over without need and required max instructions to allow patient to attempt mobility tasks independently  before requiring assistance as needed and need to listen to patient's concerns with no carryover noted as daughter again rolled patient back and transferred her supine > sit with total A. Patient left sitting in wheelchair with needs in reach, daughter in room and sitter present.   Therapy Documentation Precautions:  Precautions Precautions: Fall Precaution Comments: apraxia, left hemiparesis Restrictions Weight Bearing Restrictions: No Pain:  Unrated L knee pain with activity, provided rest breaks and distraction   See Function Navigator for Current Functional Status.   Therapy/Group: Individual Therapy  Shaton Lore, Prudencio Pair 04/01/2017, 12:24 PM

## 2017-04-01 NOTE — Progress Notes (Signed)
Occupational Therapy Session Note  Patient Details  Name: Christina [name] MRN: [id number] Date of Birth: [birth year]  Today's Date: 04/01/2017 OT Individual Time: 0800-0902 OT Individual Time Calculation (min): 62 min    Short Term Goals: Week 2:  OT Short Term Goal 1 (Week 2): Pt will be able to donn pants over L foot with min cues demonstrating improved praxis. OT Short Term Goal 2 (Week 2): Pt will be able to pull pants over her hips with steadying A for balance.  OT Short Term Goal 3 (Week 2): Pt will shower with min A. OT Short Term Goal 4 (Week 2): Pt will stand pivot to tub bench with steadying A and less than 25% cues for technique.  Skilled Therapeutic Interventions/Progress Updates:    Pt worked on bathing at shower level during session.  Min assist with max demonstrational cueing for walking into the bathroom with the RW, as she stays too far to the left and outside of the walker.  Pt with increased left knee pain and back pain in standing, demonstrating flexed posture at the trunk.  Decreased organization also noted with bathing with pt applying soap to the washcloth and then rinsing it right off.  Washing body parts with min instructional cueing to sequence secondary to random pattern.  Needs mod instructional cueing to integrate the RUE as she just continues to only hold the grab bar with the hand.  Pt needed max demonstrational cueing to remain sitting on the shower seat to dry off as she wanted to stand, without awareness that this was not safe.  She attempted to stand when therapist stepped out to grab wheelchair as well, requiring re-direction again.  Dressing sit to stand at the sink with mod demonstrational cueing for hemi techniques to begin crossing the LLE over the right knee.  She still demonstrates decreased ability to initiate crossing the LLE over the right knee and needs min to mod assist to complete this.  Neglect of the LUE noted during dressing with pt not donning the  paper shirt over the LLE completely and then trying to pull it over her head, causing it to get stuck.  Re-direction needed to pull shirt back over her head and then pull all the way up the arm.  Pt's daughter Arline Asp present at end of session.  Pt not recalling daughter coming in yesterday morning at end of session as well.  Needed max cueing from therapist to recall.  At start of session pt also reluctant to participate in therapy and unable to state why she was in the hospital, demonstrating decreased intellectual awareness.  Pt left in room with sitter present and daughter while working on oral hygiene from sitting position.    Therapy Documentation Precautions:  Precautions Precautions: Fall Precaution Comments: apraxia, left hemiparesis Restrictions Weight Bearing Restrictions: No  Pain: Pain Assessment Pain Assessment: Faces Faces Pain Scale: Hurts little more Pain Type: Chronic pain Pain Location: Knee Pain Orientation: Right Pain Intervention(s): Repositioned ADL: See Function Navigator for Current Functional Status.   Therapy/Group: Individual Therapy  Philmore Lepore OTR/L 04/01/2017, 10:26 AM

## 2017-04-01 NOTE — Consult Note (Signed)
Patient:  Christina [name]   DOB: Sep 18, 1946  MR Number: [id number]  Location: [facility] [address] [id number] [place] [address] Dept: [phone] Loc: [phone]  Start: 12:40 End: 1: 40 PM  Provider/Observer:        Chief Complaint:     The patient has had a very difficult time with adjusting to moving from her home in [place].  Had become very depressed over weekend and made a suicidal gesture.    Reason For Service:     Below is the H&P from the patient's medical records.  Christina [name] a 71 y.o.right handed femalewith history of hypertension, prior tobacco abuse and recently diagnosed with CVA about 3 weeks ago while in [place] with left-sided weakness and placed on aspirin and Plavix for CVA prophylaxis. She was later discharged to home but by report of family unable to ambulate. She was subsequently brought to the [place] by her daughter for further management and presented to [facility] 03/14/2017.Per chart review patient is from [place] had been living in [place] for the past 30 years alone and was independent. She has family in [place] who can assist as needed.A CT of the head showed cerebral atrophy, ventriculomegaly and periventricular white matter disease. Suspect lacunar type left basal ganglia infarct. MRI of the brain showed multiple subacute infarcts in multiple vascular territories suggesting embolic disease from the heart or ascending aorta. There was involvement of the right thalamus, left parieto-occipital junction region and right frontal paramedian brain. CT angiogram head and neck showed no significant carotid stenosis or proximal anterior circulation disease. 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 Left PCA occlusion. Venous Dopplers lower extremities showed superficial thrombosis noted  in the left lesser saphenous vein and in the varicosities of the Left calf. Echocardiogram with ejection fraction of 60% grade 1 diastolic dysfunction. Patient remains on aspirin and Plavix for CVA prophylaxisHowever telemetry showed bouts of atrial fibrillation and was changed to Eliquis. No plan for TEE after telemetry monitored indicate atrial fibrillation.Marland Kitchen Physical and occupational therapy evaluations completed with recommendations of physical medicine rehabilitation consult.Patient was admitted for a comprehensive rehabilitation program  The patient had been having a lot of difficulty with adjusting and started having more negative feelings.  She had increasing feelings of helplessness and hopelessness and at the time "could only think of escaping."  She reports that at the time she was not really wanting to die but just escape from her situation and the loss of her home that was her mother's and she had put so much into fixing up.      Interventions Strategy:  Cognitive behavioral and coping skills building.  Participation Level:   Active  Participation Quality:  Appropriate and Attentive      Behavioral Observation:  Well Groomed, Alert, and Appropriate.   Current Psychosocial Factors: The patient has been having a very hard time coping.  Even though she has her son and daughter as well as granddaughters living here she has had hard time giving up her home in [place] and the thought that her sister (whom she does not get along with) will get the home she has put so much into.  Today she reports that she feels much better.  She reports that she really thought about her options and come to the realization that she will be better staying her with  her family and they have her best interests in mind.  Content of Session:   Reviewed current symptoms and level of depression and assessed current suicidal risks.    Current Status:   The patient reported and clearly appeared to be doing much better  today.  She was bright and engaged and denied any SI and depressive symptoms.  She did open acknowledge the feelings and actions this past weekend.  She denied current feelings and her reports appeared valid and reliable.  Patient Progress:   Very good.  She has come to realization that staying her with Daughter and Son is the right choice and she has become closer to her granddaughters.     Impression/Diagnosis:   The patient is a 71 year old female with both chronic older infarcts as well as more recent cerebrovascular accident. The patient was evaluated and admitted to the neuro rehabilitation program by her daughter who had brought the patient here from [place] The patient has had some difficulty adjusting to leaving her home and friends and family to come here but there were significant concerns on the daughter's side about the level of medical care and availability that was afforded the patient and her small town in [place] The patient is having some difficulty adjusting to the situation I would diagnosis with that as an adjustment disorder but not a major depressive event. There are some clear indications of some cognitive deficits around executive functioning, motor functioning, and overall speed of mental operations. However, she did appear to be cognizant and able to make relatively good decisions. However, some confusion about the level of impairments that she has particular in issues such as her balance or leading her to misinterpreted some of the interventions that are being done such as minimal restraints while in the wheelchair to make sure she doesn't fall out.  4/10:  The patient was clear that her depressive symptoms have improved significantly.  She denies any SI at this time and the issues for the adjustment difficulty have resolved.  I do not think that the patient continues to be a suicide risk.  She has decided to stay and live with her daughter and is working through the  details.  Diagnosis:   Adjustment Disorder with depressed mood resolving.

## 2017-04-01 NOTE — Progress Notes (Signed)
Occupational Therapy Session Note  Patient Details  Name: Christina [name] MRN: [id number] Date of Birth: February 18, 1946  Today's Date: 04/01/2017 OT Individual Time: 7829-5621 OT Individual Time Calculation (min): 47 min    Short Term Goals: Week 2:  OT Short Term Goal 1 (Week 2): Pt will be able to donn pants over L foot with min cues demonstrating improved praxis. OT Short Term Goal 2 (Week 2): Pt will be able to pull pants over her hips with steadying A for balance.  OT Short Term Goal 3 (Week 2): Pt will shower with min A. OT Short Term Goal 4 (Week 2): Pt will stand pivot to tub bench with steadying A and less than 25% cues for technique.  Skilled Therapeutic Interventions/Progress Updates:    Pt completed transfer supine to sit EOB with min assist and mod demonstrational cueing for sequencing. Once sitting she worked on Manufacturing systems engineer socks with min assist and mod demonstrational cueing to incorporate the LUE into task.  She continues to rely on the LUE for stabilization but does not spontaneously incorporate it into tasks.  Transferred to the wheelchair with min assist squat pivot.  Took pt down to the ADL apartment to work on shower/tub transfers using RW and tub bench.  Min assist to complete transfer on the bench with max demonstrational cueing for sequencing and walker safety.  She transferred back out of the tub with mod assist and back to the wheelchair.  She needed max assist specifically for lifting the LLE over the edge of the tub when getting out secondary to motor planning deficits.  When attempting to transfer back to the wheelchair, she demonstrate motor planning deficits and once to the seat she exhibited increased difficulty turning around to align herself with the chair and then reach back to sit.  She continues to want to hold onto the walker instead of reaching back.  Returned to room at end of session with sitter present and pt left sitting up in the wheelchair.    Therapy  Documentation Precautions:  Precautions Precautions: Fall Precaution Comments: apraxia, left hemiparesis Restrictions Weight Bearing Restrictions: No  Pain: Pain Assessment Pain Assessment: Faces Faces Pain Scale: Hurts a little bit Pain Type: Chronic pain Pain Location: Knee Pain Orientation: Left Pain Intervention(s): Repositioned ADL: See Function Navigator for Current Functional Status.   Therapy/Group: Individual Therapy  Kip Cropp OTR/L 04/01/2017, 4:22 PM

## 2017-04-01 NOTE — Progress Notes (Signed)
Speech Language Pathology Weekly Progress and Session Note  Patient Details  Name: Christina [name] MRN: [id number] Date of Birth: 15-Sep-1946  Beginning of progress report period: March 26, 2017 End of progress report period: April 01, 2017  Today's Date: 04/01/2017 SLP Individual Time: 1100-1130 SLP Individual Time Calculation (min): 30 min  Short Term Goals: Week 2: SLP Short Term Goal 1 (Week 2): Patient will utilize external aids for orientation to place, time and siutation with Mod A verbal and visual cues.  SLP Short Term Goal 1 - Progress (Week 2): Not met SLP Short Term Goal 2 (Week 2): Patient will demonstrate sustained attention to a functional task for 15 minutes with Min A verbal cues.  SLP Short Term Goal 2 - Progress (Week 2): Not met SLP Short Term Goal 3 (Week 2): Patient will demonstrate functional problem solving for basic and familiar tasks with Mod A multimodal cues.  SLP Short Term Goal 3 - Progress (Week 2): Not met SLP Short Term Goal 4 (Week 2): Patient will identify 1 physical and 1 cognitive changes with Mod A verbal cues.  SLP Short Term Goal 4 - Progress (Week 2): Not met    New Short Term Goals: Week 3: SLP Short Term Goal 1 (Week 3): Patient will utilize external aids for orientation to place, time and siutation with Mod A verbal and visual cues.  SLP Short Term Goal 2 (Week 3): Patient will demonstrate sustained attention to a functional task for 15 minutes with Min A verbal cues.  SLP Short Term Goal 3 (Week 3): Patient will demonstrate functional problem solving for basic and familiar tasks with Mod A multimodal cues.  SLP Short Term Goal 4 (Week 3): Patient will identify 1 physical and 1 cognitive changes with Mod A verbal cues.   Weekly Progress Updates: Pt has made minimal progress this reporting period and fluctuates between Max to Mod A multimodal cues to complete tasks. Pt's ability is limited by her internal distractions (such as recent suicide  precautions). Pt was re-administered the [facility] version 7.1. She received a score of 6 out of 30 (n>=26). After administration of test, therapeutic cues were given at Mod A verbal cues to effectively generate correct answer. Education provided to pt and daughter Jenny Reichmann) that pt requires Mod A to perform basic functional tasks. 24 hour supervision is recommended d/t decreased safety awareness and cognitive deficits.      Intensity: Minumum of 1-2 x/day, 30 to 90 minutes Frequency: 3 to 5 out of 7 days Duration/Length of Stay: 4/17 Treatment/Interventions: Cognitive remediation/compensation;Cueing hierarchy;Environmental controls;Therapeutic Activities;Internal/external aids;Patient/family education   Daily Session  Skilled Therapeutic Interventions: Skilled treatment session focused on cognition goals. SLP facilitated session by re-administering [facility] version 7.1, see above. Education provided to daughter on need for 24 hour supervision. See above for full details.      Function:     Cognition Comprehension Comprehension assist level: Understands basic 50 - 74% of the time/ requires cueing 25 - 49% of the time  Expression   Expression assist level: Expresses basic 75 - 89% of the time/requires cueing 10 - 24% of the time. Needs helper to occlude trach/needs to repeat words.  Social Interaction Social Interaction assist level: Interacts appropriately 50 - 74% of the time - May be physically or verbally inappropriate.  Problem Solving Problem solving assist level: Solves basic 50 - 74% of the time/requires cueing 25 - 49% of the time  Memory Memory assist level: Recognizes or recalls 25 - 49%  of the time/requires cueing 50 - 75% of the time   General    Pain    Therapy/Group: Individual Therapy  Haasini Patnaude 04/01/2017, 12:26 PM

## 2017-04-02 ENCOUNTER — Inpatient Hospital Stay (HOSPITAL_COMMUNITY): Payer: Medicaid Other | Admitting: Speech Pathology

## 2017-04-02 ENCOUNTER — Inpatient Hospital Stay (HOSPITAL_COMMUNITY): Payer: Medicaid Other | Admitting: Occupational Therapy

## 2017-04-02 ENCOUNTER — Inpatient Hospital Stay (HOSPITAL_COMMUNITY): Payer: Medicaid Other | Admitting: Physical Therapy

## 2017-04-02 DIAGNOSIS — Z8659 Personal history of other mental and behavioral disorders: Secondary | ICD-10-CM

## 2017-04-02 NOTE — Progress Notes (Signed)
Speech Language Pathology Daily Session Note  Patient Details  Name: Christina [name] MRN: [id number] Date of Birth: 1946-09-10  Today's Date: 04/02/2017 SLP Individual Time: 4403-4742 SLP Individual Time Calculation (min): 30 min  Short Term Goals: Week 3: SLP Short Term Goal 1 (Week 3): Patient will utilize external aids for orientation to place, time and siutation with Mod A verbal and visual cues.  SLP Short Term Goal 2 (Week 3): Patient will demonstrate sustained attention to a functional task for 15 minutes with Min A verbal cues.  SLP Short Term Goal 3 (Week 3): Patient will demonstrate functional problem solving for basic and familiar tasks with Mod A multimodal cues.  SLP Short Term Goal 4 (Week 3): Patient will identify 1 physical and 1 cognitive changes with Mod A verbal cues.   Skilled Therapeutic Interventions: Skilled treatment session focused on cognition goals. Upon receiving pt from OT, pt independently volunteered that she was happy about possibly discharging on Friday per "the doctor." She independently stated that she had "a peace about living with Arline Asp" and knew "that Arline Asp would take good care of me." Pt further stated that her "family was here and not in [place] anymore." SLP further facilitated session by providing Mod A to Min A verbal cues for completion of simple puzzle. Pt was returned to room, left upright in wheelchair with sitter present. Continue per current plan of care.      Function:  Eating Eating   Modified Consistency Diet: No Eating Assist Level: More than reasonable amount of time           Cognition Comprehension Comprehension assist level: Understands basic 50 - 74% of the time/ requires cueing 25 - 49% of the time  Expression   Expression assist level: Expresses basic 75 - 89% of the time/requires cueing 10 - 24% of the time. Needs helper to occlude trach/needs to repeat words.  Social Interaction Social Interaction assist level: Interacts  appropriately 75 - 89% of the time - Needs redirection for appropriate language or to initiate interaction.  Problem Solving Problem solving assist level: Solves basic 50 - 74% of the time/requires cueing 25 - 49% of the time  Memory Memory assist level: Recognizes or recalls 25 - 49% of the time/requires cueing 50 - 75% of the time    Pain Pain Assessment Pain Assessment: 0-10 Pain Score: 4  Faces Pain Scale: Hurts even more Pain Type: Chronic pain Pain Location: Knee Pain Orientation: Left Pain Descriptors / Indicators: Aching Pain Onset: With Activity Pain Intervention(s): Medication (See eMAR)  Therapy/Group: Individual Therapy  Nahzir Pohle 04/02/2017, 4:11 PM

## 2017-04-02 NOTE — Progress Notes (Signed)
Physical Therapy Session Note  Patient Details  Name: Christina [name] MRN: [id number] Date of Birth: [birth year]  Today's Date: 04/02/2017 PT Individual Time: 1100-1155 PT Individual Time Calculation (min): 55 min   Short Term Goals: Week 2:  PT Short Term Goal 1 (Week 2): = LTGs due to anticipated LOS  Skilled Therapeutic Interventions/Progress Updates:   Patient in wheelchair upon arrival with daughter present for continued family education. Patient performed sit <> stand transfers using RW with max verbal cues for hand placement and sequencing. Gait training using RW from room to gym x 200 ft with goal of increasing speed to facilitate improved attention to task and decrease distractions, able to complete in 10 minutes. Patient continues to maintain L hip posteriorly rotated in standing and ambulation. Patient's daughter providing appropriate cues for keeping body closer to RW and upright posture. Stair training up/down 12 stairs using 2 rails with supervision overall, no cues needed when ascending but requiring max multimodal cues to completely clear step with L heel and lead with LLE descending to decrease knee pain. Patient no longer needs cues to advance LUE along rail. Performed simulated car transfer at [facility] height to simulate daughter's vehicle using RW, initial demonstration then daughter providing max cues for sequencing and technique as well as attention to task, min A overall to lift/lower LLE due to weakness and pain. Patient's daughter instructed patient in ambulatory transfer to mat table to simulate regular bed with max multimodal cues for sequencing with increased difficulty with turning and hand placement for safe sit <> stand transfers, supervision using RW. Patient performed sit <> supine on bed with daughter providing min A overall for lifting LLE. Throughout family training, when daughter instructing patient in tasks, patient significantly internally and externally distracted by  presence of therapist despite attempts to stay out of line of sight, as she continually asked therapist if "I'm doing it right," despite daughter providing safe and appropriate cues. Patient left sitting in wheelchair with all needs in reach, daughter and sitter present.   Therapy Documentation Precautions:  Precautions Precautions: Fall Precaution Comments: apraxia, left hemiparesis Restrictions Weight Bearing Restrictions: No Pain: Pain Assessment Pain Assessment: 0-10 Pain Score: 6  Pain Type: Chronic pain Pain Location: Knee Pain Orientation: Left Pain Descriptors / Indicators: Aching Pain Onset: With Activity Pain Intervention(s): Ambulation/increased activity;Rest   See Function Navigator for Current Functional Status.   Therapy/Group: Individual Therapy  Margueritte Guthridge, Prudencio Pair 04/02/2017, 12:19 PM

## 2017-04-02 NOTE — Progress Notes (Signed)
Social Work Patient ID: [name], female   DOB: 03-15-1946, 71 y.o.   MRN: [id number] Spoke with daughter and pt to inform pt reaching her goals and have moved up discharge date to 4/14. Both very pleased with and will work toward this. Pt's son to come in for therapy and education prior to discharge. MD working on Lawyer, pt and daughter would be pleased with this. Work on discharge for Sat.

## 2017-04-02 NOTE — Progress Notes (Signed)
Subjective/Complaints: Pt seen laying in bed this AM.  She slept fairly overnight, but is not sure why.  She denies knee pain.    ROS: Denies SI, CP, SOB, N/V/D  Objective: Vital Signs: Blood pressure (!) 163/68, pulse 72, temperature 98 F (36.7 C), temperature source Oral, resp. rate 18, weight 65.2 kg (143 lb 13.6 oz), SpO2 100 %. No results found. Results for orders placed or performed during the hospital encounter of 03/19/17 (from the past 72 hour(s))  Basic metabolic panel     Status: Abnormal   Collection Time: 03/31/17  9:30 AM  Result Value Ref Range   Sodium 135 135 - 145 mmol/L   Potassium 3.3 (L) 3.5 - 5.1 mmol/L   Chloride 101 101 - 111 mmol/L   CO2 25 22 - 32 mmol/L   Glucose, Bld 154 (H) 65 - 99 mg/dL   BUN 29 (H) 6 - 20 mg/dL   Creatinine, Ser 1.73 (H) 0.44 - 1.00 mg/dL   Calcium 9.5 8.9 - 10.3 mg/dL   GFR calc non Af Amer 29 (L) >60 mL/min   GFR calc Af Amer 33 (L) >60 mL/min    Comment: (NOTE) The eGFR has been calculated using the CKD EPI equation. This calculation has not been validated in all clinical situations. eGFR's persistently <60 mL/min signify possible Chronic Kidney Disease.    Anion gap 9 5 - 15  CBC with Differential/Platelet     Status: Abnormal   Collection Time: 03/31/17  9:30 AM  Result Value Ref Range   WBC 5.2 4.0 - 10.5 K/uL   RBC 3.83 (L) 3.87 - 5.11 MIL/uL   Hemoglobin 10.5 (L) 12.0 - 15.0 g/dL   HCT 32.8 (L) 36.0 - 46.0 %   MCV 85.6 78.0 - 100.0 fL   MCH 27.4 26.0 - 34.0 pg   MCHC 32.0 30.0 - 36.0 g/dL   RDW 13.9 11.5 - 15.5 %   Platelets 192 150 - 400 K/uL   Neutrophils Relative % 63 %   Neutro Abs 3.3 1.7 - 7.7 K/uL   Lymphocytes Relative 22 %   Lymphs Abs 1.1 0.7 - 4.0 K/uL   Monocytes Relative 9 %   Monocytes Absolute 0.5 0.1 - 1.0 K/uL   Eosinophils Relative 5 %   Eosinophils Absolute 0.3 0.0 - 0.7 K/uL   Basophils Relative 1 %   Basophils Absolute 0.0 0.0 - 0.1 K/uL     Gen NAD. Vital signs reviewed. HEENT:  Normocephalic, atraumatic.  Cardio: RRR. No JVD. Resp: CTA B/L and unlabored GI: BS positive and NT Musc/Skel:  No TTP left knee Neuro: Alert and oriented x1.  Motor RUE 5/5 RLE 4/5 HF, 4+/5 KE, ADF/PF (unchanged) LUE 4+/5 LLE 3/5 HF, 4+/5 KE, 2/5 ADF/PF Skin:   Intact. Warm and dry  Assessment/Plan: 1. Functional deficits secondary to Left MCA which require 3+ hours per day of interdisciplinary therapy in a comprehensive inpatient rehab setting. Physiatrist is providing close team supervision and 24 hour management of active medical problems listed below. Physiatrist and rehab team continue to assess barriers to discharge/monitor patient progress toward functional and medical goals. FIM: Function - Bathing Position: Shower Body parts bathed by patient: Right arm, Left arm, Chest, Abdomen, Right upper leg, Left upper leg Body parts bathed by helper: Front perineal area, Buttocks Bathing not applicable: Right lower leg, Left lower leg, Back Assist Level: Touching or steadying assistance(Pt > 75%)  Function- Upper Body Dressing/Undressing What is the patient wearing?: Pull over shirt/dress  Pull over shirt/dress - Perfomed by patient: Thread/unthread right sleeve, Thread/unthread left sleeve, Put head through opening Pull over shirt/dress - Perfomed by helper: Pull shirt over trunk Assist Level: Set up Set up : To obtain clothing/put away Function - Lower Body Dressing/Undressing What is the patient wearing?: Underwear, Pants Position: Wheelchair/chair at sink Underwear - Performed by patient: Thread/unthread right underwear leg, Thread/unthread left underwear leg, Pull underwear up/down Underwear - Performed by helper: Thread/unthread left underwear leg Pants- Performed by patient: Thread/unthread right pants leg, Pull pants up/down Pants- Performed by helper: Thread/unthread left pants leg Non-skid slipper socks- Performed by patient: Don/doff right sock Non-skid slipper socks-  Performed by helper: Don/doff left sock Assist for footwear: Maximal assist Assist for lower body dressing:  (max A)  Function - Toileting Toileting steps completed by patient: Adjust clothing prior to toileting, Performs perineal hygiene, Adjust clothing after toileting Toileting steps completed by helper: Adjust clothing after toileting Toileting Assistive Devices: Grab bar or rail Assist level: Touching or steadying assistance (Pt.75%)  Function - Air cabin crew transfer assistive device: Environmental manager lift: Stedy Assist level to toilet: Touching or steadying assistance (Pt > 75%) Assist level from toilet: Touching or steadying assistance (Pt > 75%) Assist level to bedside commode (at bedside): Touching or steadying assistance (Pt > 75%) Assist level from bedside commode (at bedside): Touching or steadying assistance (Pt > 75%)  Function - Chair/bed transfer Chair/bed transfer method: Stand pivot Chair/bed transfer assist level: Touching or steadying assistance (Pt > 75%) Chair/bed transfer assistive device: Armrests, Walker Mechanical lift: Stedy Chair/bed transfer details: Tactile cues for sequencing, Tactile cues for weight shifting, Tactile cues for placement, Tactile cues for posture, Manual facilitation for weight shifting, Manual facilitation for placement, Verbal cues for sequencing, Verbal cues for technique  Function - Locomotion: Wheelchair Type: Manual Max wheelchair distance: 25 ft Assist Level: Touching or steadying assistance (Pt > 75%) Turns around,maneuvers to table,bed, and toilet,negotiates 3% grade,maneuvers on rugs and over doorsills: No Function - Locomotion: Ambulation Assistive device: Walker-rolling Max distance: 200 ft Assist level: Supervision or verbal cues Assist level: Supervision or verbal cues Assist level: Supervision or verbal cues Walk 150 feet activity did not occur: Safety/medical concerns Assist level: Supervision or verbal  cues Walk 10 feet on uneven surfaces activity did not occur: Safety/medical concerns  Function - Comprehension Comprehension: Auditory Comprehension assist level: Understands basic 50 - 74% of the time/ requires cueing 25 - 49% of the time  Function - Expression Expression: Verbal Expression assist level: Expresses basic 75 - 89% of the time/requires cueing 10 - 24% of the time. Needs helper to occlude trach/needs to repeat words.  Function - Social Interaction Social Interaction assist level: Interacts appropriately 50 - 74% of the time - May be physically or verbally inappropriate.  Function - Problem Solving Problem solving assist level: Solves basic 50 - 74% of the time/requires cueing 25 - 49% of the time  Function - Memory Memory assist level: Recognizes or recalls 25 - 49% of the time/requires cueing 50 - 75% of the time Patient normally able to recall (first 3 days only): Current season, That he or she is in a hospital, Staff names and faces, Location of own room  Medical Problem List and Plan: 1.  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  DVT Prophylaxis/Anticoagulation: Eliquis 3. Pain Management: Tylenol as needed 4. Mood: Cont Remeron 7.5 mg daily at bedtime  5. Neuropsych: This patient  is capable of making decisions on her own behalf. 6. Skin/Wound Care: Routine skin checks 7. Fluids/Electrolytes/Nutrition: Routine I&Os 8. Hypertension. Coreg 12.5 mg twice a day, verapamil 240 mg daily at bedtime increase to 314m , Cozaar 100 mg daily  HCTZ increased to 12.5 on 4/4  Elevated, will consider increase tomorrow if persistent 9. Atrial fibrillation. Plan is currently to continue on Eliquis.  10. Tobacco abuse. Counseling 11. Hyperlipidemia. Zocor, will transition to Lipitor at d/c 12. ABLA  Hb 10.5 on 4/9 13. CKD  Now with AKI  Cr 1.73 on 4/59  Encourage fluids  Started IVF qHS  Labs ordered for tomorrow 14. Left  knee OA  Imaging reviewed  Voltaren gel reordered per pt requestion, lidoderm patch ordered  Will consider steroid injection   Tramadol ordered  ?Improved 15. Sleep disturbance  Trazodone when necessary ordered 4/8  16. SI  Unclear if this was genuine SI.  Currently on precautions, however, pt denies ideation at present and does not recall event.  Daughter also doubtful of genuine SI.    Unable able to move pt close to nurse station and has not been seen by Psych.  Cont precaution, especially given IVF.  Appreciate Neuropsych eval 17. Hypokalemia  K+ 3.3 on 4/9  Supplement started 4/9  LOS (Days) 14 A FACE TO FACE EVALUATION WAS PERFORMED  Aluna Whiston ALorie Phenix4/10/2017, 8:19 AM

## 2017-04-02 NOTE — Progress Notes (Signed)
Occupational Therapy Session Note  Patient Details  Name: Christina [name] MRN: [id number] Date of Birth: 10-Sep-1946  Today's Date: 04/02/2017 OT Individual Time: 6962-9528 OT Individual Time Calculation (min): 54 min    Short Term Goals: Week 2:  OT Short Term Goal 1 (Week 2): Pt will be able to donn pants over L foot with min cues demonstrating improved praxis. OT Short Term Goal 2 (Week 2): Pt will be able to pull pants over her hips with steadying A for balance.  OT Short Term Goal 3 (Week 2): Pt will shower with min A. OT Short Term Goal 4 (Week 2): Pt will stand pivot to tub bench with steadying A and less than 25% cues for technique.  Skilled Therapeutic Interventions/Progress Updates:   Pt transferred from supine to sit EOB with min assist and max demonstrational cueing for technique.  She then transferred stand pivot to the wheelchair with min assist, reporting increased pain in the left knee during transfer.  Therapist rolled pt to the ADL apartment to work on simple meal prep task of making an egg.  Pt was able to verbally state all items needed to complete the task but needed min assist for gathering them with the RW.  Max demonstrational cueing to stay inside the walker and to not get outside of it or too far away. Increased back pain also present with task and pt demonstrating flexed trunk throughout mobility. Pt with motor planning difficulty noted when attempting to advance the LLE during mobility as well as the walker.  She needed mod assist at times to turn the walker and then mod instructional cueing to step into it. During activity of preparing the egg she did not remember to retrieve the butter from the refrigerator and did not remember to turn on the stove.  She was able to prepare the egg in standing and remember to turn the burner off.  She did not recognize the errors made during the task until therapist pointed them out.  Pt returned to wheelchair at end of session and was  transported to the dayroom for SLP session.  Pt received pain meds for the left knee during session which continues to internally distract her as well as affect her mobility.        Therapy Documentation Precautions:  Precautions Precautions: Fall Precaution Comments: apraxia, left hemiparesis Restrictions Weight Bearing Restrictions: No  Pain: Pain Assessment Pain Assessment: 0-10 Pain Score: 4  Faces Pain Scale: Hurts even more Pain Type: Chronic pain Pain Location: Knee Pain Orientation: Left Pain Descriptors / Indicators: Aching Pain Onset: With Activity Pain Intervention(s): Medication (See eMAR) ADL: ADL ADL Comments: refer to functional navigator  See Function Navigator for Current Functional Status.   Therapy/Group: Individual Therapy  Shulamit Donofrio OTR/L 04/02/2017, 3:58 PM

## 2017-04-02 NOTE — Progress Notes (Signed)
Occupational Therapy Session Note  Patient Details  Name: Christina [name] MRN: [id number] Date of Birth: [birth year]  Today's Date: 04/02/2017 OT Individual Time: 0930-1015 OT Individual Time Calculation (min): 45 min    Short Term Goals: Week 1:  OT Short Term Goal 1 (Week 1): Pt will demonstrate improved awareness of L side by donning shirt over LUE with min cues. OT Short Term Goal 1 - Progress (Week 1): Met OT Short Term Goal 2 (Week 1): Pt will demonstrate improved standing balance to stand with UE support with min A as she pulls pants over her hips. OT Short Term Goal 2 - Progress (Week 1): Met OT Short Term Goal 3 (Week 1): Pt will demonstrate improved praxis by completing a stand pivot transfer to her L with min to mod A.  OT Short Term Goal 3 - Progress (Week 1): Met OT Short Term Goal 4 (Week 1): Pt will be able to self cleanse post toileting with steadying assist and 2-3 verbal cues.  OT Short Term Goal 4 - Progress (Week 1): Met Week 2:  OT Short Term Goal 1 (Week 2): Pt will be able to donn pants over L foot with min cues demonstrating improved praxis. OT Short Term Goal 2 (Week 2): Pt will be able to pull pants over her hips with steadying A for balance.  OT Short Term Goal 3 (Week 2): Pt will shower with min A. OT Short Term Goal 4 (Week 2): Pt will stand pivot to tub bench with steadying A and less than 25% cues for technique.  Skilled Therapeutic Interventions/Progress Updates:    Pt seen for ADL retraining to include shower and dressing with a focus on L side awareness and balance.  Pt was very upset about having to be on suicide precautions, stating " I wasn't serious before, I didn't mean it". Was able to calm pt and use distraction so she could focus on her self care.  Pt needs max verbal cues with tactile touch to advance L foot forward with transfers or to step foot back towards chair. She has difficulty motor planning.  She did demonstrate improved postural control with  standing in shower and when she was pulling pants over hips.  Pt resting in w/c with daughter and sitter in the room with her.  Therapy Documentation Precautions:  Precautions Precautions: Fall Precaution Comments: apraxia, left hemiparesis Restrictions Weight Bearing Restrictions: No  Pain: Pain Assessment Pain Assessment: 0-10 Pain Score: 6  Pain Type: Chronic pain Pain Location: Knee Pain Orientation: Left Pain Descriptors / Indicators: Aching Pain Onset: With Activity Pain Intervention(s): Ambulation/increased activity;Rest ADL: ADL ADL Comments: refer to functional navigator  See Function Navigator for Current Functional Status.   Therapy/Group: Individual Therapy  [place] 04/02/2017, 11:25 AM

## 2017-04-03 ENCOUNTER — Inpatient Hospital Stay (HOSPITAL_COMMUNITY): Payer: Medicaid Other | Admitting: Speech Pathology

## 2017-04-03 ENCOUNTER — Inpatient Hospital Stay (HOSPITAL_COMMUNITY): Payer: Medicaid Other

## 2017-04-03 ENCOUNTER — Inpatient Hospital Stay (HOSPITAL_COMMUNITY): Payer: Medicaid Other | Admitting: Physical Therapy

## 2017-04-03 ENCOUNTER — Inpatient Hospital Stay (HOSPITAL_COMMUNITY): Payer: Self-pay | Admitting: Occupational Therapy

## 2017-04-03 DIAGNOSIS — Z79899 Other long term (current) drug therapy: Secondary | ICD-10-CM

## 2017-04-03 LAB — BASIC METABOLIC PANEL
ANION GAP: 8 (ref 5–15)
BUN: 29 mg/dL — ABNORMAL HIGH (ref 6–20)
CALCIUM: 9.3 mg/dL (ref 8.9–10.3)
CO2: 26 mmol/L (ref 22–32)
CREATININE: 1.3 mg/dL — AB (ref 0.44–1.00)
Chloride: 107 mmol/L (ref 101–111)
GFR, EST AFRICAN AMERICAN: 47 mL/min — AB (ref >60)
GFR, EST NON AFRICAN AMERICAN: 41 mL/min — AB (ref >60)
GLUCOSE: 108 mg/dL — AB (ref 65–99)
Potassium: 4.3 mmol/L (ref 3.5–5.1)
Sodium: 141 mmol/L (ref 135–145)

## 2017-04-03 MED ORDER — FLUOXETINE HCL 20 MG PO CAPS
20.0000 mg | ORAL_CAPSULE | Freq: Every day | ORAL | Status: DC
  Administered 2017-04-03 – 2017-04-05 (×3): 20 mg via ORAL
  Filled 2017-04-03 (×3): qty 1

## 2017-04-03 MED ORDER — HYDROCHLOROTHIAZIDE 12.5 MG PO CAPS
12.5000 mg | ORAL_CAPSULE | Freq: Once | ORAL | Status: AC
  Administered 2017-04-03: 12.5 mg via ORAL
  Filled 2017-04-03: qty 1

## 2017-04-03 MED ORDER — HYDROCHLOROTHIAZIDE 25 MG PO TABS
25.0000 mg | ORAL_TABLET | Freq: Every day | ORAL | Status: DC
  Administered 2017-04-04 – 2017-04-05 (×2): 25 mg via ORAL
  Filled 2017-04-03 (×2): qty 1

## 2017-04-03 NOTE — Progress Notes (Signed)
Physical Therapy Session Note  Patient Details  Name: Christina [name] MRN: [id number] Date of Birth: 1946/12/01  Today's Date: 04/03/2017 PT Individual Time: 1000-1055 PT Individual Time Calculation (min): 55 min   Short Term Goals: Week 2:  PT Short Term Goal 1 (Week 2): = LTGs due to anticipated LOS  Skilled Therapeutic Interventions/Progress Updates:   Patient in wheelchair with daughter present. Patient keeping LUE on sink in room and required max cues on two occasions to remove hand from sink and place in lap. Session focused on continued family training with patient and daughter for sit <> stand transfers with max cues for sequencing RW, backing up to surface, and safe hand placement with supervision, gait 2 x 200 ft using RW with supervision and max cues for keeping body inside RW and attention to task, and introduction of HEP for L NMR and generalized strengthening and standing balance. Patient instructed in seated LAQ x 10 each LE, standing marching using RW x 20, standing knee flexion using RW x 10 each LE, and attempted squats but transitioned to sit <> stand transfers x 10 without AD with focus on slow and controlled movement. Patient required max multimodal cues for sequencing and technique and attention to task. Gait without AD x 15 ft with close supervision and max cues for sequencing turning to sit safely. Performed NuStep using BUE/BLE at level 4 x 10 min for NMR and reciprocal pattern retraining. Patient unable to use NuStep using BLE only. In room, patient's clothing and belongings returned to her and suicide precautions lifted. Daughter assisted patient with UB and LB dressing with cues to allow patient to attempt task first before assisting her. Patient left sitting in wheelchair with daughter present, daughter verbalized understanding to notify staff if she plans to leave patient's room. Patient and daughter report feeling comfortable with upcoming discharge and daughter plans to be  here with her daughter tomorrow for completing family training.   Therapy Documentation Precautions:  Precautions Precautions: Fall Precaution Comments: apraxia, left hemiparesis Restrictions Weight Bearing Restrictions: No Pain: Pain Assessment Pain Assessment: Faces Pain Score: 5  Faces Pain Scale: Hurts a little bit Pain Type: Chronic pain Pain Location: Knee Pain Orientation: Left Pain Descriptors / Indicators: Aching Pain Onset: With Activity Pain Intervention(s): Ambulation/increased activity;Rest  See Function Navigator for Current Functional Status.   Therapy/Group: Individual Therapy  Briana Farner, Prudencio Pair 04/03/2017, 10:59 AM
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
## 2017-04-03 NOTE — Progress Notes (Signed)
Speech Language Pathology Daily Session Note  Patient Details  Name: Christina [name] MRN: [id number] Date of Birth: 10-19-1946  Today's Date: 04/03/2017 SLP Individual Time: 1430-1500 SLP Individual Time Calculation (min): 30 min  Short Term Goals: Week 3: SLP Short Term Goal 1 (Week 3): Patient will utilize external aids for orientation to place, time and siutation with Mod A verbal and visual cues.  SLP Short Term Goal 2 (Week 3): Patient will demonstrate sustained attention to a functional task for 15 minutes with Min A verbal cues.  SLP Short Term Goal 3 (Week 3): Patient will demonstrate functional problem solving for basic and familiar tasks with Mod A multimodal cues.  SLP Short Term Goal 4 (Week 3): Patient will identify 1 physical and 1 cognitive changes with Mod A verbal cues.   Skilled Therapeutic Interventions: Skilled treatment session focused on cognition goals. SLP facilitated session by providing Mod for functional problem solving for basic task. Pt with decreased control over right arm/hand movements this session with decreased awareness that her hand was holding onto sink, table, cup or wheelchair. Pt with decreased sustained attention to verbal cues to move hand so SLP provided tactile support. Pt was excited to report that she "didn't need someone to watch her" and that she was discharging to "[name] house on Saturday." Pt further stated that she was thankful for the opportunity to live with [name]. Pt was returned to room, left upright in wheelchair, safety belt donned and all needs within reach. Grad day is 04/04/17.      Function:    Cognition Comprehension Comprehension assist level: Understands basic 50 - 74% of the time/ requires cueing 25 - 49% of the time  Expression   Expression assist level: Expresses basic 75 - 89% of the time/requires cueing 10 - 24% of the time. Needs helper to occlude trach/needs to repeat words.  Social Interaction Social Interaction assist  level: Interacts appropriately 75 - 89% of the time - Needs redirection for appropriate language or to initiate interaction.  Problem Solving Problem solving assist level: Solves basic 50 - 74% of the time/requires cueing 25 - 49% of the time  Memory Memory assist level: Recognizes or recalls 50 - 74% of the time/requires cueing 25 - 49% of the time    Pain    Therapy/Group: Individual Therapy   Fransheska Willingham B. Dreama Saa, M.S., CCC-SLP Speech-Language Pathologist   Reginaldo Hazard 04/03/2017, 3:40 PM

## 2017-04-03 NOTE — Progress Notes (Signed)
Occupational Therapy Session Note  Patient Details  Name: Christina [name] MRN: [id number] Date of Birth: 1946/08/21  Today's Date: 04/03/2017 OT Individual Time: 4098-1191 OT Individual Time Calculation (min): 58 min    Short Term Goals: Week 2:  OT Short Term Goal 1 (Week 2): Pt will be able to donn pants over L foot with min cues demonstrating improved praxis. OT Short Term Goal 2 (Week 2): Pt will be able to pull pants over her hips with steadying A for balance.  OT Short Term Goal 3 (Week 2): Pt will shower with min A. OT Short Term Goal 4 (Week 2): Pt will stand pivot to tub bench with steadying A and less than 25% cues for technique.  Skilled Therapeutic Interventions/Progress Updates:    Pt completed shower and dressing during session.  Max demonstrational cueing for hand placement for sit to stand as she attempts to pull up from the wheelchair by using the sink to pull on or the walker.  Min assist for transfer to the walk-in shower secondary to therapist having to help direct the walker.  Once in the shower she was able to complete bathing with supervision, using grab bars for support in standing.  Mod instructional cueing to sequence both bathing and dressing tasks, following hemidressing techniques.  Pt still demonstrates some decreased initiation of RUE at times secondary to motor planning but will use it spontaneously for bathing tasks.  She tends to use it mostly for stabilization and then does not let go of the surface she is holding onto, requiring cueing and sometimes physical assist.  Still with left knee pain but not as distracting this session. Pt left with NT at end of session in wheelchair.   Therapy Documentation Precautions:  Precautions Precautions: Fall Precaution Comments: apraxia, left hemiparesis Restrictions Weight Bearing Restrictions: No  Pain: Pain Assessment Pain Assessment: Faces Faces Pain Scale: Hurts a little bit Pain Type: Chronic pain Pain  Location: Knee Pain Orientation: Left Pain Onset: With Activity Pain Intervention(s): Repositioned;Emotional support ADL: ADL ADL Comments: refer to functional navigator  See Function Navigator for Current Functional Status.   Therapy/Group: Individual Therapy  Evan Mackie OTR/L 04/03/2017, 8:59 AM

## 2017-04-03 NOTE — Progress Notes (Signed)
Physical Therapy Note  Patient Details  Name: Christina [name] MRN: [id number] Date of Birth: 15-Nov-1946 Today's Date: 04/03/2017  1510-1610, 60 min individual tx Pain: pt shows pain with grimacing during stand pivot transfer, stating her low back hurts, but will not rate it, and refused meds  Toilet trasnfer in room for continent void.  Neuromuscular re-education via forced use, positioning, multimodal cues for alternating reciprocal movement x 4 extremities on NuStep at level 5 x 7 minutes.  Min cues to attend to L visually on screen read out of NuStep.  Therapeutic activity of standing without UE support during task of folding linen on table in front of her, x 6 minutes.  Pt attended very well to task generally, and used bil hands spontaneously, functionally, but used 3 different folding patterns for 7 pillow cases.  After stacking them on L, pt stated "the bottom one is bigger". Gait with grocery cart for improved fluidity and increased velocity, x 210' with close supervision, mod cues to avoid pushing cart too far ahead.  Pt left resting in w/c with quick release belt applied and all needs within reach.  See function navigator for current status.   Andreya Lacks 04/03/2017, 3:09 PM

## 2017-04-03 NOTE — Progress Notes (Signed)
Subjective/Complaints: Pt slept well overnight.  She states her knee pain has improved with medications.  When asked about the weekend and shoelaces, she does not recall event.   ROS: Denies SI, CP, SOB, N/V/D  Objective: Vital Signs: Blood pressure (!) 141/60, pulse (!) 57, temperature 98.6 F (37 C), temperature source Oral, resp. rate 18, weight 65.2 kg (143 lb 13.6 oz), SpO2 100 %. No results found. Results for orders placed or performed during the hospital encounter of 03/19/17 (from the past 72 hour(s))  Basic metabolic panel     Status: Abnormal   Collection Time: 03/31/17  9:30 AM  Result Value Ref Range   Sodium 135 135 - 145 mmol/L   Potassium 3.3 (L) 3.5 - 5.1 mmol/L   Chloride 101 101 - 111 mmol/L   CO2 25 22 - 32 mmol/L   Glucose, Bld 154 (H) 65 - 99 mg/dL   BUN 29 (H) 6 - 20 mg/dL   Creatinine, Ser 1.73 (H) 0.44 - 1.00 mg/dL   Calcium 9.5 8.9 - 10.3 mg/dL   GFR calc non Af Amer 29 (L) >60 mL/min   GFR calc Af Amer 33 (L) >60 mL/min    Comment: (NOTE) The eGFR has been calculated using the CKD EPI equation. This calculation has not been validated in all clinical situations. eGFR's persistently <60 mL/min signify possible Chronic Kidney Disease.    Anion gap 9 5 - 15  CBC with Differential/Platelet     Status: Abnormal   Collection Time: 03/31/17  9:30 AM  Result Value Ref Range   WBC 5.2 4.0 - 10.5 K/uL   RBC 3.83 (L) 3.87 - 5.11 MIL/uL   Hemoglobin 10.5 (L) 12.0 - 15.0 g/dL   HCT 32.8 (L) 36.0 - 46.0 %   MCV 85.6 78.0 - 100.0 fL   MCH 27.4 26.0 - 34.0 pg   MCHC 32.0 30.0 - 36.0 g/dL   RDW 13.9 11.5 - 15.5 %   Platelets 192 150 - 400 K/uL   Neutrophils Relative % 63 %   Neutro Abs 3.3 1.7 - 7.7 K/uL   Lymphocytes Relative 22 %   Lymphs Abs 1.1 0.7 - 4.0 K/uL   Monocytes Relative 9 %   Monocytes Absolute 0.5 0.1 - 1.0 K/uL   Eosinophils Relative 5 %   Eosinophils Absolute 0.3 0.0 - 0.7 K/uL   Basophils Relative 1 %   Basophils Absolute 0.0 0.0 - 0.1  K/uL  Basic metabolic panel     Status: Abnormal   Collection Time: 04/03/17  5:58 AM  Result Value Ref Range   Sodium 141 135 - 145 mmol/L   Potassium 4.3 3.5 - 5.1 mmol/L   Chloride 107 101 - 111 mmol/L   CO2 26 22 - 32 mmol/L   Glucose, Bld 108 (H) 65 - 99 mg/dL   BUN 29 (H) 6 - 20 mg/dL   Creatinine, Ser 1.30 (H) 0.44 - 1.00 mg/dL   Calcium 9.3 8.9 - 10.3 mg/dL   GFR calc non Af Amer 41 (L) >60 mL/min   GFR calc Af Amer 47 (L) >60 mL/min    Comment: (NOTE) The eGFR has been calculated using the CKD EPI equation. This calculation has not been validated in all clinical situations. eGFR's persistently <60 mL/min signify possible Chronic Kidney Disease.    Anion gap 8 5 - 15     Gen NAD. Vital signs reviewed. HEENT: Normocephalic, atraumatic.  Cardio: RRR. No JVD. Resp: CTA B/L and unlabored GI: BS  positive and NT Musc/Skel:  No TTP left knee Neuro: Alert Motor RUE 5/5 RLE 4/5 HF, 4+/5 KE, ADF/PF (stable) LUE 4+/5 LLE 3/5 HF, 4+/5 KE, 2/5 ADF/PF Skin:   Intact. Warm and dry  Assessment/Plan: 1. Functional deficits secondary to Left MCA which require 3+ hours per day of interdisciplinary therapy in a comprehensive inpatient rehab setting. Physiatrist is providing close team supervision and 24 hour management of active medical problems listed below. Physiatrist and rehab team continue to assess barriers to discharge/monitor patient progress toward functional and medical goals. FIM: Function - Bathing Position: Shower Body parts bathed by patient: Right arm, Left arm, Chest, Abdomen, Right upper leg, Left upper leg, Front perineal area, Buttocks, Right lower leg, Left lower leg Body parts bathed by helper: Back Bathing not applicable: Right lower leg, Left lower leg, Back Assist Level: Touching or steadying assistance(Pt > 75%)  Function- Upper Body Dressing/Undressing What is the patient wearing?: Pull over shirt/dress Pull over shirt/dress - Perfomed by patient:  Thread/unthread right sleeve, Thread/unthread left sleeve, Put head through opening Pull over shirt/dress - Perfomed by helper: Pull shirt over trunk Assist Level: Set up Set up : To obtain clothing/put away Function - Lower Body Dressing/Undressing What is the patient wearing?: Underwear, Pants, Non-skid slipper socks Position: Wheelchair/chair at sink Underwear - Performed by patient: Thread/unthread right underwear leg, Thread/unthread left underwear leg, Pull underwear up/down Underwear - Performed by helper: Thread/unthread left underwear leg Pants- Performed by patient: Thread/unthread right pants leg, Pull pants up/down Pants- Performed by helper: Thread/unthread left pants leg Non-skid slipper socks- Performed by patient: Don/doff right sock Non-skid slipper socks- Performed by helper: Don/doff left sock Assist for footwear: Maximal assist Assist for lower body dressing:  (max A)  Function - Toileting Toileting steps completed by patient: Adjust clothing prior to toileting, Performs perineal hygiene, Adjust clothing after toileting Toileting steps completed by helper: Adjust clothing after toileting Toileting Assistive Devices: Grab bar or rail Assist level: Touching or steadying assistance (Pt.75%)  Function - Air cabin crew transfer assistive device: Environmental manager lift: Stedy Assist level to toilet: Touching or steadying assistance (Pt > 75%) Assist level from toilet: Touching or steadying assistance (Pt > 75%) Assist level to bedside commode (at bedside): Touching or steadying assistance (Pt > 75%) Assist level from bedside commode (at bedside): Touching or steadying assistance (Pt > 75%)  Function - Chair/bed transfer Chair/bed transfer method: Ambulatory Chair/bed transfer assist level: Touching or steadying assistance (Pt > 75%) Chair/bed transfer assistive device: Armrests Mechanical lift: Stedy Chair/bed transfer details: Tactile cues for sequencing,  Tactile cues for weight shifting, Tactile cues for placement, Tactile cues for posture, Manual facilitation for weight shifting, Manual facilitation for placement, Verbal cues for sequencing, Verbal cues for technique  Function - Locomotion: Wheelchair Type: Manual Max wheelchair distance: 25 ft Assist Level: Touching or steadying assistance (Pt > 75%) Turns around,maneuvers to table,bed, and toilet,negotiates 3% grade,maneuvers on rugs and over doorsills: No Function - Locomotion: Ambulation Assistive device: Walker-rolling Max distance: 200 ft Assist level: Supervision or verbal cues Assist level: Supervision or verbal cues Assist level: Supervision or verbal cues Walk 150 feet activity did not occur: Safety/medical concerns Assist level: Supervision or verbal cues Walk 10 feet on uneven surfaces activity did not occur: Safety/medical concerns  Function - Comprehension Comprehension: Auditory Comprehension assist level: Understands basic 50 - 74% of the time/ requires cueing 25 - 49% of the time  Function - Expression Expression: Verbal Expression assist level: Expresses basic 75 -  89% of the time/requires cueing 10 - 24% of the time. Needs helper to occlude trach/needs to repeat words.  Function - Social Interaction Social Interaction assist level: Interacts appropriately 75 - 89% of the time - Needs redirection for appropriate language or to initiate interaction.  Function - Problem Solving Problem solving assist level: Solves basic 50 - 74% of the time/requires cueing 25 - 49% of the time  Function - Memory Memory assist level: Recognizes or recalls 25 - 49% of the time/requires cueing 50 - 75% of the time Patient normally able to recall (first 3 days only): Current season, That he or she is in a hospital, Staff names and faces, Location of own room  Medical Problem List and Plan: 1.  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   DVT Prophylaxis/Anticoagulation: Eliquis 3. Pain Management: Tylenol as needed 4. Mood: Cont Remeron 7.5 mg daily at bedtime  5. Neuropsych: This patient is capable of making decisions on her own behalf. 6. Skin/Wound Care: Routine skin checks 7. Fluids/Electrolytes/Nutrition: Routine I&Os 8. Hypertension. Coreg 12.5 mg twice a day, verapamil 240 mg daily at bedtime increase to 356m , Cozaar 100 mg daily  HCTZ increased to 12.5 on 4/4, increased to 25 on 4/12 9. Atrial fibrillation. Plan is currently to continue on Eliquis.  10. Tobacco abuse. Counseling 11. Hyperlipidemia. Zocor, will transition to Lipitor at d/c 12. ABLA  Hb 10.5 on 4/9 13. CKD  Now with AKI  Cr 1.30 on 4/12 (improving)  Encourage fluids  IVF qHS 14. Left knee OA  Imaging reviewed  Voltaren gel reordered per pt requestion, lidoderm patch ordered  Will consider steroid injection   Tramadol ordered  Improving 15. Sleep disturbance  Trazodone when necessary ordered 4/8  16. SI  Unclear if this was genuine SI.  Currently on precautions, however, pt denies ideation at present and does not recall event.  Daughter also doubtful of genuine SI.    Unable able to move pt close to nurse station and has not been seen by Psych.  Cont precaution, especially given IVF.  Appreciate Neuropsych eval  Psych to see patient today, await eval 17. Hypokalemia  K+ 4.3 on 4/12  Supplement started 4/9  LOS (Days) 15 A FACE TO FACE EVALUATION WAS PERFORMED  Verneal Wiers ALorie Phenix4/11/2017, 8:35 AM

## 2017-04-04 ENCOUNTER — Encounter (HOSPITAL_COMMUNITY): Payer: Self-pay | Admitting: Occupational Therapy

## 2017-04-04 ENCOUNTER — Inpatient Hospital Stay (HOSPITAL_COMMUNITY): Payer: Medicaid Other | Admitting: Physical Therapy

## 2017-04-04 ENCOUNTER — Inpatient Hospital Stay (HOSPITAL_COMMUNITY): Payer: Medicaid Other | Admitting: Speech Pathology

## 2017-04-04 MED ORDER — SIMVASTATIN 20 MG PO TABS: 20.0000 mg | ORAL_TABLET | Freq: Every day | ORAL | 1 refills | Status: DC

## 2017-04-04 MED ORDER — LIDOCAINE 5 % EX PTCH: 1.0000 | MEDICATED_PATCH | CUTANEOUS | 0 refills | Status: DC

## 2017-04-04 MED ORDER — TRAMADOL HCL 50 MG PO TABS: 100.0000 mg | ORAL_TABLET | Freq: Two times a day (BID) | ORAL | 0 refills | Status: DC | PRN

## 2017-04-04 MED ORDER — HYDROCHLOROTHIAZIDE 25 MG PO TABS: 25.0000 mg | ORAL_TABLET | Freq: Every day | ORAL | 1 refills | Status: DC

## 2017-04-04 MED ORDER — LATANOPROST 0.005 % OP SOLN: 1.0000 [drp] | OPHTHALMIC | 12 refills | Status: AC

## 2017-04-04 MED ORDER — DICLOFENAC SODIUM 1 % TD GEL: 2.0000 g | Freq: Four times a day (QID) | TRANSDERMAL | 1 refills | Status: DC

## 2017-04-04 MED ORDER — LOSARTAN POTASSIUM 100 MG PO TABS: 100.0000 mg | ORAL_TABLET | Freq: Every day | ORAL | 1 refills | Status: DC

## 2017-04-04 MED ORDER — MIRTAZAPINE 15 MG PO TBDP: 7.5000 mg | ORAL_TABLET | Freq: Every day | ORAL | 1 refills | Status: DC

## 2017-04-04 MED ORDER — POTASSIUM CHLORIDE CRYS ER 20 MEQ PO TBCR: 20.0000 meq | EXTENDED_RELEASE_TABLET | Freq: Every day | ORAL | 1 refills | Status: DC

## 2017-04-04 MED ORDER — VERAPAMIL HCL ER 180 MG PO TBCR: 360.0000 mg | EXTENDED_RELEASE_TABLET | Freq: Every day | ORAL | 1 refills | Status: DC

## 2017-04-04 MED ORDER — CARVEDILOL 12.5 MG PO TABS: 12.5000 mg | ORAL_TABLET | Freq: Two times a day (BID) | ORAL | 1 refills | Status: DC

## 2017-04-04 MED ORDER — APIXABAN 5 MG PO TABS: 5.0000 mg | ORAL_TABLET | Freq: Two times a day (BID) | ORAL | 1 refills | Status: DC

## 2017-04-04 MED ORDER — FLUOXETINE HCL 20 MG PO CAPS: 20.0000 mg | ORAL_CAPSULE | Freq: Every day | ORAL | 3 refills | Status: DC

## 2017-04-04 NOTE — Progress Notes (Signed)
Speech Language Pathology Discharge Summary  Patient Details  Name: Christina [name] MRN: [id number] Date of Birth: 05/12/1946  Today's Date: 04/04/2017 SLP Individual Time: 1300-1400 SLP Individual Time Calculation (min): 60 min   Skilled Therapeutic Interventions:  Skilled treatment session focused on cognition goals. SLP facilitated session by providing Mod A multimodal cues for completion of simple money counting tasks. SLP further facilitated session by providing supervision cues for safe transfer to toilet. Pt required supervision reminders to watch/monitor LLUE.  Session finished in room with Mod A to create list of activities that pt can help daughter with in home environment. Pt left upright in wheelchair, safety belt donned and all needs within reach.     Patient has met 5 of 5 long term goals.  Patient to discharge at overall Mod level.    Clinical Impression/Discharge Summary:  Pt has made progress in ST session and as a result as met 5 of 5 LTGs. She has made progress in the areas of attention, problem solving, orientation and memory. Pt currently requires Mod A supervision and would benefit from follow up ST services to further increase functional independence within home environment. All education completed with pt and daughter. All questions answered to pt and daughter satisfaction.   Care Partner:  Caregiver Able to Provide Assistance: Yes  Type of Caregiver Assistance: Physical;Cognitive  Recommendation:  Home Health SLP;Outpatient SLP;24 hour supervision/assistance  Rationale for SLP Follow Up: Maximize cognitive function and independence;Reduce caregiver burden   Equipment: N/A   Reasons for discharge: Treatment goals met   Patient/Family Agrees with Progress Made and Goals Achieved: Yes   Function:    Cognition Comprehension Comprehension assist level: Understands basic 50 - 74% of the time/ requires cueing 25 - 49% of the time  Expression   Expression assist  level: Expresses basic 75 - 89% of the time/requires cueing 10 - 24% of the time. Needs helper to occlude trach/needs to repeat words.  Social Interaction Social Interaction assist level: Interacts appropriately 75 - 89% of the time - Needs redirection for appropriate language or to initiate interaction.  Problem Solving Problem solving assist level: Solves basic 50 - 74% of the time/requires cueing 25 - 49% of the time  Memory Memory assist level: Recognizes or recalls 25 - 49% of the time/requires cueing 50 - 75% of the time   Happi B. Overton, M.S., CCC-SLP Speech-Language Pathologist  Happi Overton 04/04/2017, 1:32 PM    

## 2017-04-04 NOTE — Plan of Care (Signed)
Problem: RH Dressing Goal: LTG Patient will perform lower body dressing w/assist (OT) LTG: Patient will perform lower body dressing with assist, with/without cues in positioning using equipment (OT)  Outcome: Not Met (add Reason) Needs min assist for donning right shoe.  Problem: RH Memory Goal: LTG Patient will demonstrate ability for day to day (OT) LTG:  Patient will demonstrate ability for day to day recall/carryover during activities of daily living with assist  (OT)  Outcome: Not Met (add Reason) Needs max assist  Problem: RH Attention Goal: LTG Patient will demonstrate focused/sustained (OT) LTG:  Patient will demonstrate focused/sustained/selective/alternating/divided attention during functional activities in specific environment with assist for # of minutes  (OT)  Outcome: Not Met (add Reason) Needs mod to max assist for selective attention..  Problem: RH Awareness Goal: LTG: Patient will demonstrate intellectual/emergent (OT) LTG: Patient will demonstrate intellectual/emergent/anticipatory awareness with assist during a functional activity  (OT)  Outcome: Not Met (add Reason) Mod assist for awareness

## 2017-04-04 NOTE — Progress Notes (Signed)
Physical Therapy Discharge Summary  Patient Details  Name: Christina [name] MRN: [id number] Date of Birth: May 23, 1946  Today's Date: 04/04/2017 PT Individual Time: 9371-6967 PT Individual Time Calculation (min): 69 min   Patient has met 11 of 11 long term goals due to improved activity tolerance, improved balance, improved postural control, increased strength, increased range of motion, decreased pain, ability to compensate for deficits, functional use of  left upper extremity and left lower extremity, improved attention, improved awareness and improved coordination.  Patient to discharge at an ambulatory level Supervision.   Patient's care partner is independent to provide the necessary physical and cognitive assistance at discharge.  Reasons goals not met: NA  Recommendation:  Patient will benefit from ongoing skilled PT services in home health setting to continue to advance safe functional mobility, address ongoing impairments in L NMR, standing balance, coordination, attention, functional recall, awareness, activity tolerance, and minimize fall risk.  Equipment: RW, transport chair  Reasons for discharge: treatment goals met and discharge from hospital  Patient/family agrees with progress made and goals achieved: Yes  PT Discharge Precautions/Restrictions Precautions Precautions: Fall Precaution Comments: apraxia, left hemiparesis Pain Pain Assessment Pain Assessment: No/denies pain Vision/Perception  Vision - Assessment Eye Alignment: Within Functional Limits Ocular Range of Motion: Within Functional Limits Alignment/Gaze Preference: Within Defined Limits Tracking/Visual Pursuits: Decreased smoothness of horizontal tracking;Other (comment) (Pt with decreased tracking horizontally at times would shoot ahead of target with rapid movement.) Perception Comments: Pt at times demonstrates difficulty accessing the LUE for functional use.  She will be holding something and will exhibit  decreased awareness with decreased ability to spontaneously let it go.  Praxis Praxis-Other Comments: Decreased consistency with attempts to lift the LLE or utilize the LUE during selfcare tasks.   Cognition Overall Cognitive Status: Impaired/Different from baseline Arousal/Alertness: Awake/alert Orientation Level: Oriented X4 Attention: Sustained Focused Attention: Appears intact Sustained Attention: Impaired Sustained Attention Impairment: Verbal complex;Functional complex Selective Attention: Impaired Selective Attention Impairment: Verbal complex;Functional complex Memory: Impaired Memory Impairment: Storage deficit;Decreased recall of new information Decreased Short Term Memory: Verbal complex;Verbal basic;Functional basic;Functional complex Awareness: Impaired Awareness Impairment: Emergent impairment;Anticipatory impairment Problem Solving: Impaired Problem Solving Impairment: Functional basic Executive Function: Sequencing;Initiating Sequencing: Impaired Sequencing Impairment: Functional basic Initiating: Impaired Initiating Impairment: Verbal complex;Functional complex Behaviors: Impulsive Safety/Judgment: Impaired Comments: Pt with multiple attempts to stand in the shower, requring max instructional cueing to not stand up.  Decreased carryover from session to session with regards to hand placement for sit to stand, staying inside of the walker with transfers, and squaring up to the surface before sitting.   Sensation Sensation Light Touch: Appears Intact Stereognosis: Appears Intact Hot/Cold: Appears Intact Proprioception: Impaired Detail Proprioception Impaired Details: Impaired LUE;Impaired LLE Coordination Gross Motor Movements are Fluid and Coordinated: No Fine Motor Movements are Fluid and Coordinated: No Coordination and Movement Description: apraxic LUE, decreased awareness of L side of body, limited by chronic back and L knee pain Motor  Motor Motor:  Hemiplegia;Motor apraxia;Abnormal tone;Abnormal postural alignment and control  Mobility Bed Mobility Bed Mobility: Rolling Right;Rolling Left;Sit to Supine;Supine to Sit Rolling Right: 5: Supervision Rolling Left: 5: Supervision Supine to Sit: 5: Supervision;HOB flat Sit to Supine: 5: Supervision;HOB flat Transfers Transfers: Yes Sit to Stand: 5: Supervision Sit to Stand Details: Tactile cues for placement;Tactile cues for sequencing Stand to Sit: 5: Supervision Stand to Sit Details (indicate cue type and reason): Tactile cues for sequencing;Tactile cues for placement Stand Pivot Transfers: 5: Supervision Locomotion  Ambulation Ambulation: Yes Ambulation/Gait Assistance: 5:  Supervision Ambulation Distance (Feet): 300 Feet Assistive device: Rolling walker Gait Gait: Yes Gait Pattern: Impaired Gait Pattern: Step-through pattern;Decreased stride length;Trunk flexed;Trunk rotated posteriorly on left Gait velocity: 10 MWT = 0.3 m/s  Stairs / Additional Locomotion Stairs: Yes Stairs Assistance: 5: Supervision Stair Management Technique: Two rails;Step to pattern;Alternating pattern;Forwards Number of Stairs: 12 Height of Stairs: 3 (8 3", 4 6") Ramp: 5: Supervision Wheelchair Mobility Wheelchair Mobility: No  Trunk/Postural Assessment  Cervical Assessment Cervical Assessment: Within Functional Limits Thoracic Assessment Thoracic Assessment: Within Functional Limits Lumbar Assessment Lumbar Assessment: Exceptions to [facility] (posterior pelvic tilt in sitting) Postural Control Postural Control: Deficits on evaluation Protective Responses: delayed  Balance Balance Balance Assessed: Yes Static Sitting Balance Static Sitting - Balance Support: No upper extremity supported;Feet supported Static Sitting - Level of Assistance: 7: Independent Dynamic Sitting Balance Dynamic Sitting - Balance Support: No upper extremity supported;During functional activity Dynamic Sitting - Level of  Assistance: 5: Stand by assistance Static Standing Balance Static Standing - Balance Support: During functional activity Static Standing - Level of Assistance: 5: Stand by assistance Dynamic Standing Balance Dynamic Standing - Balance Support: During functional activity;Right upper extremity supported;Left upper extremity supported Dynamic Standing - Level of Assistance: 5: Stand by assistance  Five times Sit to Stand Test (FTSS) Method: Use a straight back chair with a solid seat that is 16-18" high. Ask participant to sit on the chair with arms folded across their chest.   Instructions: "Stand up and sit down as quickly as possible 5 times, keeping your arms folded across your chest."   Measurement: Stop timing when the participant stands the 5th time.  TIME: __33 with LUE support____ (in seconds)  Times > 13.6 seconds is associated with increased disability and morbidity (Guralnik, 2000) Times > 15 seconds is predictive of recurrent falls in healthy individuals aged 39 and older (Buatois, et al., 2008) Normal performance values in community dwelling individuals aged 45 and older (Bohannon, 2006): o 60-69 years: 11.4 seconds o 70-79 years: 12.6 seconds o 80-89 years: 14.8 seconds  MCID: ? 2.3 seconds for Vestibular Disorders (Meretta, 2006) Extremity Assessment  RUE Assessment RUE Assessment: Within Functional Limits LUE Assessment LUE Assessment: Exceptions to [facility] LUE Strength LUE Overall Strength Comments: Pt with slight decreased AROM shoulder flexion noted 0- 130 degrees.  Strength at the shoulder, elbow, and grip 4/5 throughout.  She demonstrates decreased motor planning at times with decreased ability to let go of objects or surfaces at times.  RLE Assessment RLE Assessment: Within Functional Limits LLE Assessment LLE Assessment: Exceptions to [facility] LLE Strength LLE Overall Strength: Deficits LLE Overall Strength Comments: grossly 3+ to 4/5 throughout   See Function  Navigator for Current Functional Status.  Eola Waldrep, Murray Hodgkins 04/04/2017, 4:04 PM

## 2017-04-04 NOTE — Progress Notes (Addendum)
Subjective/Complaints: Pt seen sitting up in her chair this AM.  She states her IV hurts and would like it taken out.  She states she slept beautifully.    ROS: Denies SI, CP, SOB, N/V/D  Objective: Vital Signs: Blood pressure (!) 160/72, pulse 71, temperature 98.6 F (37 C), temperature source Oral, resp. rate 17, weight 65.2 kg (143 lb 13.6 oz), SpO2 97 %. No results found. Results for orders placed or performed during the hospital encounter of 03/19/17 (from the past 72 hour(s))  Basic metabolic panel     Status: Abnormal   Collection Time: 04/03/17  5:58 AM  Result Value Ref Range   Sodium 141 135 - 145 mmol/L   Potassium 4.3 3.5 - 5.1 mmol/L   Chloride 107 101 - 111 mmol/L   CO2 26 22 - 32 mmol/L   Glucose, Bld 108 (H) 65 - 99 mg/dL   BUN 29 (H) 6 - 20 mg/dL   Creatinine, Ser 1.30 (H) 0.44 - 1.00 mg/dL   Calcium 9.3 8.9 - 10.3 mg/dL   GFR calc non Af Amer 41 (L) >60 mL/min   GFR calc Af Amer 47 (L) >60 mL/min    Comment: (NOTE) The eGFR has been calculated using the CKD EPI equation. This calculation has not been validated in all clinical situations. eGFR's persistently <60 mL/min signify possible Chronic Kidney Disease.    Anion gap 8 5 - 15     Gen NAD. Vital signs reviewed. HEENT: Normocephalic, atraumatic.  Cardio: RRR. No JVD. Resp: CTA B/L and unlabored GI: BS positive and NT Musc/Skel:  No TTP left knee Neuro: Alert and oriented x1 with cues.  Motor RUE 5/5 RLE 4/5 HF, 4+/5 KE, ADF/PF (unchanged) LUE 4+/5 LLE 3/5 HF, 4+/5 KE, 2/5 ADF/PF Skin:   Intact. Warm and dry  Assessment/Plan: 1. Functional deficits secondary to Left MCA which require 3+ hours per day of interdisciplinary therapy in a comprehensive inpatient rehab setting. Physiatrist is providing close team supervision and 24 hour management of active medical problems listed below. Physiatrist and rehab team continue to assess barriers to discharge/monitor patient progress toward functional and  medical goals. FIM: Function - Bathing Position: Shower Body parts bathed by patient: Right arm, Left arm, Chest, Abdomen, Right upper leg, Left upper leg, Front perineal area, Buttocks, Right lower leg, Left lower leg Body parts bathed by helper: Back Bathing not applicable: Right lower leg, Left lower leg, Back Assist Level: Touching or steadying assistance(Pt > 75%)  Function- Upper Body Dressing/Undressing What is the patient wearing?: Pull over shirt/dress Pull over shirt/dress - Perfomed by patient: Thread/unthread right sleeve, Thread/unthread left sleeve, Put head through opening, Pull shirt over trunk Pull over shirt/dress - Perfomed by helper: Pull shirt over trunk Assist Level: Set up Set up : To obtain clothing/put away Function - Lower Body Dressing/Undressing What is the patient wearing?: Underwear, Pants, Non-skid slipper socks Position: Wheelchair/chair at sink Underwear - Performed by patient: Thread/unthread right underwear leg, Thread/unthread left underwear leg, Pull underwear up/down Underwear - Performed by helper: Thread/unthread left underwear leg Pants- Performed by patient: Thread/unthread right pants leg, Pull pants up/down, Thread/unthread left pants leg Pants- Performed by helper: Thread/unthread left pants leg Non-skid slipper socks- Performed by patient: Don/doff right sock, Don/doff left sock Non-skid slipper socks- Performed by helper: Don/doff left sock Assist for footwear: Maximal assist Assist for lower body dressing:  (max A)  Function - Toileting Toileting steps completed by patient: Performs perineal hygiene, Adjust clothing after toileting Toileting  steps completed by helper: Adjust clothing prior to toileting Toileting Assistive Devices: Grab bar or rail Assist level: Touching or steadying assistance (Pt.75%)  Function - Toilet Transfers Toilet transfer assistive device: Grab bar Mechanical lift: Stedy Assist level to toilet: Touching or  steadying assistance (Pt > 75%) Assist level from toilet: Touching or steadying assistance (Pt > 75%) Assist level to bedside commode (at bedside): Touching or steadying assistance (Pt > 75%) Assist level from bedside commode (at bedside): Touching or steadying assistance (Pt > 75%)  Function - Chair/bed transfer Chair/bed transfer method: Ambulatory Chair/bed transfer assist level: Supervision or verbal cues Chair/bed transfer assistive device: Armrests, Environmental manager lift: Stedy Chair/bed transfer details: Verbal cues for safe use of DME/AE, Verbal cues for technique  Function - Locomotion: Wheelchair Type: Manual Max wheelchair distance: 25 ft Assist Level: Touching or steadying assistance (Pt > 75%) Turns around,maneuvers to table,bed, and toilet,negotiates 3% grade,maneuvers on rugs and over doorsills: No Function - Locomotion: Ambulation Assistive device: Other (comment) (grocery cart) Max distance: 210 Assist level: Supervision or verbal cues Assist level: Supervision or verbal cues Assist level: Supervision or verbal cues Walk 150 feet activity did not occur: Safety/medical concerns Assist level: Supervision or verbal cues Walk 10 feet on uneven surfaces activity did not occur: Safety/medical concerns  Function - Comprehension Comprehension: Auditory Comprehension assist level: Understands basic 50 - 74% of the time/ requires cueing 25 - 49% of the time  Function - Expression Expression: Verbal Expression assist level: Expresses basic 75 - 89% of the time/requires cueing 10 - 24% of the time. Needs helper to occlude trach/needs to repeat words.  Function - Social Interaction Social Interaction assist level: Interacts appropriately 75 - 89% of the time - Needs redirection for appropriate language or to initiate interaction.  Function - Problem Solving Problem solving assist level: Solves basic 25 - 49% of the time - needs direction more than half the time to  initiate, plan or complete simple activities  Function - Memory Memory assist level: Recognizes or recalls 50 - 74% of the time/requires cueing 25 - 49% of the time Patient normally able to recall (first 3 days only): Current season, That he or she is in a hospital, Staff names and faces, Location of own room  Medical Problem List and Plan: 1.  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  DVT Prophylaxis/Anticoagulation: Eliquis 3. Pain Management: Tylenol as needed 4. Mood: Cont Remeron 7.5 mg daily at bedtime  5. Neuropsych: This patient is capable of making decisions on her own behalf. 6. Skin/Wound Care: Routine skin checks 7. Fluids/Electrolytes/Nutrition: Routine I&Os 8. Hypertension. Coreg 12.5 mg twice a day, verapamil 240 mg daily at bedtime increase to 361m , Cozaar 100 mg daily  HCTZ increased to 12.5 on 4/4, increased to 25 on 4/12  Cont to monitor, will likely need further adjustments as outpt 9. Atrial fibrillation. Plan is currently to continue on Eliquis.  10. Tobacco abuse. Counseling 11. Hyperlipidemia. Zocor, will transition to Lipitor at d/c 12. ABLA  Hb 10.5 on 4/9 13. CKD  Now with AKI  Cr 1.30 on 4/12 (improving)  Encourage fluids  IVF qHS d/ced 4/13  Will need labs at follow up 14. Left knee OA  Imaging reviewed  Voltaren gel reordered per pt requestion, lidoderm patch ordered  Will consider steroid injection   Tramadol ordered  Improving 15. Sleep disturbance  Trazodone when necessary ordered 4/8   Improving  16. SI  Unclear if this was genuine SI.  Currently on precautions, however, pt denies ideation at present and does not recall event.  Daughter also doubtful of genuine SI.    Unable able to move pt close to nurse station and has not been seen by Psych.  Cont precaution, especially given  IVF.  Appreciate Neuropsych and Psych eval - in agreement, no imminent risk to self, suicide precautions d/ced 17. Hypokalemia  K+ 4.3 on 4/12  Supplement started 4/9  LOS (Days) 16 A FACE TO FACE EVALUATION WAS PERFORMED  Haydee Jabbour Lorie Phenix 04/04/2017, 8:26 AM

## 2017-04-04 NOTE — Discharge Summary (Signed)
Christina [name], CREPEAU NO.:  [id number]  MEDICAL RECORD NO.:  [id number]  LOCATION:  [id number]                        FACILITY:  [facility]  PHYSICIAN:  Christina Morrow, MD        DATE OF BIRTH:  1946-07-03  DATE OF ADMISSION:  03/19/2017 DATE OF DISCHARGE:  04/05/2017                              DISCHARGE SUMMARY   DISCHARGE DIAGNOSES: 1. Left middle cerebral artery, right thalamus, right anterior     cerebral artery infarction. 2. Eliquis for deep vein thrombosis prophylaxis. 3. Pain management. 4. Depression. 5. Hypertension. 6. Atrial fibrillation. 7. Tobacco abuse. 8. Hyperlipidemia. 9. Chronic kidney disease stage 3. 10.Left knee osteoarthritis. 11.Sleep disturbance. 12.Hypokalemia, resolved.  HISTORY OF PRESENT ILLNESS:  This is a 71 year old right-handed female with history of hypertension, prior tobacco abuse, recent CVA 3 weeks ago while in [place] with left-sided weakness, placed on aspirin and Plavix therapy.  She was discharged to home, but by report of family, unable ambulate.  She subsequently was brought to [name] by her daughter for further management, admitted March 14, 2017.  Per chart review, the patient is originally from [place], had been living in [place] for the past 30 years.  CT of the head showed cerebral atrophy, ventriculomegaly, and periventricular white matter disease. Suspect lacunar type left basal ganglia infarction.  MRI of the brain showed multiple subacute infarcts with multiple vascular territory suggesting embolic disease from the heart or ascending aorta.  There was involvement of the right thalamus, left parieto-occipital junction region.  CT angiogram head and neck showed no significant carotid stenosis or proximal anterior circulation disease.  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  Venous Dopplers of lower extremity  showed superficial thrombus noted in the left lesser saphenous vein and varicosities of the left calf.  Echocardiogram with ejection fraction 60%, grade 1 diastolic dysfunction.  The patient remained on aspirin and Plavix; however, telemetry showed bouts of atrial fibrillation, changed to Eliquis.  Followup per Cardiology Services.  No plan for TEE at this time.  The patient was admitted for comprehensive rehab program.  PAST MEDICAL HISTORY:  See discharge diagnoses.  SOCIAL HISTORY:  The patient had been living in [place] alone for the past 30 years, independent prior to CVA.  Functional status upon admission to rehab services was min to mod assist for functional mobility, min to mod assist for activities of daily living.  PHYSICAL EXAMINATION:  VITAL SIGNS:  Blood pressure 173/64, pulse 72, temperature 99, respirations 18. GENERAL:  This was an alert female, flat affect.  Provides her age, date of birth.  Followed simple commands. HEENT:  Pupils round and reactive to light. CARDIAC:  Rate controlled. ABDOMEN:  Soft, nontender.  Good bowel sounds. LUNGS:  Clear to auscultation without wheeze.  REHABILITATION HOSPITAL COURSE:  The patient was admitted to Inpatient Rehab Services with therapies initiated on a 3-hour daily basis, consisting of physical therapy, occupational therapy, speech therapy, and rehabilitation nursing.  The following issues were addressed during the patient's rehabilitation stay.  Pertaining to [name]'s left MCA, right thalamus, right ACA  infarct remained stable, maintained on Eliquis.  Blood pressures controlled on Coreg, hydrochlorothiazide, Cozaar, as well as verapamil with plans to follow up with primary care provider.  Cardiac rate remained controlled.  She had received followup by Cardiology Services during her hospital stay for atrial fibrillation. She would remain on Eliquis and follow up outpatient.  Zocor for hyperlipidemia.  CKD stage 3,  creatinine 1.30 and monitored.  Bouts of hypokalemia, resolved with potassium supplement.  She did receive followup per Psychiatry Services for depression, question suicidal ideations, which was ruled out with the support of Psychiatry Services as well as Neuropsychology with followup noted.  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  She was placed on Prozac.  She would continue on low-dose Remeron.  Her trazodone had been discontinued for her sleep disturbances.  She was attending full therapies.  The patient received weekly collaborative interdisciplinary team conferences to discuss estimated length of stay, family teaching, any barriers to her discharge.  Sessions focused on continued family training, energy conservation techniques.  She was ambulating 200 feet x 2, rolling walker supervision with cueing.  She transferred sit to stand without assistive device, again requiring cues.  Daughter assisted the patient with upper and lower body dressing, gathering belongings for activities of daily living and homemaking.  Speech therapy followup. [name] comprehension continued to improve.  She was able to express her simple needs, social interaction 75%-89% of the time, problem solving 50%-74%.  Full family teaching was completed.  Her suicide precautions had since been lifted.  Plan was to be discharged to home with 24-hour supervision of family.  DISCHARGE MEDICATIONS: 1. Eliquis 5 mg p.o. b.i.d. 2. Lipitor 10 mg p.o. daily. 3. Coreg 12.5 mg p.o. b.i.d. 4. Voltaren Gel 4 times daily to affected area. 5. Prozac 20 mg p.o. daily. 6. Hydrochlorothiazide 25 mg p.o. daily. 7. Xalatan ophthalmic solution 0.005% 1 drop, both eyes daily. 8. Lidoderm patch daily. 9. Cozaar 100 mg p.o. daily. 10.Remeron 7.5 mg p.o. at bedtime. 11.Potassium chloride 20 mEq p.o. daily. 12.Verapamil 360 mg p.o. at bedtime. 13.Ultram 100 mg p.o. every 12 hours as needed  pain.  DIET:  Regular.  FOLLOWUP:  The patient would follow up with Dr. Maryla [name] at the outpatient rehab service office as advised; Dr. Roda Shutters, Neurology Services, call for appointment in 1 month; Dr. Sherryl Manges, Cardiology Service, call for appointment; Dr. Elsie Saas was to follow up for referral to outpatient medication management when the patient discharged to home.     Mariam Dollar, P.A.   ______________________________ Christina Morrow, MD    DA/MEDQ  D:  04/04/2017  T:  04/04/2017  Job:  [id number]  cc:   Duke Salvia, MD, [facility] Conni Slipper, MD Dr. Earle Gell, MD

## 2017-04-04 NOTE — Progress Notes (Addendum)
Occupational Therapy Discharge Summary  Patient Details  Name: Christina [name] MRN: [id number] Date of Birth: 27-Nov-1946  Today's Date: 04/04/2017 OT Individual Time: 0900-1003 OT Individual Time Calculation (min): 63 min   Session Note:  Pt's daughter and granddaughter in for family education.  Pt completed transfer into the shower for bathing with supervision and mod instructional cueing for safety, with use of the RW.  Mod questioning cueing needed for thoroughness as well as max instructional cueing to not try to stand up in the shower but to sit for safety.  Pt continually trying to stand up with soap on her feet, or when attempting to dry her bottom, without awareness that it would get wet once she sat back down on the bench.  Educated daughter on [place] and to have pt complete peri drying last as once she stands she can then transfer to the wheelchair or another surface for dressing tasks without having to sit back down.  Pt continues to need mod instructional cueing for hemidressing techniques and to start with the weaker left side first.  Encouraged pt's daughter to allow pt time to problem solve through tasks and to help give questioning cues to guide her and not just give her answers or just do tasks for her.  She voiced understanding of this.  Pt finished session and was left in the wheelchair with her family.  They voiced understanding to donn safety belt on pt if they leave the room.    Patient has met 10 of 14 long term goals due to improved balance, postural control, functional use of  LEFT upper and LEFT lower extremity, improved attention, improved awareness and improved coordination.  Patient to discharge at overall Supervision level.  Patient's care partner is independent to provide the necessary physical and cognitive assistance at discharge.    Reasons goals not met: Pt continues to need mod to max assist for awareness, memory, and safety.  She needs min assist for LB dressing  when donning shoe over the left foot.   Recommendation:  Patient will benefit from ongoing skilled OT services in home health setting to continue to advance functional skills in the area of BADL and Reduce care partner burden. Pt continues to demonstrate mild hemiparesis in the LLE and LUE.  She also continues to need max instructional/demonstrational cueing for proper techniques during transfers to the toilet, and tub bench using the RW.  Decreased memory and carryover of safety issues as well as decreased awareness and attention related to her deficits still exists.  Recommend 24 hour supervision for safety and continued HHOT to progress toward modified independent level goals.   Equipment: tub bench, 3;1  Reasons for discharge: treatment goals met and discharge from hospital  Patient/family agrees with progress made and goals achieved: Yes  OT Discharge Precautions/Restrictions  Precautions Precautions: Fall Precaution Comments: apraxia, left hemiparesis Restrictions Weight Bearing Restrictions: No  Pain  See Functional tool  ADL ADL ADL Comments: refer to functional navigator Vision/Perception  Vision- History Baseline Vision/History: Wears glasses Wears Glasses: Reading only Patient Visual Report: No change from baseline Vision- Assessment Vision Assessment?: Yes Eye Alignment: Within Functional Limits Ocular Range of Motion: Within Functional Limits Alignment/Gaze Preference: Within Defined Limits Tracking/Visual Pursuits: Decreased smoothness of horizontal tracking;Other (comment) (Pt with decreased tracking horizontally at times would shoot ahead of target with rapid movement.) Visual Fields: No apparent deficits (Unable to accurately test secondary to attention level  and decreased ability to follow the directions. ) Perception Comments: Pt  at times demonstrates difficulty accessing the LUE for functional use.  She will be holding something and will exhibit decreased  awareness with decreased ability to spontaneously let it go.  Praxis Praxis-Other Comments: Decreased consistency with attempts to lift the LLE or utilize the LUE during selfcare tasks.   Cognition Overall Cognitive Status: Impaired/Different from baseline Arousal/Alertness: Awake/alert Orientation Level: Oriented to person;Oriented to place;Oriented to time;Oriented to situation Attention: Focused;Sustained Focused Attention: Appears intact Sustained Attention: Impaired Sustained Attention Impairment: Functional basic;Functional complex Selective Attention: Impaired Selective Attention Impairment: Functional basic;Functional complex Memory: Impaired Memory Impairment: Storage deficit;Decreased recall of new information Decreased Short Term Memory: Functional basic Awareness: Impaired Awareness Impairment: Anticipatory impairment Problem Solving: Impaired Problem Solving Impairment: Functional basic Sequencing: Impaired Sequencing Impairment: Functional basic Behaviors: Impulsive Safety/Judgment: Impaired Comments: Pt with multiple attempts to stand in the shower, requring max instructional cueing to not stand up.  Decreased carryover from session to session with regards to hand placement for sit to stand, staying inside of the walker with transfers, and squaring up to the surface before sitting.   Sensation Sensation Light Touch: Appears Intact Stereognosis: Appears Intact Hot/Cold: Appears Intact Proprioception: Impaired Detail Proprioception Impaired Details: Impaired LUE Coordination Gross Motor Movements are Fluid and Coordinated: No Fine Motor Movements are Fluid and Coordinated: No Coordination and Movement Description: Noted motor planning issues in the LUE with functional use.  Demonstrates difficulty letting go of objects in the left hand.  She is able to use it functionally at a diminshed level for most selfcare tasks when integrated however.  Motor  Motor Motor:  Hemiplegia;Motor apraxia;Abnormal tone;Abnormal postural alignment and control Mobility  Transfers Sit to Stand: 5: Supervision;With armrests;From chair/3-in-1;With upper extremity assist Sit to Stand Details: Tactile cues for placement;Tactile cues for sequencing Stand to Sit: 5: Supervision;With armrests;With upper extremity assist;To chair/3-in-1 Stand to Sit Details (indicate cue type and reason): Tactile cues for sequencing;Tactile cues for placement  Trunk/Postural Assessment  Cervical Assessment Cervical Assessment: Within Functional Limits Thoracic Assessment Thoracic Assessment: Within Functional Limits Lumbar Assessment Lumbar Assessment: Exceptions to [facility] (lumbar flexion noted in standing)  Balance Balance Balance Assessed: Yes Static Sitting Balance Static Sitting - Balance Support: No upper extremity supported;Feet supported Static Sitting - Level of Assistance: 7: Independent Dynamic Sitting Balance Dynamic Sitting - Balance Support: No upper extremity supported;During functional activity Dynamic Sitting - Level of Assistance: 5: Stand by assistance Static Standing Balance Static Standing - Balance Support: During functional activity Static Standing - Level of Assistance: 5: Stand by assistance Dynamic Standing Balance Dynamic Standing - Balance Support: During functional activity;Right upper extremity supported;Left upper extremity supported Dynamic Standing - Level of Assistance: 5: Stand by assistance Extremity/Trunk Assessment RUE Assessment RUE Assessment: Within Functional Limits LUE Assessment LUE Assessment: Exceptions to [facility] LUE Strength LUE Overall Strength Comments: Pt with slight decreased AROM shoulder flexion noted 0- 130 degrees.  Strength at the shoulder, elbow, and grip 4/5 throughout.  She demonstrates decreased motor planning at times with decreased ability to let go of objects or surfaces at times.    See Function Navigator for Current  Functional Status.  Kalyn Hofstra OTR/L 04/04/2017, 1:27 PM

## 2017-04-04 NOTE — Discharge Summary (Signed)
Discharge summary job [id number]

## 2017-04-04 NOTE — Progress Notes (Signed)
Social Work  Discharge Note  The overall goal for the admission was met for:   Discharge location: Yes-HOME TO DAUGHTER'S HOME-FAMILY TO PROVIDE 24 HR CARE  Length of Stay: Yes-17 DAYS  Discharge activity level: Yes-SUPERVISION-MIN LEVEL OF CARE  Home/community participation: Yes  Services provided included: MD, RD, PT, OT, SLP, RN, CM, Pharmacy, Neuropsych and SW  Financial Services: Other: PENDING MEDICAID  Follow-up services arranged: Home Health: [facility]-PT,OT,SP,RN, DME: [facility]-TRANSPORT CHAIR, ROLLING WALKER, 3 IN 1, TUB BENCH and Patient/Family has no preference for HH/DME agencies  Comments (or additional information):DAUGHTER HAS BEEN HERE DAILY AND PARTICIPATED IN THERAPIES WITH PT. AWARE SHE REQUIRES 24 HR CARE AT DISCHARGE, BETWEEN SHE AND HER BROTHER THEY CAN PROVIDE THIS LEVEL OF CARE. WILL FOLLOW UP WITH PSYCHIATRY BUT DAUGHTER FEELS NOT TRUE SUICIDE ATTEMPT AND PT FEELS MUCH BETTER NOW AND AT [place] WITH HER STAYING HERE WITH HER CHILDREN. MEDICAID PENDING AND PT AND DAUGHTER TO APPLY FOR SOCIAL SECURITY AFTER DISCHARGED DUE TO ELIGIBLE FOR THIS.  Patient/Family verbalized understanding of follow-up arrangements: Yes  Individual responsible for coordination of the follow-up plan: [name]-DAUGHTER  Confirmed correct DME delivered: Elease Hashimoto 04/04/2017    Elease Hashimoto

## 2017-04-04 NOTE — Progress Notes (Addendum)
Social Work Patient ID: Christina [name], female   DOB: Nov 18, 1946, 71 y.o.   MRN: [id number]  Clarified with pt and staff discharge set for tomorrow not today. Have gotten appointment for PCP through the [facility] [place] has approved her for charity care for 60 days. Equipment to be delivered today. Complete family education and prepare for discharge tomorrow. Met with daughter when here and pt to discuss Match program and follow up services, along with PCP appointment. Daughter needs a letter to be able to be excused from work to go the the appointments with pt.

## 2017-04-05 NOTE — Progress Notes (Signed)
Pt d/c home with personal belongings taken yesterday per daughter who is taking her home today.  D/C instructions provided to pt and family by Harvel Ricks, PA-C on Friday.  Daughter verbalized understanding of instructions and medications.  2 personal belonging bags left in room, daughter contacted and will pick up Sunday, 04/06/17.

## 2017-04-05 NOTE — Plan of Care (Signed)
Problem: RH COGNITION-NURSING Goal: RH STG USES MEMORY AIDS/STRATEGIES W/ASSIST TO PROBLEM SOLVE STG Uses Memory Aids/Strategies With min Assistance to Problem Solve.   Outcome: Not Met (add Reason) Pt requires mod assist with cues to problem solve

## 2017-04-05 NOTE — Progress Notes (Signed)
Subjective/Complaints: No new complaints. Excited to go home!  ROS: pt denies nausea, vomiting, diarrhea, cough, shortness of breath or chest pain  Objective: Vital Signs: Blood pressure (!) 147/54, pulse 65, temperature 98.9 F (37.2 C), temperature source Oral, resp. rate 18, weight 65.2 kg (143 lb 13.6 oz), SpO2 98 %. No results found. Results for orders placed or performed during the hospital encounter of 03/19/17 (from the past 72 hour(s))  Basic metabolic panel     Status: Abnormal   Collection Time: 04/03/17  5:58 AM  Result Value Ref Range   Sodium 141 135 - 145 mmol/L   Potassium 4.3 3.5 - 5.1 mmol/L   Chloride 107 101 - 111 mmol/L   CO2 26 22 - 32 mmol/L   Glucose, Bld 108 (H) 65 - 99 mg/dL   BUN 29 (H) 6 - 20 mg/dL   Creatinine, Ser 1.30 (H) 0.44 - 1.00 mg/dL   Calcium 9.3 8.9 - 10.3 mg/dL   GFR calc non Af Amer 41 (L) >60 mL/min   GFR calc Af Amer 47 (L) >60 mL/min    Comment: (NOTE) The eGFR has been calculated using the CKD EPI equation. This calculation has not been validated in all clinical situations. eGFR's persistently <60 mL/min signify possible Chronic Kidney Disease.    Anion gap 8 5 - 15     Gen NAD. Vital signs reviewed. HEENT: Normocephalic, atraumatic.  Cardio: RRR. Resp: CTA B/L and unlabored GI: BS positive and NT Musc/Skel:  No TTP left knee Neuro: Alert and oriented x1 with cues.  Motor RUE 5/5 RLE 4/5 HF, 4+/5 KE, ADF/PF (unchanged) LUE 4+/5 LLE 3/5 HF, 4+/5 KE, 2/5 ADF/PF Skin:   Intact. Warm and dry  Assessment/Plan: 1. Functional deficits secondary to Left MCA which require 3+ hours per day of interdisciplinary therapy in a comprehensive inpatient rehab setting. Physiatrist is providing close team supervision and 24 hour management of active medical problems listed below. Physiatrist and rehab team continue to assess barriers to discharge/monitor patient progress toward functional and medical goals. FIM: Function -  Bathing Position: Shower Body parts bathed by patient: Right arm, Left arm, Chest, Abdomen, Front perineal area, Buttocks, Right upper leg, Left upper leg, Left lower leg, Right lower leg, Back Body parts bathed by helper: Back Bathing not applicable: Right lower leg, Left lower leg, Back Assist Level: Supervision or verbal cues  Function- Upper Body Dressing/Undressing What is the patient wearing?: Bra, Pull over shirt/dress Bra - Perfomed by patient: Thread/unthread right bra strap, Thread/unthread left bra strap, Hook/unhook bra (pull down sports bra) Pull over shirt/dress - Perfomed by patient: Thread/unthread right sleeve, Thread/unthread left sleeve, Put head through opening, Pull shirt over trunk Pull over shirt/dress - Perfomed by helper: Pull shirt over trunk Assist Level: Set up Set up : To obtain clothing/put away Function - Lower Body Dressing/Undressing What is the patient wearing?: Underwear, Pants, Socks, Shoes Position: Wheelchair/chair at sink Underwear - Performed by patient: Thread/unthread right underwear leg, Thread/unthread left underwear leg, Pull underwear up/down Underwear - Performed by helper: Thread/unthread left underwear leg Pants- Performed by patient: Thread/unthread right pants leg, Thread/unthread left pants leg, Pull pants up/down Pants- Performed by helper: Thread/unthread left pants leg Non-skid slipper socks- Performed by patient: Don/doff right sock, Don/doff left sock Non-skid slipper socks- Performed by helper: Don/doff left sock Socks - Performed by patient: Don/doff right sock, Don/doff left sock Shoes - Performed by patient: Don/doff right shoe, Fasten right, Fasten left Shoes - Performed by helper: Don/doff  left shoe Assist for footwear: Maximal assist Assist for lower body dressing:  (max A)  Function - Toileting Toileting steps completed by patient: Performs perineal hygiene Toileting steps completed by helper: Adjust clothing prior to  toileting, Adjust clothing after toileting Toileting Assistive Devices: Grab bar or rail Assist level: Touching or steadying assistance (Pt.75%)  Function - Air cabin crew transfer assistive device: Grab bar Mechanical lift: Stedy Assist level to toilet: Supervision or verbal cues Assist level from toilet: Supervision or verbal cues Assist level to bedside commode (at bedside): Touching or steadying assistance (Pt > 75%) Assist level from bedside commode (at bedside): Touching or steadying assistance (Pt > 75%)  Function - Chair/bed transfer Chair/bed transfer method: Ambulatory Chair/bed transfer assist level: Touching or steadying assistance (Pt > 75%) Chair/bed transfer assistive device: Armrests Mechanical lift: Stedy Chair/bed transfer details: Verbal cues for safe use of DME/AE, Verbal cues for technique  Function - Locomotion: Wheelchair Will patient use wheelchair at discharge?: No Type: Manual Max wheelchair distance: 25 ft Assist Level: Touching or steadying assistance (Pt > 75%) Turns around,maneuvers to table,bed, and toilet,negotiates 3% grade,maneuvers on rugs and over doorsills: No Function - Locomotion: Ambulation Assistive device: Walker-rolling Max distance: 300 ft Assist level: Supervision or verbal cues Assist level: Supervision or verbal cues Assist level: Supervision or verbal cues Walk 150 feet activity did not occur: Safety/medical concerns Assist level: Supervision or verbal cues Walk 10 feet on uneven surfaces activity did not occur: Safety/medical concerns Assist level: Supervision or verbal cues  Function - Comprehension Comprehension: Auditory Comprehension assist level: Follows basic conversation/direction with no assist  Function - Expression Expression: Verbal Expression assist level: Expresses basic 75 - 89% of the time/requires cueing 10 - 24% of the time. Needs helper to occlude trach/needs to repeat words.  Function - Social  Interaction Social Interaction assist level: Interacts appropriately with others with medication or extra time (anti-anxiety, antidepressant).  Function - Problem Solving Problem solving assist level: Solves basic 75 - 89% of the time/requires cueing 10 - 24% of the time  Function - Memory Memory assist level: Recognizes or recalls 75 - 89% of the time/requires cueing 10 - 24% of the time Patient normally able to recall (first 3 days only): Current season, That he or she is in a hospital, Staff names and faces, Location of own room  Medical Problem List and Plan: 1.  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 Goals met. D/c instructions reviewed  Will see patient for transitional care management in 1-2 weeks post-discharge 2.  DVT Prophylaxis/Anticoagulation: Eliquis 3. Pain Management: Tylenol as needed 4. Mood: Cont Remeron 7.5 mg daily at bedtime  5. Neuropsych: This patient is capable of making decisions on her own behalf. 6. Skin/Wound Care: Routine skin checks 7. Fluids/Electrolytes/Nutrition: Routine I&Os 8. Hypertension. Coreg 12.5 mg twice a day, verapamil 240 mg daily at bedtime increase to 358m , Cozaar 100 mg daily  HCTZ increased to 12.5 on 4/4, increased to 25 on 4/12  Cont to monitor, will likely need further adjustments as outpt 9. Atrial fibrillation. Plan is currently to continue on Eliquis.  10. Tobacco abuse. Counseling 11. Hyperlipidemia. Zocor, will transition to Lipitor at d/c 12. ABLA  Hb 10.5 on 4/9 13. CKD  Now with AKI  Cr 1.30 on 4/12 (improving)  Encourage fluids  IVF qHS d/ced 4/13  Will need labs at follow up 14. Left knee OA  Imaging reviewed  Voltaren gel reordered  per pt requestion, lidoderm patch ordered  Will consider steroid injection   Tramadol ordered  Improving 15. Sleep disturbance  Trazodone when necessary ordered 4/8   Improving  16. SI  Unclear if this was genuine SI.  Currently on  precautions, however, pt denies ideation at present and does not recall event.  Daughter also doubtful of genuine SI.    Unable able to move pt close to nurse station and has not been seen by Psych.  Cont precaution, especially given IVF.  Appreciate Neuropsych and Psych eval - in agreement, no imminent risk to self, suicide precautions d/ced 17. Hypokalemia  K+ 4.3 on 4/12  Supplement started 4/9  LOS (Days) 17 A FACE TO FACE EVALUATION WAS PERFORMED  Macel Yearsley T 04/05/2017, 8:25 AM

## 2017-04-08 ENCOUNTER — Ambulatory Visit: Payer: Medicare Other | Attending: Internal Medicine | Admitting: Physician Assistant

## 2017-04-08 VITALS — BP 190/70 | HR 61 | Resp 16 | Ht 62.0 in | Wt 142.2 lb

## 2017-04-08 DIAGNOSIS — I749 Embolism and thrombosis of unspecified artery: Secondary | ICD-10-CM

## 2017-04-08 DIAGNOSIS — I6340 Cerebral infarction due to embolism of unspecified cerebral artery: Secondary | ICD-10-CM | POA: Diagnosis not present

## 2017-04-08 DIAGNOSIS — E785 Hyperlipidemia, unspecified: Secondary | ICD-10-CM | POA: Insufficient documentation

## 2017-04-08 DIAGNOSIS — Z8673 Personal history of transient ischemic attack (TIA), and cerebral infarction without residual deficits: Secondary | ICD-10-CM | POA: Diagnosis not present

## 2017-04-08 DIAGNOSIS — Z79899 Other long term (current) drug therapy: Secondary | ICD-10-CM | POA: Insufficient documentation

## 2017-04-08 DIAGNOSIS — Z5189 Encounter for other specified aftercare: Secondary | ICD-10-CM | POA: Diagnosis present

## 2017-04-08 DIAGNOSIS — N179 Acute kidney failure, unspecified: Secondary | ICD-10-CM | POA: Diagnosis not present

## 2017-04-08 DIAGNOSIS — I10 Essential (primary) hypertension: Secondary | ICD-10-CM

## 2017-04-08 DIAGNOSIS — G8194 Hemiplegia, unspecified affecting left nondominant side: Secondary | ICD-10-CM | POA: Diagnosis not present

## 2017-04-08 DIAGNOSIS — Z79891 Long term (current) use of opiate analgesic: Secondary | ICD-10-CM | POA: Diagnosis not present

## 2017-04-08 DIAGNOSIS — I480 Paroxysmal atrial fibrillation: Secondary | ICD-10-CM

## 2017-04-08 MED ORDER — SIMVASTATIN 20 MG PO TABS
20.0000 mg | ORAL_TABLET | Freq: Every day | ORAL | 1 refills | Status: DC
Start: 2017-04-08 — End: 2017-05-08

## 2017-04-08 MED FILL — SIMVASTATIN 20 MG TABLET: 20 | 30 days supply | Qty: 30 | Fill #0

## 2017-04-08 NOTE — Progress Notes (Signed)
Patient ID: Christina [name], female   DOB: 29-Apr-1946, 71 y.o.   MRN: [id number]     Christina [name], is a 71 y.o. female  ONG:[id number]  GMW:[id number]  DOB - 01/18/1946  Subjective:  Chief Complaint and HPI: Christina [name] is a 71 y.o. female here today to establish care and for a follow up visitAfter being in the hospital 03/14/2017-03/19/2017 for CVA, L hemiparesis, uncontrolled htn.  She had been living in [place] and started having symptoms about 3 weeks prior to coming to the [place] and admission.  She had been experiencing L sided weakness and was placed on Plavix and aspirin in [place]    A CT of the head showed cerebral atrophy, ventriculomegaly and periventricular white matter disease. Suspect lacunar type left basal ganglia infarct. MRI of the brain showed multiple subacute infarcts in multiple vascular territories suggesting embolic disease from the heart or ascending aorta. There was involvement of the right thalamus, left parieto-occipital junction region and right frontal paramedian brain.   HOSPITAL COURSE:   Embolic Strokes: Left MCA, right thalamus, right ACA infarct,embolic pattern - likely secondary to paroxysmal A. fib  Resultant Disorientation, left hemiparesis. Disorientation resolved. Left hemiparesis improving.  MRI- Multiple subacute infarctions including Left MCA, right thalamus, right ACA suggesting embolic disease  CTA H&N - left PCA occlusion, b/l fetal PCA, BA hypoplastic, occluded left vertebral artery  2D Echo - EF 55-60%  Left lower extremity venous Doppler: No DVT noted. There is a superficial thrombosis noted in the left lesser saphenous vein and several thrombosed varicosities in the left calf.  Neurology recommended TEE and loop recorder for cardioembolic work up -this was not done as patient showed A. Fib on telemetry. Electrophysiology was consulted and anticoagulation with eliquis 5 mg twice a day was recommended  Aspirin and Plavix  discontinued after discussion with Dr. Roda Shutters  patient counseled to be compliant with herantithrombotic medications  Ongoing aggressive stroke risk factor management  Therapy recommendations: CIR    atrial fibrillation stated that she had arrhythmia long time ago, but not recently  Not sure about what type of arrhythmia. Patient was on carvedilol, verapamil PTA-unclear reason.  Neurology recommended TEE and loop recorder. Please see discussion above   Periods of intermittent but transient nonsustained narrow complex tachycardia, likely SVT >requested cardiology to see , they suspect paroxysmal a fib and recommend anticoagulation  Discussed with Dr. Roda Shutters on 3/28> okay to start eliquis , continue home dose of carvedilol and verapamil.  Hypertension  Blood pressure is running high  Permissive hypertension (OK if <220/120) but gradually normalize in 5-7 days  Long-term BP goal normotensive  Continuecarvedilol to 25 MG twice a day (was on 25 MG daily) and verapamil 240 mg at bedtime.Also restart Cozaar as blood pressure still uncontrolled. Follow renal function closely   Hyperlipidemia  Home meds: Zocor 20 mg dailyresumed in hospital  LDL 62, goal < 70  Continue statin at discharge  Acute cystitis, uncomplicated (Escherichia coli and Proteus) - Completed 3 days ceftriaxone. Urine culture results as above  Left knee/leg pain - Lower extremity venous Doppler negative. Gives history of fall at home in [place] 3-4 weeks back. Does not seemneuropathic pain. Possibly arthritic pain. X-ray of left knee without acute findings. Symptomatic treatment. Improved.  Anemia Follow CBCs periodically.   Inpatient rehabilitation 03/19/2017-04/05/2017 in which she made significant progress.    Today she presents to establish care.  She denies any new complaints or problems.  She is feeling good overall and  her appetite and bowel movements are normal.  She  is continuing to do exercises at home for strengthening.  Patient was living independently prior to all of this and is now needing to use a wheelchair. She is able to ambulate some with assistance.  ED/Hospital notes reviewed.    ROS:   Constitutional:  No f/c, No night sweats, No unexplained weight loss. EENT:  No vision changes, No blurry vision, No hearing changes. No mouth, throat, or ear problems.  Respiratory: No cough, No SOB Cardiac: No CP, no palpitations GI:  No abd pain, No N/V/D. GU: No Urinary s/sx Musculoskeletal: No joint pain Neuro: No headache, no dizziness, + L sided weakness.  Skin: No rash Endocrine:  No polydipsia. No polyuria.  Psych: Denies SI/HI  No problems updated.  ALLERGIES: No Known Allergies  PAST MEDICAL HISTORY: Past Medical History:  Diagnosis Date  . Hypertension   . Stroke [facility])     MEDICATIONS AT HOME: Prior to Admission medications   Medication Sig Start Date End Date Taking? Authorizing Provider  apixaban (ELIQUIS) 5 MG TABS tablet Take 1 tablet (5 mg total) by mouth 2 (two) times daily. 04/04/17  Yes Daniel J Angiulli, PA-C  carvedilol (COREG) 12.5 MG tablet Take 1 tablet (12.5 mg total) by mouth 2 (two) times daily with a meal. 04/04/17  Yes Daniel J Angiulli, PA-C  diclofenac sodium (VOLTAREN) 1 % GEL Apply 2 g topically 4 (four) times daily. 04/04/17  Yes Daniel J Angiulli, PA-C  FLUoxetine (PROZAC) 20 MG capsule Take 1 capsule (20 mg total) by mouth daily. 04/04/17  Yes Daniel J Angiulli, PA-C  hydrochlorothiazide (HYDRODIURIL) 25 MG tablet Take 1 tablet (25 mg total) by mouth daily. 04/04/17  Yes Daniel J Angiulli, PA-C  latanoprost (XALATAN) 0.005 % ophthalmic solution Place 1 drop into both eyes every morning. 04/04/17  Yes Daniel J Angiulli, PA-C  lidocaine (LIDODERM) 5 % Place 1 patch onto the skin daily. Remove & Discard patch within 12 hours or as directed by MD 04/04/17  Yes Mcarthur Rossetti Angiulli, PA-C  losartan (COZAAR) 100 MG tablet  Take 1 tablet (100 mg total) by mouth daily. 04/04/17  Yes Daniel J Angiulli, PA-C  mirtazapine (REMERON SOL-TAB) 15 MG disintegrating tablet Take 0.5 tablets (7.5 mg total) by mouth at bedtime. 04/04/17  Yes Daniel J Angiulli, PA-C  potassium chloride SA (K-DUR,KLOR-CON) 20 MEQ tablet Take 1 tablet (20 mEq total) by mouth daily. 04/04/17  Yes Daniel J Angiulli, PA-C  senna-docusate (SENOKOT-S) 8.6-50 MG tablet Take 1 tablet by mouth at bedtime as needed for moderate constipation. 03/19/17  Yes Richarda Overlie, MD  traMADol (ULTRAM) 50 MG tablet Take 2 tablets (100 mg total) by mouth every 12 (twelve) hours as needed for moderate pain. 04/04/17  Yes Daniel J Angiulli, PA-C  verapamil (CALAN-SR) 180 MG CR tablet Take 2 tablets (360 mg total) by mouth at bedtime. 04/04/17  Yes Daniel J Angiulli, PA-C  simvastatin (ZOCOR) 20 MG tablet Take 1 tablet (20 mg total) by mouth daily. 04/08/17   Anders Simmonds, PA-C     Objective:  EXAM:   Vitals:   04/08/17 1348  BP: (!) 197/79  Pulse: 61  Resp: 16  Weight: 142 lb 3.2 oz (64.5 kg)  Height:  (1.575 m)    General appearance : A&OX3. NAD. Non-toxic-appearing.  She is in a wheelchair.  Her daughter is with her HEENT: Atraumatic and Normocephalic.  PERRLA. EOM intact.  Neck: supple, no JVD. No cervical  lymphadenopathy. No thyromegaly Chest/Lungs:  Breathing-non-labored, Good air entry bilaterally, breath sounds normal without rales, rhonchi, or wheezing  CVS: S1 S2 regular, no murmurs, gallops, rubs  Abdomen: Bowel sounds present, Non tender and not distended with no gaurding, rigidity or rebound. Extremities: Bilateral Lower Ext shows no edema, both legs are warm to touch with = pulse throughout Neurology:  CN II-XII grossly intact, Non focal.  Facial symmetry is intact.  L UE ~80% strength and ROM compared with R.  Same on LLE. Psych:  TP linear. J/I WNL. Normal speech. Appropriate eye contact and affect.  Skin:  No Rash  Data Review Lab Results    Component Value Date   HGBA1C 5.7 (H) 03/15/2017     Assessment & Plan   1. Embolic infarction (HCC) S/P hospitalization and inpatient rehab with significant but not full recovery.  2. Essential hypertension Check BP OOO and record daily and bring to next visit.  BPs were better in rehab over the last few weeks.  Patient states that she is currently upset and anxious about today's appt and was afraid they were going to be late. Continue current regimen. - Comprehensive metabolic panel - CBC with Differential/Platelet  3. PAF (paroxysmal atrial fibrillation) (HCC) Make appt with cardiology within 2-3 weeks.  Information given on AVS-f/up Dr Graciela Husbands  4. Left hemiparesis (HCC) Improving.  Continue exercises at home.  Make appt with neurology for f/up-info on AVS-Dr Xu  5. AKI (acute kidney injury) (HCC) - Comprehensive metabolic panel  Spent >1 hour in reviewing hospital notes, with patient and coordinating of information for f/up   Patient have been counseled extensively about nutrition and exercise  Return in about 2 weeks (around 04/22/2017) for assign PCP; f/up htn and stroke.  The patient was given clear instructions to go to ER or return to medical center if symptoms don't improve, worsen or new problems develop. The patient verbalized understanding. The patient was told to call to get lab results if they haven't heard anything in the next week.     Georgian Co, PA-C [facility] [place], [place] [phone]   04/08/2017, 2:11 PM

## 2017-04-08 NOTE — Patient Instructions (Addendum)
Check blood pressure daily and record and bring to next visit.  The patient would follow up with Dr. Maryla Morrow at the outpatient rehab service office as advised; Dr. Roda Shutters, Neurology Services call for appointment in 1 month [address] [place] [address] [phone] ; Dr. Sherryl Manges, Cardiology Service call for appointment for arrythmia [address] [place], [place], [place] [address]  Phone: [phone]

## 2017-04-09 ENCOUNTER — Telehealth: Payer: Self-pay | Admitting: Internal Medicine

## 2017-04-09 ENCOUNTER — Other Ambulatory Visit: Payer: Self-pay

## 2017-04-09 LAB — COMPREHENSIVE METABOLIC PANEL
ALT: 11 IU/L (ref 0–32)
AST: 17 IU/L (ref 0–40)
Albumin/Globulin Ratio: 1.4 (ref 1.2–2.2)
Albumin: 4.1 g/dL (ref 3.5–4.8)
Alkaline Phosphatase: 66 IU/L (ref 39–117)
BUN/Creatinine Ratio: 22 (ref 12–28)
BUN: 26 mg/dL (ref 8–27)
Bilirubin Total: 0.3 mg/dL (ref 0.0–1.2)
CALCIUM: 10.1 mg/dL (ref 8.7–10.3)
CO2: 25 mmol/L (ref 18–29)
CREATININE: 1.17 mg/dL — AB (ref 0.57–1.00)
Chloride: 100 mmol/L (ref 96–106)
GFR, EST AFRICAN AMERICAN: 55 mL/min/{1.73_m2} — AB (ref >59)
GFR, EST NON AFRICAN AMERICAN: 47 mL/min/{1.73_m2} — AB (ref >59)
GLUCOSE: 90 mg/dL (ref 65–99)
Globulin, Total: 3 g/dL (ref 1.5–4.5)
POTASSIUM: 4.6 mmol/L (ref 3.5–5.2)
Sodium: 141 mmol/L (ref 134–144)
TOTAL PROTEIN: 7.1 g/dL (ref 6.0–8.5)

## 2017-04-09 LAB — CBC WITH DIFFERENTIAL/PLATELET
BASOS: 1 %
Basophils Absolute: 0 10*3/uL (ref 0.0–0.2)
EOS (ABSOLUTE): 0.3 10*3/uL (ref 0.0–0.4)
Eos: 6 %
HEMOGLOBIN: 10.5 g/dL — AB (ref 11.1–15.9)
Hematocrit: 32.8 % — ABNORMAL LOW (ref 34.0–46.6)
Immature Grans (Abs): 0 10*3/uL (ref 0.0–0.1)
Immature Granulocytes: 0 %
LYMPHS: 22 %
Lymphocytes Absolute: 1.3 10*3/uL (ref 0.7–3.1)
MCH: 27.5 pg (ref 26.6–33.0)
MCHC: 32 g/dL (ref 31.5–35.7)
MCV: 86 fL (ref 79–97)
Monocytes Absolute: 0.5 10*3/uL (ref 0.1–0.9)
Monocytes: 8 %
NEUTROS ABS: 3.8 10*3/uL (ref 1.4–7.0)
Neutrophils: 63 %
Platelets: 235 10*3/uL (ref 150–379)
RBC: 3.82 x10E6/uL (ref 3.77–5.28)
RDW: 14 % (ref 12.3–15.4)
WBC: 6 10*3/uL (ref 3.4–10.8)

## 2017-04-09 NOTE — Telephone Encounter (Signed)
Please advise patient to take BP medications as prescribed and wait 2 hours, measure BP again and call us back with readings.

## 2017-04-09 NOTE — Telephone Encounter (Signed)
Caller called in to report high bp in pt 190/80. States the reading is before meds, resting. Caller would like to know if it is advisable to continue physical therapy. Pt has only been seen by  PA Georgian Co and has no PCP on file. Please f/u with caller. Thank you.

## 2017-04-09 NOTE — Patient Outreach (Signed)
[facility]) Care Management  04/09/2017  Christina [name] 1946-08-18 [id number]   EMMI: STROKE Referral date: 04/09/17 Referral source:EMMI stroke red alert Referral reason: Feeling worse overall Day # 1  Telephone call to patient regarding EMMI stroke red alert. Unable to reach patient. Message request leaving SMS notification.  RNCM's  Call back number left as per prompted.   PLAN:  RNCM will attempt #2 telephone outreach to patient within 1 week   George Ina RN,BSN,CCM [facility] Telephonic  [phone]

## 2017-04-09 NOTE — Telephone Encounter (Signed)
Called pt , no answer , left a vm .

## 2017-04-09 NOTE — Telephone Encounter (Signed)
Amy please relay this to the patient being that she is spanish speaking and I would like to inform her of this advise as soon as possible. Thank you!

## 2017-04-10 ENCOUNTER — Other Ambulatory Visit: Payer: Self-pay

## 2017-04-10 NOTE — Patient Outreach (Signed)
[facility]) Care Management  04/10/2017  Christina [name] [birth year] [id number]   EMMI: Stroke Referral date: 04/09/17 Referral source:EMMI stroke red alert Referral reason: Feeling worse overall: YES, problems setting up rehab: YES Day # 1  Telephone call to patient regarding EMMI stroke red alert. HIPAA verified with patient. Patient requested RNCM to speak with her daughter, Overton Mam regarding all of her personal health information. RNCM discussed EMMI stroke program.  Patient and daughter on phone together. Patient states she is not feeling bad and is not having problems with her rehab. Daughter states patients home health rehab started on yesterday with [facility]. Daughter states patient has her medications currently but will not be able to afford medications going forward. Daughter states patient has applied for Medicaid and she is waiting to find out whether she was approved or not. Daughter states [facility] said patient needed a hospital bed with rails. [facility] working on getting hospital bed ordered. Daughter inquired about having someone stay with patient while she is at work.  RNCM informed daughter that sitter services would be self pay.  Advised to follow up with social service case worker regarding patients Medicaid and /or resources.  Daughter states patient is unable to pay for sitter services.  RNCM gave contact 211 number to daughter for community resources.  Daughter states patient has an appointment with the disability department on 04/26/17.   Daughter confirms patient had an appointment with new primary MD, Dr. Lurlean Nanny on 04/08/17.  States patient has an appointment scheduled with Dr. Allena Katz on 04/16/17. RNCM advised daughter to call Dr. Roda Shutters, neurology and Dr. Graciela Husbands, cardiology office and set up appointments for patient as advised in discharge instructions. RNCM confirmed daughter has contact phone number for Dr. Roda Shutters and Dr. Odessa Fleming  office. Advised patient to keep follow up appointments with her doctors.  Report any new onset of symptoms to doctor.  RNCM reviewed signs/ symptoms of stroke with daughter. Advised to call 911 for stroke like symptoms.  RNCM advised patient to take her medications as prescribed and follow up with case worker regarding Medicaid approval and medication assistance resources.    Daughter denies any additional needs at this time. Daughter voiced appreciation to [facility] for advisement.   PLAN:  RNCM will refer patient to care management assistant to close due to patient being assessed and having no further needs at this time.   George Ina RN,BSN,CCM [facility] Telephonic  [phone]     PLAN:

## 2017-04-10 NOTE — Telephone Encounter (Signed)
Patient verified DOB LEVEL 1 INTERACTION- SIMVASTATIN AND VERAPAMIL- indication based on  VO for 2x a week for 4 weeks for FALL precautions. Referral for medical SW for community resources.  Patient has appointment on 04/30/17 with [facility]. Community is provided for 30 days. Patient is missing Remeron, [place].  MA spoke with Medical Director and Provider who saw the patient for HFU. Simvastatin and Verapamil were started by patients cardiologist. Patient should continue taking medications as prescribed and keep visit on 04/30/17.

## 2017-04-11 ENCOUNTER — Telehealth: Payer: Self-pay

## 2017-04-11 ENCOUNTER — Telehealth: Payer: Self-pay | Admitting: Internal Medicine

## 2017-04-11 NOTE — Telephone Encounter (Signed)
Caller equesting verbal orders for speech therapy 2x week for 4 weeks. Please f/u.

## 2017-04-11 NOTE — Telephone Encounter (Signed)
[employer] [place] Id# [id number] contacted patient to go over lab results pt didn't answer lvm asking pt to give me a call at her earliest convienence  

## 2017-04-14 ENCOUNTER — Telehealth: Payer: Self-pay

## 2017-04-14 NOTE — Telephone Encounter (Signed)
Christina [name] PT [phone]) Called for Verbal orders for 2xwk x 4wk for functional mobility and fall risk reduction, also requesting verbal orders for a ST to visit and for a hospital bed to be delivered to patients residence as well.

## 2017-04-15 NOTE — Telephone Encounter (Signed)
Tinnie Gens called from [facility] requesting verbal orders for speech therapy 2x week for 4 weeks, and occupational therapy 2x week for 3 weeks. Please f/u

## 2017-04-16 ENCOUNTER — Encounter: Payer: Self-pay | Admitting: Physical Medicine & Rehabilitation

## 2017-04-16 ENCOUNTER — Telehealth: Payer: Self-pay

## 2017-04-16 ENCOUNTER — Encounter: Payer: Medicare Other | Attending: Physical Medicine & Rehabilitation | Admitting: Physical Medicine & Rehabilitation

## 2017-04-16 VITALS — BP 165/67 | HR 65

## 2017-04-16 DIAGNOSIS — Z90710 Acquired absence of both cervix and uterus: Secondary | ICD-10-CM | POA: Insufficient documentation

## 2017-04-16 DIAGNOSIS — I69354 Hemiplegia and hemiparesis following cerebral infarction affecting left non-dominant side: Secondary | ICD-10-CM | POA: Diagnosis not present

## 2017-04-16 DIAGNOSIS — I749 Embolism and thrombosis of unspecified artery: Secondary | ICD-10-CM | POA: Insufficient documentation

## 2017-04-16 DIAGNOSIS — I4891 Unspecified atrial fibrillation: Secondary | ICD-10-CM | POA: Insufficient documentation

## 2017-04-16 DIAGNOSIS — Z8249 Family history of ischemic heart disease and other diseases of the circulatory system: Secondary | ICD-10-CM | POA: Diagnosis not present

## 2017-04-16 DIAGNOSIS — Z8659 Personal history of other mental and behavioral disorders: Secondary | ICD-10-CM

## 2017-04-16 DIAGNOSIS — M1712 Unilateral primary osteoarthritis, left knee: Secondary | ICD-10-CM

## 2017-04-16 DIAGNOSIS — I480 Paroxysmal atrial fibrillation: Secondary | ICD-10-CM

## 2017-04-16 DIAGNOSIS — I10 Essential (primary) hypertension: Secondary | ICD-10-CM | POA: Diagnosis not present

## 2017-04-16 NOTE — Progress Notes (Signed)
Subjective:    Patient ID: Christina [name], female    DOB: 1946-12-14, 71 y.o.   MRN: [id number]  HPI 71 year old right-handed female with history of hypertension, prior tobacco abuse, CVA presents for hospital follow up after receiving CIR for left MCA, right thalamus, right ACA embolic CVA.    DATE OF ADMISSION:  03/19/2017 DATE OF DISCHARGE:  04/05/2017  Daughter present, who provides majority of history. At discharge, she was instructed to follow up with Neurology, with whom she has an appointment. She does not have an appointment with Dr. Graciela Husbands.  She has not made an appointment with Psychiatry. She saw PCP, blood work was ordered.  Her BP remains elevated.  Her left knee has improved.  She denies SI and HI.  Sleep has improved. Denies falls.   Therapies: 2/week Mobility: Walker at home and community at most times. DME: Toilet seat.  Pain Inventory Average Pain 6 Pain Right Now 6 My pain is intermittent  In the last 24 hours, has pain interfered with the following? General activity 0 Relation with others 0 Enjoyment of life 0 What TIME of day is your pain at its worst? . Sleep (in general) Good  Pain is worse with: walking and bending Pain improves with: medication Relief from Meds: 6  Mobility use a walker  Function not employed: date last employed .  Neuro/Psych No problems in this area  Prior Studies Any changes since last visit?  no  Physicians involved in your care Any changes since last visit?  no   Family History  Problem Relation Age of Onset  . Heart disease Mother   . Diabetes Mother   . Heart disease Father   . Heart disease Sister   . Heart disease Sister    Social History   Social History  . Marital status: Married    Spouse name: N/A  . Number of children: N/A  . Years of education: N/A   Social History Main Topics  . Smoking status: Never Smoker  . Smokeless tobacco: Never Used  . Alcohol use No  . Drug use: No  . Sexual  activity: Not Asked   Other Topics Concern  . None   Social History Narrative  . None   Past Surgical History:  Procedure Laterality Date  . ABDOMINAL HYSTERECTOMY    . EYE SURGERY     Past Medical History:  Diagnosis Date  . Hypertension   . Stroke (HCC)    BP (!) 165/67   Pulse 65   SpO2 97%   Opioid Risk Score:   Fall Risk Score:  `1  Depression screen PHQ 2/9  Depression screen [facility] 2/9 04/16/2017 04/08/2017  Decreased Interest 0 0  Down, Depressed, Hopeless 0 0  PHQ - 2 Score 0 0  Altered sleeping 0 0  Tired, decreased energy 0 0  Change in appetite 0 0  Feeling bad or failure about yourself  0 0  Trouble concentrating 0 0  Moving slowly or fidgety/restless 0 0  Suicidal thoughts 0 0  PHQ-9 Score 0 0    Review of Systems  HENT: Negative.   Eyes: Negative.   Respiratory: Negative.   Cardiovascular: Negative.   Gastrointestinal: Negative.   Endocrine: Negative.   Genitourinary: Negative.   Musculoskeletal: Positive for arthralgias and myalgias.  Skin: Negative.   Allergic/Immunologic: Negative.   Neurological: Positive for weakness.  Hematological: Negative.   Psychiatric/Behavioral: Negative.   All other systems reviewed and are negative.  Objective:   Physical Exam Gen NAD. Vital signs reviewed. HEENT: Normocephalic, atraumatic.  Cardio: RRR. No JVD. Resp: CTA B/L and unlabored GI: BS positive and NT Musc/Skel:  No TTP left knee Neuro: Alert and oriented x2 with cues.  Motor RUE 5/5 RLE 4+/5 HF, 4+/5 KE, ADF/PF LUE 4+/5 LLE 3+/5 HF, 4+/5 KE, 3-/5 ADF/PF Skin:   Intact. Warm and dry     Assessment & Plan:  71 year old right-handed female with history of hypertension, prior tobacco abuse, CVA presents for hospital follow up after receiving CIR for left MCA, right thalamus, right ACA embolic CVA.    1. 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   Left knee OA  Cont meds  Will schedule for steroid injection  3. Hypertension  Cont meds  Elevated today  Pt needs to follow up with PCP/Cards for further adjustments.  4. History of SI  Follow up with Psychiatry  Needs to make appointment  5. Afib  Cont meds  Needs appointment with Cards   Meds reviewed Referrals reviewed - needs appointment with Cards, Psych, and PCP for BP All questions answered.

## 2017-04-16 NOTE — Patient Instructions (Signed)
Please schedule follow up with Psychiatry  Please schedule follow up with Primary care physician for blood pressure  Please schedule follow up with Cardiology for atrial fibrillation

## 2017-04-16 NOTE — Telephone Encounter (Signed)
Boneta Lucks [facility] [phone]) called requesting verbal orders for ST 2xwk X [redacted]wk along with OT 2xwk X 3wk, verbal orders given

## 2017-04-16 NOTE — Telephone Encounter (Signed)
Will forward to pcp

## 2017-04-21 NOTE — Telephone Encounter (Signed)
MA provided a VO for speech therapy and OT 2 times a week for 4 weeks (speech) and 3 weeks (OT). Medical Assistant left message on patient's home and cell voicemail. Voicemail states to give a call back to [place] with [facility] at [phone].

## 2017-04-25 MED FILL — ?CARVEDILOL 12.5 MG TABLET: 12.5 | 30 days supply | Qty: 60 | Fill #0 | Status: TO

## 2017-04-25 MED FILL — ELIQUIS 5 MG TABLET: 5 | 30 days supply | Qty: 60 | Fill #0

## 2017-04-28 MED FILL — LATANOPROST 0.005% EYE DRP: 0.005 | 120 days supply | Qty: 3 | Fill #0

## 2017-04-28 MED FILL — HYDROCHLOROTHIAZIDE 25 MG T: 25 | 30 days supply | Qty: 30 | Fill #0

## 2017-04-28 MED FILL — LOSARTAN POTASSIUM 100 MG T: 100 | 30 days supply | Qty: 30 | Fill #0

## 2017-04-28 MED FILL — VOLTAREN 1% GEL: 1 | 12 days supply | Qty: 100 | Fill #0

## 2017-04-28 MED FILL — POTASSIUM CL ER 20 MEQ TAB: 20 | 30 days supply | Qty: 30 | Fill #0

## 2017-04-28 MED FILL — FLUoxetine HCL 20 MG CAPS: 20 | 30 days supply | Qty: 30 | Fill #0

## 2017-04-28 MED FILL — MIRTAZAPINE 15 MG ODT: 15 | 30 days supply | Qty: 15 | Fill #0

## 2017-04-29 ENCOUNTER — Telehealth: Payer: Self-pay

## 2017-04-29 NOTE — Telephone Encounter (Signed)
[facility] [phone]) called today, states has high blood pressure on both arms, R-195/70  L-202/78 am bp medicine at 9am this am, call came in today at 1248pm, states patient seems to be having increased confusion as well.

## 2017-04-29 NOTE — Telephone Encounter (Signed)
She needs to call her PCP and if she cannot be seen she needs to go to Urgent Care if she is having symptomatic HTN.

## 2017-04-30 ENCOUNTER — Encounter: Payer: Medicare Other | Attending: Physical Medicine & Rehabilitation | Admitting: Physical Medicine & Rehabilitation

## 2017-04-30 ENCOUNTER — Ambulatory Visit: Payer: Self-pay | Admitting: Family Medicine

## 2017-04-30 VITALS — BP 189/77 | HR 62

## 2017-04-30 DIAGNOSIS — I10 Essential (primary) hypertension: Secondary | ICD-10-CM | POA: Insufficient documentation

## 2017-04-30 DIAGNOSIS — I4891 Unspecified atrial fibrillation: Secondary | ICD-10-CM | POA: Insufficient documentation

## 2017-04-30 DIAGNOSIS — Z8659 Personal history of other mental and behavioral disorders: Secondary | ICD-10-CM

## 2017-04-30 DIAGNOSIS — Z8249 Family history of ischemic heart disease and other diseases of the circulatory system: Secondary | ICD-10-CM | POA: Insufficient documentation

## 2017-04-30 DIAGNOSIS — I480 Paroxysmal atrial fibrillation: Secondary | ICD-10-CM

## 2017-04-30 DIAGNOSIS — I69354 Hemiplegia and hemiparesis following cerebral infarction affecting left non-dominant side: Secondary | ICD-10-CM | POA: Insufficient documentation

## 2017-04-30 DIAGNOSIS — M1712 Unilateral primary osteoarthritis, left knee: Secondary | ICD-10-CM | POA: Insufficient documentation

## 2017-04-30 DIAGNOSIS — I749 Embolism and thrombosis of unspecified artery: Secondary | ICD-10-CM | POA: Insufficient documentation

## 2017-04-30 DIAGNOSIS — Z90710 Acquired absence of both cervix and uterus: Secondary | ICD-10-CM | POA: Insufficient documentation

## 2017-04-30 NOTE — Patient Instructions (Signed)
Please schedule follow up with Dr. Roda ShuttersXu, Neurology Services [phone]- [phone]  Please schedule follow up with Dr. Sherryl MangesSteven Klein, Cardiology Service - [phone]  Please schedule follow up with Dr. Elsie SaasJonnalagadda, Psychiatry - [phone]  Please follow up with Primary care physician

## 2017-04-30 NOTE — Telephone Encounter (Signed)
Christina PeersMike [name], OT with [facility] left message stating patient has had a definite change in cognitive state. He states possible UTI.

## 2017-04-30 NOTE — Telephone Encounter (Signed)
Called Bon[facility] and relayed message from Dr. Allena KatzPatel and informed her that if she has any questions to call us back

## 2017-04-30 NOTE — Progress Notes (Addendum)
Subjective:    Patient ID: Christina [name], female    DOB: [birth year], 71 y.o.   MRN: [id number]  HPI 71 year old right-handed female with history of hypertension, prior tobacco abuse, CVA presents for follow up for left MCA, right thalamus, right ACA embolic CVA.    Last clinic visit 04/16/17.  At that time, she was scheduled for a steroid injection today, but BP remains elevated.  Daughter is present, who provides the history. She has not made any follow up appointment because she states no one gave her any phone numbers.  She is getting therapies 2/week.  Received communication from therapies noting increased BP and confusion. However, daughter states confusion has improved.   Pain Inventory Average Pain 6 Pain Right Now 6 My pain is intermittent  In the last 24 hours, has pain interfered with the following? General activity 0 Relation with others 0 Enjoyment of life 0 What TIME of day is your pain at its worst? . Sleep (in general) Good  Pain is worse with: walking and bending Pain improves with: medication Relief from Meds: 6  Mobility use a walker  Function not employed: date last employed .  Neuro/Psych No problems in this area  Prior Studies Any changes since last visit?  no  Physicians involved in your care Any changes since last visit?  no   Family History  Problem Relation Age of Onset  . Heart disease Mother   . Diabetes Mother   . Heart disease Father   . Heart disease Sister   . Heart disease Sister    Social History   Social History  . Marital status: Married    Spouse name: N/A  . Number of children: N/A  . Years of education: N/A   Social History Main Topics  . Smoking status: Never Smoker  . Smokeless tobacco: Never Used  . Alcohol use No  . Drug use: No  . Sexual activity: Not on file   Other Topics Concern  . Not on file   Social History Narrative  . No narrative on file   Past Surgical History:  Procedure Laterality Date  .  ABDOMINAL HYSTERECTOMY    . EYE SURGERY     Past Medical History:  Diagnosis Date  . Hypertension   . Stroke (HCC)    BP (!) 189/77   Pulse 62   SpO2 98%   Opioid Risk Score:   Fall Risk Score:  `1  Depression screen PHQ 2/9  Dep[facility] 2/9 04/16/2017 04/08/2017  Decreased Interest 0 0  Down, Depressed, Hopeless 0 0  PHQ - 2 Score 0 0  Altered sleeping 0 0  Tired, decreased energy 0 0  Change in appetite 0 0  Feeling bad or failure about yourself  0 0  Trouble concentrating 0 0  Moving slowly or fidgety/restless 0 0  Suicidal thoughts 0 0  PHQ-9 Score 0 0    Review of Systems  HENT: Negative.   Eyes: Negative.   Respiratory: Negative.   Cardiovascular: Negative.   Gastrointestinal: Negative.   Endocrine: Negative.   Genitourinary: Negative.   Musculoskeletal: Positive for arthralgias and myalgias.  Skin: Negative.   Allergic/Immunologic: Negative.   Neurological: Positive for weakness.  Hematological: Negative.   Psychiatric/Behavioral: Negative.   All other systems reviewed and are negative.      Objective:   Physical Exam Gen NAD. Vital signs reviewed. HEENT: Normocephalic, atraumatic.  Cardio: RRR. No JVD. Resp: CTA B/L and unlabored GI: BS positive  and NT Musc/Skel:  No TTP left knee Neuro: Alert and oriented x2 with cues.  Motor RUE 5/5 RLE 4+/5 HF, 4+/5 KE, ADF/PF LUE 4+/5 LLE 4/5 HF, 4+/5 KE, 4/5 ADF/PF Skin:   Intact. Warm and dry    Assessment & Plan:  71 year old right-handed female with history of hypertension, prior tobacco abuse, CVA presents for follow up for left MCA, right thalamus, right ACA embolic CVA.    1. 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 Educated on need to seek medical attention if BP remains elevated and/or other stroke like symptoms present  2.  Left knee  OA  Cont meds  Will schedule for steroid injection when BP better controlled  3. Hypertension  Cont meds  Remains elevated   Pt needs to follow up with PCP/Cards for further adjustments.  4. History of SI  Follow up with Psychiatry  Needs to make appointment - number provided  5. Afib  Cont meds  Needs appointment with Cards - number provided

## 2017-05-01 ENCOUNTER — Emergency Department (HOSPITAL_COMMUNITY): Payer: Medicare Other

## 2017-05-01 ENCOUNTER — Encounter (HOSPITAL_COMMUNITY): Payer: Self-pay | Admitting: Emergency Medicine

## 2017-05-01 ENCOUNTER — Inpatient Hospital Stay (HOSPITAL_COMMUNITY)
Admission: EM | Admit: 2017-05-01 | Discharge: 2017-05-03 | DRG: 078 | Disposition: A | Discharge status: Home / Self Care | Payer: Medicare Other | Attending: Internal Medicine | Admitting: Internal Medicine

## 2017-05-01 DIAGNOSIS — Z87891 Personal history of nicotine dependence: Secondary | ICD-10-CM

## 2017-05-01 DIAGNOSIS — I69354 Hemiplegia and hemiparesis following cerebral infarction affecting left non-dominant side: Secondary | ICD-10-CM

## 2017-05-01 DIAGNOSIS — Z79899 Other long term (current) drug therapy: Secondary | ICD-10-CM

## 2017-05-01 DIAGNOSIS — N189 Chronic kidney disease, unspecified: Secondary | ICD-10-CM | POA: Diagnosis present

## 2017-05-01 DIAGNOSIS — R001 Bradycardia, unspecified: Secondary | ICD-10-CM | POA: Diagnosis present

## 2017-05-01 DIAGNOSIS — B952 Enterococcus as the cause of diseases classified elsewhere: Secondary | ICD-10-CM | POA: Diagnosis present

## 2017-05-01 DIAGNOSIS — I69398 Other sequelae of cerebral infarction: Secondary | ICD-10-CM

## 2017-05-01 DIAGNOSIS — N390 Urinary tract infection, site not specified: Secondary | ICD-10-CM | POA: Diagnosis present

## 2017-05-01 DIAGNOSIS — N179 Acute kidney failure, unspecified: Secondary | ICD-10-CM | POA: Diagnosis present

## 2017-05-01 DIAGNOSIS — K5900 Constipation, unspecified: Secondary | ICD-10-CM | POA: Diagnosis present

## 2017-05-01 DIAGNOSIS — I674 Hypertensive encephalopathy: Secondary | ICD-10-CM | POA: Diagnosis not present

## 2017-05-01 DIAGNOSIS — Z7901 Long term (current) use of anticoagulants: Secondary | ICD-10-CM

## 2017-05-01 DIAGNOSIS — D649 Anemia, unspecified: Secondary | ICD-10-CM | POA: Diagnosis present

## 2017-05-01 DIAGNOSIS — I129 Hypertensive chronic kidney disease with stage 1 through stage 4 chronic kidney disease, or unspecified chronic kidney disease: Secondary | ICD-10-CM | POA: Diagnosis present

## 2017-05-01 DIAGNOSIS — I480 Paroxysmal atrial fibrillation: Secondary | ICD-10-CM | POA: Diagnosis present

## 2017-05-01 DIAGNOSIS — Z8249 Family history of ischemic heart disease and other diseases of the circulatory system: Secondary | ICD-10-CM

## 2017-05-01 DIAGNOSIS — I161 Hypertensive emergency: Secondary | ICD-10-CM | POA: Diagnosis present

## 2017-05-01 DIAGNOSIS — Z833 Family history of diabetes mellitus: Secondary | ICD-10-CM

## 2017-05-01 DIAGNOSIS — I10 Essential (primary) hypertension: Secondary | ICD-10-CM

## 2017-05-01 LAB — I-STAT TROPONIN, ED: TROPONIN I, POC: 0.01 ng/mL (ref 0.00–0.08)

## 2017-05-01 LAB — DIFFERENTIAL
BASOS ABS: 0 10*3/uL (ref 0.0–0.1)
Basophils Relative: 0 %
EOS ABS: 0.2 10*3/uL (ref 0.0–0.7)
Eosinophils Relative: 4 %
LYMPHS ABS: 0.9 10*3/uL (ref 0.7–4.0)
Lymphocytes Relative: 21 %
MONOS PCT: 5 %
Monocytes Absolute: 0.2 10*3/uL (ref 0.1–1.0)
NEUTROS ABS: 2.9 10*3/uL (ref 1.7–7.7)
NEUTROS PCT: 70 %

## 2017-05-01 LAB — COMPREHENSIVE METABOLIC PANEL
ALBUMIN: 3.9 g/dL (ref 3.5–5.0)
ALT: 11 U/L — AB (ref 14–54)
AST: 19 U/L (ref 15–41)
Alkaline Phosphatase: 59 U/L (ref 38–126)
Anion gap: 9 (ref 5–15)
BILIRUBIN TOTAL: 0.6 mg/dL (ref 0.3–1.2)
BUN: 25 mg/dL — AB (ref 6–20)
CALCIUM: 9.8 mg/dL (ref 8.9–10.3)
CHLORIDE: 105 mmol/L (ref 101–111)
CO2: 25 mmol/L (ref 22–32)
CREATININE: 1.4 mg/dL — AB (ref 0.44–1.00)
GFR calc Af Amer: 43 mL/min — ABNORMAL LOW (ref >60)
GFR calc non Af Amer: 37 mL/min — ABNORMAL LOW (ref >60)
GLUCOSE: 154 mg/dL — AB (ref 65–99)
POTASSIUM: 3.4 mmol/L — AB (ref 3.5–5.1)
Sodium: 139 mmol/L (ref 135–145)
Total Protein: 7.3 g/dL (ref 6.5–8.1)

## 2017-05-01 LAB — CBC
HEMATOCRIT: 34.7 % — AB (ref 36.0–46.0)
HEMOGLOBIN: 11.3 g/dL — AB (ref 12.0–15.0)
MCH: 27.2 pg (ref 26.0–34.0)
MCHC: 32.6 g/dL (ref 30.0–36.0)
MCV: 83.6 fL (ref 78.0–100.0)
Platelets: 203 10*3/uL (ref 150–400)
RBC: 4.15 MIL/uL (ref 3.87–5.11)
RDW: 12.7 % (ref 11.5–15.5)
WBC: 4.2 10*3/uL (ref 4.0–10.5)

## 2017-05-01 LAB — APTT: APTT: 42 s — AB (ref 24–36)

## 2017-05-01 LAB — I-STAT CHEM 8, ED
BUN: 28 mg/dL — ABNORMAL HIGH (ref 6–20)
CALCIUM ION: 1.24 mmol/L (ref 1.15–1.40)
CHLORIDE: 103 mmol/L (ref 101–111)
Creatinine, Ser: 1.5 mg/dL — ABNORMAL HIGH (ref 0.44–1.00)
GLUCOSE: 150 mg/dL — AB (ref 65–99)
HCT: 35 % — ABNORMAL LOW (ref 36.0–46.0)
Hemoglobin: 11.9 g/dL — ABNORMAL LOW (ref 12.0–15.0)
Potassium: 3.4 mmol/L — ABNORMAL LOW (ref 3.5–5.1)
Sodium: 141 mmol/L (ref 135–145)
TCO2: 25 mmol/L (ref 0–100)

## 2017-05-01 LAB — TROPONIN I

## 2017-05-01 LAB — PROTIME-INR
INR: 1.67
Prothrombin Time: 19.9 seconds — ABNORMAL HIGH (ref 11.4–15.2)

## 2017-05-01 LAB — VITAMIN B12: Vitamin B-12: 1421 pg/mL — ABNORMAL HIGH (ref 180–914)

## 2017-05-01 MED ORDER — HYDRALAZINE HCL 20 MG/ML IJ SOLN: 10.0000 mg | INTRAMUSCULAR | Status: DC | PRN

## 2017-05-01 MED ORDER — SODIUM CHLORIDE 0.9 % IV BOLUS (SEPSIS)
500.0000 mL | Freq: Once | INTRAVENOUS | Status: AC
  Administered 2017-05-01: 500 mL via INTRAVENOUS

## 2017-05-01 MED ORDER — POTASSIUM CHLORIDE CRYS ER 20 MEQ PO TBCR
20.0000 meq | EXTENDED_RELEASE_TABLET | Freq: Every day | ORAL | Status: DC
  Administered 2017-05-01 – 2017-05-03 (×3): 20 meq via ORAL
  Filled 2017-05-01 (×3): qty 1

## 2017-05-01 MED ORDER — HYDRALAZINE HCL 20 MG/ML IJ SOLN
10.0000 mg | Freq: Once | INTRAMUSCULAR | Status: AC
  Administered 2017-05-01: 10 mg via INTRAVENOUS
  Filled 2017-05-01: qty 1

## 2017-05-01 MED ORDER — CARVEDILOL 12.5 MG PO TABS
12.5000 mg | ORAL_TABLET | Freq: Two times a day (BID) | ORAL | Status: DC
  Administered 2017-05-01 – 2017-05-03 (×4): 12.5 mg via ORAL
  Filled 2017-05-01 (×4): qty 1

## 2017-05-01 MED ORDER — ATORVASTATIN CALCIUM 10 MG PO TABS
10.0000 mg | ORAL_TABLET | Freq: Every day | ORAL | Status: DC
  Administered 2017-05-02: 10 mg via ORAL
  Filled 2017-05-01: qty 1

## 2017-05-01 MED ORDER — SENNOSIDES-DOCUSATE SODIUM 8.6-50 MG PO TABS
1.0000 | ORAL_TABLET | Freq: Every evening | ORAL | Status: DC | PRN
  Administered 2017-05-02: 1 via ORAL
  Filled 2017-05-01: qty 1

## 2017-05-01 MED ORDER — APIXABAN 5 MG PO TABS: 5.0000 mg | ORAL_TABLET | Freq: Two times a day (BID) | ORAL | Status: DC

## 2017-05-01 MED ORDER — FLUOXETINE HCL 20 MG PO CAPS
20.0000 mg | ORAL_CAPSULE | Freq: Every day | ORAL | Status: DC
  Administered 2017-05-02 – 2017-05-03 (×2): 20 mg via ORAL
  Filled 2017-05-01 (×3): qty 1

## 2017-05-01 MED ORDER — VERAPAMIL HCL ER 180 MG PO TBCR
360.0000 mg | EXTENDED_RELEASE_TABLET | Freq: Every day | ORAL | Status: DC
  Administered 2017-05-01 – 2017-05-02 (×2): 360 mg via ORAL
  Filled 2017-05-01 (×2): qty 2

## 2017-05-01 MED ORDER — APIXABAN 2.5 MG PO TABS
2.5000 mg | ORAL_TABLET | Freq: Two times a day (BID) | ORAL | Status: DC
  Administered 2017-05-01 – 2017-05-03 (×4): 2.5 mg via ORAL
  Filled 2017-05-01 (×4): qty 1

## 2017-05-01 MED ORDER — SIMVASTATIN 20 MG PO TABS
20.0000 mg | ORAL_TABLET | Freq: Every day | ORAL | Status: DC
  Filled 2017-05-01: qty 1

## 2017-05-01 MED ORDER — MIRTAZAPINE 15 MG PO TBDP
7.5000 mg | ORAL_TABLET | Freq: Every day | ORAL | Status: DC
  Administered 2017-05-01: 7.5 mg via ORAL
  Filled 2017-05-01 (×2): qty 0.5

## 2017-05-01 MED ORDER — TRAMADOL HCL 50 MG PO TABS: 100.0000 mg | ORAL_TABLET | Freq: Two times a day (BID) | ORAL | Status: DC | PRN

## 2017-05-01 MED ORDER — LATANOPROST 0.005 % OP SOLN
1.0000 [drp] | OPHTHALMIC | Status: DC
  Administered 2017-05-02 – 2017-05-03 (×2): 1 [drp] via OPHTHALMIC
  Filled 2017-05-01: qty 2.5

## 2017-05-01 NOTE — H&P (Signed)
Date: 05/01/2017         Patient Name:  Christina [name] MRN: [id number]  DOB: 08-13-1946 Age / Sex: 71 y.o., female   PCP: System, Pcp Not In         Medical Service: Internal Medicine Teaching Service         Attending Physician: Dr. Rogelia Boga, Austin Miles, MD    First Contact: Dr. Carolynn Comment Pager: [phone]  Second Contact: Dr. Reggie Pile Pager: [phone]       After Hours (After 5p /  First Contact Pager: [phone]  Weekends / Holidays): Second Contact Pager: [phone]   Chief Complaint: HTN and confusion  History of Present Illness: Christina [name] is a 71 y.o. 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  Pt has reportedly been experiencing waxing and waning confusion for the last 4-5 days which is worse in the mornings and improves throughout the day. She previously had no significant cognitive deficits. She has also been noted to have SBPs >200 during this time, from a previously normal baseline. Pt's family reports that her blood pressures had been controlled until she had an argument with her sister which caused her to be very upset on Sunday. No other focal deficits without acute worsening of L extremity weakness from post-stroke baseline. Pt denies any CP, HA, visual changes, SOB, falls, new weakness, face droop or difficulty speaking. She has taken all her home medications as prescribed, administered by her daughter and grandaughter. Denies illicit substance use.  Oh note, pt did have a URI last week, which is reportedly resolved.  In the ED, pt was hypertensive and confused, but had BB pulse. She was not tachypneic and did not have supplemental O2 requirement. Afebrile, unremarkable CBC (chronic normocytic anemia), AKI to 1.5 from presumed b/l around 1.2. She received CT head which was negative for acute infarct or bleed, MRI brain with mild interval progression of ACA  infarct with some chronic watershed ischemia and no new areas of infarct or ischemia. Negative POC trop, non-specific EKG changes. Pt was given 10mg  of IV Hydral for BP control after r/o of new acute stroke. IMTS was called for admission.  Meds: No current facility-administered medications for this encounter.    Current Outpatient Prescriptions  Medication Sig Dispense Refill  . apixaban (ELIQUIS) 5 MG TABS tablet Take 1 tablet (5 mg total) by mouth 2 (two) times daily. 60 tablet 1  . carvedilol (COREG) 12.5 MG tablet Take 1 tablet (12.5 mg total) by mouth 2 (two) times daily with a meal. 60 tablet 1  . diclofenac sodium (VOLTAREN) 1 % GEL Apply 2 g topically 4 (four) times daily. 1 Tube 1  . FLUoxetine (PROZAC) 20 MG capsule Take 1 capsule (20 mg total) by mouth daily. 30 capsule 3  . hydrochlorothiazide (HYDRODIURIL) 25 MG tablet Take 1 tablet (25 mg total) by mouth daily. 30 tablet 1  . latanoprost (XALATAN) 0.005 % ophthalmic solution Place 1 drop into both eyes every morning. 2.5 mL 12  . lidocaine (LIDODERM) 5 % Place 1 patch onto the skin daily. Remove & Discard patch within 12 hours or as directed by MD 30 patch 0  . losartan (COZAAR) 100 MG tablet Take 1 tablet (100 mg total) by mouth daily. 30 tablet 1  . mirtazapine (REMERON SOL-TAB) 15 MG disintegrating tablet Take 0.5 tablets (7.5 mg total) by mouth at bedtime. 30 tablet  1  . potassium chloride SA (K-DUR,KLOR-CON) 20 MEQ tablet Take 1 tablet (20 mEq total) by mouth daily. 30 tablet 1  . senna-docusate (SENOKOT-S) 8.6-50 MG tablet Take 1 tablet by mouth at bedtime as needed for moderate constipation. 30 tablet 1  . simvastatin (ZOCOR) 20 MG tablet Take 1 tablet (20 mg total) by mouth daily. 30 tablet 1  . traMADol (ULTRAM) 50 MG tablet Take 2 tablets (100 mg total) by mouth every 12 (twelve) hours as needed for moderate pain. 20 tablet 0  . verapamil (CALAN-SR) 180 MG CR tablet Take 2 tablets (360 mg total) by mouth at bedtime. 30  tablet 1   Allergies: Allergies as of 05/01/2017  . (No Known Allergies)   Past Medical History:  Diagnosis Date  . Hypertension   . Stroke [facility])    Family History: Pt family history includes Diabetes in her mother; Heart disease in her father, mother, sister, and sister.  Social History: Pt  reports that she has quit smoking. She has never used smokeless tobacco. She reports that she does not drink alcohol or use drugs.  Review of Systems: ROS per HPI and as below. Review of Systems  Constitutional: Negative for chills, fever and weight loss.  Eyes: Negative for blurred vision.  Respiratory: Negative for cough and shortness of breath.   Cardiovascular: Negative for chest pain and leg swelling.  Gastrointestinal: Negative for abdominal pain, constipation, diarrhea, nausea and vomiting.  Genitourinary: Negative for dysuria, frequency and urgency.  Musculoskeletal: Negative for myalgias.  Skin: Negative for rash.  Neurological: Positive for focal weakness. Negative for dizziness, tremors and headaches.       Confusion Abnormal gait  Endo/Heme/Allergies: Negative for polydipsia.  Psychiatric/Behavioral: The patient is not nervous/anxious.    Physical Exam: Vitals:   05/01/17 1120 05/01/17 1245 05/01/17 1300 05/01/17 1325  BP: (!) 180/75 (!) 177/69 (!) 183/71 (!) 178/93  Pulse: 62 (!) 58 (!) 59 64  Resp: 14 11 12 16   Temp:      TempSrc:      SpO2: 100% 99% 98% 100%  Weight:      Height:       Physical Exam  Constitutional: She is oriented to person, place, and time. She appears well-developed. She is cooperative. No distress.  HENT:  Head: Normocephalic and atraumatic.  Right Ear: Hearing normal.  Left Ear: Hearing normal.  Nose: Nose normal.  Mouth/Throat: Mucous membranes are normal.  Cardiovascular: Normal rate, regular rhythm, S1 normal, S2 normal and intact distal pulses.  Exam reveals no gallop.   No murmur heard. Pulmonary/Chest: Effort normal and breath  sounds normal. No respiratory distress. She has no wheezes. She has no rhonchi. She has no rales. She exhibits no tenderness. Breasts are symmetrical.  Abdominal: Soft. Normal appearance and bowel sounds are normal. She exhibits no ascites. There is no hepatosplenomegaly. There is no tenderness. There is no CVA tenderness.  Musculoskeletal: Normal range of motion.  Neurological: She is alert and oriented to person, place, and time. She has normal strength.  4+/5 on LUE/LLE  Skin: Skin is warm, dry and intact. She is not diaphoretic.  Psychiatric: She has a normal mood and affect. Her speech is normal and behavior is normal.   Labs: ABG:  Recent Labs Lab 05/01/17 1018  TCO2 25   CBC:  Recent Labs Lab 05/01/17 1004 05/01/17 1018  WBC 4.2  --   NEUTROABS 2.9  --   HGB 11.3* 11.9*  HCT 34.7* 35.0*  MCV  83.6  --   PLT 203  --    Basic Metabolic Panel:  Recent Labs Lab 05/01/17 1004 05/01/17 1018  NA 139 141  K 3.4* 3.4*  CL 105 103  CO2 25  --   GLUCOSE 154* 150*  BUN 25* 28*  CREATININE 1.40* 1.50*  CALCIUM 9.8  --    Cardiac Enzymes:  Recent Labs Lab 05/01/17 1017  TROPIPOC 0.01   Coagulation Studies:  Recent Labs  05/01/17 1004  LABPROT 19.9*  INR 1.67   Liver Function Tests:  Recent Labs Lab 05/01/17 1004  AST 19  ALT 11*  ALKPHOS 59  BILITOT 0.6  PROT 7.3  ALBUMIN 3.9   CBG: Lab Results  Component Value Date   HGBA1C 5.7 (H) 03/15/2017    Imaging: EKG Interpretation  Date/Time:  Thursday May 01 2017 09:53:22 EDT Ventricular Rate:  69 PR Interval:  138 QRS Duration: 92 QT Interval:  442 QTC Calculation: 473 R Axis:   89 Text Interpretation:  Normal sinus rhythm T wave abnormality, consider inferior ischemia Prolonged QT Abnormal ECG No significant change since last tracing Confirmed by YAO  MD, DAVID ([id number]) on 05/01/2017 10:01:36 AM  Echocardiogram   Collection Time: 03/15/17 10:22 AM    Study Conclusions - Left ventricle: The  cavity size was normal. Wall thickness was normal. Systolic function was normal. The estimated ejection fraction was in the range of 55% to 60%. Wall motion was normal; there were no regional wall motion abnormalities. Doppler parameters are consistent with abnormal left ventricular relaxation (grade 1 diastolic dysfunction). Doppler parameters are consistent with high ventricular filling pressure. - Aortic valve: Valve area (VTI): 1.88 cm^2. Valve area (Vmax): 1.81 cm^2. Valve area (Vmean): 1.64 cm^2. - Mitral valve: Mildly calcified annulus. Normal thickness leaflets. There was mild regurgitation. - Left atrium: The atrium was severely dilated. - Atrial septum: No defect or patent foramen ovale was identified. - Technically adequate study.   Dg Chest 2 View Result Date: 05/01/2017 CLINICAL DATA:  Pt presented with AMS today, she says she has been confused lately - pt states not having any pain or breathing issues - hx of htn, stroke EXAM: CHEST  2 VIEW COMPARISON:  None. FINDINGS: Cardiac silhouette is borderline enlarged. No mediastinal or hilar masses. No evidence of adenopathy. Well-defined relatively dense nodule lies in the right upper lobe near the apex consistent with a granuloma. Lungs are otherwise clear. No pleural effusion or pneumothorax. Skeletal structures are demineralized but grossly intact. IMPRESSION: No active cardiopulmonary disease. Electronically Signed   By: Amie Portlandavid  Ormond M.D.   On: 05/01/2017 10:43   Ct Head Wo Contrast Result Date: 05/01/2017 CLINICAL DATA:  Confusion for a few days with hypertension. Prior strokes. EXAM: CT HEAD WITHOUT CONTRAST TECHNIQUE: Contiguous axial images were obtained from the base of the skull through the vertex without intravenous contrast. COMPARISON:  03/15/2017 head and neck CTA. Head CT and MRI 03/14/2017. FINDINGS: Brain: The previously demonstrated medial right frontal lobe infarct demonstrates expected interval evolution and now has a more  chronic appearance with development of encephalomalacia. There is a small focus of subtle hypodensity in the dorsal right thalamus corresponding to the lacunar infarct on MRI. Small left parieto-occipital infarcts also now have a more chronic appearance. Small chronic infarcts are again seen in the bilateral cerebellum, left cerebral white matter, and left lentiform nucleus. Additional confluent periventricular white matter hypoattenuation bilaterally is unchanged and compatible with moderate chronic small vessel ischemic disease. No definite acute infarct,  intracranial hemorrhage, mass, midline shift, or extra-axial fluid collection is seen. Prominence of the lateral and third ventricles is unchanged and may reflect cerebral atrophy or normal pressure hydrocephalus. Vascular: Calcified atherosclerosis at the skullbase. No hyperdense vessel. Skull: No fracture or focal osseous lesion. Sinuses/Orbits: Visualized paranasal sinuses and mastoid air cells are clear. Bilateral cataract extraction is noted. Other: None. IMPRESSION: 1. No evidence of acute intracranial abnormality. 2. Extensive chronic ischemic changes as above, with expected interval evolution of subacute infarcts demonstrated in 02/2017. Electronically Signed   By: Sebastian Ache M.D.   On: 05/01/2017 11:03   Mr Brain Wo Contrast Result Date: 05/01/2017 CLINICAL DATA:  71 year old female with weakness and confusion for 4 days. Hypertension. Prior strokes, most recently in March. EXAM: MRI HEAD WITHOUT CONTRAST TECHNIQUE: Multiplanar, multiecho pulse sequences of the brain and surrounding structures were obtained without intravenous contrast. COMPARISON:  Head CT without contrast 1048 hours today. CTA head and neck 03/15/2017. Brain MRI 03/14/2017. FINDINGS: Brain: Evolving encephalomalacia in the right superior frontal gyrus corresponding to distal right ACA territory. Trace hemosiderin. No associated mass effect. Mild marginal restricted diffusion at  this infarct site (series 5, image 21) which was acute on 03/14/2017. There are several foci of persistent diffusion abnormality in the left periatrial white matter and left occipital lobe, but these appear to be the residual of the March 2018 process. No associated hemorrhage or mass effect here. No brand new areas of restricted diffusion are identified. Stable cerebral volume. No midline shift, mass effect, evidence of mass lesion, ventriculomegaly, extra-axial collection or acute intracranial hemorrhage. Cervicomedullary junction and pituitary are within normal limits. In addition to the above findings scattered cerebral white matter T2 and FLAIR hyperintensity plus cystic encephalomalacia is stable. Small chronic bilateral cerebellar infarcts, more so on the left, are stable. Stable deep gray matter nuclei and brainstem. Vascular: Major intracranial vascular flow voids are stable since March. Skull and upper cervical spine: Negative. Normal bone marrow signal. Sinuses/Orbits: Stable and negative. Other: Mastoids remain clear. Visible internal auditory structures appear normal. Negative scalp soft tissues. IMPRESSION: 1. Evolved and mildly expanded posterior right ACA infarct since 03/14/2017 with areas of continued marginal ischemia. Mild hemosiderin, but no malignant hemorrhagic transformation or mass effect. 2. Expected evolution of small posterior left hemisphere infarcts seen in March. Underlying chronic cerebral white matter and cerebellar small vessel disease. 3. No brand new areas of ischemia. Electronically Signed   By: Odessa Fleming M.D.   On: 05/01/2017 12:31   Assessment & Plan by Problem: Principal Problem:   Hypertensive emergency Active Problems:   AKI (acute kidney injury) (HCC)   PAF (paroxysmal atrial fibrillation) (HCC)  Christina [name] is a 71 y.o. female with h/o HTN, PAF, s/p CVA who presents with hypertensive emergency.  1) Hypertensive emergency w/ encephalopathy: No clear evidence  of stroke or hemorrhagic conversion of old infarct on MRI. No evidence of infection/hypotension to suggest recrudescence. Pt is confused with subacute waxing and waning picture consistent with hypertensive etiology. No metabolic derangements to explain AMS. POC trop negative w/ non-specific EKG changes. Control BP by 25% first 24hrs, goal ~SBP . - admit to telemetry - neuro checks - Hydralazine 5mg  IV PRN SBP > 160 and DBP 110 - hold BP meds for BP - continue home meds: coreg 12.5mg  BID, verapamil 360mg  SR qHS - hold home meds: losartan 100mg  qD and HCTZ 25mg  qD - trend trops  2) AKI: b/l SCr 1.2, now 1.5. Will encourage oral hydration and  control BP. - BP control as above  3) PAF: Currently in NSR. Pt w/ new diagnosis of PAF on tele during last admission for embolic CVA. Started on Eliquis. Rate control with coreg and verap. - continue home Eliquis - coreg/verap control as above  4) Normocytic anemia: appears chronic, no s/s of bleeding. Will check iron studies.  DVT PPx - Eliquis  Code Status - Full  Dispo: Admit patient to Observation with expected length of stay less than 2 midnights.  Signed: Carolynn Comment, MD 05/01/2017, 1:58 PM  Pager: [phone]

## 2017-05-01 NOTE — ED Notes (Signed)
Patient transported to X-ray 

## 2017-05-01 NOTE — ED Triage Notes (Signed)
Pt c/o hypertension for a few days, pt confused since Sunday, hx of stroke. Pt knows shes at the hospital, but doesn't know her age, the year, or the month. Pt daughter said she usually knows this. Last stroke was in march, pt on eliquis.

## 2017-05-01 NOTE — H&P (Signed)
Date: 05/01/2017               Patient Name:  Christina [name] MRN: [id number]  DOB: 1946/01/21 Age / Sex: 71 y.o., female   PCP: System, Pcp Not In              Medical Service: Internal Medicine Teaching Service              Attending Physician: Dr. Rogelia BogaButcher, Austin MilesElizabeth A, MD    First Contact: Clare Charonharles Stace Peace, MS 3 Pager: [phone]  Second Contact: Dr. Marinda ElkStrelow    Third Contact Dr. Loney Lohathore         After Hours (After 5p/  First Contact Pager: [phone]  weekends / holidays): Second Contact Pager: [phone]   Chief Complaint: hypertension and confusion since Sunday   History of Present Illness:  Mrs. 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 The patient previously lived in [place] years and only [place] [facility] [place] a [place] in [place] in [place] to get access to care. She has been working with Dr. Allena KatzPatel since that time The patient is here with her daughters who are able to add to her history.  The patient was previously doing well up until Sunday when the daughter noticed that the patient had a high blood pressure (reported systolic in the 200s) and was confused about where she was, and was convinced that her daughter had lived in [place], even though she never had.  Her confusion seemed to improve as the day progressed, especially after going outside for a walk.  The daughter also noted that she was having issues walking as well and was favoring her right side.  No facial droop, slurred speech, dropping objects, or hand-wrighting issues were noted.  The patient was aware of her confusion and was briefly tearful near the end of the exam.    The patient continued to have abnormally high blood pressure and confusion in the morning for the next couple of days.  The family was prompted to seek treatment today after the  urging of her speech therapist.    The patient also endorses moderate left leg pain, and a mild but consistent headache.  She does not report any light headedness, dizziness, loss of vision, chest pain, SOB, though she did report of having an upper respiratory infection last week.  She does not report any abdominal pain, or dysuria, although she notes some urinary frequency with unsatisfactory voiding. She noted that this may have caused her to drink less than she usually does. She does report some constipation which she has had for a while and is on docusate for.     Meds: No current facility-administered medications for this encounter.    Current Outpatient Prescriptions  Medication Sig Dispense Refill  . apixaban (ELIQUIS) 5 MG TABS tablet Take 1 tablet (5 mg total) by mouth 2 (two) times daily. 60 tablet 1  . carvedilol (COREG) 12.5 MG tablet Take 1 tablet (12.5 mg total) by mouth 2 (two) times daily with a meal. 60 tablet 1  . diclofenac sodium (VOLTAREN) 1 % GEL Apply 2 g topically 4 (four) times daily. 1 Tube 1  . FLUoxetine (PROZAC) 20 MG capsule Take 1 capsule (20 mg total) by mouth daily. 30 capsule 3  .  hydrochlorothiazide (HYDRODIURIL) 25 MG tablet Take 1 tablet (25 mg total) by mouth daily. 30 tablet 1  . latanoprost (XALATAN) 0.005 % ophthalmic solution Place 1 drop into both eyes every morning. 2.5 mL 12  . lidocaine (LIDODERM) 5 % Place 1 patch onto the skin daily. Remove & Discard patch within 12 hours or as directed by MD 30 patch 0  . losartan (COZAAR) 100 MG tablet Take 1 tablet (100 mg total) by mouth daily. 30 tablet 1  . mirtazapine (REMERON SOL-TAB) 15 MG disintegrating tablet Take 0.5 tablets (7.5 mg total) by mouth at bedtime. 30 tablet 1  . potassium chloride SA (K-DUR,KLOR-CON) 20 MEQ tablet Take 1 tablet (20 mEq total) by mouth daily. 30 tablet 1  . senna-docusate (SENOKOT-S) 8.6-50 MG tablet Take 1 tablet by mouth at bedtime as needed for moderate constipation. 30  tablet 1  . simvastatin (ZOCOR) 20 MG tablet Take 1 tablet (20 mg total) by mouth daily. 30 tablet 1  . traMADol (ULTRAM) 50 MG tablet Take 2 tablets (100 mg total) by mouth every 12 (twelve) hours as needed for moderate pain. 20 tablet 0  . verapamil (CALAN-SR) 180 MG CR tablet Take 2 tablets (360 mg total) by mouth at bedtime. 30 tablet 1    Allergies: Allergies as of 05/01/2017  . (No Known Allergies)   Past Medical History:  Diagnosis Date  . Hypertension   . Stroke [facility])    Past Surgical History:  Procedure Laterality Date  . ABDOMINAL HYSTERECTOMY    . EYE SURGERY     Family History  Problem Relation Age of Onset  . Heart disease Mother   . Diabetes Mother   . Heart disease Father   . Heart disease Sister   . Heart disease Sister    Social History   Social History  . Marital status: Married    Spouse name: N/A  . Number of children: N/A  . Years of education: N/A   Occupational History  . Not on file.   Social History Main Topics  . Smoking status: Never Smoker  . Smokeless tobacco: Never Used  . Alcohol use No  . Drug use: No  . Sexual activity: Not on file   Other Topics Concern  . Not on file   Social History Narrative  . No narrative on file    Review of Systems: Pertinent items noted in HPI and remainder of comprehensive ROS otherwise negative.  Physical Exam: Blood pressure (!) 177/69, pulse (!) 58, temperature 98.3 F (36.8 C), temperature source Oral, resp. rate 11, height 5\' 2"  (1.575 m), weight 130 lb (59 kg), SpO2 99 %. General appearance: cooperative, mild distress and slowed mentation Eyes: conjunctivae/corneas clear. PERRL, EOM's intact. Fundi benign. Lungs: clear to auscultation bilaterally Heart: regular rate and rhythm, S1, S2 normal, no murmur, click, rub or gallop Abdomen: soft, non-tender; bowel sounds normal; no masses,  no organomegaly Extremities: extremities normal, atraumatic, no cyanosis or edema Neurologic: Mental  status: alertness: alert, orientation: person, but not place, was alert to month but not year.   Cranial nerves: normal Motor: Generally 4/5 on the left, 5/5 on the right  Coordination: finger to nose normal bilaterally Gait: Normal   Lab results: CMP Latest Ref Rng & Units 05/01/2017 05/01/2017 04/08/2017  Glucose 65 - 99 mg/dL 161(W) 960(A) 90  BUN 6 - 20 mg/dL 54(U) 98(J) 26  Creatinine 0.44 - 1.00 mg/dL 1.91(Y) 7.82(N) 5.62(Z)  Sodium 135 - 145 mmol/L 141 139 141  Potassium 3.5 - 5.1 mmol/L 3.4(L) 3.4(L) 4.6  Chloride 101 - 111 mmol/L 103 105 100  CO2 22 - 32 mmol/L - 25 25  Calcium 8.9 - 10.3 mg/dL - 9.8 16.1  Total Protein 6.5 - 8.1 g/dL - 7.3 7.1  Total Bilirubin 0.3 - 1.2 mg/dL - 0.6 0.3  Alkaline Phos 38 - 126 U/L - 59 66  AST 15 - 41 U/L - 19 17  ALT 14 - 54 U/L - 11(L) 11   CBC Latest Ref Rng & Units 05/01/2017 05/01/2017 04/08/2017  WBC 4.0 - 10.5 K/uL - 4.2 6.0  Hemoglobin 12.0 - 15.0 g/dL 11.9(L) 11.3(L) -  Hematocrit 36.0 - 46.0 % 35.0(L) 34.7(L) 32.8(L)  Platelets 150 - 400 K/uL - 203 235     Imaging results:  Dg Chest 2 View  Result Date: 05/01/2017 CLINICAL DATA:  Pt presented with AMS today, she says she has been confused lately - pt states not having any pain or breathing issues - hx of htn, stroke EXAM: CHEST  2 VIEW COMPARISON:  None. FINDINGS: Cardiac silhouette is borderline enlarged. No mediastinal or hilar masses. No evidence of adenopathy. Well-defined relatively dense nodule lies in the right upper lobe near the apex consistent with a granuloma. Lungs are otherwise clear. No pleural effusion or pneumothorax. Skeletal structures are demineralized but grossly intact. IMPRESSION: No active cardiopulmonary disease. Electronically Signed   By: Amie Portland M.D.   On: 05/01/2017 10:43   Ct Head Wo Contrast  Result Date: 05/01/2017 CLINICAL DATA:  Confusion for a few days with hypertension. Prior strokes. EXAM: CT HEAD WITHOUT CONTRAST TECHNIQUE: Contiguous axial  images were obtained from the base of the skull through the vertex without intravenous contrast. COMPARISON:  03/15/2017 head and neck CTA. Head CT and MRI 03/14/2017. FINDINGS: Brain: The previously demonstrated medial right frontal lobe infarct demonstrates expected interval evolution and now has a more chronic appearance with development of encephalomalacia. There is a small focus of subtle hypodensity in the dorsal right thalamus corresponding to the lacunar infarct on MRI. Small left parieto-occipital infarcts also now have a more chronic appearance. Small chronic infarcts are again seen in the bilateral cerebellum, left cerebral white matter, and left lentiform nucleus. Additional confluent periventricular white matter hypoattenuation bilaterally is unchanged and compatible with moderate chronic small vessel ischemic disease. No definite acute infarct, intracranial hemorrhage, mass, midline shift, or extra-axial fluid collection is seen. Prominence of the lateral and third ventricles is unchanged and may reflect cerebral atrophy or normal pressure hydrocephalus. Vascular: Calcified atherosclerosis at the skullbase. No hyperdense vessel. Skull: No fracture or focal osseous lesion. Sinuses/Orbits: Visualized paranasal sinuses and mastoid air cells are clear. Bilateral cataract extraction is noted. Other: None. IMPRESSION: 1. No evidence of acute intracranial abnormality. 2. Extensive chronic ischemic changes as above, with expected interval evolution of subacute infarcts demonstrated in 02/2017. Electronically Signed   By: Sebastian Ache M.D.   On: 05/01/2017 11:03   Mr Brain Wo Contrast  Result Date: 05/01/2017 CLINICAL DATA:  71 year old female with weakness and confusion for 4 days. Hypertension. Prior strokes, most recently in March. EXAM: MRI HEAD WITHOUT CONTRAST TECHNIQUE: Multiplanar, multiecho pulse sequences of the brain and surrounding structures were obtained without intravenous contrast.  COMPARISON:  Head CT without contrast 1048 hours today. CTA head and neck 03/15/2017. Brain MRI 03/14/2017. FINDINGS: Brain: Evolving encephalomalacia in the right superior frontal gyrus corresponding to distal right ACA territory. Trace hemosiderin. No associated mass effect. Mild marginal restricted diffusion at  this infarct site (series 5, image 21) which was acute on 03/14/2017. There are several foci of persistent diffusion abnormality in the left periatrial white matter and left occipital lobe, but these appear to be the residual of the March 2018 process. No associated hemorrhage or mass effect here. No brand new areas of restricted diffusion are identified. Stable cerebral volume. No midline shift, mass effect, evidence of mass lesion, ventriculomegaly, extra-axial collection or acute intracranial hemorrhage. Cervicomedullary junction and pituitary are within normal limits. In addition to the above findings scattered cerebral white matter T2 and FLAIR hyperintensity plus cystic encephalomalacia is stable. Small chronic bilateral cerebellar infarcts, more so on the left, are stable. Stable deep gray matter nuclei and brainstem. Vascular: Major intracranial vascular flow voids are stable since March. Skull and upper cervical spine: Negative. Normal bone marrow signal. Sinuses/Orbits: Stable and negative. Other: Mastoids remain clear. Visible internal auditory structures appear normal. Negative scalp soft tissues. IMPRESSION: 1. Evolved and mildly expanded posterior right ACA infarct since 03/14/2017 with areas of continued marginal ischemia. Mild hemosiderin, but no malignant hemorrhagic transformation or mass effect. 2. Expected evolution of small posterior left hemisphere infarcts seen in March. Underlying chronic cerebral white matter and cerebellar small vessel disease. 3. No brand new areas of ischemia. Electronically Signed   By: Odessa Fleming M.D.   On: 05/01/2017 12:31    Other results: EKG: Showed minor  T wave inversion which can be suggestive of ischemia, though it does not correlated with an increase in troponin's, or any symptoms..  Assessment & Plan by Problem: Principal Problem:   Hypertensive emergency  Hypertensive Emergency: The patient has a history of chronic hypertension, usually with a systolic in the 180s, that has been level since she was first seen at [facility] back in March. It is likely that this HTN is the cause of her stroke, and could be the cause of her current symptoms.  The MRI and the CT from the ED showed no malignant hemorrhage or mass effect which could explain her symptoms.  She has no signs of infection, which could cause her symptoms, and no signs of a UTI such as pain or burning on urination.  When pressed, she did mention she may be drinking less water than she should, which could result in confusion, though her serum sodium is normal and she shows no other signs of dehydration.   Plan:  -10 mg Hydralazine PRN to control systolic BP >160 -continue home meds carvedilol and verapamil  -encourage increased liquid PO intake  -order CBC with diff, and full chem panel to help rule out other causes of confusion  Paroxsymal Afib: The patient has nonspecific finding on EKG and troponin are normal, though we are trending it.  She is mildly bradycardic though this seems to be similar to her baseline Plan: -Continue home carvedilol and verapamil as above -trend troponin and assess tomorrow   Progressing CKD: The patient was admitted with a Ct of 1.4 which crept up to 1.5 later in the day.  She has had minnerly elevated Ct in the past, and a current GFR of 37 (previously 47), suggesting stage 3 CKD. This is likely due to her HTN which hopefully will resolve if she is better controlled.  Plan: -Control HTN as noted above    Anemia: Her Hct has been chronically 35 since April, and Hemoglobin has been chronically around 10 (currently 11.9).   Plan:  -Order iron studies to  assess cause of anemia  Constipation: Plan: continue home docusate for control  This is a Psychologist, occupational Note.  The care of the patient was discussed with Dr. Rogelia Boga and the assessment and plan was formulated with their assistance.  Please see their note for official documentation of the patient encounter.   Signed: Ames Coupe, Medical Student 05/01/2017, 1:34 PM

## 2017-05-01 NOTE — ED Provider Notes (Signed)
[facility]ER[facility]ENCY DEPT Provider Note   CSN: [id number] Arrival date & time: 05/01/17  [id number]     History   Chief Complaint Chief Complaint  Patient presents with  . Hypertension  . Altered Mental Status    HPI Hardin Negusrinda Drost is a 71 y.o. female with history of HTN and CVA (02/2017) who presents with several days of worsening hypertension and confusion.  Collateral provided by daughter.  For the last 4-5 day she has had waxing and waning confusion and SBPs to 200s at home.  Her confusion is worst first thing in the morning, and seems to get better within an hour or two of taking her morning meds.  Her daughter reports good compliance with her home antihypertensive regimen, and no recent changes.  She saw her PMR Dr Allena KatzPatel yesterday, and was instructed to present to the ED if she continued to be hypertensive and confused.  There were no acute changes overnight, but today she was again more confused in the morning.  She has not had facial droop, falls, new weakness, dysarthria, headaches, chest pain, or dyspnea.  Her home BPs were normotensive as recently as last week.  She had a URI last week which has resolved.  Some left thigh pain and numbness a couple of days ago which resolved.  In March she developed L hemiparesis and was found to have multiple subacute infarctions, suspected to have AF and placed on apixaban anticoagulation.  HPI  Past Medical History:  Diagnosis Date  . Hypertension   . Stroke [facility](HCC)     Patient Active Problem List   Diagnosis Date Noted  . Hypertensive urgency 05/01/2017  . History of suicidal ideation   . Hypokalemia   . Suicide ideation   . Sleep disturbance   . PAF (paroxysmal atrial fibrillation) (HCC)   . Labile blood pressure   . AKI (acute kidney injury) (HCC)   . Stage 3 chronic kidney disease   . Acute blood loss anemia   . Benign essential HTN   . Adjustment disorder with mixed anxiety and depressed mood   . 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 Left hemiparesis (HCC)   . Primary osteoarthritis of left knee   . Left knee pain   . Diplopia   . Acute cystitis without hematuria   . Stroke (HCC)   . TIA (transient ischemic attack) 03/14/2017  . History of CVA (cerebrovascular accident) 03/14/2017  . HTN (hypertension) 03/14/2017  . HLD (hyperlipidemia) 03/14/2017  . 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 ABDOMINAL HYSTERECTOMY    . EYE SURGERY      OB History    No data available       Home Medications    Prior to Admission medications   Medication Sig Start Date End Date Taking? Authorizing Provider  apixaban (ELIQUIS) 5 MG TABS tablet Take 1 tablet (5 mg total) by mouth 2 (two) times daily. 04/04/17   Angiulli, Mcarthur Rossettianiel J, PA-C  carvedilol (COREG) 12.5 MG tablet Take 1 tablet (12.5 mg total) by mouth 2 (two) times daily with a meal. 04/04/17   Angiulli, Mcarthur Rossettianiel J, PA-C  diclofenac sodium (VOLTAREN) 1 % GEL Apply 2 g topically 4 (four) times daily. 04/04/17   Angiulli, Mcarthur Rossettianiel J, PA-C  FLUoxetine (PROZAC) 20 MG capsule Take 1 capsule (20 mg total) by mouth daily. 04/04/17   Angiulli, Mcarthur Rossettianiel J, PA-C  hydrochlorothiazide (HYDRODIURIL) 25 MG tablet Take 1 tablet (25 mg total)  by mouth daily. 04/04/17   Angiulli, Mcarthur Rossetti, PA-C  latanoprost (XALATAN) 0.005 % ophthalmic solution Place 1 drop into both eyes every morning. 04/04/17   Angiulli, Mcarthur Rossetti, PA-C  lidocaine (LIDODERM) 5 % Place 1 patch onto the skin daily. Remove & Discard patch within 12 hours or as directed by MD 04/04/17   Angiulli, Mcarthur Rossetti, PA-C  losartan (COZAAR) 100 MG tablet Take 1 tablet (100 mg total) by mouth daily. 04/04/17   Angiulli, Mcarthur Rossetti, PA-C  mirtazapine (REMERON SOL-TAB) 15 MG disintegrating tablet Take 0.5 tablets (7.5 mg total) by mouth at bedtime. 04/04/17   Angiulli, Mcarthur Rossetti, PA-C  potassium chloride SA (K-DUR,KLOR-CON) 20 MEQ tablet Take 1 tablet (20 mEq total) by mouth daily. 04/04/17    Angiulli, Mcarthur Rossetti, PA-C  senna-docusate (SENOKOT-S) 8.6-50 MG tablet Take 1 tablet by mouth at bedtime as needed for moderate constipation. 03/19/17   Richarda Overlie, MD  simvastatin (ZOCOR) 20 MG tablet Take 1 tablet (20 mg total) by mouth daily. 04/08/17   Anders Simmonds, PA-C  traMADol (ULTRAM) 50 MG tablet Take 2 tablets (100 mg total) by mouth every 12 (twelve) hours as needed for moderate pain. 04/04/17   Angiulli, Mcarthur Rossetti, PA-C  verapamil (CALAN-SR) 180 MG CR tablet Take 2 tablets (360 mg total) by mouth at bedtime. 04/04/17   Angiulli, Mcarthur Rossetti, PA-C    Family History Family History  Problem Relation Age of Onset  . Heart disease Mother   . Diabetes Mother   . Heart disease Father   . Heart disease Sister   . Heart disease Sister     Social History Social History  Substance Use Topics  . Smoking status: Never Smoker  . Smokeless tobacco: Never Used  . Alcohol use No     Allergies   Patient has no known allergies.   Review of Systems Review of Systems  Constitutional: Negative for chills and fever.  HENT: Positive for rhinorrhea.   Respiratory: Positive for cough. Negative for chest tightness and shortness of breath.   Cardiovascular: Negative for chest pain and palpitations.  Genitourinary: Negative for dysuria and hematuria.  Neurological: Negative for syncope, facial asymmetry, weakness, numbness and headaches.     Physical Exam Updated Vital Signs BP (!) 177/69   Pulse (!) 58   Temp 98.3 F (36.8 C) (Oral)   Resp 11   Ht 5\' 2"  (1.575 m)   Wt 59 kg   SpO2 99%   BMI 23.78 kg/m   Physical Exam  Constitutional: She appears well-developed and well-nourished. No distress.  HENT:  Head: Normocephalic and atraumatic.  Eyes: Conjunctivae are normal. No scleral icterus.  Cardiovascular: Normal rate, regular rhythm and normal heart sounds.   Pulmonary/Chest: Effort normal and breath sounds normal.  Abdominal: Soft. She exhibits no distension. There is no  tenderness.  Musculoskeletal: She exhibits no edema or tenderness.  Neurological:  Alert, oriented to person, read the name of the hospital from sign in room Unable to provide history of stroke or status of current rehab and SLP Unable to spell WORLD backwards Immediate recall 3/3, delayed recall 1/3 Object naming intact CN intact 4/5 LUE and LLE weakness Sensation intact to light touch throughout  Skin: Skin is warm and dry.  Psychiatric: She has a normal mood and affect. Her behavior is normal.     ED Treatments / Results  Labs (all labs ordered are listed, but only abnormal results are displayed) Labs Reviewed  PROTIME-INR - Abnormal; Notable  for the following:       Result Value   Prothrombin Time 19.9 (*)    All other components within normal limits  APTT - Abnormal; Notable for the following:    aPTT 42 (*)    All other components within normal limits  CBC - Abnormal; Notable for the following:    Hemoglobin 11.3 (*)    HCT 34.7 (*)    All other components within normal limits  COMPREHENSIVE METABOLIC PANEL - Abnormal; Notable for the following:    Potassium 3.4 (*)    Glucose, Bld 154 (*)    BUN 25 (*)    Creatinine, Ser 1.40 (*)    ALT 11 (*)    GFR calc non Af Amer 37 (*)    GFR calc Af Amer 43 (*)    All other components within normal limits  I-STAT CHEM 8, ED - Abnormal; Notable for the following:    Potassium 3.4 (*)    BUN 28 (*)    Creatinine, Ser 1.50 (*)    Glucose, Bld 150 (*)    Hemoglobin 11.9 (*)    HCT 35.0 (*)    All other components within normal limits  URINE CULTURE  DIFFERENTIAL  URINALYSIS, ROUTINE W REFLEX MICROSCOPIC  I-STAT TROPOININ, ED    EKG  EKG Interpretation  Date/Time:  Thursday May 01 2017 09:53:22 EDT Ventricular Rate:  69 PR Interval:  138 QRS Duration: 92 QT Interval:  442 QTC Calculation: 473 R Axis:   89 Text Interpretation:  Normal sinus rhythm T wave abnormality, consider inferior ischemia Prolonged QT  Abnormal ECG No significant change since last tracing Confirmed by YAO  MD, DAVID ([id number]) on 05/01/2017 10:01:36 AM       Radiology Dg Chest 2 View  Result Date: 05/01/2017 CLINICAL DATA:  Pt presented with AMS today, she says she has been confused lately - pt states not having any pain or breathing issues - hx of htn, stroke EXAM: CHEST  2 VIEW COMPARISON:  None. FINDINGS: Cardiac silhouette is borderline enlarged. No mediastinal or hilar masses. No evidence of adenopathy. Well-defined relatively dense nodule lies in the right upper lobe near the apex consistent with a granuloma. Lungs are otherwise clear. No pleural effusion or pneumothorax. Skeletal structures are demineralized but grossly intact. IMPRESSION: No active cardiopulmonary disease. Electronically Signed   By: Amie Portland M.D.   On: 05/01/2017 10:43   Ct Head Wo Contrast  Result Date: 05/01/2017 CLINICAL DATA:  Confusion for a few days with hypertension. Prior strokes. EXAM: CT HEAD WITHOUT CONTRAST TECHNIQUE: Contiguous axial images were obtained from the base of the skull through the vertex without intravenous contrast. COMPARISON:  03/15/2017 head and neck CTA. Head CT and MRI 03/14/2017. FINDINGS: Brain: The previously demonstrated medial right frontal lobe infarct demonstrates expected interval evolution and now has a more chronic appearance with development of encephalomalacia. There is a small focus of subtle hypodensity in the dorsal right thalamus corresponding to the lacunar infarct on MRI. Small left parieto-occipital infarcts also now have a more chronic appearance. Small chronic infarcts are again seen in the bilateral cerebellum, left cerebral white matter, and left lentiform nucleus. Additional confluent periventricular white matter hypoattenuation bilaterally is unchanged and compatible with moderate chronic small vessel ischemic disease. No definite acute infarct, intracranial hemorrhage, mass, midline shift, or  extra-axial fluid collection is seen. Prominence of the lateral and third ventricles is unchanged and may reflect cerebral atrophy or normal pressure hydrocephalus. Vascular: Calcified atherosclerosis  at the skullbase. No hyperdense vessel. Skull: No fracture or focal osseous lesion. Sinuses/Orbits: Visualized paranasal sinuses and mastoid air cells are clear. Bilateral cataract extraction is noted. Other: None. IMPRESSION: 1. No evidence of acute intracranial abnormality. 2. Extensive chronic ischemic changes as above, with expected interval evolution of subacute infarcts demonstrated in 02/2017. Electronically Signed   By: Sebastian Ache M.D.   On: 05/01/2017 11:03   Mr Brain Wo Contrast  Result Date: 05/01/2017 CLINICAL DATA:  71 year old female with weakness and confusion for 4 days. Hypertension. Prior strokes, most recently in March. EXAM: MRI HEAD WITHOUT CONTRAST TECHNIQUE: Multiplanar, multiecho pulse sequences of the brain and surrounding structures were obtained without intravenous contrast. COMPARISON:  Head CT without contrast 1048 hours today. CTA head and neck 03/15/2017. Brain MRI 03/14/2017. FINDINGS: Brain: Evolving encephalomalacia in the right superior frontal gyrus corresponding to distal right ACA territory. Trace hemosiderin. No associated mass effect. Mild marginal restricted diffusion at this infarct site (series 5, image 21) which was acute on 03/14/2017. There are several foci of persistent diffusion abnormality in the left periatrial white matter and left occipital lobe, but these appear to be the residual of the March 2018 process. No associated hemorrhage or mass effect here. No brand new areas of restricted diffusion are identified. Stable cerebral volume. No midline shift, mass effect, evidence of mass lesion, ventriculomegaly, extra-axial collection or acute intracranial hemorrhage. Cervicomedullary junction and pituitary are within normal limits. In addition to the above findings  scattered cerebral white matter T2 and FLAIR hyperintensity plus cystic encephalomalacia is stable. Small chronic bilateral cerebellar infarcts, more so on the left, are stable. Stable deep gray matter nuclei and brainstem. Vascular: Major intracranial vascular flow voids are stable since March. Skull and upper cervical spine: Negative. Normal bone marrow signal. Sinuses/Orbits: Stable and negative. Other: Mastoids remain clear. Visible internal auditory structures appear normal. Negative scalp soft tissues. IMPRESSION: 1. Evolved and mildly expanded posterior right ACA infarct since 03/14/2017 with areas of continued marginal ischemia. Mild hemosiderin, but no malignant hemorrhagic transformation or mass effect. 2. Expected evolution of small posterior left hemisphere infarcts seen in March. Underlying chronic cerebral white matter and cerebellar small vessel disease. 3. No brand new areas of ischemia. Electronically Signed   By: Odessa Fleming M.D.   On: 05/01/2017 12:31    Procedures Procedures (including critical care time)  Medications Ordered in ED Medications  hydrALAZINE (APRESOLINE) injection 10 mg (not administered)  sodium chloride 0.9 % bolus 500 mL (0 mLs Intravenous Stopped 05/01/17 1244)     Initial Impression / Assessment and Plan / ED Course  I have reviewed the triage vital signs and the nursing notes.  Pertinent labs & imaging results that were available during my care of the patient were reviewed by me and considered in my medical decision making (see chart for details).  71 yo woman with HTN and recent strokes who presents with subacute confusion and hypertension.  Differential includes subacute stroke with adaptive hypertension, hypertensive urgency/emergency, and PRES.  No signs/symptoms of infection, afebrile, and no leukocytosis make infection triggering recrudescence of chronic stroke deficits less likely.  Will defer BP management pending brain imaging with competing goals for  stroke vs HTN encephalopathy. -CT head w/o contrast -MRI brain w/o contrast -CBC, CMP, coags -UA and CXR  Clinical Course as of May 01 1318  Thu May 01, 2017  1120 CT head without acute bleed, mass, infarction.  [MO]  1230 MRI without new acute stroke.  Discussed on  phone with neurology, recommended BP gontrol with goal of normotension.  Will given hydralazine now.  [MO]  1307 Admission discussed with IM Dr Lucendia Herrlich  [MO]    Clinical Course User Index [MO] Alm Bustard, MD     Final Clinical Impressions(s) / ED Diagnoses   Final diagnoses:  Hypertensive encephalopathy    New Prescriptions New Prescriptions   No medications on file     Alm Bustard, MD 05/01/17 1319    Charlynne Pander, MD 05/01/17 1626

## 2017-05-01 NOTE — ED Notes (Signed)
Patient transported to MRI 

## 2017-05-01 NOTE — Progress Notes (Signed)
Patient arrived to room from  ED. Safety precautions and orders reviewed with patient. TELE applied and confirmed. MDs at bedside at this time.   Sim BoastHavy, RN

## 2017-05-02 ENCOUNTER — Ambulatory Visit: Payer: Self-pay | Admitting: Family Medicine

## 2017-05-02 DIAGNOSIS — Z7901 Long term (current) use of anticoagulants: Secondary | ICD-10-CM | POA: Diagnosis not present

## 2017-05-02 DIAGNOSIS — B952 Enterococcus as the cause of diseases classified elsewhere: Secondary | ICD-10-CM | POA: Diagnosis not present

## 2017-05-02 DIAGNOSIS — I160 Hypertensive urgency: Secondary | ICD-10-CM | POA: Insufficient documentation

## 2017-05-02 DIAGNOSIS — I480 Paroxysmal atrial fibrillation: Secondary | ICD-10-CM | POA: Diagnosis not present

## 2017-05-02 DIAGNOSIS — N189 Chronic kidney disease, unspecified: Secondary | ICD-10-CM | POA: Diagnosis not present

## 2017-05-02 DIAGNOSIS — I129 Hypertensive chronic kidney disease with stage 1 through stage 4 chronic kidney disease, or unspecified chronic kidney disease: Secondary | ICD-10-CM | POA: Diagnosis not present

## 2017-05-02 DIAGNOSIS — I674 Hypertensive encephalopathy: Secondary | ICD-10-CM | POA: Diagnosis present

## 2017-05-02 DIAGNOSIS — I161 Hypertensive emergency: Secondary | ICD-10-CM | POA: Diagnosis not present

## 2017-05-02 DIAGNOSIS — G9340 Encephalopathy, unspecified: Secondary | ICD-10-CM

## 2017-05-02 DIAGNOSIS — Z79899 Other long term (current) drug therapy: Secondary | ICD-10-CM | POA: Diagnosis not present

## 2017-05-02 DIAGNOSIS — N390 Urinary tract infection, site not specified: Secondary | ICD-10-CM | POA: Diagnosis not present

## 2017-05-02 DIAGNOSIS — I69398 Other sequelae of cerebral infarction: Secondary | ICD-10-CM | POA: Diagnosis not present

## 2017-05-02 DIAGNOSIS — N179 Acute kidney failure, unspecified: Secondary | ICD-10-CM | POA: Diagnosis not present

## 2017-05-02 DIAGNOSIS — Z87891 Personal history of nicotine dependence: Secondary | ICD-10-CM | POA: Diagnosis not present

## 2017-05-02 DIAGNOSIS — K5900 Constipation, unspecified: Secondary | ICD-10-CM | POA: Diagnosis not present

## 2017-05-02 DIAGNOSIS — D649 Anemia, unspecified: Secondary | ICD-10-CM | POA: Diagnosis not present

## 2017-05-02 DIAGNOSIS — R001 Bradycardia, unspecified: Secondary | ICD-10-CM | POA: Diagnosis not present

## 2017-05-02 LAB — BASIC METABOLIC PANEL
Anion gap: 10 (ref 5–15)
BUN: 27 mg/dL — ABNORMAL HIGH (ref 6–20)
CO2: 26 mmol/L (ref 22–32)
Calcium: 9.2 mg/dL (ref 8.9–10.3)
Chloride: 104 mmol/L (ref 101–111)
Creatinine, Ser: 1.44 mg/dL — ABNORMAL HIGH (ref 0.44–1.00)
GFR calc Af Amer: 42 mL/min — ABNORMAL LOW (ref >60)
GFR calc non Af Amer: 36 mL/min — ABNORMAL LOW (ref >60)
Glucose, Bld: 91 mg/dL (ref 65–99)
POTASSIUM: 3.7 mmol/L (ref 3.5–5.1)
SODIUM: 140 mmol/L (ref 135–145)

## 2017-05-02 LAB — URINALYSIS, ROUTINE W REFLEX MICROSCOPIC
BILIRUBIN URINE: NEGATIVE
GLUCOSE, UA: NEGATIVE mg/dL
HGB URINE DIPSTICK: NEGATIVE
Ketones, ur: NEGATIVE mg/dL
Nitrite: NEGATIVE
PROTEIN: NEGATIVE mg/dL
SPECIFIC GRAVITY, URINE: 1.01 (ref 1.005–1.030)
pH: 6 (ref 5.0–8.0)

## 2017-05-02 LAB — TROPONIN I

## 2017-05-02 MED ORDER — SODIUM CHLORIDE 0.9 % IV SOLN
INTRAVENOUS | Status: AC
  Administered 2017-05-02 – 2017-05-03 (×2): via INTRAVENOUS

## 2017-05-02 NOTE — Care Management Note (Signed)
Case Management Note  Patient Details  Name: Christina [name] MRN: [id number] Date of Birth: 1946-07-28  Subjective/Objective:                    Action/Plan: [facility]. [facility] since [facility]. CM following.   Expected Discharge Date:                  Expected Discharge Plan:  Home w Home Health Services  In-House Referral:     Discharge planning Services     Post Acute Care Choice:    Choice offered to:     DME Arranged:    DME Agency:     HH Arranged:    HH Agency:     Status of Service:  In process, will continue to follow  If discussed at Long Length of Stay Meetings, dates discussed:    Additional Comments:  Kermit BaloKelli F Mariska Daffin, RN 05/02/2017, 4:51 PM

## 2017-05-02 NOTE — Progress Notes (Signed)
  Date: 05/02/2017  Patient name: Christina [name]  Medical record number: [id number]  Date of birth: 05/08/1946   I have seen and evaluated Christina [name] and discussed their care with the Residency Team. Christina [name] is a 71 year old woman who was brought to the hospital by her family for confusion. I have read Dr. Alm BustardStrelow's H&P and agree and the patient had no additional history to give today.   This morning, she was sitting in a recliner and needed assistance to get the foot rest down. She had no complaints and ate a good breakfast without any difficulty. She states that she walked with PT and felt that her left leg wanted to walk faster than she could. PTs note stated she had mild to moderate unsteadiness due to paretic left lower extremity and decreased ability to control speed. She feels that she is less confused today but we have no family at bedside to confirm this.  PMHx: Embolic stroke in March 2018 with a residual left-sided deficits, proximal atrial fibrillation Fam Hx : Diabetes in her mother. Heart disease in her parents Soc Hx : [place] after [place] to live with family   Vitals:   05/02/17 0911 05/02/17 1324  BP: (!) 145/61 (!) 144/63  Pulse: 66 (!) 57  Resp: 16 18  Temp: 97.8 F (36.6 C) 98.3 F (36.8 C)  Gen NAD HRRR no MRG Ext no edema Skin warm and dry Neuro per MS 3 exam : 4/5 L sided strength, 5/5 R.  Sensation intact B LE 5 recall 3 of 3 immediately, 2 of 3 at 5 minutes.  Cr baseline not clear, maybe 1 - 1.2, now 1.44 Trop - x3 HgB 11.3 UA no squamous, TNTC WBC  I personally viewed his CXR images and confirmed by reading with the official read. 2 view NAD  I personally viewed his EKG and confirmed by reading with the official read. Sinus, nl axis, inverted T laterally  Assessment and Plan: I have seen and evaluated the patient as outlined above. I agree with the formulated Assessment and Plan as detailed in  the residents' note, with the following changes:   1. Acute encephalopathy - thought to be 2/2 accelerated hypertension. She only got one extra dose of hydralazine and her systolic blood pressure came down to 120's. We are only giving her 2 of her home medications - carvedilol and verapamil - and are holding the HCTZ and losartan due to acute renal failure. Her blood pressure is suboptimally controlled but acceptable and should be normal once we have the remaining 2 medications. The cause of the elevated blood pressure as an outpatient is not clear at this time.  2. Acute kidney injury - the etiology is not clear at this time. She has no symptoms of a post renal acute kidney injury and did not give a history of decreased oral intake. Intrarenal is also unlikely as she has no protein on her UA. For now, we are holding her HCTZ and losartan. She is getting some IV fluids. Recheck creatinine in the morning.  3. History of embolic CVA - physical therapy and occupational therapy are going to see the patient. We are continuing her Eliquis. She currently is in a sinus rhythm.  Likely DC to home on the 12th. Her family is unable to come in today to comment on her baseline.  Burns SpainButcher, Tela Kotecki A, MD 5/11/20183:06 PM

## 2017-05-02 NOTE — Discharge Instructions (Addendum)
Prevencin del ictus (Stroke Prevention) Algunos problemas de salud y ciertas conductas favorecen el ictus. A continuacin se indican algunas formas de disminuir el riesgo de tener un ictus.  Haga alguna actividad fsica al menos durante 30 minutos todos Mary Estherlos das.  No fume. Trate de no rodearse de Educational psychologistpersonas que fuman.  No beba alcohol en exceso.  No beba ms de Progress Energydos copas al da si es hombre.  No beba ms de una copa al da si es mujer y no est embarazada.  Consuma alimentos saludables, como frutas y verduras. Si le indican una dieta especfica, sgala estrictamente.  Mantenga sus niveles de colesterol bajo control con la dieta y medicamentos. Consuma alimentos bajos en grasas saturadas, grasas trans, colesterol y ricos en fibra.  Si tiene diabetes, siga todos los planes dietticos y tome los medicamentos como se le indique.  Pregntele al mdico si necesita tratamiento para disminuir la presin arterial. Si tiene presin arterial alta (hipertensin), siga una dieta y tome los Monsanto Companymedicamentos como se lo haya indicado el mdico.  Si usted tiene entre 18 y 39 aos, debe medirse la presin arterial cada 3 a 5 aos. Si usted tiene 40 aos o ms, debe medirse la presin arterial Allied Waste Industriestodos los aos.  Mantenga un peso saludable. Consuma alimentos bajos en caloras, sal, grasas saturadas, grasas trans y colesterol.  No consuma drogas.  Evite las pldoras anticonceptivas, si corresponde. Hable con su mdico acerca de los riesgos de tomar pldoras anticonceptivas.  Hable con su mdico si usted tiene problemas de sueo (apnea del sueo).  Tome todos los medicamentos segn las indicaciones de su mdico.  Es posible que le indiquen que tome aspirina o anticoagulantes. Tome los medicamentos como le indic el mdico.  Conozca todas las instrucciones de los medicamentos.  Asegrese de tener bajo control cualquier otra enfermedad que tenga. SOLICITE AYUDA DE INMEDIATO SI:  Pierde repentinamente la  sensibilidad (siente adormecimiento) o siente debilidad en el rostro, un brazo o una pierna.  Su cara o prpado se caen hacia un lado.  Se siente sbitamente confundido.  Tiene dificultad para hablar afasia o comprender lo que las M.D.C. Holdingspersonas dicen.  Presenta dificultad para la visin de uno o ambos ojos.  Tiene dificultad repentina para caminar.  Tiene mareos.  Pierde el equilibrio o sus movimientos son torpes (falta de coordinacin).  Siente un dolor de cabeza sbito e intenso y no sabe la causa.  Siente un dolor nuevo en el pecho.  Siente un aleteo en el corazn o le falta un latido (ritmo cardaco irregular). No espere para ver si los sntomas desaparecen. Solicite ayuda de inmediato. Comunquese con el servicio de emergencias de su localidad (911 en los [place]). No conduzca por sus propios medios OfficeMax Incorporatedhasta el hospital. Esta informacin no tiene Theme park managercomo fin reemplazar el consejo del mdico. Asegrese de hacerle al mdico cualquier pregunta que tenga. Document Released: 06/09/2012 Document Revised: 12/30/2014 Document Reviewed: 06/11/2013 Elsevier Interactive Patient Education  2017 [employer]

## 2017-05-02 NOTE — Evaluation (Signed)
Occupational Therapy Evaluation Patient Details Name: Christina [name] MRN: [id number] DOB: September 18, 1946 Today's Date: 05/02/2017    History of Present Illness Mrs. 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  MRI show mild evolution of R post. ACA and Left posterior hemisphere infarcts.   Clinical Impression   This 71 y/o F presents with the above. Pt reports at baseline she completes ADLs with ModI. Pt currently requires MinA for ADLs and functional mobility. Pt demonstrates decreased safety awareness during session, requiring verbal and tactile cues to complete ADLs and functional mobility safely. Pt reports feeling her confusion has improved since admit to hospital (no family present to determine cognitive baseline). Pt will benefit from continued OT services while in acute setting and post acute OT services to increase safety and independence for completion of ADLs and functional mobility.     Follow Up Recommendations  Home health OT;Supervision/Assistance - 24 hour    Equipment Recommendations  None recommended by OT           Precautions / Restrictions Precautions Precautions: Fall Restrictions Weight Bearing Restrictions: No      Mobility Bed Mobility Overal bed mobility: Needs Assistance Bed Mobility: Supine to Sit;Sit to Supine     Supine to sit: Supervision Sit to supine: Min guard   General bed mobility comments: Pt demonstrating impulsivity during transfers, coming to sitting EOB and sit to stand prior to being asked to   Transfers Overall transfer level: Needs assistance Equipment used: None Transfers: Sit to/from Stand Sit to Stand: Min guard         General transfer comment: for safety, MinA without device during mobility within room     Balance Overall balance assessment: Needs  assistance Sitting-balance support: Feet supported;Single extremity supported Sitting balance-Leahy Scale: Fair     Standing balance support: No upper extremity supported;During functional activity Standing balance-Leahy Scale: Fair                             ADL either performed or assessed with clinical judgement   ADL Overall ADL's : Needs assistance/impaired Eating/Feeding: Set up;Sitting   Grooming: Wash/dry hands;Wash/dry face;Oral care;Min guard;Standing Grooming Details (indicate cue type and reason): Pt able to locate toothbrush, toothpaste, and soap (all located in basin on sink) to complete standing grooming ADLs Upper Body Bathing: Min guard;Sitting   Lower Body Bathing: Min guard;Sit to/from stand   Upper Body Dressing : Min guard;Sitting   Lower Body Dressing: Sit to/from stand;Cueing for safety;Minimal assistance   Toilet Transfer: Minimal assistance;Ambulation;Regular Social worker and Hygiene: Sit to/from stand;Cueing for safety;Min guard Toileting - Clothing Manipulation Details (indicate cue type and reason): Pt able to stand to pull down/up underwear with MinGuard assist, required verbal cues as Pt started to leave bathroom with underwear not completely pulled up over buttox      Functional mobility during ADLs: Minimal assistance General ADL Comments: Pt demonstrates decreased safety awareness during session with Pt leaving walker behind and completing functional mobility without AD (no significant LOB), using counter for UE support during functional activity     Vision Baseline Vision/History: No visual deficits Patient Visual Report: No change from baseline Vision Assessment?: Yes Eye Alignment: Within Functional Limits Ocular Range of Motion: Within  Functional Limits                Pertinent Vitals/Pain Pain Assessment: Faces Faces Pain Scale: No hurt     Hand Dominance Right   Extremity/Trunk  Assessment Upper Extremity Assessment Upper Extremity Assessment: Generalized weakness   Lower Extremity Assessment Lower Extremity Assessment: Overall WFL for tasks assessed;LLE deficits/detail LLE Deficits / Details: mild coordination issues more than strength probems       Communication Communication Communication: No difficulties   Cognition Arousal/Alertness: Awake/alert Behavior During Therapy: WFL for tasks assessed/performed;Restless;Impulsive Overall Cognitive Status: No family/caregiver present to determine baseline cognitive functioning Area of Impairment: Safety/judgement;Awareness;Following commands                   Current Attention Level: Selective;Alternating   Following Commands: Follows one step commands consistently Safety/Judgement: Decreased awareness of safety;Decreased awareness of deficits Awareness: Emergent Problem Solving: Slow processing;Requires verbal cues;Difficulty sequencing;Requires tactile cues     General Comments                 Home Living Family/patient expects to be discharged to:: Private residence Living Arrangements: Children Available Help at Discharge: Family;Available PRN/intermittently Type of Home: House Home Access: Level entry     Home Layout: One level     Bathroom Shower/Tub: Chief Strategy Officer: Standard Bathroom Accessibility: Yes How Accessible: Accessible via walker Home Equipment: Wheelchair - manual;Bedside commode;Shower seat      Lives With: Daughter    Prior Functioning/Environment    Gait / Transfers Assistance Needed: pt reports using the RW and ambulating to bathroom and around the house by herself.  Reports has gone running errands with daughter ADL's / Homemaking Assistance Needed: Pt reports she completes ADLs with ModI             OT Problem List: Decreased activity tolerance;Decreased strength;Decreased safety awareness;Decreased knowledge of use of DME or AE       OT Treatment/Interventions: Self-care/ADL training;Energy conservation;DME and/or AE instruction;Therapeutic activities;Therapeutic exercise;Patient/family education;Cognitive remediation/compensation    OT Goals(Current goals can be found in the care plan section) Acute Rehab OT Goals Patient Stated Goal: independent OT Goal Formulation: With patient Time For Goal Achievement: 05/16/17 Potential to Achieve Goals: Good ADL Goals Pt Will Perform Grooming: with modified independence;standing Pt Will Perform Lower Body Bathing: with supervision;sit to/from stand Pt Will Perform Lower Body Dressing: with supervision;sit to/from stand Pt Will Perform Toileting - Clothing Manipulation and hygiene: sit to/from stand;with modified independence Additional ADL Goal #1: Pt will complete ADL task with no more than 1 verbal cue for completing task safely.  OT Frequency: Min 2X/week                             AM-PAC PT "6 Clicks" Daily Activity     Outcome Measure Help from another person eating meals?: None Help from another person taking care of personal grooming?: A Little Help from another person toileting, which includes using toliet, bedpan, or urinal?: A Little Help from another person bathing (including washing, rinsing, drying)?: A Little Help from another person to put on and taking off regular upper body clothing?: A Little Help from another person to put on and taking off regular lower body clothing?: A Little 6 Click Score: 19   End of Session Equipment Utilized During Treatment: Gait belt Nurse Communication: Mobility status  Activity Tolerance: Patient tolerated treatment well Patient left: in bed;with call bell/phone  within reach;with bed alarm set  OT Visit Diagnosis: Unsteadiness on feet (R26.81);Other symptoms and signs involving cognitive function;Muscle weakness (generalized) (M62.81)                Time: [id number] OT Time Calculation (min): 20 min Charges:   OT General Charges $OT Visit: 1 Procedure OT Evaluation $OT Eval Low Complexity: 1 Procedure G-Codes: OT G-codes **NOT FOR INPATIENT CLASS** Functional Assessment Tool Used: AM-PAC 6 Clicks Daily Activity;Clinical judgement Functional Limitation: Self care Self Care Current Status (B1478(G8987): At least 20 percent but less than 40 percent impaired, limited or restricted Self Care Goal Status (G9562(G8988): At least 1 percent but less than 20 percent impaired, limited or restricted   Marcy SirenBreanna Aquan Kope, OT Pager [phone] 05/02/2017   Orlando PennerBreanna L Milad Bublitz 05/02/2017, 4:29 PM

## 2017-05-02 NOTE — Care Management Note (Signed)
Case Management Note  Patient Details  Name: Christina [name] MRN: [id number] Date of Birth: 24-Nov-1946  Subjective/Objective:   Pt admitted with hypertensive emergency. She is from home with daughter.  Pt with no insurance and PCP listed.                 Action/Plan: PT recommending [facility] services. If [place] ordered [facility] will be consulted for charity.  CM met with the patient and she is seen at [facility] (Dr Doreene Burke). Pt also gets her medications through them. CM following for further needs, physician orders.   Expected Discharge Date:                  Expected Discharge Plan:  [place]  In-House Referral:     Discharge planning Services     Post Acute Care Choice:    Choice offered to:     DME Arranged:    DME Agency:     HH Arranged:    [place] Agency:     Status of Service:  In process, will continue to follow  If discussed at Long Length of Stay Meetings, dates discussed:    Additional Comments:  Pollie Friar, RN 05/02/2017, 1:51 PM

## 2017-05-02 NOTE — Evaluation (Signed)
Physical Therapy Evaluation Patient Details Name: Christina [name] MRN: [id number] DOB: 17-Jan-1946 Today's Date: 05/02/2017   History of Present Illness  Mrs. 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  MRI show mild evolution of R post. ACA and Left posterior hemisphere infarcts.  Clinical Impression  Pt admitted with/for confusion, see MRI results above.  Pt needs min guard to min assist for safety, less for basic mobility and more for gait depending on with/without AD.Marland Kitchen.  Pt currently limited functionally due to the problems listed below.  (see problems list.)  Pt will benefit from PT to maximize function and safety to be able to get home safely with available assist .     Follow Up Recommendations Home health PT;Supervision - Intermittent    Equipment Recommendations       Recommendations for Other Services       Precautions / Restrictions Precautions Precautions: Fall      Mobility  Bed Mobility Overal bed mobility: Needs Assistance Bed Mobility: Supine to Sit     Supine to sit: Supervision     General bed mobility comments: got up to EOB when stated I was with therapy, before I asked her to do anything.  Transfers Overall transfer level: Needs assistance   Transfers: Sit to/from Stand Sit to Stand: Min guard         General transfer comment: guard only  Ambulation/Gait Ambulation/Gait assistance: Min assist Ambulation Distance (Feet): 120 Feet Assistive device: 1 person hand held assist;None Gait Pattern/deviations: Step-through pattern Gait velocity: Without AD gets too fast for control. Gait velocity interpretation: at or above normal speed for age/gender General Gait Details: mild to moderate unsteadiness due to paretic L LE and decreased ability to control speed.  Pt with less time  spent on L LE and with little clearance during advancement of left.  The appearance is of being out of control if not facilitated to hold bacK  Stairs            Wheelchair Mobility    Modified Rankin (Stroke Patients Only) Modified Rankin (Stroke Patients Only) Pre-Morbid Rankin Score: Moderate disability Modified Rankin: Moderate disability     Balance Overall balance assessment: Needs assistance   Sitting balance-Leahy Scale: Fair       Standing balance-Leahy Scale: Fair                               Pertinent Vitals/Pain Pain Assessment: Faces Faces Pain Scale: No hurt    Home Living Family/patient expects to be discharged to:: Private residence Living Arrangements: Children Available Help at Discharge: Family;Available PRN/intermittently Type of Home: House Home Access: Level entry     Home Layout: One level Home Equipment: Wheelchair - manual      Prior Function     Gait / Transfers Assistance Needed: pt reports using the RW and ambulating to bathroom and around the house by herself.  Reports has gone running errands with daughter  ADL's / Homemaking Assistance Needed: pt reports being able to stand up and take shower, dress herself and go to bathroom by herself.        Hand Dominance   Dominant Hand: Right    Extremity/Trunk Assessment   Upper Extremity Assessment Upper Extremity Assessment: Defer  to OT evaluation    Lower Extremity Assessment Lower Extremity Assessment: Overall WFL for tasks assessed;LLE deficits/detail LLE Deficits / Details: mild coordination issues more than strength probems       Communication   Communication: No difficulties  Cognition Arousal/Alertness: Awake/alert Behavior During Therapy: WFL for tasks assessed/performed Overall Cognitive Status: Within Functional Limits for tasks assessed Area of Impairment: Following commands;Safety/judgement;Awareness                   Current Attention  Level: Selective;Alternating     Safety/Judgement: Decreased awareness of safety Awareness: Emergent          General Comments General comments (skin integrity, edema, etc.): Sats on RA upper 90's    Exercises     Assessment/Plan    PT Assessment Patient needs continued PT services  PT Problem List Decreased strength;Decreased activity tolerance;Decreased balance;Decreased mobility;Decreased coordination;Decreased knowledge of use of DME       PT Treatment Interventions DME instruction;Stair training;Gait training;Functional mobility training;Therapeutic activities;Balance training;Patient/family education    PT Goals (Current goals can be found in the Care Plan section)  Acute Rehab PT Goals Patient Stated Goal: independent PT Goal Formulation: With patient/family Time For Goal Achievement: 05/09/17 Potential to Achieve Goals: Good    Frequency Min 3X/week   Barriers to discharge        Co-evaluation               AM-PAC PT "6 Clicks" Daily Activity  Outcome Measure Difficulty turning over in bed (including adjusting bedclothes, sheets and blankets)?: A Little Difficulty moving from lying on back to sitting on the side of the bed? : A Little Difficulty sitting down on and standing up from a chair with arms (e.g., wheelchair, bedside commode, etc,.)?: A Little Help needed moving to and from a bed to chair (including a wheelchair)?: A Little Help needed walking in hospital room?: A Little Help needed climbing 3-5 steps with a railing? : A Little 6 Click Score: 18    End of Session   Activity Tolerance: Patient limited by fatigue Patient left: in chair;with call bell/phone within reach;with chair alarm set Nurse Communication: Mobility status PT Visit Diagnosis: Unsteadiness on feet (R26.81);Other abnormalities of gait and mobility (R26.89)    Time: 4098-1191 PT Time Calculation (min) (ACUTE ONLY): 30 min   Charges:   PT Evaluation $PT Eval Moderate  Complexity: 1 Procedure PT Treatments $Gait Training: 8-22 mins   PT G Codes:   PT G-Codes **NOT FOR INPATIENT CLASS** Functional Assessment Tool Used: AM-PAC 6 Clicks Basic Mobility;Clinical judgement Functional Limitation: Mobility: Walking and moving around Mobility: Walking and Moving Around Current Status (Y7829): At least 20 percent but less than 40 percent impaired, limited or restricted Mobility: Walking and Moving Around Goal Status [id number]): At least 1 percent but less than 20 percent impaired, limited or restricted    05/02/2017   Bing, PT [phone]  (pager)  Eliseo Gum Juergen Hardenbrook 05/02/2017, 1:25 PM

## 2017-05-02 NOTE — Progress Notes (Signed)
Subjective: The patient is doing better this morning, showing better awareness of her situation and is siting up in her chair having lunch. The daughters were not in the room with her, so we do not know if the patient is now back to her original baseline or continues to have confusion.  The patient does not report any symptoms except for an issue with her left leg which she describes as an issue where she starts walking too fast when she is trying to walk down the hall.  She reports no headache, abdominal pain, SOB, chest pain, or constipation.  She continues to have some urinary urgency but no symptoms of UTI or incontinence.    Objective: Vital signs in last 24 hours: Vitals:   05/02/17 0130 05/02/17 0330 05/02/17 0521 05/02/17 0911  BP: (!) 136/52 (!) 149/56 (!) 123/55 (!) 145/61  Pulse: 62 65 65 66  Resp: 18 18 18 16   Temp: 99 F (37.2 C) 99 F (37.2 C) 98.4 F (36.9 C) 97.8 F (36.6 C)  TempSrc: Oral Oral Oral Axillary  SpO2: 98% 98% 97% 97%  Weight:      Height:       Weight change:   Intake/Output Summary (Last 24 hours) at 05/02/17 1255 Last data filed at 05/01/17 2115  Gross per 24 hour  Intake              363 ml  Output              100 ml  Net              263 ml   General appearance: cooperative and no distress Lungs: clear to auscultation bilaterally Heart: regular rate and rhythm, S1, S2 normal, no murmur, click, rub or gallop Abdomen: soft, non-tender; bowel sounds normal; no masses,  no organomegaly Neurologic: Mental status: alertness: alert, orientation: person, but had issues with time and place.  Though she seemed less confused about these questions compared to yesterday and was able to answer them correctly when given options.  She was also able to remember 2/3  words after 5 minutes. , . Cranial nerves: normal Sensory: normal Motor:  4+/5  on the left extremities, 5/5 on the right Coordination: heel to shin normal bilaterally Gait: Mostly normal,  although she does favor her right side a bit Lab Results:  CMP Latest Ref Rng & Units 05/02/2017 05/01/2017 05/01/2017  Glucose 65 - 99 mg/dL 91 161(W150(H) 960(A154(H)  BUN 6 - 20 mg/dL 54(U27(H) 98(J28(H) 19(J25(H)  Creatinine 0.44 - 1.00 mg/dL 4.78(G1.44(H) 9.56(O1.50(H) 1.30(Q1.40(H)  Sodium 135 - 145 mmol/L 140 141 139  Potassium 3.5 - 5.1 mmol/L 3.7 3.4(L) 3.4(L)  Chloride 101 - 111 mmol/L 104 103 105  CO2 22 - 32 mmol/L 26 - 25  Calcium 8.9 - 10.3 mg/dL 9.2 - 9.8  Total Protein 6.5 - 8.1 g/dL - - 7.3  Total Bilirubin 0.3 - 1.2 mg/dL - - 0.6  Alkaline Phos 38 - 126 U/L - - 59  AST 15 - 41 U/L - - 19  ALT 14 - 54 U/L - - 11(L)    Micro Results: Recent Results (from the past 240 hour(s))  Urine culture     Status: None (Preliminary result)   Collection Time: 05/01/17  1:48 PM  Result Value Ref Range Status   Specimen Description URINE, CLEAN CATCH  Final   Special Requests NONE  Final   Culture CULTURE REINCUBATED FOR BETTER GROWTH  Final   Report  Status PENDING  Incomplete    Medications:  Scheduled Meds: . apixaban  2.5 mg Oral BID  . atorvastatin  10 mg Oral q1800  . carvedilol  12.5 mg Oral BID WC  . FLUoxetine  20 mg Oral Daily  . latanoprost  1 drop Both Eyes BH-q7a  . mirtazapine  7.5 mg Oral QHS  . potassium chloride SA  20 mEq Oral Daily  . verapamil  360 mg Oral QHS   Continuous Infusions: PRN Meds:.hydrALAZINE, senna-docusate, traMADol Assessment/Plan: Principal Problem:   Hypertensive emergency Active Problems:   AKI (acute kidney injury) (HCC)   PAF (paroxysmal atrial fibrillation) (HCC)  Christina [name] is a 71 yo woman with a PMHx significant for HTN, HLD, CKD, paroxysmal Afib, and a recent left ACA/MCA stroke in March, who presented to the ED with a 5 day history of high blood pressure and general confusion, who likely has either had a hypertensive encephalopathy or a stroke reactivation.  Hypertensive Encephalopathy or Stroke Reactivation: The patients BP was brought down to 123 systolic  by this AM.  On exam the patient does seem less confused, although it is still not clear if this is back to her baseline.  If so, her confusion is likely associated with her hypertension.  If she is still not at baseline per her children, it may be more likely that this is a stroke reactivation, since this would better explain her complaints about her leg.   Plan:  -10 mg Hydralazine PRN to control systolic BP >160 -continue home meds carvedilol and verapamil  -encourage increased liquid PO intake  -order CBC with diff, and full chem panel to help rule out other causes of confusion  Paroxsymal Afib: The patient has nonspecific finding on EKG and troponin are normal, though we are trending it.  She is mildly bradycardic though this seems to be similar to her baseline Plan: -Continue home carvedilol and verapamil as above  Progressing CKD: The patient was admitted with a Ct of 1.4 which crept up to 1.5 later in the day.  She has had minnerly elevated Ct in the past, and a current GFR of 37 (previously 47), suggesting stage 3 CKD. This is likely due to her HTN which hopefully will resolve if she is better controlled.  Plan: -Control HTN as noted above   Constipation: Plan: continue home docusate for control  Dispo: Tomorrow   This is a Psychologist, occupational Note.  The care of the patient was discussed with Dr. Rogelia Boga and the assessment and plan formulated with their assistance.  Please see their attached note for official documentation of the daily encounter.   LOS: 0 days   Ames Coupe, Medical Student 05/02/2017, 12:55 PM

## 2017-05-02 NOTE — Progress Notes (Signed)
   Subjective:  Pt is improved in terms of her confusion and ambulation today. Patient is still slightly disoriented but had significant improvement in her mental clarity. She has no acute complaints other than some mild gait abnormalities which were thought secondary to difficulty with her left lower extremity.  Objective:  Vital signs in last 24 hours: Vitals:   05/02/17 0330 05/02/17 0521 05/02/17 0911 05/02/17 1324  BP: (!) 149/56 (!) 123/55 (!) 145/61 (!) 144/63  Pulse: 65 65 66 (!) 57  Resp: 18 18 16 18   Temp: 99 F (37.2 C) 98.4 F (36.9 C) 97.8 F (36.6 C) 98.3 F (36.8 C)  TempSrc: Oral Oral Axillary Oral  SpO2: 98% 97% 97% 100%  Weight:      Height:       Physical Exam  Constitutional: She appears well-developed. She is cooperative. No distress.  Cardiovascular: Normal rate, regular rhythm, normal heart sounds and normal pulses.  Exam reveals no gallop.   No murmur heard. Pulmonary/Chest: Effort normal and breath sounds normal. No respiratory distress. Breasts are symmetrical.  Abdominal: Soft. Bowel sounds are normal. There is no tenderness.  Musculoskeletal: She exhibits no edema.  Neurological: She is alert. She has normal strength. She is disoriented (Oriented x 2). No cranial nerve deficit or sensory deficit. Gait (slow w/ mild L lean) abnormal.  Psychiatric: Cognition and memory are impaired (2/3 on delayed recall, but successful on prompting w/ multiple choice).   Assessment/Plan:  Principal Problem:   Hypertensive emergency Active Problems:   AKI (acute kidney injury) (HCC)   PAF (paroxysmal atrial fibrillation) (HCC)  1) Hypertensive emergency w/ encephalopathy:  also on the differential is stroke reactivation given her history of stroke in March with left upper/lower extremity weakness and confusion. blood pressure normalized overnight without intervention. Mental status significantly more clear this morning compared with yesterday that still with mild  confusion and mild disorientation. Gait appears significantly improved with intact strength. Patient does still complain of left lower extremity weakness which may be consistent with stroke reactivation secondary to hypertensive emergency. - continue home meds: coreg 12.5mg  BID, verapamil 360mg  SR qHS  2) AKI: b/l SCr 1.2, improved minimally to 1.4. Will give IVF, and trend in AM. - NS 100cc/hr x 16 hrs - BMP tomorrow AM - continue to hold ACE/HCTZ  3) PAF: Currently in NSR. - continue home Eliquis - coreg/verap control as above  Dispo: Anticipated discharge in approximately 1 day(s) w/ HH PT.   Carolynn CommentStrelow, Brooklynne Pereida, MD 05/02/2017, 2:43 PM Pager: [phone]

## 2017-05-03 DIAGNOSIS — B9620 Unspecified Escherichia coli [E. coli] as the cause of diseases classified elsewhere: Secondary | ICD-10-CM

## 2017-05-03 DIAGNOSIS — I674 Hypertensive encephalopathy: Secondary | ICD-10-CM | POA: Diagnosis not present

## 2017-05-03 DIAGNOSIS — N390 Urinary tract infection, site not specified: Secondary | ICD-10-CM

## 2017-05-03 LAB — URINE CULTURE

## 2017-05-03 LAB — BASIC METABOLIC PANEL
ANION GAP: 8 (ref 5–15)
ANION GAP: 7 (ref 5–15)
BUN: 25 mg/dL — ABNORMAL HIGH (ref 6–20)
BUN: 19 mg/dL (ref 6–20)
CHLORIDE: 109 mmol/L (ref 101–111)
CO2: 24 mmol/L (ref 22–32)
CO2: 23 mmol/L (ref 22–32)
Calcium: 8.8 mg/dL — ABNORMAL LOW (ref 8.9–10.3)
Calcium: 9 mg/dL (ref 8.9–10.3)
Chloride: 108 mmol/L (ref 101–111)
Creatinine, Ser: 1.4 mg/dL — ABNORMAL HIGH (ref 0.44–1.00)
Creatinine, Ser: 1.15 mg/dL — ABNORMAL HIGH (ref 0.44–1.00)
GFR calc Af Amer: 43 mL/min — ABNORMAL LOW (ref >60)
GFR calc Af Amer: 55 mL/min — ABNORMAL LOW (ref >60)
GFR calc non Af Amer: 37 mL/min — ABNORMAL LOW (ref >60)
GFR calc non Af Amer: 47 mL/min — ABNORMAL LOW (ref >60)
Glucose, Bld: 98 mg/dL (ref 65–99)
Glucose, Bld: 96 mg/dL (ref 65–99)
Potassium: 3.3 mmol/L — ABNORMAL LOW (ref 3.5–5.1)
Potassium: 3.6 mmol/L (ref 3.5–5.1)
Sodium: 140 mmol/L (ref 135–145)
Sodium: 139 mmol/L (ref 135–145)

## 2017-05-03 MED ORDER — NITROFURANTOIN MONOHYD MACRO 100 MG PO CAPS
100.0000 mg | ORAL_CAPSULE | Freq: Two times a day (BID) | ORAL | Status: DC
  Administered 2017-05-03: 100 mg via ORAL
  Filled 2017-05-03: qty 1

## 2017-05-03 MED ORDER — SODIUM CHLORIDE 0.9 % IV SOLN: INTRAVENOUS | Status: DC

## 2017-05-03 MED ORDER — NITROFURANTOIN MONOHYD MACRO 100 MG PO CAPS: 100.0000 mg | ORAL_CAPSULE | Freq: Two times a day (BID) | ORAL | 0 refills | Status: DC

## 2017-05-03 MED ORDER — APIXABAN 5 MG PO TABS: 5.0000 mg | ORAL_TABLET | Freq: Two times a day (BID) | ORAL | Status: DC

## 2017-05-03 NOTE — Progress Notes (Signed)
[facility].

## 2017-05-03 NOTE — Progress Notes (Cosign Needed)
Subjective: The patient was doing well this morning and was sitting up and having breakfast when I walked in the room.  She still seems a bit confused, but is markedly improved from here status on admission.  She notes that she is a bit angry with her daughter for leaving her here and does show some signs of paranoia about being able to leave the hospital.  This also could be explained by a language barrier though as her english is not that good.  She continues to have some urinary urgency.  Objective: Vital signs in last 24 hours: Vitals:   05/02/17 1741 05/02/17 2130 05/03/17 0548 05/03/17 0908  BP: (!) 146/62 (!) 140/58 (!) 133/54 (!) 163/55  Pulse: 64 (!) 57 (!) 58 62  Resp: 20 18 20 18   Temp: 98.4 F (36.9 C) 98.2 F (36.8 C) 98.6 F (37 C) 98.7 F (37.1 C)  TempSrc: Oral Oral Oral Oral  SpO2: 98% 97% 98% 100%  Weight:      Height:       Weight change:   Intake/Output Summary (Last 24 hours) at 05/03/17 1100 Last data filed at 05/03/17 0300  Gross per 24 hour  Intake          1093.33 ml  Output                0 ml  Net          1093.33 ml   General appearance: cooperative and no distress Lungs: clear to auscultation bilaterally Heart: regular rate and rhythm, S1, S2 normal, no murmur, click, rub or gallop Abdomen: soft, non-tender; bowel sounds normal; no masses,  no organomegaly Neurologic: Mental status: alertness: alert, orientation: person, but had issues with time and place, though she insists this has more to do with a language barrier and the fact that she hasnt had coffee yet, . Cranial nerves: normal Sensory: normal Motor:  4+/5  on the left extremities, 5/5 on the right Coordination: heel to shin normal bilaterally Gait: Mostly normal, although she does favor her right side a bit Lab Results:  CMP Latest Ref Rng & Units 05/03/2017 05/02/2017 05/01/2017  Glucose 65 - 99 mg/dL 98 91 161(W150(H)  BUN 6 - 20 mg/dL 96(E25(H) 45(W27(H) 09(W28(H)  Creatinine 0.44 - 1.00 mg/dL  1.19(J1.40(H) 4.78(G1.44(H) 9.56(O1.50(H)  Sodium 135 - 145 mmol/L 140 140 141  Potassium 3.5 - 5.1 mmol/L 3.3(L) 3.7 3.4(L)  Chloride 101 - 111 mmol/L 108 104 103  CO2 22 - 32 mmol/L 24 26 -  Calcium 8.9 - 10.3 mg/dL 1.3(Y8.8(L) 9.2 -  Total Protein 6.5 - 8.1 g/dL - - -  Total Bilirubin 0.3 - 1.2 mg/dL - - -  Alkaline Phos 38 - 126 U/L - - -  AST 15 - 41 U/L - - -  ALT 14 - 54 U/L - - -    Micro Results: Recent Results (from the past 240 hour(s))  Urine culture     Status: Abnormal   Collection Time: 05/01/17  1:48 PM  Result Value Ref Range Status   Specimen Description URINE, CLEAN CATCH  Final   Special Requests NONE  Final   Culture 60,000 COLONIES/mL ENTEROCOCCUS FAECALIS (A)  Final   Report Status 05/03/2017 FINAL  Final   Organism ID, Bacteria ENTEROCOCCUS FAECALIS (A)  Final      Susceptibility   Enterococcus faecalis - MIC*    AMPICILLIN <=2 SENSITIVE Sensitive     LEVOFLOXACIN 2 SENSITIVE Sensitive  NITROFURANTOIN <=16 SENSITIVE Sensitive     VANCOMYCIN 1 SENSITIVE Sensitive     * 60,000 COLONIES/mL ENTEROCOCCUS FAECALIS    Medications:  Scheduled Meds: . apixaban  2.5 mg Oral BID  . atorvastatin  10 mg Oral q1800  . carvedilol  12.5 mg Oral BID WC  . FLUoxetine  20 mg Oral Daily  . latanoprost  1 drop Both Eyes BH-q7a  . mirtazapine  7.5 mg Oral QHS  . potassium chloride SA  20 mEq Oral Daily  . verapamil  360 mg Oral QHS   Continuous Infusions: . sodium chloride 125 mL/hr at 05/03/17 0800   PRN Meds:.hydrALAZINE, senna-docusate, traMADol Assessment/Plan: Principal Problem:   Hypertensive emergency Active Problems:   AKI (acute kidney injury) (HCC)   PAF (paroxysmal atrial fibrillation) (HCC)   Hypertensive urgency  Mrs. 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  This makes me think that she has an underlying UTI that is causing her symptoms.  This could have overlaid confusion caused by HTN and possibly stroke reactivation.   Plan:  -10 mg Hydralazine PRN to control systolic BP >160 -continue home meds carvedilol and verapamil  -treat UTI with inpatient, and discharge with amoxacillin  Paroxsymal Afib: The patient has nonspecific finding on EKG and troponin are normal, though we are trending it.  She is mildly bradycardic though this seems to be similar to her baseline Plan: -Continue home carvedilol and verapamil as above  Progressing CKD: The patient was admitted with a Ct of 1.4 which crept up to 1.5 later in the day.  She has had minnerly elevated Ct in the past, and a current GFR of 37 (previously 47), suggesting stage 3 CKD. This is likely due to her HTN which hopefully will resolve if she is better controlled.  Plan: -Control HTN as noted above   Constipation: Plan: continue home docusate for control  Dispo: Tomorrow   This is a Psychologist, occupational Note.  The care of the patient was discussed with Dr. Rogelia Boga and the assessment and plan formulated with their assistance.  Please see their attached note for official documentation of the daily encounter.   LOS: 1 day   Ames Coupe, Medical Student 05/03/2017, 11:00 AM

## 2017-05-03 NOTE — Progress Notes (Signed)
   Subjective:  Pt was seen and examined at bedside this morning. She reports feeling well and has no complaints. States she has been ambulating within her room without any gait problems. Reports having a bowel movement this morning. Pt is improved significantly in terms of her confusion.   Objective:  Vital signs in last 24 hours: Vitals:   05/02/17 1324 05/02/17 1741 05/02/17 2130 05/03/17 0548  BP: (!) 144/63 (!) 146/62 (!) 140/58 (!) 133/54  Pulse: (!) 57 64 (!) 57 (!) 58  Resp: 18 20 18 20   Temp: 98.3 F (36.8 C) 98.4 F (36.9 C) 98.2 F (36.8 C) 98.6 F (37 C)  TempSrc: Oral Oral Oral Oral  SpO2: 100% 98% 97% 98%  Weight:      Height:       Physical Exam  Constitutional: She appears well-developed. She is cooperative. No distress.  Cardiovascular: Normal rate, regular rhythm, normal heart sounds and normal pulses.  Exam reveals no gallop.   No murmur heard. Pulmonary/Chest: Effort normal and breath sounds normal. No respiratory distress. Breasts are symmetrical.  Abdominal: Soft. Bowel sounds are normal. There is no tenderness.  Musculoskeletal: She exhibits no edema.  Neurological: She is alert. No cranial nerve deficit or sensory deficit.  Oriented to person and time. Strength 5/5 in RUE and RLE. Strength 4/5 in LUE and LLE (chronic per patient, s/p prior stroke)   Psychiatric: She has a normal mood and affect.   Assessment/Plan:  Principal Problem:   Hypertensive emergency Active Problems:   AKI (acute kidney injury) (HCC)   PAF (paroxysmal atrial fibrillation) (HCC)   Hypertensive urgency  1) Hypertensive emergency w/ encephalopathy (resolved):  also on the differential is stroke reactivation given her history of stroke in March with left upper/lower extremity weakness and confusion. Blood pressure has improved. Mental status significantly more clear. Pt is not complaining of left lower extremity weakness and has been ambulate without any problem.  - continue  home meds: coreg 12.5mg  BID, verapamil 360mg  SR qHS  2) AKI: Cr 1.5 on admission; b/l SCr 1.2. Now improved to 1.4.  -Will continue to give IVF and trend in afternoon.  - continue to hold ACE/HCTZ; will restart on discharge if SCr continues to trend down  3) PAF: Currently in NSR. - continue home Eliquis - coreg/verap control as above  4) UTI: Urine cx growing >60,000 CFU of enterococcus. Patient denies having any urinary frequency, urgency, or dysuria. However, daughter at bedside reports the patient has been urinating more frequently for the past one week. -Macrobid 100 mg BID x 5 days    Dispo: Anticipated discharge in approximately 0-1 day(s) w/ HH PT.   John Giovanniathore, Tammy Wickliffe, MD 05/03/2017, 7:12 AM Pager: [phone]

## 2017-05-03 NOTE — Progress Notes (Signed)
Pt d/c to home by car with family. Assessment stable. All questions answered. 

## 2017-05-05 MED FILL — NITROFURANTOIN MONO-MCR 100: 100 | 3 days supply | Qty: 6 | Fill #0

## 2017-05-06 ENCOUNTER — Telehealth: Payer: Self-pay

## 2017-05-06 NOTE — Telephone Encounter (Signed)
Mr. Christina [name] called today requesting for continuing orders for 1xwk X 3wks, verbal orders given, stated also was confused on what our role is with this patients health care, expressed that we are NOT a PCP for this patient but we are a [facility] with [place]

## 2017-05-06 NOTE — Telephone Encounter (Signed)
starting next. [name] is also requesting verbal orders for Speech therapy evaluation and  nursing for medication management and disease management.  [name] found the following medications in the home but were not part of patient's discharge (Senokot and Symbastatan).  [name] did find Verapamil 200mg  (1 time a day) but the one that was prescribed in ED (Verapamil 180 mg two tablet 1 time a day.)  A referral for Social worker is needed as well. Caregiver is overwhelmed and would like information about community social services.   Pt was seen by [name] and [facility]   Thank you.

## 2017-05-06 NOTE — Telephone Encounter (Signed)
Christina [name] from [facility] called the office to get verbal orders for PT one time a week for 3 weeks, starting next. [name] is also requesting verbal orders for Speech therapy evaluation and  nursing for medication management and disease management.  [name] found the following medications in the home but were not part of patient's discharge (Senokot and Symbastatan).  [name] did find Verapamil 200mg  (1 time a day) but the one that was prescribed in ED (Verapamil 180 mg two tablet 1 time a day.)  A referral for Social worker is needed as well. Caregiver is overwhelmed and would like information about community social services.   Pt was seen by [name] and [name]   Thank you.

## 2017-05-07 ENCOUNTER — Other Ambulatory Visit: Payer: Self-pay | Admitting: Pharmacist

## 2017-05-07 DIAGNOSIS — B952 Enterococcus as the cause of diseases classified elsewhere: Secondary | ICD-10-CM

## 2017-05-07 DIAGNOSIS — N390 Urinary tract infection, site not specified: Secondary | ICD-10-CM

## 2017-05-07 DIAGNOSIS — I674 Hypertensive encephalopathy: Secondary | ICD-10-CM

## 2017-05-07 MED ORDER — VERAPAMIL HCL ER 180 MG PO TBCR: 360.0000 mg | EXTENDED_RELEASE_TABLET | Freq: Every day | ORAL | 0 refills | Status: DC

## 2017-05-07 MED FILL — VERAPAMIL ER 180 MG TABLET: 180 | 15 days supply | Qty: 30 | Fill #0

## 2017-05-07 NOTE — Telephone Encounter (Signed)
Received call from Christina [name] at front desk - patient present and needs a refill as she is out of verapamil. [facility]. Will refill.

## 2017-05-07 NOTE — Discharge Summary (Signed)
Name: Christina [name] MRN: [id number] DOB: 12-23-1946 71 y.o. PCP: System, Pcp Not In  Date of Admission: 05/01/2017  9:50 AM Date of Discharge: 05/03/2017 Attending Physician: Dr. Rogelia Boga  Discharge Diagnosis: Principal Problem:   Encephalopathy, hypertensive Active Problems:   AKI (acute kidney injury) (HCC)   PAF (paroxysmal atrial fibrillation) (HCC)   Hypertensive emergency   Enterococcus UTI   Discharge Medications: Allergies as of 05/03/2017   No Known Allergies     Medication List    TAKE these medications   apixaban 5 MG Tabs tablet Commonly known as:  ELIQUIS Take 1 tablet (5 mg total) by mouth 2 (two) times daily.   carvedilol 12.5 MG tablet Commonly known as:  COREG Take 1 tablet (12.5 mg total) by mouth 2 (two) times daily with a meal.   diclofenac sodium 1 % Gel Commonly known as:  VOLTAREN Apply 2 g topically 4 (four) times daily.   FLUoxetine 20 MG capsule Commonly known as:  PROZAC Take 1 capsule (20 mg total) by mouth daily.   hydrochlorothiazide 25 MG tablet Commonly known as:  HYDRODIURIL Take 1 tablet (25 mg total) by mouth daily.   latanoprost 0.005 % ophthalmic solution Commonly known as:  XALATAN Place 1 drop into both eyes every morning.   lidocaine 5 % Commonly known as:  LIDODERM Place 1 patch onto the skin daily. Remove & Discard patch within 12 hours or as directed by MD   losartan 100 MG tablet Commonly known as:  COZAAR Take 1 tablet (100 mg total) by mouth daily.   mirtazapine 15 MG disintegrating tablet Commonly known as:  REMERON SOL-TAB Take 0.5 tablets (7.5 mg total) by mouth at bedtime.   nitrofurantoin (macrocrystal-monohydrate) 100 MG capsule Commonly known as:  MACROBID Take 1 capsule (100 mg total) by mouth every 12 (twelve) hours.   potassium chloride SA 20 MEQ tablet Commonly known as:  K-DUR,KLOR-CON Take 1 tablet (20 mEq total) by mouth daily.   senna-docusate 8.6-50 MG tablet Commonly known as:   Senokot-S Take 1 tablet by mouth at bedtime as needed for moderate constipation.   simvastatin 20 MG tablet Commonly known as:  ZOCOR Take 1 tablet (20 mg total) by mouth daily.   traMADol 50 MG tablet Commonly known as:  ULTRAM Take 2 tablets (100 mg total) by mouth every 12 (twelve) hours as needed for moderate pain.       Disposition and follow-up:   Christina [name] was discharged from [facility] in Good condition.  At the hospital follow up visit please address:  1.  Hypertensive Encephalopathy: Assess MS. Check BP and titrate medications as needed. AKI: Consider recheck SCr to assess for return to b/l.  Follow-up Appointments: Follow-up Information    [facility]. Schedule an appointment as soon as possible for a visit.   Why:  Please call and make an appointment for a follow-up visit in 1 week.  Contact information: [address] [place] [address] [phone]          Hospital Course by problem list: Principal Problem:   Encephalopathy, hypertensive Active Problems:   AKI (acute kidney injury) (HCC)   PAF (paroxysmal atrial fibrillation) (HCC)   Hypertensive emergency   Enterococcus UTI   1. Hypertensive Emergency w/ Hypertensive Encephalopathy and AKI: Patient presented to the emergency department with a 4 day history of altered mental status in the setting of significantly elevated blood pressures with systolic greater than 200. Patient  had head CT and brain MRI which was negative for acute stroke. Patient reported that she had not had any recent changes in her antihypertensive medications but had been under some recent social stress after an argument with her sister. Patient's daughter was able to provide collateral information. Initially the patient was significantly confused and was oriented only to self unable to answer. Most cautions of concentration. However with adequate control of the patient's  blood pressure which responded well to IV hydralazine patient's mental status began to clear. Patient was started on gentle hydration through IV for correction of her AKI. Patient's home antihypertensive medications were restarted at patient demonstrated good blood pressure control on this regimen. Because her serum creatinine continued to trend down the patient was discharged to outpatient follow-up.  2. Enterococcus UTI: She was found to have abnormal UA which was sent for culture and grew greater than 60,000 colonies of enterococcus. Patient denied any urinary symptoms but given her age this was thought to be potentially contributing to her altered mental status and AK. Patient was started on Macrobid 100 mg twice a day for 5 days on the day of discharge.  Discharge Vitals:   BP (!) 182/68 (BP Location: Left Arm)   Pulse 66   Temp 99 F (37.2 C) (Oral)   Resp 18   Ht 5\' 2"  (1.575 m)   Wt 130 lb (59 kg)   SpO2 99%   BMI 23.78 kg/m   Pertinent Labs, Studies, and Procedures: Urine dipstick shows positive for WBC's.  Micro exam: too numerous to count WBC's per HPF and few bacteria. UCx grew enterococcus >60,000 CFU.  Procedures Performed:  Dg Chest 2 View  Result Date: 05/01/2017 CLINICAL DATA:  Pt presented with AMS today, she says she has been confused lately - pt states not having any pain or breathing issues - hx of htn, stroke EXAM: CHEST  2 VIEW COMPARISON:  None. FINDINGS: Cardiac silhouette is borderline enlarged. No mediastinal or hilar masses. No evidence of adenopathy. Well-defined relatively dense nodule lies in the right upper lobe near the apex consistent with a granuloma. Lungs are otherwise clear. No pleural effusion or pneumothorax. Skeletal structures are demineralized but grossly intact. IMPRESSION: No active cardiopulmonary disease. Electronically Signed   By: Amie Portland M.D.   On: 05/01/2017 10:43   Ct Head Wo Contrast  Result Date: 05/01/2017 CLINICAL DATA:   Confusion for a few days with hypertension. Prior strokes. EXAM: CT HEAD WITHOUT CONTRAST TECHNIQUE: Contiguous axial images were obtained from the base of the skull through the vertex without intravenous contrast. COMPARISON:  03/15/2017 head and neck CTA. Head CT and MRI 03/14/2017. FINDINGS: Brain: The previously demonstrated medial right frontal lobe infarct demonstrates expected interval evolution and now has a more chronic appearance with development of encephalomalacia. There is a small focus of subtle hypodensity in the dorsal right thalamus corresponding to the lacunar infarct on MRI. Small left parieto-occipital infarcts also now have a more chronic appearance. Small chronic infarcts are again seen in the bilateral cerebellum, left cerebral white matter, and left lentiform nucleus. Additional confluent periventricular white matter hypoattenuation bilaterally is unchanged and compatible with moderate chronic small vessel ischemic disease. No definite acute infarct, intracranial hemorrhage, mass, midline shift, or extra-axial fluid collection is seen. Prominence of the lateral and third ventricles is unchanged and may reflect cerebral atrophy or normal pressure hydrocephalus. Vascular: Calcified atherosclerosis at the skullbase. No hyperdense vessel. Skull: No fracture or focal osseous lesion. Sinuses/Orbits: Visualized paranasal sinuses  and mastoid air cells are clear. Bilateral cataract extraction is noted. Other: None. IMPRESSION: 1. No evidence of acute intracranial abnormality. 2. Extensive chronic ischemic changes as above, with expected interval evolution of subacute infarcts demonstrated in 02/2017. Electronically Signed   By: Sebastian AcheAllen  Grady M.D.   On: 05/01/2017 11:03   Mr Brain Wo Contrast  Result Date: 05/01/2017 CLINICAL DATA:  71 year old female with weakness and confusion for 4 days. Hypertension. Prior strokes, most recently in March. EXAM: MRI HEAD WITHOUT CONTRAST TECHNIQUE: Multiplanar,  multiecho pulse sequences of the brain and surrounding structures were obtained without intravenous contrast. COMPARISON:  Head CT without contrast 1048 hours today. CTA head and neck 03/15/2017. Brain MRI 03/14/2017. FINDINGS: Brain: Evolving encephalomalacia in the right superior frontal gyrus corresponding to distal right ACA territory. Trace hemosiderin. No associated mass effect. Mild marginal restricted diffusion at this infarct site (series 5, image 21) which was acute on 03/14/2017. There are several foci of persistent diffusion abnormality in the left periatrial white matter and left occipital lobe, but these appear to be the residual of the March 2018 process. No associated hemorrhage or mass effect here. No brand new areas of restricted diffusion are identified. Stable cerebral volume. No midline shift, mass effect, evidence of mass lesion, ventriculomegaly, extra-axial collection or acute intracranial hemorrhage. Cervicomedullary junction and pituitary are within normal limits. In addition to the above findings scattered cerebral white matter T2 and FLAIR hyperintensity plus cystic encephalomalacia is stable. Small chronic bilateral cerebellar infarcts, more so on the left, are stable. Stable deep gray matter nuclei and brainstem. Vascular: Major intracranial vascular flow voids are stable since March. Skull and upper cervical spine: Negative. Normal bone marrow signal. Sinuses/Orbits: Stable and negative. Other: Mastoids remain clear. Visible internal auditory structures appear normal. Negative scalp soft tissues. IMPRESSION: 1. Evolved and mildly expanded posterior right ACA infarct since 03/14/2017 with areas of continued marginal ischemia. Mild hemosiderin, but no malignant hemorrhagic transformation or mass effect. 2. Expected evolution of small posterior left hemisphere infarcts seen in March. Underlying chronic cerebral white matter and cerebellar small vessel disease. 3. No brand new areas of  ischemia. Electronically Signed   By: Odessa FlemingH  Hall M.D.   On: 05/01/2017 12:31   Discharge Instructions: Take Macrobid twice daily for 5 days. Please control your BP with your medications and follow up with your PCP.  SignedCarolynn Comment: Janaiya Beauchesne, MD 05/07/2017, 11:28 AM   Pager: [phone]

## 2017-05-08 ENCOUNTER — Telehealth: Payer: Self-pay | Admitting: Registered Nurse

## 2017-05-08 ENCOUNTER — Ambulatory Visit: Payer: Medicare Other | Attending: Family Medicine | Admitting: Family Medicine

## 2017-05-08 ENCOUNTER — Encounter: Payer: Self-pay | Admitting: Family Medicine

## 2017-05-08 ENCOUNTER — Other Ambulatory Visit: Payer: Self-pay | Admitting: Family Medicine

## 2017-05-08 VITALS — BP 142/67 | HR 61 | Temp 98.1°F | Resp 18 | Ht 63.0 in | Wt 137.8 lb

## 2017-05-08 DIAGNOSIS — Z0000 Encounter for general adult medical examination without abnormal findings: Secondary | ICD-10-CM

## 2017-05-08 DIAGNOSIS — I10 Essential (primary) hypertension: Secondary | ICD-10-CM | POA: Diagnosis present

## 2017-05-08 DIAGNOSIS — F419 Anxiety disorder, unspecified: Secondary | ICD-10-CM

## 2017-05-08 DIAGNOSIS — I6930 Unspecified sequelae of cerebral infarction: Secondary | ICD-10-CM

## 2017-05-08 DIAGNOSIS — M79604 Pain in right leg: Secondary | ICD-10-CM

## 2017-05-08 DIAGNOSIS — N3000 Acute cystitis without hematuria: Secondary | ICD-10-CM | POA: Diagnosis not present

## 2017-05-08 DIAGNOSIS — Z87440 Personal history of urinary (tract) infections: Secondary | ICD-10-CM

## 2017-05-08 DIAGNOSIS — Z7901 Long term (current) use of anticoagulants: Secondary | ICD-10-CM | POA: Diagnosis not present

## 2017-05-08 DIAGNOSIS — E785 Hyperlipidemia, unspecified: Secondary | ICD-10-CM

## 2017-05-08 LAB — POCT URINALYSIS DIPSTICK
Bilirubin, UA: NEGATIVE
GLUCOSE UA: NEGATIVE
Ketones, UA: NEGATIVE
NITRITE UA: NEGATIVE
Protein, UA: 30
RBC UA: NEGATIVE
Spec Grav, UA: 1.015 (ref 1.010–1.025)
UROBILINOGEN UA: 0.2 U/dL
pH, UA: 7 (ref 5.0–8.0)

## 2017-05-08 MED ORDER — FLUOXETINE HCL 40 MG PO CAPS: 40.0000 mg | ORAL_CAPSULE | Freq: Every day | ORAL | 2 refills | Status: DC

## 2017-05-08 MED ORDER — PNEUMOCOCCAL VAC POLYVALENT 25 MCG/0.5ML IJ INJ
0.5000 mL | INJECTION | Freq: Once | INTRAMUSCULAR | Status: AC
  Administered 2017-05-08: 0.5 mL via INTRAMUSCULAR

## 2017-05-08 MED ORDER — DICLOFENAC SODIUM 1 % TD GEL: 2.0000 g | Freq: Four times a day (QID) | TRANSDERMAL | 1 refills | Status: DC | PRN

## 2017-05-08 MED ORDER — VERAPAMIL HCL ER 180 MG PO TBCR: 180.0000 mg | EXTENDED_RELEASE_TABLET | Freq: Two times a day (BID) | ORAL | 2 refills | Status: DC

## 2017-05-08 MED ORDER — MIRTAZAPINE 15 MG PO TBDP: 15.0000 mg | ORAL_TABLET | Freq: Every day | ORAL | 2 refills | Status: DC

## 2017-05-08 MED ORDER — NITROFURANTOIN MONOHYD MACRO 100 MG PO CAPS: 100.0000 mg | ORAL_CAPSULE | Freq: Two times a day (BID) | ORAL | 0 refills | Status: DC

## 2017-05-08 MED ORDER — ACETAMINOPHEN 500 MG PO TABS: 1000.0000 mg | ORAL_TABLET | Freq: Four times a day (QID) | ORAL | 0 refills | Status: DC | PRN

## 2017-05-08 MED ORDER — SIMVASTATIN 20 MG PO TABS: 20.0000 mg | ORAL_TABLET | Freq: Every day | ORAL | 0 refills | Status: DC

## 2017-05-08 MED ORDER — APIXABAN 5 MG PO TABS: 5.0000 mg | ORAL_TABLET | Freq: Two times a day (BID) | ORAL | 2 refills | Status: DC

## 2017-05-08 MED ORDER — CLONIDINE HCL 0.1 MG PO TABS
0.1000 mg | ORAL_TABLET | Freq: Once | ORAL | Status: AC
  Administered 2017-05-08: 0.1 mg via ORAL

## 2017-05-08 MED ORDER — CARVEDILOL 25 MG PO TABS: 25.0000 mg | ORAL_TABLET | Freq: Two times a day (BID) | ORAL | 2 refills | Status: DC

## 2017-05-08 NOTE — Telephone Encounter (Signed)
Return Mr. Christina [name] call from [facility]: Left Message Orders given for Medical Social Worker for [employer] and [employer]ographer[employer]ech Therapist Evaluation.

## 2017-05-08 NOTE — Telephone Encounter (Signed)
Christina [name] Jim with [facility] to follow up. Verbal orders given. He is requesting faxed copy of office visit for 05/08/2017. Once chart is completed, fax will needed to be sent early tomorrow am.

## 2017-05-08 NOTE — Progress Notes (Signed)
Patient is here for f/up  Patient complains about right leg pain that comes & goes   Patient has taking her medication for today   Patient has eaten for today

## 2017-05-08 NOTE — Patient Instructions (Addendum)
You will be contacted by case management concerning home health aide.  You will be called with your labs results.   DASH Eating Plan DASH stands for "Dietary Approaches to Stop Hypertension." The DASH eating plan is a healthy eating plan that has been shown to reduce high blood pressure (hypertension). It may also reduce your risk for type 2 diabetes, heart disease, and stroke. The DASH eating plan may also help with weight loss. What are tips for following this plan? General guidelines   Avoid eating more than 2,300 mg (milligrams) of salt (sodium) a day. If you have hypertension, you may need to reduce your sodium intake to 1,500 mg a day.  Limit alcohol intake to no more than 1 drink a day for nonpregnant women and 2 drinks a day for men. One drink equals 12 oz of beer, 5 oz of wine, or 1 oz of hard liquor.  Work with your health care provider to maintain a healthy body weight or to lose weight. Ask what an ideal weight is for you.  Get at least 30 minutes of exercise that causes your heart to beat faster (aerobic exercise) most days of the week. Activities may include walking, swimming, or biking.  Work with your health care provider or diet and nutrition specialist (dietitian) to adjust your eating plan to your individual calorie needs. Reading food labels   Check food labels for the amount of sodium per serving. Choose foods with less than 5 percent of the Daily Value of sodium. Generally, foods with less than 300 mg of sodium per serving fit into this eating plan.  To find whole grains, look for the word "whole" as the first word in the ingredient list. Shopping   Buy products labeled as "low-sodium" or "no salt added."  Buy fresh foods. Avoid canned foods and premade or frozen meals. Cooking   Avoid adding salt when cooking. Use salt-free seasonings or herbs instead of table salt or sea salt. Check with your health care provider or pharmacist before using salt  substitutes.  Do not fry foods. Cook foods using healthy methods such as baking, boiling, grilling, and broiling instead.  Cook with heart-healthy oils, such as olive, canola, soybean, or sunflower oil. Meal planning    Eat a balanced diet that includes:  5 or more servings of fruits and vegetables each day. At each meal, try to fill half of your plate with fruits and vegetables.  Up to 6-8 servings of whole grains each day.  Less than 6 oz of lean meat, poultry, or fish each day. A 3-oz serving of meat is about the same size as a deck of cards. One egg equals 1 oz.  2 servings of low-fat dairy each day.  A serving of nuts, seeds, or beans 5 times each week.  Heart-healthy fats. Healthy fats called Omega-3 fatty acids are found in foods such as flaxseeds and coldwater fish, like sardines, salmon, and mackerel.  Limit how much you eat of the following:  Canned or prepackaged foods.  Food that is high in trans fat, such as fried foods.  Food that is high in saturated fat, such as fatty meat.  Sweets, desserts, sugary drinks, and other foods with added sugar.  Full-fat dairy products.  Do not salt foods before eating.  Try to eat at least 2 vegetarian meals each week.  Eat more home-cooked food and less restaurant, buffet, and fast food.  When eating at a restaurant, ask that your food be prepared  with less salt or no salt, if possible. What foods are recommended? The items listed may not be a complete list. Talk with your dietitian about what dietary choices are best for you. Grains  Whole-grain or whole-wheat bread. Whole-grain or whole-wheat pasta. Brown rice. [place]ena Morrow. Bulgur. Whole-grain and low-sodium cereals. Pita bread. Low-fat, low-sodium crackers. Whole-wheat flour tortillas. Vegetables  Fresh or frozen vegetables (raw, steamed, roasted, or grilled). Low-sodium or reduced-sodium tomato and vegetable juice. Low-sodium or reduced-sodium tomato sauce and  tomato paste. Low-sodium or reduced-sodium canned vegetables. Fruits  All fresh, dried, or frozen fruit. Canned fruit in natural juice (without added sugar). Meat and other protein foods  Skinless chicken or [place]. Ground chicken or [place]. Pork with fat trimmed off. Fish and seafood. Egg whites. Dried beans, peas, or lentils. Unsalted nuts, nut butters, and seeds. Unsalted canned beans. Lean cuts of beef with fat trimmed off. Low-sodium, lean deli meat. Dairy  Low-fat (1%) or fat-free (skim) milk. Fat-free, low-fat, or reduced-fat cheeses. Nonfat, low-sodium ricotta or cottage cheese. Low-fat or nonfat yogurt. Low-fat, low-sodium cheese. Fats and oils  Soft margarine without trans fats. Vegetable oil. Low-fat, reduced-fat, or light mayonnaise and salad dressings (reduced-sodium). Canola, safflower, olive, soybean, and sunflower oils. Avocado. Seasoning and other foods  Herbs. Spices. Seasoning mixes without salt. Unsalted popcorn and pretzels. Fat-free sweets. What foods are not recommended? The items listed may not be a complete list. Talk with your dietitian about what dietary choices are best for you. Grains  Baked goods made with fat, such as croissants, muffins, or some breads. Dry pasta or rice meal packs. Vegetables  Creamed or fried vegetables. Vegetables in a cheese sauce. Regular canned vegetables (not low-sodium or reduced-sodium). Regular canned tomato sauce and paste (not low-sodium or reduced-sodium). Regular tomato and vegetable juice (not low-sodium or reduced-sodium). Angie Fava. Olives. Fruits  Canned fruit in a light or heavy syrup. Fried fruit. Fruit in cream or butter sauce. Meat and other protein foods  Fatty cuts of meat. Ribs. Fried meat. Berniece Salines. Sausage. Bologna and other processed lunch meats. Salami. Fatback. Hotdogs. Bratwurst. Salted nuts and seeds. Canned beans with added salt. Canned or smoked fish. Whole eggs or egg yolks. Chicken or [place] with skin. Dairy  Whole  or 2% milk, cream, and half-and-half. Whole or full-fat cream cheese. Whole-fat or sweetened yogurt. Full-fat cheese. Nondairy creamers. Whipped toppings. Processed cheese and cheese spreads. Fats and oils  Butter. Stick margarine. Lard. Shortening. Ghee. Bacon fat. Tropical oils, such as coconut, palm kernel, or palm oil. Seasoning and other foods  Salted popcorn and pretzels. Onion salt, garlic salt, seasoned salt, table salt, and sea salt. Worcestershire sauce. Tartar sauce. Barbecue sauce. Teriyaki sauce. Soy sauce, including reduced-sodium. Steak sauce. Canned and packaged gravies. Fish sauce. Oyster sauce. Cocktail sauce. Horseradish that you find on the shelf. Ketchup. Mustard. Meat flavorings and tenderizers. Bouillon cubes. Hot sauce and Tabasco sauce. Premade or packaged marinades. Premade or packaged taco seasonings. Relishes. Regular salad dressings. Where to find more information:  National Heart, Lung, and Time: https://wilson-eaton.com/  American Heart Association: www.heart.org Summary  The DASH eating plan is a healthy eating plan that has been shown to reduce high blood pressure (hypertension). It may also reduce your risk for type 2 diabetes, heart disease, and stroke.  With the DASH eating plan, you should limit salt (sodium) intake to 2,300 mg a day. If you have hypertension, you may need to reduce your sodium intake to 1,500 mg a day.  When on the  DASH eating plan, aim to eat more fresh fruits and vegetables, whole grains, lean proteins, low-fat dairy, and heart-healthy fats.  Work with your health care provider or diet and nutrition specialist (dietitian) to adjust your eating plan to your individual calorie needs. This information is not intended to replace advice given to you by your health care provider. Make sure you discuss any questions you have with your health care provider. Document Released: 11/28/2011 Document Revised: 12/02/2016 Document Reviewed:  12/02/2016 Elsevier Interactive Patient Education  2017 Elsevier Inc.   Hypertension Hypertension is another name for high blood pressure. High blood pressure forces your heart to work harder to pump blood. This can cause problems over time. There are two numbers in a blood pressure reading. There is a top number (systolic) over a bottom number (diastolic). It is best to have a blood pressure below 120/80. Healthy choices can help lower your blood pressure. You may need medicine to help lower your blood pressure if:  Your blood pressure cannot be lowered with healthy choices.  Your blood pressure is higher than 130/80. Follow these instructions at home: Eating and drinking   If directed, follow the DASH eating plan. This diet includes:  Filling half of your plate at each meal with fruits and vegetables.  Filling one quarter of your plate at each meal with whole grains. Whole grains include whole wheat pasta, brown rice, and whole grain bread.  Eating or drinking low-fat dairy products, such as skim milk or low-fat yogurt.  Filling one quarter of your plate at each meal with low-fat (lean) proteins. Low-fat proteins include fish, skinless chicken, eggs, beans, and tofu.  Avoiding fatty meat, cured and processed meat, or chicken with skin.  Avoiding premade or processed food.  Eat less than 1,500 mg of salt (sodium) a day.  Limit alcohol use to no more than 1 drink a day for nonpregnant women and 2 drinks a day for men. One drink equals 12 oz of beer, 5 oz of wine, or 1 oz of hard liquor. Lifestyle   Work with your doctor to stay at a healthy weight or to lose weight. Ask your doctor what the best weight is for you.  Get at least 30 minutes of exercise that causes your heart to beat faster (aerobic exercise) most days of the week. This may include walking, swimming, or biking.  Get at least 30 minutes of exercise that strengthens your muscles (resistance exercise) at least 3 days  a week. This may include lifting weights or pilates.  Do not use any products that contain nicotine or tobacco. This includes cigarettes and e-cigarettes. If you need help quitting, ask your doctor.  Check your blood pressure at home as told by your doctor.  Keep all follow-up visits as told by your doctor. This is important. Medicines   Take over-the-counter and prescription medicines only as told by your doctor. Follow directions carefully.  Do not skip doses of blood pressure medicine. The medicine does not work as well if you skip doses. Skipping doses also puts you at risk for problems.  Ask your doctor about side effects or reactions to medicines that you should watch for. Contact a doctor if:  You think you are having a reaction to the medicine you are taking.  You have headaches that keep coming back (recurring).  You feel dizzy.  You have swelling in your ankles.  You have trouble with your vision. Get help right away if:  You get a very  bad headache.  You start to feel confused.  You feel weak or numb.  You feel faint.  You get very bad pain in your:  Chest.  Belly (abdomen).  You throw up (vomit) more than once.  You have trouble breathing. Summary  Hypertension is another name for high blood pressure.  Making healthy choices can help lower blood pressure. If your blood pressure cannot be controlled with healthy choices, you may need to take medicine. This information is not intended to replace advice given to you by your health care provider. Make sure you discuss any questions you have with your health care provider. Document Released: 05/27/2008 Document Revised: 11/06/2016 Document Reviewed: 11/06/2016 Elsevier Interactive Patient Education  2017 [employer].

## 2017-05-08 NOTE — Progress Notes (Signed)
Subjective:  Patient ID: Christina [name], female    DOB: 1946-04-29  Age: 71 y.o. MRN: [id number]  CC: Hypertension   HPI Christina [name] presents for  History of CVA:  71 y.o. female with history of poorly controlled HTN and tobacco use. History of [name] admission for CVA in March 2018. Reports left sided residual weakness since CVA. Recent history of [name] admission on 04/16/2017. Patient was brought to the ED for intermittent confusion for several days. Workup was done which included CT scan, MRI, and labs. She denies any CP, SOB, swelling of the lower extremities, syncope, or vision changes. She was also diagnosed with recent history of UTI and antibiotic was prescribed. She denies any pelvic or abdominal pain, dysuria, hematuria, or foul smelling urine. Reports completing her course of antibiotic. Patient lives with her daughter.Her daughter reports patient is receiving PT home once a week since April. Last PT visit was 2 weeks ago.Patient reports ability to perform ADL's with minimal assistance. She does report activity intolerance, reports not being able to walk for more than 20 minutes at a time without having to sit down. Takes daily BP's every am at home. Reports BP yesterday was 140/70.  SBP average 160's and DBP 70's most days. Daughter requests home health aide. Daughter is currently paying patient's granddaughter to take care of patient when she is not available, and reports feeling overwhelmed.   Anxiety: Patient complains of anxiety.  She has the following symptoms: difficulty concentrating, insomnia, racing thoughts, and shakiness. . Onset of symptoms was approximately 2 months ago, unchanged since that time. She denies current suicidal and homicidal ideation. Family history significant for no psychiatric illness.Possible organic causes contributing are: none. Risk factors: negative life event history of CVA in March. Previous treatment includes Prozac and mirtazapine.She complains of  the following side effects from the treatment: none.  Hypertension: Patient here for follow-up of elevated blood pressure. She working with PT for physical activity. exercising and is not adherent to low salt diet.  Blood pressure is not well controlled at home. Cardiac symptoms none. Patient denies none.  Cardiovascular risk factors: advanced age (older than 97 for men, 31 for women), dyslipidemia, hypertension, sedentary lifestyle and smoking/ tobacco exposure. Use of agents associated with hypertension: none. History of target organ damage: stroke and history of AKI. She reports family history of HTN mother and sisters. She is a former smoker. She reports quitting 7 months ago. Smoking history 6 years, with 3 to 4 cigarettes per day.  Joint/Muscle Pain: Patient complains of myalgias for which has been present for 2 days. Pain is located in right leg, is described as aching, and is moderate .  Associated symptoms include: none. She denies any swelling, temperature changes, or spasms to the extremity.   The patient has tried nothing for pain relief.  Related to injury: no.    Outpatient Medications Prior to Visit  Medication Sig Dispense Refill  . hydrochlorothiazide (HYDRODIURIL) 25 MG tablet Take 1 tablet (25 mg total) by mouth daily. 30 tablet 1  . latanoprost (XALATAN) 0.005 % ophthalmic solution Place 1 drop into both eyes every morning. 2.5 mL 12  . lidocaine (LIDODERM) 5 % Place 1 patch onto the skin daily. Remove & Discard patch within 12 hours or as directed by MD 30 patch 0  . losartan (COZAAR) 100 MG tablet Take 1 tablet (100 mg total) by mouth daily. 30 tablet 1  . potassium chloride SA (K-DUR,KLOR-CON) 20 MEQ tablet Take 1 tablet (20  mEq total) by mouth daily. 30 tablet 1  . senna-docusate (SENOKOT-S) 8.6-50 MG tablet Take 1 tablet by mouth at bedtime as needed for moderate constipation. 30 tablet 1  . traMADol (ULTRAM) 50 MG tablet Take 2 tablets (100 mg total) by mouth every 12  (twelve) hours as needed for moderate pain. 20 tablet 0  . apixaban (ELIQUIS) 5 MG TABS tablet Take 1 tablet (5 mg total) by mouth 2 (two) times daily. 60 tablet 1  . carvedilol (COREG) 12.5 MG tablet Take 1 tablet (12.5 mg total) by mouth 2 (two) times daily with a meal. 60 tablet 1  . diclofenac sodium (VOLTAREN) 1 % GEL Apply 2 g topically 4 (four) times daily. 1 Tube 1  . FLUoxetine (PROZAC) 20 MG capsule Take 1 capsule (20 mg total) by mouth daily. 30 capsule 3  . mirtazapine (REMERON SOL-TAB) 15 MG disintegrating tablet Take 0.5 tablets (7.5 mg total) by mouth at bedtime. 30 tablet 1  . nitrofurantoin, macrocrystal-monohydrate, (MACROBID) 100 MG capsule Take 1 capsule (100 mg total) by mouth every 12 (twelve) hours. 6 capsule 0  . simvastatin (ZOCOR) 20 MG tablet Take 1 tablet (20 mg total) by mouth daily. 30 tablet 1  . verapamil (CALAN-SR) 180 MG CR tablet Take 2 tablets (360 mg total) by mouth at bedtime. 30 tablet 0   No facility-administered medications prior to visit.     ROS Review of Systems  HENT: Negative for trouble swallowing.   Eyes: Negative.   Respiratory: Negative.   Cardiovascular: Negative.   Gastrointestinal: Negative.   Genitourinary: Negative.   Musculoskeletal: Positive for myalgias.  Neurological: Positive for weakness (cva residual -left sided weakness).  Psychiatric/Behavioral: The patient is nervous/anxious.    Objective:  BP (!) 142/67   Pulse 61   Temp 98.1 F (36.7 C) (Oral)   Resp 18   Ht 5\' 3"  (1.6 m)   Wt 137 lb 12.8 oz (62.5 kg)   SpO2 100%   BMI 24.41 kg/m   BP/Weight 05/08/2017 05/03/2017 05/01/2017  Systolic BP 142 182 -  Diastolic BP 67 68 -  Wt. (Lbs) 137.8 - 130  BMI 24.41 - 23.78   Physical Exam  Constitutional: She is oriented to person, place, and time. She appears well-developed and well-nourished.  HENT:  Head: Normocephalic and atraumatic.  Right Ear: External ear normal.  Left Ear: External ear normal.  Nose: Nose  normal.  Mouth/Throat: Oropharynx is clear and moist.  Eyes: Conjunctivae and EOM are normal. Pupils are equal, round, and reactive to light.  Neck: No JVD present.  Cardiovascular: Normal rate, regular rhythm, normal heart sounds and intact distal pulses.   Pulmonary/Chest: Effort normal and breath sounds normal.  Abdominal: Soft. Bowel sounds are normal.  Musculoskeletal:  Wheelchair use. Left sided muscle weakness LUE/LLE. 4/5 muscle strength.   Neurological: She is alert and oriented to person, place, and time. She has normal reflexes.  Skin: Skin is warm and dry.  Psychiatric: Her mood appears anxious. She expresses no homicidal and no suicidal ideation. She expresses no suicidal plans and no homicidal plans.  Nursing note and vitals reviewed.  Assessment & Plan:   Problem List Items Addressed This Visit      Cardiovascular and Mediastinum   HTN (hypertension) - Primary   Relevant Medications   cloNIDine (CATAPRES) tablet 0.1 mg (Completed)   carvedilol (COREG) 25 MG tablet   verapamil (CALAN-SR) 180 MG CR tablet   apixaban (ELIQUIS) 5 MG TABS tablet   simvastatin (  ZOCOR) 20 MG tablet   Other Relevant Orders   Basic metabolic panel (Completed)   Lipid Panel (Completed)   Ambulatory referral to Ophthalmology     Genitourinary   Acute cystitis without hematuria   Relevant Medications   nitrofurantoin, macrocrystal-monohydrate, (MACROBID) 100 MG capsule   Other Relevant Orders   Urine culture    Other Visit Diagnoses    Musculoskeletal leg pain, right       Relevant Medications   acetaminophen (TYLENOL) 500 MG tablet   diclofenac sodium (VOLTAREN) 1 % GEL   Anxiety       Relevant Medications   FLUoxetine (PROZAC) 40 MG capsule   mirtazapine (REMERON SOL-TAB) 15 MG disintegrating tablet   Dyslipidemia       Relevant Medications   simvastatin (ZOCOR) 20 MG tablet   Other Relevant Orders   Lipid Panel (Completed)   History of UTI       Relevant Medications    pneumococcal 23 valent vaccine (PNU-IMMUNE) injection 0.5 mL (Completed)   nitrofurantoin, macrocrystal-monohydrate, (MACROBID) 100 MG capsule   Other Relevant Orders   Urinalysis Dipstick (Completed)   Urine culture   History of CVA with residual deficit       Will refer to in-house case management and LCSW for resources.   Relevant Medications   apixaban (ELIQUIS) 5 MG TABS tablet   Other Relevant Orders   Ambulatory referral to Ophthalmology   Healthcare maintenance       Relevant Medications   pneumococcal 23 valent vaccine (PNU-IMMUNE) injection 0.5 mL (Completed)   Other Relevant Orders   DG Bone Density      Meds ordered this encounter  Medications  . cloNIDine (CATAPRES) tablet 0.1 mg  . pneumococcal 23 valent vaccine (PNU-IMMUNE) injection 0.5 mL  . carvedilol (COREG) 25 MG tablet    Sig: Take 1 tablet (25 mg total) by mouth 2 (two) times daily with a meal.    Dispense:  60 tablet    Refill:  2    Order Specific Question:   Supervising Provider    Answer:   Quentin Angst [id number]  . verapamil (CALAN-SR) 180 MG CR tablet    Sig: Take 1 tablet (180 mg total) by mouth 2 (two) times daily.    Dispense:  60 tablet    Refill:  2    Order Specific Question:   Supervising Provider    Answer:   Quentin Angst [id number]  . apixaban (ELIQUIS) 5 MG TABS tablet    Sig: Take 1 tablet (5 mg total) by mouth 2 (two) times daily.    Dispense:  60 tablet    Refill:  2    Order Specific Question:   Supervising Provider    Answer:   Quentin Angst [id number]  . FLUoxetine (PROZAC) 40 MG capsule    Sig: Take 1 capsule (40 mg total) by mouth daily.    Dispense:  30 capsule    Refill:  2    Order Specific Question:   Supervising Provider    Answer:   Quentin Angst [id number]  . simvastatin (ZOCOR) 20 MG tablet    Sig: Take 1 tablet (20 mg total) by mouth daily.    Dispense:  30 tablet    Refill:  0    Order Specific Question:   Supervising Provider     Answer:   Quentin Angst [id number]  . mirtazapine (REMERON SOL-TAB) 15 MG disintegrating tablet    Sig: Take 1 tablet (  15 mg total) by mouth at bedtime.    Dispense:  30 tablet    Refill:  2    Order Specific Question:   Supervising Provider    Answer:   Quentin AngstJEGEDE, OLUGBEMIGA E [id number]]  . nitrofurantoin, macrocrystal-monohydrate, (MACROBID) 100 MG capsule    Sig: Take 1 capsule (100 mg total) by mouth 2 (two) times daily.    Dispense:  10 capsule    Refill:  0    Order Specific Question:   Supervising Provider    Answer:   Quentin AngstJEGEDE, OLUGBEMIGA E [id number]  . acetaminophen (TYLENOL) 500 MG tablet    Sig: Take 2 tablets (1,000 mg total) by mouth every 6 (six) hours as needed.    Dispense:  30 tablet    Refill:  0    Order Specific Question:   Supervising Provider    Answer:   Quentin AngstJEGEDE, OLUGBEMIGA E [id number]  . diclofenac sodium (VOLTAREN) 1 % GEL    Sig: Apply 2 g topically 4 (four) times daily as needed.    Dispense:  1 Tube    Refill:  1    Order Specific Question:   Supervising Provider    Answer:   Quentin AngstJEGEDE, OLUGBEMIGA E [id number]    Follow-up: Return in about 2 weeks (around 05/22/2017) for BP check with clinical pharmacist.   Christina BarkMandesia R Mertice Uffelman FNP

## 2017-05-09 ENCOUNTER — Telehealth: Payer: Self-pay

## 2017-05-09 ENCOUNTER — Telehealth: Payer: Self-pay | Admitting: Family Medicine

## 2017-05-09 LAB — LIPID PANEL
CHOL/HDL RATIO: 3.6 ratio (ref 0.0–4.4)
Cholesterol, Total: 166 mg/dL (ref 100–199)
HDL: 46 mg/dL (ref >39)
LDL Calculated: 84 mg/dL (ref 0–99)
TRIGLYCERIDES: 182 mg/dL — AB (ref 0–149)
VLDL Cholesterol Cal: 36 mg/dL (ref 5–40)

## 2017-05-09 LAB — BASIC METABOLIC PANEL
BUN/Creatinine Ratio: 16 (ref 12–28)
BUN: 20 mg/dL (ref 8–27)
CHLORIDE: 104 mmol/L (ref 96–106)
CO2: 25 mmol/L (ref 18–29)
Calcium: 9.4 mg/dL (ref 8.7–10.3)
Creatinine, Ser: 1.26 mg/dL — ABNORMAL HIGH (ref 0.57–1.00)
GFR, EST AFRICAN AMERICAN: 50 mL/min/{1.73_m2} — AB (ref >59)
GFR, EST NON AFRICAN AMERICAN: 43 mL/min/{1.73_m2} — AB (ref >59)
Glucose: 90 mg/dL (ref 65–99)
POTASSIUM: 3.7 mmol/L (ref 3.5–5.2)
SODIUM: 142 mmol/L (ref 134–144)

## 2017-05-09 NOTE — Telephone Encounter (Signed)
Threasa AlphaJim Hoffman [facility] [phone]) requesting verbal orders for a Nurse Evaluation for Disease and medication management.   Called Mr. Hoffman back and and O.K.'d the verbal orders

## 2017-05-09 NOTE — Telephone Encounter (Signed)
Message received from Arrie SenateMandesia Hairston, FNP requesting this CM contact the patient/family regarding resources available for assisting with the patient's care.  Call placed to # [phone] (H) and a HIPAA compliant voicemail message was left requesting a call back to # [phone].   Request also received for home health referral for nursing, PT, ST. [facility] spoke to [place] who confirmed that the patient is already receiving RN, PT and OT.   ST was finished today.   Message routed to [facility] California[facility]. Jenelle MagesHairston, FNP.

## 2017-05-09 NOTE — Telephone Encounter (Signed)
Boneta LucksJenny from [facility] called the office to request verbal orders for speech therapy for one visit next week. Please follow up.  Thank you.

## 2017-05-10 LAB — URINE CULTURE

## 2017-05-12 NOTE — Telephone Encounter (Signed)
Attempted to contact the patient /daughter to discuss options for care at home. Call placed to # [phone] (H) and a HIPAA compliant voicemail message was left requesting a call back to # [phone].

## 2017-05-13 ENCOUNTER — Telehealth: Payer: Self-pay | Admitting: *Deleted

## 2017-05-13 NOTE — Telephone Encounter (Signed)
Attempted again to contact the patient/daughter to discuss options for assistance at home. Call placed to # [phone] and a HIPAA compliant voicemail message was left requesting a call back to # [phone]

## 2017-05-13 NOTE — Telephone Encounter (Signed)
Physical therapist left a message asking for 'new' verbal orders for skilled nursing visit for disease mgmt. Med mgmt. Chronic hypertension, and stroke.  I contacted and gave verbal orders per office protocol

## 2017-05-14 ENCOUNTER — Encounter (HOSPITAL_BASED_OUTPATIENT_CLINIC_OR_DEPARTMENT_OTHER): Payer: Medicare Other | Admitting: Physical Medicine & Rehabilitation

## 2017-05-14 ENCOUNTER — Telehealth: Payer: Self-pay

## 2017-05-14 ENCOUNTER — Encounter: Payer: Self-pay | Admitting: Physical Medicine & Rehabilitation

## 2017-05-14 VITALS — BP 166/69 | HR 60

## 2017-05-14 DIAGNOSIS — Z8249 Family history of ischemic heart disease and other diseases of the circulatory system: Secondary | ICD-10-CM | POA: Diagnosis not present

## 2017-05-14 DIAGNOSIS — I749 Embolism and thrombosis of unspecified artery: Secondary | ICD-10-CM | POA: Diagnosis present

## 2017-05-14 DIAGNOSIS — I4891 Unspecified atrial fibrillation: Secondary | ICD-10-CM | POA: Diagnosis not present

## 2017-05-14 DIAGNOSIS — I69354 Hemiplegia and hemiparesis following cerebral infarction affecting left non-dominant side: Secondary | ICD-10-CM | POA: Diagnosis not present

## 2017-05-14 DIAGNOSIS — M1712 Unilateral primary osteoarthritis, left knee: Secondary | ICD-10-CM | POA: Diagnosis not present

## 2017-05-14 DIAGNOSIS — I10 Essential (primary) hypertension: Secondary | ICD-10-CM | POA: Diagnosis not present

## 2017-05-14 DIAGNOSIS — Z90710 Acquired absence of both cervix and uterus: Secondary | ICD-10-CM | POA: Diagnosis not present

## 2017-05-14 NOTE — Telephone Encounter (Signed)
Reather LaurenceMike myers OT [facility] requesting verbal orders for OT for 2xwk X 2wks then 1xwk X 1wk, called back and verbal instructions given.

## 2017-05-14 NOTE — Telephone Encounter (Signed)
Called Mr. Izola PriceMyers back and he wanted to address an issue that has been ongoing for this patient as well, her blood pressure has been extremly inconsistant, some days systolic is down around 120-130 and up as high as 200+ systolic, please advise

## 2017-05-14 NOTE — Telephone Encounter (Signed)
I was told by patient's daughter yesterday that her meds are being manageed and recently adjusted by her PCP.  If there are persistent issues, PCP needs to be notified - I explained the same to the patient and daughter. Thanks.

## 2017-05-14 NOTE — Progress Notes (Signed)
Knee injection   Indication: Knee pain not relieved by medication management and other conservative care.  Informed consent was obtained after describing risks and benefits of the procedure with the patient, this includes bleeding, bruising, infection and medication side effects. The patient wishes to proceed and has given written consent. Patient was placed in a seated position. The left knee was marked and prepped with betadine in the anterolateral area. Vapocoolant spray was applied. A 22-gauge 2 inch needle was inserted into the joint space. Negative draw black for fluid. Syringe was replaced with medication. After negative draw back for blood, a solution containing 1 mL of 6 mg per ML celestone and 4 mL of 1% lidocaine was injected. A band aid was applied. The patient tolerated the procedure well. Post procedure instructions were given.

## 2017-05-14 NOTE — Telephone Encounter (Signed)
[facility] requesting verbal orders for 1 more visit tomorrow with the patient, verbal

## 2017-05-16 ENCOUNTER — Other Ambulatory Visit: Payer: Self-pay | Admitting: Family Medicine

## 2017-05-16 ENCOUNTER — Telehealth: Payer: Self-pay | Admitting: Licensed Clinical Social Worker

## 2017-05-16 ENCOUNTER — Telehealth: Payer: Self-pay | Admitting: *Deleted

## 2017-05-16 DIAGNOSIS — E782 Mixed hyperlipidemia: Secondary | ICD-10-CM

## 2017-05-16 DIAGNOSIS — E785 Hyperlipidemia, unspecified: Secondary | ICD-10-CM

## 2017-05-16 MED ORDER — SIMVASTATIN 40 MG PO TABS: 40.0000 mg | ORAL_TABLET | Freq: Every day | ORAL | 0 refills | Status: DC

## 2017-05-16 NOTE — Telephone Encounter (Signed)
[facility] called to report high BP readings, 190/90 sitting and 190/80 standing.  I called back and left message to call us so that I can tell her that per Dr Allena KatzPatel the PCP is managing the BP and medications. She did not have identifier on her voicemail so no patient information left.

## 2017-05-16 NOTE — Telephone Encounter (Signed)
LCSWA contacted pt's daughter, Christina [name], via telephone. LCSWA introduced self and [facility]. LCSWA disclosed that she received a consult from FNP Hairston to provide caregiver support services.   [name] denied need of in-home services for pt. She shared pt currently receives medication management, occupational, and physical therapy through [facility]. During call, [name] reported pt was having an initial Nursing Evaluation visit [facility].  [name] requests referral to [facility]. She is interested in programs for pt to participate in outside of the home. LCSWA informed her of available resources and agreed to send information via mail. No additional concerns noted.

## 2017-05-16 NOTE — Telephone Encounter (Signed)
I spoke with Chanetta MarshallKiera RN and gave her information from Dr Allena KatzPatel that BP management needs to be addressed with her PCP.  This was discussed at her appt 05/14/17.

## 2017-05-21 ENCOUNTER — Encounter (HOSPITAL_COMMUNITY): Payer: Self-pay | Admitting: Emergency Medicine

## 2017-05-21 ENCOUNTER — Telehealth: Payer: Self-pay

## 2017-05-21 DIAGNOSIS — Z79899 Other long term (current) drug therapy: Secondary | ICD-10-CM | POA: Insufficient documentation

## 2017-05-21 DIAGNOSIS — I10 Essential (primary) hypertension: Secondary | ICD-10-CM | POA: Diagnosis present

## 2017-05-21 DIAGNOSIS — Z8673 Personal history of transient ischemic attack (TIA), and cerebral infarction without residual deficits: Secondary | ICD-10-CM | POA: Insufficient documentation

## 2017-05-21 DIAGNOSIS — R4781 Slurred speech: Secondary | ICD-10-CM | POA: Insufficient documentation

## 2017-05-21 DIAGNOSIS — W1830XA Fall on same level, unspecified, initial encounter: Secondary | ICD-10-CM | POA: Diagnosis not present

## 2017-05-21 DIAGNOSIS — Z87891 Personal history of nicotine dependence: Secondary | ICD-10-CM | POA: Diagnosis not present

## 2017-05-21 DIAGNOSIS — Y929 Unspecified place or not applicable: Secondary | ICD-10-CM | POA: Diagnosis not present

## 2017-05-21 DIAGNOSIS — Y999 Unspecified external cause status: Secondary | ICD-10-CM | POA: Insufficient documentation

## 2017-05-21 DIAGNOSIS — N3000 Acute cystitis without hematuria: Secondary | ICD-10-CM | POA: Diagnosis not present

## 2017-05-21 DIAGNOSIS — M6281 Muscle weakness (generalized): Secondary | ICD-10-CM | POA: Insufficient documentation

## 2017-05-21 DIAGNOSIS — Y939 Activity, unspecified: Secondary | ICD-10-CM | POA: Diagnosis not present

## 2017-05-21 LAB — CBC
HEMATOCRIT: 35.5 % — AB (ref 36.0–46.0)
Hemoglobin: 11.4 g/dL — ABNORMAL LOW (ref 12.0–15.0)
MCH: 27.2 pg (ref 26.0–34.0)
MCHC: 32.1 g/dL (ref 30.0–36.0)
MCV: 84.7 fL (ref 78.0–100.0)
PLATELETS: 195 10*3/uL (ref 150–400)
RBC: 4.19 MIL/uL (ref 3.87–5.11)
RDW: 13.2 % (ref 11.5–15.5)
WBC: 6.2 10*3/uL (ref 4.0–10.5)

## 2017-05-21 LAB — CBG MONITORING, ED: Glucose-Capillary: 107 mg/dL — ABNORMAL HIGH (ref 65–99)

## 2017-05-21 NOTE — Telephone Encounter (Signed)
CMA call regarding results  Daughter stated that her mom was acting bad & violence And her BP was very high CMA advice to make a f/up appt asasp , daughter stated tahts he has an appt for BP check on 05/22/2017  Daughter was aware and understood

## 2017-05-21 NOTE — ED Notes (Signed)
Checked CBG 105, RN Autumn informed

## 2017-05-21 NOTE — Telephone Encounter (Signed)
-----   Message from Lizbeth BarkMandesia R Hairston, FNP sent at 05/16/2017 10:00 AM EDT ----- -Kidney function is stable since last check but is still decreased. Levels indicate you have chronic kidney disease stage stage 3. This can happen with advanced age. -Continue to take your medications for blood pressure, avoid taking NSAID medications, reduce salt intake to 2 to 4 grams/day, do not smoke. -Recommend follow up in 6 months. If your levels have signifcantly increased you will be referred to nephrology. -Lipid levels were elevated. This can increase your risk of heart disease. Your dose of simvastatin will be increased. Recommend follow up in 3 months.  -Urine culture showed negative.

## 2017-05-21 NOTE — ED Triage Notes (Signed)
Per patients daughter pt has had issues with HTN since being d/c from hospital, pt does have MD appt in am but BP tonight at home 214/95 at home, pt reports pts speech does not sound right and pt c/o R leg pain onset today.  Pt is A & O, neuro intact, pt c/o pain to RLE with ?bruise to R hip per daughter, pt does not recall falling.

## 2017-05-22 ENCOUNTER — Emergency Department (HOSPITAL_COMMUNITY): Payer: Medicare Other

## 2017-05-22 ENCOUNTER — Telehealth: Payer: Self-pay | Admitting: *Deleted

## 2017-05-22 ENCOUNTER — Emergency Department (HOSPITAL_COMMUNITY)
Admission: EM | Admit: 2017-05-22 | Discharge: 2017-05-22 | Disposition: A | Discharge status: Home / Self Care | Payer: Medicare Other | Attending: Emergency Medicine | Admitting: Emergency Medicine

## 2017-05-22 ENCOUNTER — Encounter: Payer: Self-pay | Admitting: Pharmacist

## 2017-05-22 DIAGNOSIS — W19XXXA Unspecified fall, initial encounter: Secondary | ICD-10-CM

## 2017-05-22 DIAGNOSIS — I10 Essential (primary) hypertension: Secondary | ICD-10-CM | POA: Diagnosis not present

## 2017-05-22 DIAGNOSIS — R29898 Other symptoms and signs involving the musculoskeletal system: Secondary | ICD-10-CM

## 2017-05-22 DIAGNOSIS — N3000 Acute cystitis without hematuria: Secondary | ICD-10-CM

## 2017-05-22 DIAGNOSIS — R4781 Slurred speech: Secondary | ICD-10-CM

## 2017-05-22 LAB — COMPREHENSIVE METABOLIC PANEL
ALT: 10 U/L — AB (ref 14–54)
AST: 16 U/L (ref 15–41)
Albumin: 3.6 g/dL (ref 3.5–5.0)
Alkaline Phosphatase: 52 U/L (ref 38–126)
Anion gap: 7 (ref 5–15)
BUN: 20 mg/dL (ref 6–20)
CO2: 26 mmol/L (ref 22–32)
Calcium: 9.3 mg/dL (ref 8.9–10.3)
Chloride: 106 mmol/L (ref 101–111)
Creatinine, Ser: 1.25 mg/dL — ABNORMAL HIGH (ref 0.44–1.00)
GFR calc non Af Amer: 43 mL/min — ABNORMAL LOW (ref >60)
GFR, EST AFRICAN AMERICAN: 49 mL/min — AB (ref >60)
Glucose, Bld: 134 mg/dL — ABNORMAL HIGH (ref 65–99)
Potassium: 3 mmol/L — ABNORMAL LOW (ref 3.5–5.1)
SODIUM: 139 mmol/L (ref 135–145)
Total Bilirubin: 0.6 mg/dL (ref 0.3–1.2)
Total Protein: 6.8 g/dL (ref 6.5–8.1)

## 2017-05-22 LAB — URINALYSIS, ROUTINE W REFLEX MICROSCOPIC
Bilirubin Urine: NEGATIVE
Glucose, UA: NEGATIVE mg/dL
Ketones, ur: NEGATIVE mg/dL
Nitrite: NEGATIVE
Protein, ur: 30 mg/dL — AB
Specific Gravity, Urine: 1.012 (ref 1.005–1.030)
pH: 6 (ref 5.0–8.0)

## 2017-05-22 MED ORDER — CARVEDILOL 12.5 MG PO TABS
25.0000 mg | ORAL_TABLET | Freq: Once | ORAL | Status: AC
  Administered 2017-05-22: 25 mg via ORAL
  Filled 2017-05-22: qty 2

## 2017-05-22 MED ORDER — CEPHALEXIN 500 MG PO CAPS: 1000.0000 mg | ORAL_CAPSULE | Freq: Two times a day (BID) | ORAL | 0 refills | Status: DC

## 2017-05-22 MED ORDER — VERAPAMIL HCL ER 180 MG PO TBCR
180.0000 mg | EXTENDED_RELEASE_TABLET | Freq: Once | ORAL | Status: AC
  Administered 2017-05-22: 180 mg via ORAL
  Filled 2017-05-22 (×2): qty 1

## 2017-05-22 NOTE — Telephone Encounter (Signed)
Chanetta MarshallKiera, RN, [facility] left message requesting verbal orders to extend [facility]4.  Verbal orders given

## 2017-05-22 NOTE — ED Notes (Signed)
Patient transported to MRI 

## 2017-05-22 NOTE — Discharge Instructions (Signed)
Return here as needed.  Follow-up with your primary care doctor °

## 2017-05-22 NOTE — ED Notes (Signed)
[facility]. Patient in MRI at time of handoff

## 2017-05-22 NOTE — ED Notes (Signed)
Pt c/o right hip pain worse with walking that family states began yesterday; wheelchair at bedside; pt denies known injury

## 2017-05-25 NOTE — ED Provider Notes (Signed)
[facility]-EMERGENCY DEPT Provider Note   CSN: [id number] Arrival date & time: 05/21/17  2131     History   Chief Complaint Chief Complaint  Patient presents with  . Hypertension    HPI Christina [name] is a 71 y.o. female.  HPI Patient presents to the emergency department with elevated blood pressure, along with right leg weakness and speech difficulties.  Her last night.  The daughter states that the symptoms seem to started yesterday.  She states that she had a stroke recently back in March and does have difficulty walking at this point and mainly uses a wheelchair.  She had a fall 2 days ago with right hip pain.  The patient denies chest pain, shortness of breath, headache,blurred vision, neck pain, fever, cough,  numbness, dizziness, anorexia, edema, abdominal pain, nausea, vomiting, diarrhea, rash, back pain, dysuria, hematemesis, bloody stool, near syncope, or syncope. Past Medical History:  Diagnosis Date  . Hypertension   . Stroke [facility])     Patient Active Problem List   Diagnosis Date Noted  . Encephalopathy, hypertensive 05/07/2017  . Enterococcus UTI 05/07/2017  . Hypertensive urgency 05/02/2017  . Hypertensive emergency 05/01/2017  . History of suicidal ideation   . Hypokalemia   . Suicide ideation   . Sleep disturbance   . PAF (paroxysmal atrial fibrillation) (HCC)   . Labile blood pressure   . AKI (acute kidney injury) (HCC)   . Stage 3 chronic kidney disease   . Acute blood loss anemia   . Benign essential HTN   . Adjustment disorder with mixed anxiety and depressed mood   . 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 Left hemiparesis (HCC)   . Primary osteoarthritis of left knee   . Left knee pain   . Diplopia   . Acute cystitis without hematuria   . Stroke (HCC)   . TIA (transient ischemic attack) 03/14/2017  . History of CVA (cerebrovascular accident) 03/14/2017  . HTN (hypertension) 03/14/2017  . HLD (hyperlipidemia) 03/14/2017  . 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 ABDOMINAL HYSTERECTOMY    . EYE SURGERY      OB History    No data available       Home Medications    Prior to Admission medications   Medication Sig Start Date End Date Taking? Authorizing Provider  acetaminophen (TYLENOL) 500 MG tablet Take 2 tablets (1,000 mg total) by mouth every 6 (six) hours as needed. 05/08/17  Yes Hairston, Oren Beckmann, FNP  apixaban (ELIQUIS) 5 MG TABS tablet Take 1 tablet (5 mg total) by mouth 2 (two) times daily. 05/08/17  Yes Hairston, Oren Beckmann, FNP  carvedilol (COREG) 25 MG tablet Take 1 tablet (25 mg total) by mouth 2 (two) times daily with a meal. 05/08/17  Yes Hairston, Mandesia R, FNP  diclofenac sodium (VOLTAREN) 1 % GEL Apply 2 g topically 4 (four) times daily as needed. 05/08/17  Yes Hairston, Oren Beckmann, FNP  FLUoxetine (PROZAC) 40 MG capsule Take 1 capsule (40 mg total) by mouth daily. 05/08/17  Yes Hairston, Oren Beckmann, FNP  hydrochlorothiazide (HYDRODIURIL) 25 MG tablet Take 1 tablet (25 mg total) by mouth daily. 04/04/17  Yes Angiulli, Mcarthur Rossetti, PA-C  latanoprost (XALATAN) 0.005 % ophthalmic solution Place 1 drop into both eyes every morning. 04/04/17  Yes Angiulli, Mcarthur Rossetti, PA-C  lidocaine (LIDODERM) 5 % Place 1 patch onto the skin daily. Remove & Discard patch within 12 hours or as  directed by MD 04/04/17  Yes Angiulli, Mcarthur Rossettianiel J, PA-C  losartan (COZAAR) 100 MG tablet Take 1 tablet (100 mg total) by mouth daily. 04/04/17  Yes Angiulli, Mcarthur Rossettianiel J, PA-C  mirtazapine (REMERON SOL-TAB) 15 MG disintegrating tablet Take 1 tablet (15 mg total) by mouth at bedtime. 05/08/17  Yes Hairston, Oren BeckmannMandesia R, FNP  potassium chloride SA (K-DUR,KLOR-CON) 20 MEQ tablet Take 1 tablet (20 mEq total) by mouth daily. 04/04/17  Yes Angiulli, Mcarthur Rossettianiel J, PA-C  senna-docusate (SENOKOT-S) 8.6-50 MG tablet Take 1 tablet by mouth at bedtime as needed for moderate constipation. 03/19/17  Yes Richarda OverlieAbrol, Nayana, MD    simvastatin (ZOCOR) 40 MG tablet Take 1 tablet (40 mg total) by mouth daily. 05/16/17  Yes Hairston, Oren BeckmannMandesia R, FNP  traMADol (ULTRAM) 50 MG tablet Take 2 tablets (100 mg total) by mouth every 12 (twelve) hours as needed for moderate pain. 04/04/17  Yes Angiulli, Mcarthur Rossettianiel J, PA-C  verapamil (CALAN-SR) 180 MG CR tablet Take 1 tablet (180 mg total) by mouth 2 (two) times daily. 05/08/17  Yes Hairston, Oren BeckmannMandesia R, FNP  cephALEXin (KEFLEX) 500 MG capsule Take 2 capsules (1,000 mg total) by mouth 2 (two) times daily. 05/22/17   Meggan Dhaliwal, Cristal Deerhristopher, PA-C  nitrofurantoin, macrocrystal-monohydrate, (MACROBID) 100 MG capsule Take 1 capsule (100 mg total) by mouth 2 (two) times daily. Patient not taking: Reported on 05/22/2017 05/08/17   Lizbeth BarkHairston, Mandesia R, FNP    Family History Family History  Problem Relation Age of Onset  . Heart disease Mother   . Diabetes Mother   . Heart disease Father   . Heart disease Sister   . Heart disease Sister     Social History Social History  Substance Use Topics  . Smoking status: Former Games developermoker  . Smokeless tobacco: Never Used  . Alcohol use No     Allergies   Patient has no known allergies.   Review of Systems Review of Systems All other systems negative except as documented in the HPI. All pertinent positives and negatives as reviewed in the HPI.  Physical Exam Updated Vital Signs BP (!) 181/78   Pulse 66   Temp 98.2 F (36.8 C) (Oral)   Resp 12   Ht 5\' 3"  (1.6 m)   Wt 62.1 kg (137 lb)   SpO2 100%   BMI 24.27 kg/m   Physical Exam  Constitutional: She is oriented to person, place, and time. She appears well-developed and well-nourished. No distress.  HENT:  Head: Normocephalic and atraumatic.  Mouth/Throat: Oropharynx is clear and moist.  Eyes: Pupils are equal, round, and reactive to light.  Neck: Normal range of motion. Neck supple.  Cardiovascular: Normal rate, regular rhythm and normal heart sounds.  Exam reveals no gallop and no  friction rub.   No murmur heard. Pulmonary/Chest: Effort normal and breath sounds normal. No respiratory distress. She has no wheezes.  Abdominal: Soft. Bowel sounds are normal. She exhibits no distension. There is no tenderness.  Musculoskeletal:       Right hip: She exhibits decreased range of motion and tenderness. She exhibits normal strength.  Neurological: She is alert and oriented to person, place, and time. She displays normal reflexes. No sensory deficit. She exhibits normal muscle tone. Coordination normal.  Skin: Skin is warm and dry. Capillary refill takes less than 2 seconds. No rash noted. No erythema.  Psychiatric: She has a normal mood and affect. Her behavior is normal.  Nursing note and vitals reviewed.    ED Treatments / Results  Labs (all labs ordered are listed, but only abnormal results are displayed) Labs Reviewed  CBC - Abnormal; Notable for the following:       Result Value   Hemoglobin 11.4 (*)    HCT 35.5 (*)    All other components within normal limits  COMPREHENSIVE METABOLIC PANEL - Abnormal; Notable for the following:    Potassium 3.0 (*)    Glucose, Bld 134 (*)    Creatinine, Ser 1.25 (*)    ALT 10 (*)    GFR calc non Af Amer 43 (*)    GFR calc Af Amer 49 (*)    All other components within normal limits  URINALYSIS, ROUTINE W REFLEX MICROSCOPIC - Abnormal; Notable for the following:    APPearance CLOUDY (*)    Hgb urine dipstick SMALL (*)    Protein, ur 30 (*)    Leukocytes, UA LARGE (*)    Bacteria, UA MANY (*)    Squamous Epithelial / LPF 6-30 (*)    Non Squamous Epithelial 0-5 (*)    All other components within normal limits  CBG MONITORING, ED - Abnormal; Notable for the following:    Glucose-Capillary 107 (*)    All other components within normal limits    EKG  EKG Interpretation  Date/Time:  Wednesday May 21 2017 21:56:39 EDT Ventricular Rate:  59 PR Interval:  152 QRS Duration: 86 QT Interval:  458 QTC Calculation: 453 R  Axis:   67 Text Interpretation:  Sinus bradycardia Minimal voltage criteria for LVH, may be normal variant Borderline ECG Confirmed by Vanetta Mulders [phone]) on 05/22/2017 11:19:37 AM       Radiology No results found.  Procedures Procedures (including critical care time)  Medications Ordered in ED Medications  carvedilol (COREG) tablet 25 mg (25 mg Oral Given 05/22/17 1112)  verapamil (CALAN-SR) CR tablet 180 mg (180 mg Oral Given 05/22/17 1203)     Initial Impression / Assessment and Plan / ED Course  I have reviewed the triage vital signs and the nursing notes.  Pertinent labs & imaging results that were available during my care of the patient were reviewed by me and considered in my medical decision making (see chart for details).     Patient does have some weakness in her legs.  I did order an MR to make sure she has not recently stroked.  The patient does not have any hip related abnormalities noted on plain films.  The patient normally does not ambulate only uses a wheelchair.   Final Clinical Impressions(s) / ED Diagnoses   Final diagnoses:  Essential hypertension  Fall, initial encounter  Weakness of right lower extremity  Slurred speech  Acute cystitis without hematuria    New Prescriptions Discharge Medication List as of 05/22/2017 10:44 AM       Charlestine Night, PA-C 05/25/17 1645    Pricilla Loveless, MD 05/29/17 [phone]

## 2017-05-26 ENCOUNTER — Other Ambulatory Visit: Payer: Self-pay | Admitting: Pharmacist

## 2017-05-26 MED ORDER — VOLTAREN 1 % TD GEL: 2.0000 g | Freq: Four times a day (QID) | TRANSDERMAL | 1 refills | Status: DC

## 2017-05-27 ENCOUNTER — Encounter (HOSPITAL_COMMUNITY): Payer: Self-pay | Admitting: *Deleted

## 2017-05-27 ENCOUNTER — Telehealth: Payer: Self-pay | Admitting: *Deleted

## 2017-05-27 ENCOUNTER — Emergency Department (HOSPITAL_COMMUNITY)
Admission: EM | Admit: 2017-05-27 | Discharge: 2017-06-01 | Disposition: A | Discharge status: Home / Self Care | Payer: Medicare Other | Attending: Emergency Medicine | Admitting: Emergency Medicine

## 2017-05-27 ENCOUNTER — Emergency Department (HOSPITAL_COMMUNITY): Payer: Medicare Other

## 2017-05-27 DIAGNOSIS — I10 Essential (primary) hypertension: Secondary | ICD-10-CM | POA: Insufficient documentation

## 2017-05-27 DIAGNOSIS — F329 Major depressive disorder, single episode, unspecified: Secondary | ICD-10-CM | POA: Insufficient documentation

## 2017-05-27 DIAGNOSIS — Z7901 Long term (current) use of anticoagulants: Secondary | ICD-10-CM | POA: Insufficient documentation

## 2017-05-27 DIAGNOSIS — Y999 Unspecified external cause status: Secondary | ICD-10-CM | POA: Diagnosis not present

## 2017-05-27 DIAGNOSIS — S7011XA Contusion of right thigh, initial encounter: Secondary | ICD-10-CM | POA: Diagnosis not present

## 2017-05-27 DIAGNOSIS — Z87891 Personal history of nicotine dependence: Secondary | ICD-10-CM | POA: Diagnosis not present

## 2017-05-27 DIAGNOSIS — S7012XA Contusion of left thigh, initial encounter: Secondary | ICD-10-CM | POA: Diagnosis not present

## 2017-05-27 DIAGNOSIS — X838XXA Intentional self-harm by other specified means, initial encounter: Secondary | ICD-10-CM | POA: Insufficient documentation

## 2017-05-27 DIAGNOSIS — Y939 Activity, unspecified: Secondary | ICD-10-CM | POA: Diagnosis not present

## 2017-05-27 DIAGNOSIS — Z8673 Personal history of transient ischemic attack (TIA), and cerebral infarction without residual deficits: Secondary | ICD-10-CM | POA: Insufficient documentation

## 2017-05-27 DIAGNOSIS — Z79899 Other long term (current) drug therapy: Secondary | ICD-10-CM | POA: Diagnosis not present

## 2017-05-27 DIAGNOSIS — R45851 Suicidal ideations: Secondary | ICD-10-CM

## 2017-05-27 DIAGNOSIS — Y929 Unspecified place or not applicable: Secondary | ICD-10-CM | POA: Insufficient documentation

## 2017-05-27 DIAGNOSIS — T1491XA Suicide attempt, initial encounter: Secondary | ICD-10-CM | POA: Diagnosis not present

## 2017-05-27 DIAGNOSIS — S79921A Unspecified injury of right thigh, initial encounter: Secondary | ICD-10-CM | POA: Diagnosis present

## 2017-05-27 LAB — URINALYSIS, ROUTINE W REFLEX MICROSCOPIC
Bacteria, UA: NONE SEEN
Bilirubin Urine: NEGATIVE
GLUCOSE, UA: NEGATIVE mg/dL
Hgb urine dipstick: NEGATIVE
Ketones, ur: NEGATIVE mg/dL
Nitrite: NEGATIVE
PH: 7 (ref 5.0–8.0)
PROTEIN: NEGATIVE mg/dL
SPECIFIC GRAVITY, URINE: 1.01 (ref 1.005–1.030)

## 2017-05-27 LAB — CBC
HEMATOCRIT: 37.1 % (ref 36.0–46.0)
Hemoglobin: 12.1 g/dL (ref 12.0–15.0)
MCH: 27.3 pg (ref 26.0–34.0)
MCHC: 32.6 g/dL (ref 30.0–36.0)
MCV: 83.6 fL (ref 78.0–100.0)
Platelets: 221 10*3/uL (ref 150–400)
RBC: 4.44 MIL/uL (ref 3.87–5.11)
RDW: 12.7 % (ref 11.5–15.5)
WBC: 4.9 10*3/uL (ref 4.0–10.5)

## 2017-05-27 LAB — COMPREHENSIVE METABOLIC PANEL
ALBUMIN: 3.8 g/dL (ref 3.5–5.0)
ALT: 12 U/L — AB (ref 14–54)
AST: 16 U/L (ref 15–41)
Alkaline Phosphatase: 61 U/L (ref 38–126)
Anion gap: 10 (ref 5–15)
BUN: 16 mg/dL (ref 6–20)
CHLORIDE: 105 mmol/L (ref 101–111)
CO2: 24 mmol/L (ref 22–32)
CREATININE: 1.34 mg/dL — AB (ref 0.44–1.00)
Calcium: 9.7 mg/dL (ref 8.9–10.3)
GFR calc Af Amer: 45 mL/min — ABNORMAL LOW (ref >60)
GFR calc non Af Amer: 39 mL/min — ABNORMAL LOW (ref >60)
Glucose, Bld: 102 mg/dL — ABNORMAL HIGH (ref 65–99)
POTASSIUM: 3.6 mmol/L (ref 3.5–5.1)
Sodium: 139 mmol/L (ref 135–145)
Total Bilirubin: 0.9 mg/dL (ref 0.3–1.2)
Total Protein: 7.5 g/dL (ref 6.5–8.1)

## 2017-05-27 LAB — RAPID URINE DRUG SCREEN, HOSP PERFORMED
AMPHETAMINES: NOT DETECTED
BARBITURATES: NOT DETECTED
BENZODIAZEPINES: NOT DETECTED
Cocaine: NOT DETECTED
Opiates: NOT DETECTED
Tetrahydrocannabinol: NOT DETECTED

## 2017-05-27 LAB — SALICYLATE LEVEL: Salicylate Lvl: <7 mg/dL (ref 2.8–30.0)

## 2017-05-27 LAB — ETHANOL: Alcohol, Ethyl (B): <5 mg/dL (ref <5)

## 2017-05-27 LAB — ACETAMINOPHEN LEVEL: Acetaminophen (Tylenol), Serum: <10 ug/mL — ABNORMAL LOW (ref 10–30)

## 2017-05-27 MED ORDER — VERAPAMIL HCL ER 180 MG PO TBCR: 180.0000 mg | EXTENDED_RELEASE_TABLET | Freq: Two times a day (BID) | ORAL | Status: DC

## 2017-05-27 MED ORDER — POTASSIUM CHLORIDE CRYS ER 20 MEQ PO TBCR
20.0000 meq | EXTENDED_RELEASE_TABLET | Freq: Every day | ORAL | Status: DC
  Administered 2017-05-27 – 2017-06-01 (×6): 20 meq via ORAL
  Filled 2017-05-27 (×6): qty 1

## 2017-05-27 MED ORDER — LOSARTAN POTASSIUM 50 MG PO TABS: 100.0000 mg | ORAL_TABLET | Freq: Every day | ORAL | Status: DC

## 2017-05-27 MED ORDER — APIXABAN 5 MG PO TABS
5.0000 mg | ORAL_TABLET | Freq: Two times a day (BID) | ORAL | Status: DC
Start: 2017-05-27 — End: 2017-06-01
  Administered 2017-05-27 – 2017-06-01 (×9): 5 mg via ORAL
  Filled 2017-05-27 (×9): qty 1

## 2017-05-27 MED ORDER — CEPHALEXIN 250 MG PO CAPS
1000.0000 mg | ORAL_CAPSULE | Freq: Two times a day (BID) | ORAL | Status: DC
  Administered 2017-05-27 – 2017-06-01 (×10): 1000 mg via ORAL
  Filled 2017-05-27 (×11): qty 4

## 2017-05-27 MED ORDER — FLUOXETINE HCL 20 MG PO CAPS
40.0000 mg | ORAL_CAPSULE | Freq: Every day | ORAL | Status: DC
  Administered 2017-05-28 – 2017-06-01 (×5): 40 mg via ORAL
  Filled 2017-05-27 (×6): qty 2

## 2017-05-27 MED ORDER — LOSARTAN POTASSIUM 50 MG PO TABS
100.0000 mg | ORAL_TABLET | Freq: Every day | ORAL | Status: DC
  Administered 2017-05-28 – 2017-06-01 (×5): 100 mg via ORAL
  Filled 2017-05-27 (×6): qty 2

## 2017-05-27 MED ORDER — SODIUM CHLORIDE 0.9 % IV BOLUS (SEPSIS): 1000.0000 mL | Freq: Once | INTRAVENOUS | Status: DC

## 2017-05-27 MED ORDER — FLUOXETINE HCL 40 MG PO CAPS
40.0000 mg | ORAL_CAPSULE | Freq: Every day | ORAL | Status: DC
Start: 2017-05-27 — End: 2017-05-27

## 2017-05-27 MED ORDER — HYDROCHLOROTHIAZIDE 25 MG PO TABS
25.0000 mg | ORAL_TABLET | Freq: Every day | ORAL | Status: DC
  Administered 2017-05-27 – 2017-06-01 (×6): 25 mg via ORAL
  Filled 2017-05-27 (×7): qty 1

## 2017-05-27 MED ORDER — VERAPAMIL HCL ER 180 MG PO TBCR
180.0000 mg | EXTENDED_RELEASE_TABLET | Freq: Two times a day (BID) | ORAL | Status: DC
  Administered 2017-05-28 – 2017-06-01 (×9): 180 mg via ORAL
  Filled 2017-05-27 (×10): qty 1

## 2017-05-27 MED ORDER — ACETAMINOPHEN 325 MG PO TABS
650.0000 mg | ORAL_TABLET | ORAL | Status: DC | PRN
  Administered 2017-05-27 – 2017-05-31 (×5): 650 mg via ORAL
  Filled 2017-05-27 (×5): qty 2

## 2017-05-27 MED ORDER — CEFTRIAXONE SODIUM 1 G IJ SOLR: 1.0000 g | Freq: Once | INTRAMUSCULAR | Status: DC

## 2017-05-27 MED ORDER — CARVEDILOL 12.5 MG PO TABS
25.0000 mg | ORAL_TABLET | Freq: Two times a day (BID) | ORAL | Status: DC
  Administered 2017-05-27 – 2017-06-01 (×10): 25 mg via ORAL
  Filled 2017-05-27 (×11): qty 2

## 2017-05-27 MED ORDER — CARVEDILOL 12.5 MG PO TABS: 25.0000 mg | ORAL_TABLET | Freq: Two times a day (BID) | ORAL | Status: DC

## 2017-05-27 NOTE — ED Notes (Addendum)
Telepsych at bedside speaking to PT

## 2017-05-27 NOTE — ED Notes (Signed)
Farrel Gordonynthia Hildago, Daughter, phone number [phone]
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
## 2017-05-27 NOTE — ED Notes (Signed)
Pt assisted w/lying on bed from sitting as requested.
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
## 2017-05-27 NOTE — ED Notes (Signed)
Aram BeechamCynthia, daughter, called - updated on tx plan - inpt. In agreement w/tx plan.

## 2017-05-27 NOTE — ED Notes (Signed)
Pt ambulating to bathroom - pt takes a step or 2 then talks w/staff - states "Don't feel sorry for me. I was walking normal this morning and now look at me. I have to use this walker."

## 2017-05-27 NOTE — Telephone Encounter (Signed)
Thank you :)

## 2017-05-27 NOTE — Telephone Encounter (Signed)
[name] called to report Christina [name] missed a PT visit last week and he was requesting to get an additional visit to make up for the missed visit.  She went to the ED last week with BP issues.  He has not been able to reach her or her daughter.  I opened chart and Ms Christina [name] is in the ED currently and most likely will be admitted. I will notifiy Dr Allena KatzPatel.  I left [name] a VM putting PT orders on hold until more is known about status.

## 2017-05-27 NOTE — ED Notes (Signed)
Called pharmacy for meds

## 2017-05-27 NOTE — ED Notes (Signed)
Pt drinking coffee given as requested.

## 2017-05-27 NOTE — ED Notes (Signed)
Sitter at bedside. PT taken a meal

## 2017-05-27 NOTE — ED Notes (Signed)
Transported to x-ray via w/c w/Sitter.
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
## 2017-05-27 NOTE — ED Notes (Signed)
A Regular Diet was ordered for Dinner. 

## 2017-05-27 NOTE — ED Notes (Signed)
Becky RN accepts report at this time.

## 2017-05-27 NOTE — ED Notes (Signed)
PT reports she takes some BP meds 3 times per day, per med rec she takes them twice per day. BP has normalized. Order cancelled verbally by Dr. Rosalia Hammersay
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
## 2017-05-27 NOTE — ED Notes (Signed)
Returned from x-ray via w/c w/Sitter. 

## 2017-05-28 DIAGNOSIS — S7011XA Contusion of right thigh, initial encounter: Secondary | ICD-10-CM | POA: Diagnosis not present

## 2017-05-28 NOTE — ED Notes (Signed)
A cup of water and cookies placed on patient bedside table for snack.

## 2017-05-28 NOTE — ED Notes (Signed)
Patient was given a cup of water, and A Regular diet was ordered for Dinner.

## 2017-05-28 NOTE — Progress Notes (Signed)
CSW followed up on the following gero psych placement facilities for possible placement:  Per Melissa at [facility], unsure if referral was received and will review intake packets and call back if a bed is available.   [facility], pt has been declined due to hx of stroke  Per [facility], did not receive the referral but states they are currently at capacity for gero beds.   Per Cord[facility] PenSherry [facility], currently accepting gero-psych patients. Referral to be faxed.  Princess BruinsAquicha Ariyan Sinnett, MSW, LCSWA CSW Disposition [phone]

## 2017-05-29 ENCOUNTER — Ambulatory Visit: Payer: Self-pay | Admitting: Nurse Practitioner

## 2017-05-29 DIAGNOSIS — I69354 Hemiplegia and hemiparesis following cerebral infarction affecting left non-dominant side: Secondary | ICD-10-CM

## 2017-05-29 DIAGNOSIS — Z87891 Personal history of nicotine dependence: Secondary | ICD-10-CM

## 2017-05-29 DIAGNOSIS — I161 Hypertensive emergency: Secondary | ICD-10-CM

## 2017-05-29 DIAGNOSIS — N390 Urinary tract infection, site not specified: Secondary | ICD-10-CM

## 2017-05-29 DIAGNOSIS — I674 Hypertensive encephalopathy: Secondary | ICD-10-CM

## 2017-05-29 DIAGNOSIS — R45851 Suicidal ideations: Secondary | ICD-10-CM

## 2017-05-29 DIAGNOSIS — B952 Enterococcus as the cause of diseases classified elsewhere: Secondary | ICD-10-CM

## 2017-05-29 DIAGNOSIS — S7011XA Contusion of right thigh, initial encounter: Secondary | ICD-10-CM | POA: Diagnosis not present

## 2017-05-29 MED ORDER — LORAZEPAM 1 MG PO TABS
2.0000 mg | ORAL_TABLET | Freq: Once | ORAL | Status: AC
  Administered 2017-05-29: 2 mg via ORAL
  Filled 2017-05-29: qty 2

## 2017-05-29 NOTE — ED Notes (Signed)
Pt upset, trying to leave, yelling. Attempting to wrap things around her neck in an attempt to hang herself. Sitter and this RN with pt at all times.  Pt attempting this behavior with staff in the room and when staff was in the restroom with the pt.  Environment secured.  EDP notified and orders received.

## 2017-05-29 NOTE — ED Notes (Signed)
Patient was given a snack and drink, and A Regular Diet was ordered for Lunch. 
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
## 2017-05-29 NOTE — ED Notes (Signed)
A Regular Diet was ordered for dinner.

## 2017-05-29 NOTE — ED Notes (Signed)
Patient was taken to toilet in a wheelchair, with assist to toilet, and Sitter was at standby until patient  finshed  Using toilet.

## 2017-05-29 NOTE — ED Notes (Signed)
Patient alert/talking but cooperative. Environment secured.

## 2017-05-29 NOTE — ED Notes (Signed)
TTS being completed. 

## 2017-05-30 ENCOUNTER — Encounter: Payer: Medicare Other | Admitting: Physical Medicine & Rehabilitation

## 2017-05-30 DIAGNOSIS — S7011XA Contusion of right thigh, initial encounter: Secondary | ICD-10-CM | POA: Diagnosis not present

## 2017-05-30 NOTE — ED Notes (Signed)
TTS at bedside. 

## 2017-05-30 NOTE — ED Notes (Signed)
Pt. Did not eat breakfast and will not eat lunch. Sitter keeps encouraging patient to eat. Pt. States she is not hungry.
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
## 2017-05-30 NOTE — ED Notes (Signed)
Pt meal tray delivered and at bedside  

## 2017-05-30 NOTE — ED Notes (Signed)
Pt. Tearful when daughter was here. Pt. States that [place] wants to go back [place] and that no one cares about her here.

## 2017-05-31 DIAGNOSIS — S7011XA Contusion of right thigh, initial encounter: Secondary | ICD-10-CM | POA: Diagnosis not present

## 2017-05-31 NOTE — ED Notes (Signed)
Ice pack applied to left knee for comfort - verbalized understanding to leave on x 15-20 minutes.

## 2017-05-31 NOTE — ED Notes (Signed)
Sitter assisting pt w/bathing in room.

## 2017-05-31 NOTE — BH Assessment (Signed)
Calls to referral sites from 6/5 produced the following results  [place]: At capacity with small possibility of 1 female bad on 6/10 [place] [place] [place]  [facility]  No answer (2 attempts) [facility] : No [facility] [place]: [facility] openings [facility]: Openings; [facility]; resending referral [place]: Possible opening 6/10; resending   Christina Bernatherine C Harrill, LCSW

## 2017-05-31 NOTE — ED Notes (Signed)
Dr Ethelda ChickJacubowitz in w/pt and daughter. Pt c/o left knee pain flaring - Pt in agreement to take Tylenol.

## 2017-05-31 NOTE — BH Assessment (Signed)
[facility] [place] [facility] reports they may be able to accept patient yet alerted that pt would be self pay as her Medicare Part B only would not cover her stay. Cost is approximately $1300/day.  CSW contacted pt's RN Lurena JoinerRebecca who suggested call to daughter, Aram BeechamCynthia at [phone]. No answer and did not leave message as Clinical research associatewriter is about to leave. Daughter redialed number immediately and we spoke. Daughter reports that self pay would be a hardship and she hopes mother will be discharged home. Daughter did mention patient has UTS and she is aware that that can influence demeanor.  Carney Bernatherine C Harrill, LCSW

## 2017-05-31 NOTE — ED Notes (Signed)
Daughter leaving at this time. States she feels her mother is becoming weaker by being in hospital and wants to bring her home. Advised her will encourage pt to ambulate today and will discuss w/[facility]. [facility] to meet in[facility] criteria, had Telepsych performed on 05/29/17, continuing to seek inpt. RN will advise daughter.

## 2017-05-31 NOTE — ED Provider Notes (Addendum)
Patient resting comfortably except complains of left knee pain which she suffers from chronically, secondary to arthritis. Tylenol ordered. On exam appears in no distress. Left lower extremity not red warm or swollen. Tylenol ordered. Urinalysis noted. She denies any urinary symptoms.   Doug SouJacubowitz, Alaa Mullally, MD 05/31/17 [phone]    Doug SouJacubowitz, Aryaan Persichetti, MD 05/31/17 [phone]

## 2017-05-31 NOTE — ED Notes (Signed)
Daughter visiting w/pt.

## 2017-05-31 NOTE — ED Notes (Signed)
Pt noted w/bedside commode in room. Requested for Sitter to assist her w/it rather than ambulating to bathroom d/t c/o knee pain.
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
## 2017-06-01 NOTE — ED Notes (Signed)
Daughter visited w/pt. States pt has Medicare Part B and Medicaid. States she will return at 1230.

## 2017-06-01 NOTE — ED Notes (Signed)
Pt's daughter aware of delay w/d/c paperwork. States she is OK - "just happy she's coming home".

## 2017-06-01 NOTE — Progress Notes (Signed)
Per Christina Ruffina Okonkwo, NP, pt is no longer in need of inpatient treatment and may be d/c home.  [facility] Nurse, Kriste BasqueBecky notified.  Christina EulerJean T. Christina [name], MSW, LCSWA Clinical Social Work Disposition [phone]

## 2017-06-01 NOTE — ED Notes (Signed)
Pt's daughter and son-in-law have arrived to visit w/pt. Advised daughter of tx plan - d/c to home. Pt and family voice agreement w/tx plan. Daughter states she is excited for her mother to return home. Pt denies SI/HI - states will sign "No Harm Contract". [facility] Dere[facility]ackowski [facility].

## 2017-06-01 NOTE — Discharge Instructions (Signed)
Follow-up as per behavioral health. They can point to follow-up with your doctor as well. Return for any new or worse symptoms.

## 2017-06-01 NOTE — ED Notes (Signed)
Charge RN aware no Systems developeritter available from Staffing.

## 2017-06-01 NOTE — ED Notes (Signed)
Pt pressed call bell asking for RN to lower head of bed - done.
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
## 2017-06-04 ENCOUNTER — Inpatient Hospital Stay: Payer: Self-pay

## 2017-06-04 ENCOUNTER — Encounter (HOSPITAL_COMMUNITY): Payer: Self-pay | Admitting: *Deleted

## 2017-06-04 ENCOUNTER — Inpatient Hospital Stay (HOSPITAL_COMMUNITY): Payer: Medicare Other

## 2017-06-04 ENCOUNTER — Inpatient Hospital Stay (HOSPITAL_COMMUNITY)
Admission: EM | Admit: 2017-06-04 | Discharge: 2017-06-06 | DRG: 065 | Disposition: A | Discharge status: Rehabilitation Facility | Payer: Medicare Other | Attending: Internal Medicine | Admitting: Internal Medicine

## 2017-06-04 ENCOUNTER — Encounter: Payer: Self-pay | Admitting: Physician Assistant

## 2017-06-04 ENCOUNTER — Ambulatory Visit (HOSPITAL_BASED_OUTPATIENT_CLINIC_OR_DEPARTMENT_OTHER): Payer: Medicare Other | Admitting: Physician Assistant

## 2017-06-04 ENCOUNTER — Emergency Department (HOSPITAL_COMMUNITY): Payer: Medicare Other

## 2017-06-04 VITALS — BP 171/83 | HR 92 | Temp 98.6°F | Resp 16

## 2017-06-04 DIAGNOSIS — F329 Major depressive disorder, single episode, unspecified: Secondary | ICD-10-CM | POA: Diagnosis present

## 2017-06-04 DIAGNOSIS — F32A Depression, unspecified: Secondary | ICD-10-CM | POA: Diagnosis present

## 2017-06-04 DIAGNOSIS — R4701 Aphasia: Secondary | ICD-10-CM | POA: Diagnosis present

## 2017-06-04 DIAGNOSIS — Z8673 Personal history of transient ischemic attack (TIA), and cerebral infarction without residual deficits: Secondary | ICD-10-CM | POA: Diagnosis not present

## 2017-06-04 DIAGNOSIS — I10 Essential (primary) hypertension: Secondary | ICD-10-CM | POA: Diagnosis not present

## 2017-06-04 DIAGNOSIS — E876 Hypokalemia: Secondary | ICD-10-CM | POA: Diagnosis present

## 2017-06-04 DIAGNOSIS — I480 Paroxysmal atrial fibrillation: Secondary | ICD-10-CM | POA: Diagnosis present

## 2017-06-04 DIAGNOSIS — N179 Acute kidney failure, unspecified: Secondary | ICD-10-CM | POA: Diagnosis present

## 2017-06-04 DIAGNOSIS — S0083XA Contusion of other part of head, initial encounter: Secondary | ICD-10-CM

## 2017-06-04 DIAGNOSIS — Z7901 Long term (current) use of anticoagulants: Secondary | ICD-10-CM

## 2017-06-04 DIAGNOSIS — I638 Other cerebral infarction: Principal | ICD-10-CM | POA: Diagnosis present

## 2017-06-04 DIAGNOSIS — Z79899 Other long term (current) drug therapy: Secondary | ICD-10-CM | POA: Diagnosis not present

## 2017-06-04 DIAGNOSIS — Y92009 Unspecified place in unspecified non-institutional (private) residence as the place of occurrence of the external cause: Secondary | ICD-10-CM | POA: Diagnosis not present

## 2017-06-04 DIAGNOSIS — W19XXXA Unspecified fall, initial encounter: Secondary | ICD-10-CM

## 2017-06-04 DIAGNOSIS — I749 Embolism and thrombosis of unspecified artery: Secondary | ICD-10-CM

## 2017-06-04 DIAGNOSIS — I639 Cerebral infarction, unspecified: Secondary | ICD-10-CM | POA: Insufficient documentation

## 2017-06-04 DIAGNOSIS — N183 Chronic kidney disease, stage 3 unspecified: Secondary | ICD-10-CM | POA: Diagnosis present

## 2017-06-04 DIAGNOSIS — R4182 Altered mental status, unspecified: Secondary | ICD-10-CM | POA: Diagnosis not present

## 2017-06-04 HISTORY — DX: Paroxysmal atrial fibrillation: I48.0

## 2017-06-04 LAB — CBC WITH DIFFERENTIAL/PLATELET
Basophils Absolute: 0 10*3/uL (ref 0.0–0.1)
Basophils Relative: 0 %
EOS PCT: 3 %
Eosinophils Absolute: 0.2 10*3/uL (ref 0.0–0.7)
HEMATOCRIT: 38.6 % (ref 36.0–46.0)
Hemoglobin: 12.6 g/dL (ref 12.0–15.0)
LYMPHS ABS: 1 10*3/uL (ref 0.7–4.0)
Lymphocytes Relative: 15 %
MCH: 27 pg (ref 26.0–34.0)
MCHC: 32.6 g/dL (ref 30.0–36.0)
MCV: 82.8 fL (ref 78.0–100.0)
MONO ABS: 0.3 10*3/uL (ref 0.1–1.0)
MONOS PCT: 4 %
NEUTROS ABS: 5.4 10*3/uL (ref 1.7–7.7)
Neutrophils Relative %: 78 %
Platelets: 218 10*3/uL (ref 150–400)
RBC: 4.66 MIL/uL (ref 3.87–5.11)
RDW: 12.9 % (ref 11.5–15.5)
WBC: 6.9 10*3/uL (ref 4.0–10.5)

## 2017-06-04 LAB — IRON AND TIBC
IRON: 33 ug/dL (ref 28–170)
Saturation Ratios: 10 % — ABNORMAL LOW (ref 10.4–31.8)
TIBC: 328 ug/dL (ref 250–450)
UIBC: 295 ug/dL

## 2017-06-04 LAB — COMPREHENSIVE METABOLIC PANEL
ALT: 12 U/L — ABNORMAL LOW (ref 14–54)
AST: 18 U/L (ref 15–41)
Albumin: 4 g/dL (ref 3.5–5.0)
Alkaline Phosphatase: 60 U/L (ref 38–126)
Anion gap: 10 (ref 5–15)
BILIRUBIN TOTAL: 0.5 mg/dL (ref 0.3–1.2)
BUN: 22 mg/dL — AB (ref 6–20)
CO2: 23 mmol/L (ref 22–32)
Calcium: 10 mg/dL (ref 8.9–10.3)
Chloride: 103 mmol/L (ref 101–111)
Creatinine, Ser: 1.24 mg/dL — ABNORMAL HIGH (ref 0.44–1.00)
GFR, EST AFRICAN AMERICAN: 50 mL/min — AB (ref >60)
GFR, EST NON AFRICAN AMERICAN: 43 mL/min — AB (ref >60)
Glucose, Bld: 114 mg/dL — ABNORMAL HIGH (ref 65–99)
POTASSIUM: 3.9 mmol/L (ref 3.5–5.1)
Sodium: 136 mmol/L (ref 135–145)
Total Protein: 7.6 g/dL (ref 6.5–8.1)

## 2017-06-04 LAB — TSH: TSH: 1.623 u[IU]/mL (ref 0.350–4.500)

## 2017-06-04 LAB — FERRITIN: FERRITIN: 62 ng/mL (ref 11–307)

## 2017-06-04 LAB — FOLATE: Folate: 14.2 ng/mL (ref >5.9)

## 2017-06-04 LAB — MAGNESIUM: MAGNESIUM: 1.6 mg/dL — AB (ref 1.7–2.4)

## 2017-06-04 LAB — AMMONIA: AMMONIA: 22 umol/L (ref 9–35)

## 2017-06-04 LAB — CBG MONITORING, ED: Glucose-Capillary: 111 mg/dL — ABNORMAL HIGH (ref 65–99)

## 2017-06-04 LAB — RETICULOCYTES
RBC.: 4.25 MIL/uL (ref 3.87–5.11)
Retic Count, Absolute: 38.3 10*3/uL (ref 19.0–186.0)
Retic Ct Pct: 0.9 % (ref 0.4–3.1)

## 2017-06-04 LAB — VITAMIN B12: Vitamin B-12: 841 pg/mL (ref 180–914)

## 2017-06-04 LAB — PHOSPHORUS: PHOSPHORUS: 2.8 mg/dL (ref 2.5–4.6)

## 2017-06-04 MED ORDER — ONDANSETRON HCL 4 MG PO TABS: 4.0000 mg | ORAL_TABLET | Freq: Four times a day (QID) | ORAL | Status: DC | PRN

## 2017-06-04 MED ORDER — HYDRALAZINE HCL 20 MG/ML IJ SOLN
10.0000 mg | INTRAMUSCULAR | Status: DC | PRN
  Administered 2017-06-05 (×2): 10 mg via INTRAVENOUS
  Filled 2017-06-04 (×2): qty 1

## 2017-06-04 MED ORDER — VERAPAMIL HCL ER 180 MG PO TBCR
180.0000 mg | EXTENDED_RELEASE_TABLET | Freq: Two times a day (BID) | ORAL | Status: DC
  Administered 2017-06-06: 180 mg via ORAL
  Filled 2017-06-04 (×5): qty 1

## 2017-06-04 MED ORDER — SODIUM CHLORIDE 0.9% FLUSH
3.0000 mL | Freq: Two times a day (BID) | INTRAVENOUS | Status: DC
  Administered 2017-06-05 – 2017-06-06 (×4): 3 mL via INTRAVENOUS

## 2017-06-04 MED ORDER — MIRTAZAPINE 15 MG PO TBDP
15.0000 mg | ORAL_TABLET | Freq: Every day | ORAL | Status: DC
  Filled 2017-06-04 (×3): qty 1

## 2017-06-04 MED ORDER — APIXABAN 5 MG PO TABS
5.0000 mg | ORAL_TABLET | Freq: Two times a day (BID) | ORAL | Status: DC
  Administered 2017-06-05 – 2017-06-06 (×2): 5 mg via ORAL
  Filled 2017-06-04 (×2): qty 1

## 2017-06-04 MED ORDER — ACETAMINOPHEN 325 MG PO TABS: 650.0000 mg | ORAL_TABLET | Freq: Four times a day (QID) | ORAL | Status: DC | PRN

## 2017-06-04 MED ORDER — ONDANSETRON HCL 4 MG/2ML IJ SOLN: 4.0000 mg | Freq: Four times a day (QID) | INTRAMUSCULAR | Status: DC | PRN

## 2017-06-04 MED ORDER — LOSARTAN POTASSIUM 50 MG PO TABS
100.0000 mg | ORAL_TABLET | Freq: Every day | ORAL | Status: DC
  Administered 2017-06-05 – 2017-06-06 (×2): 100 mg via ORAL
  Filled 2017-06-04 (×2): qty 2

## 2017-06-04 MED ORDER — ACETAMINOPHEN 650 MG RE SUPP: 650.0000 mg | Freq: Four times a day (QID) | RECTAL | Status: DC | PRN

## 2017-06-04 MED ORDER — FLUOXETINE HCL 20 MG PO CAPS
40.0000 mg | ORAL_CAPSULE | Freq: Every day | ORAL | Status: DC
  Administered 2017-06-05 – 2017-06-06 (×2): 40 mg via ORAL
  Filled 2017-06-04: qty 2

## 2017-06-04 MED ORDER — HYDRALAZINE HCL 20 MG/ML IJ SOLN: 5.0000 mg | Freq: Four times a day (QID) | INTRAMUSCULAR | Status: DC | PRN

## 2017-06-04 MED ORDER — CARVEDILOL 12.5 MG PO TABS
25.0000 mg | ORAL_TABLET | Freq: Two times a day (BID) | ORAL | Status: DC
  Administered 2017-06-05 – 2017-06-06 (×3): 25 mg via ORAL
  Filled 2017-06-04 (×5): qty 2

## 2017-06-04 MED ORDER — SIMVASTATIN 40 MG PO TABS: 40.0000 mg | ORAL_TABLET | Freq: Every day | ORAL | Status: DC

## 2017-06-04 MED ORDER — ATORVASTATIN CALCIUM 10 MG PO TABS
20.0000 mg | ORAL_TABLET | Freq: Every day | ORAL | Status: DC
  Administered 2017-06-05 – 2017-06-06 (×2): 20 mg via ORAL
  Filled 2017-06-04 (×3): qty 2

## 2017-06-04 NOTE — ED Notes (Signed)
Pt transported to MRI, will go to [facility] after MRI.

## 2017-06-04 NOTE — Progress Notes (Signed)
New Admission Note:  Arrival Method: on stretcher from ED  Mental Orientation: pt is non verbal  Telemetry: box 3 Assessment: Completed Skin: bruising on left thigh and hip, abrasion on tip of nose IV: R AC Pain: pt asleep  Safety Measures: Safety Fall Prevention Plan was given, discussed.  5M10: Patient has been orientated to the room, unit and the staff. Family:  Orders have been reviewed and implemented. Will continue to monitor the patient. Call light has been placed within reach and bed alarm has been activated.   Lawernce IonYari Olin Gurski ,RN

## 2017-06-04 NOTE — ED Triage Notes (Addendum)
Pt was in the ED last 2 weeks for SI.  Then this am at 0500 patient called for her daughter to help her to the restroom and she could not help her cause she was sick (per daughter). Pt got up with walker and she urinated. She fell face forward with walker and hit door frame with head and had a lot of bleeding from mouth per daughter.  Pt is acting odd since fall and she is on blood thinners - eliquis.  Pt usually speaks and is not speaking.  She did nod head to pain.  Pt family the fall witnessed and she did not pass out.

## 2017-06-04 NOTE — ED Notes (Signed)
Family at bedside. 

## 2017-06-04 NOTE — ED Notes (Signed)
Unable to do swallow screen at this time due to pt not illiciting any response or answering any questions.

## 2017-06-04 NOTE — ED Notes (Signed)
Attempting to speak with pt while she was alone in room with Martie LeeSabrina RN. Pt provided a pen & paper to write down anything she needed to tell us.

## 2017-06-04 NOTE — ED Notes (Signed)
Report given to 19M RN will transport after MRI.
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
## 2017-06-04 NOTE — ED Notes (Signed)
ED Provider at bedside. 

## 2017-06-04 NOTE — ED Provider Notes (Signed)
[facility]-EMERGENCY DEPT Provider Note   CSN: [id number] Arrival date & time: 06/04/17  1108     History   Chief Complaint Chief Complaint  Patient presents with  . Fall    HPI Christina [name] is a 71 y.o. female.  HPI  Level V caveat due to nonverbal 71 year old female who presents after fall with altered mental status. She has a history of hypertension, hyperlipidemia, prior stroke, and paroxysmal atrial fibrillation on Apixaban. History is provided by patient's daughter as patient is unable to give history herself. She was assisting patient off of the commode earlier this morning, when patient lost her balance and fell forward falling on her face. She did not have any witnessed loss of consciousness, but since then daughter is noted that she has not been talking. she was talking earlier this morning when she woke up, asking her daughter to help her to use the restroom. Yesterday seemed to be ambulating normally and her normal self. Denies any recent illnesses including fever, cough, difficulty breathing, or any complaints of pain.   Past Medical History:  Diagnosis Date  . Hypertension   . Stroke [facility](HCC)     Patient Active Problem List   Diagnosis Date Noted  . Uncontrolled hypertension 06/04/2017  . Fall at home 06/04/2017  . Altered mental status 06/04/2017  . History of CVA in adulthood 06/04/2017  . AF (paroxysmal atrial fibrillation) (HCC) 06/04/2017  . CKD (chronic kidney disease), stage III 06/04/2017  . Depression 06/04/2017  . Encephalopathy, hypertensive 05/07/2017  . Enterococcus UTI 05/07/2017  . Hypertensive urgency 05/02/2017  . Hypertensive emergency 05/01/2017  . History of suicidal ideation   . Hypokalemia   . Suicide ideation   . Sleep disturbance   . PAF (paroxysmal atrial fibrillation) (HCC)   . Labile blood pressure   . AKI (acute kidney injury) (HCC)   . Stage 3 chronic kidney disease   . Acute blood loss anemia   . Benign essential HTN   .  Adjustment disorder with mixed anxiety and depressed mood   . 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 Left hemiparesis (HCC)   . Primary osteoarthritis of left knee   . Left knee pain   . Diplopia   . Acute cystitis without hematuria   . Stroke (HCC)   . TIA (transient ischemic attack) 03/14/2017  . History of CVA (cerebrovascular accident) 03/14/2017  . HTN (hypertension) 03/14/2017  . HLD (hyperlipidemia) 03/14/2017  . 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 ABDOMINAL HYSTERECTOMY    . EYE SURGERY      OB History    No data available       Home Medications    Prior to Admission medications   Medication Sig Start Date End Date Taking? Authorizing Provider  acetaminophen (TYLENOL) 500 MG tablet Take 2 tablets (1,000 mg total) by mouth every 6 (six) hours as needed. 05/08/17  Yes Hairston, Oren BeckmannMandesia R, FNP  apixaban (ELIQUIS) 5 MG TABS tablet Take 1 tablet (5 mg total) by mouth 2 (two) times daily. 05/08/17  Yes Hairston, Oren BeckmannMandesia R, FNP  carvedilol (COREG) 25 MG tablet Take 1 tablet (25 mg total) by mouth 2 (two) times daily with a meal. 05/08/17  Yes Hairston, Mandesia R, FNP  cephALEXin (KEFLEX) 500 MG capsule Take 2 capsules (1,000 mg total) by mouth 2 (two) times daily. 05/22/17  Yes Lawyer, Cristal Deerhristopher, PA-C  FLUoxetine (PROZAC) 40 MG capsule Take 1 capsule (40 mg  total) by mouth daily. 05/08/17  Yes Hairston, Oren Beckmann, FNP  hydrochlorothiazide (HYDRODIURIL) 25 MG tablet Take 1 tablet (25 mg total) by mouth daily. 04/04/17  Yes Angiulli, Mcarthur Rossetti, PA-C  latanoprost (XALATAN) 0.005 % ophthalmic solution Place 1 drop into both eyes every morning. 04/04/17  Yes Angiulli, Mcarthur Rossetti, PA-C  lidocaine (LIDODERM) 5 % Place 1 patch onto the skin daily. Remove & Discard patch within 12 hours or as directed by MD 04/04/17  Yes Angiulli, Mcarthur Rossetti, PA-C  losartan (COZAAR) 100 MG tablet Take 1 tablet (100 mg total) by mouth daily. 04/04/17   Yes Angiulli, Mcarthur Rossetti, PA-C  mirtazapine (REMERON SOL-TAB) 15 MG disintegrating tablet Take 1 tablet (15 mg total) by mouth at bedtime. 05/08/17  Yes Hairston, Oren Beckmann, FNP  nitrofurantoin, macrocrystal-monohydrate, (MACROBID) 100 MG capsule Take 1 capsule (100 mg total) by mouth 2 (two) times daily. 05/08/17  Yes Hairston, Oren Beckmann, FNP  potassium chloride SA (K-DUR,KLOR-CON) 20 MEQ tablet Take 1 tablet (20 mEq total) by mouth daily. 04/04/17  Yes Angiulli, Mcarthur Rossetti, PA-C  senna-docusate (SENOKOT-S) 8.6-50 MG tablet Take 1 tablet by mouth at bedtime as needed for moderate constipation. Patient taking differently: Take 1 tablet by mouth at bedtime as needed for moderate constipation.  03/19/17  Yes Richarda Overlie, MD  simvastatin (ZOCOR) 40 MG tablet Take 1 tablet (40 mg total) by mouth daily. 05/16/17  Yes Hairston, Oren Beckmann, FNP  traMADol (ULTRAM) 50 MG tablet Take 2 tablets (100 mg total) by mouth every 12 (twelve) hours as needed for moderate pain. 04/04/17  Yes Angiulli, Mcarthur Rossetti, PA-C  verapamil (CALAN-SR) 180 MG CR tablet Take 1 tablet (180 mg total) by mouth 2 (two) times daily. 05/08/17  Yes Hairston, Mandesia R, FNP  VOLTAREN 1 % GEL Apply 2 g topically 4 (four) times daily. 05/26/17  Yes Lizbeth Bark, FNP    Family History Family History  Problem Relation Age of Onset  . Heart disease Mother   . Diabetes Mother   . Heart disease Father   . Heart disease Sister   . Heart disease Sister     Social History Social History  Substance Use Topics  . Smoking status: Former Games developer  . Smokeless tobacco: Never Used  . Alcohol use No     Allergies   Patient has no known allergies.   Review of Systems Review of Systems  Unable to perform ROS: Patient nonverbal     Physical Exam Updated Vital Signs BP (!) 174/85   Pulse 78   Temp 98.2 F (36.8 C) (Oral)   Resp 17   SpO2 98%   Physical Exam  Constitutional: She appears well-developed and well-nourished.  HENT:    Head: Normocephalic.  Abrasions to the nose   Eyes: EOM are normal. Pupils are equal, round, and reactive to light.  Neck: Normal range of motion. Neck supple.  Cardiovascular: Normal rate and regular rhythm.   Pulmonary/Chest: Effort normal and breath sounds normal.  Abdominal: Soft. She exhibits no distension. There is no tenderness.  Musculoskeletal: She exhibits no edema or deformity.  Mild bruising to the left upper arm   Neurological: She is alert. She exhibits normal muscle tone.  Limited by lack of cooperation. EOMI, PERRL Will open eyes and mouth to command Bilateral hand grip symmetric Wiggles bilateral feet to command.  Unable to test sensory as patient non-verbal and will not answer questions related to sensation  Nursing note and vitals reviewed.    ED  Treatments / Results  Labs (all labs ordered are listed, but only abnormal results are displayed) Labs Reviewed  COMPREHENSIVE METABOLIC PANEL - Abnormal; Notable for the following:       Result Value   Glucose, Bld 114 (*)    BUN 22 (*)    Creatinine, Ser 1.24 (*)    ALT 12 (*)    GFR calc non Af Amer 43 (*)    GFR calc Af Amer 50 (*)    All other components within normal limits  CBG MONITORING, ED - Abnormal; Notable for the following:    Glucose-Capillary 111 (*)    All other components within normal limits  URINE CULTURE  CBC WITH DIFFERENTIAL/PLATELET  URINALYSIS, ROUTINE W REFLEX MICROSCOPIC  RAPID URINE DRUG SCREEN, HOSP PERFORMED  TSH  RPR  VITAMIN B12  FOLATE  IRON AND TIBC  FERRITIN  RETICULOCYTES  AMMONIA  HIV ANTIBODY (ROUTINE TESTING)    EKG  EKG Interpretation  Date/Time:  Wednesday June 04 2017 11:39:03 EDT Ventricular Rate:  57 PR Interval:    QRS Duration: 96 QT Interval:  457 QTC Calculation: 445 R Axis:   51 Text Interpretation:  Sinus rhythm similar to prior EKG  Confirmed by Crista Curb [phone]) on 06/04/2017 11:56:31 AM       Radiology Ct Head Wo Contrast  Result  Date: 06/04/2017 CLINICAL DATA:  Status post fall. No loss of consciousness. Confused. EXAM: CT HEAD WITHOUT CONTRAST CT CERVICAL SPINE WITHOUT CONTRAST TECHNIQUE: Multidetector CT imaging of the head and cervical spine was performed following the standard protocol without intravenous contrast. Multiplanar CT image reconstructions of the cervical spine were also generated. COMPARISON:  05/01/2017 FINDINGS: CT HEAD FINDINGS Brain: No evidence of acute infarction, hemorrhage, extra-axial collection, ventriculomegaly, or mass effect. Old right frontal lobe infarct with encephalomalacia. Small area of encephalomalacia in the left parietal lobe. Generalized cerebral atrophy. Periventricular white matter low attenuation likely secondary to microangiopathy. Vascular: Cerebrovascular atherosclerotic calcifications are noted. Skull: Negative for fracture or focal lesion. Sinuses/Orbits: Visualized portions of the orbits are unremarkable. Visualized portions of the paranasal sinuses and mastoid air cells are unremarkable. Other: None. CT CERVICAL SPINE FINDINGS Alignment: Normal. Skull base and vertebrae: No acute fracture. No primary bone lesion or focal pathologic process. Soft tissues and spinal canal: No prevertebral fluid or swelling. No visible canal hematoma. Disc levels: Degenerative disc disease with disc height loss C4-5, C5-6 and C6-7. Mild bilateral uncovertebral degenerative changes at C4-5, C5-6 and C6-7. Mild left foraminal stenosis at C4-5. Mild bilateral foraminal stenosis at C5-6. Mild right foraminal stenosis at C6-7. Upper chest: Lung apices are clear. Other: Bilateral carotid artery atherosclerosis. IMPRESSION: 1. No acute intracranial pathology. 2. No acute osseous injury of the cervical spine. 3. Cervical spine spondylosis as described above. Electronically Signed   By: Elige Ko   On: 06/04/2017 12:19   Ct Cervical Spine Wo Contrast  Result Date: 06/04/2017 CLINICAL DATA:  Status post fall. No  loss of consciousness. Confused. EXAM: CT HEAD WITHOUT CONTRAST CT CERVICAL SPINE WITHOUT CONTRAST TECHNIQUE: Multidetector CT imaging of the head and cervical spine was performed following the standard protocol without intravenous contrast. Multiplanar CT image reconstructions of the cervical spine were also generated. COMPARISON:  05/01/2017 FINDINGS: CT HEAD FINDINGS Brain: No evidence of acute infarction, hemorrhage, extra-axial collection, ventriculomegaly, or mass effect. Old right frontal lobe infarct with encephalomalacia. Small area of encephalomalacia in the left parietal lobe. Generalized cerebral atrophy. Periventricular white matter low attenuation likely secondary to microangiopathy.  Vascular: Cerebrovascular atherosclerotic calcifications are noted. Skull: Negative for fracture or focal lesion. Sinuses/Orbits: Visualized portions of the orbits are unremarkable. Visualized portions of the paranasal sinuses and mastoid air cells are unremarkable. Other: None. CT CERVICAL SPINE FINDINGS Alignment: Normal. Skull base and vertebrae: No acute fracture. No primary bone lesion or focal pathologic process. Soft tissues and spinal canal: No prevertebral fluid or swelling. No visible canal hematoma. Disc levels: Degenerative disc disease with disc height loss C4-5, C5-6 and C6-7. Mild bilateral uncovertebral degenerative changes at C4-5, C5-6 and C6-7. Mild left foraminal stenosis at C4-5. Mild bilateral foraminal stenosis at C5-6. Mild right foraminal stenosis at C6-7. Upper chest: Lung apices are clear. Other: Bilateral carotid artery atherosclerosis. IMPRESSION: 1. No acute intracranial pathology. 2. No acute osseous injury of the cervical spine. 3. Cervical spine spondylosis as described above. Electronically Signed   By: Elige Ko   On: 06/04/2017 12:19    Procedures Procedures (including critical care time)  Medications Ordered in ED Medications  hydrALAZINE (APRESOLINE) injection 10 mg (not  administered)     Initial Impression / Assessment and Plan / ED Course  I have reviewed the triage vital signs and the nursing notes.  Pertinent labs & imaging results that were available during my care of the patient were reviewed by me and considered in my medical decision making (see chart for details).     Presents with aphasia after head injury. Non-verbal in ED, but no focal weakness or other focal deficits. CT head and c-spine without acute traumatic injury. Blood work reassuring. Records reviewed, seen in ED 2 weeks ago for SI and depression. Recommended geri psych but family states they needed PCP work-up before placement, which has no happened yet. Potential CVA versus psych disorder. Discussed with Dr. Otelia Limes from neurology, who recommended MRI. UA also pending. Will admit to hospitalist service for ongoing work-up.  Final Clinical Impressions(s) / ED Diagnoses   Final diagnoses:  Aphasia    New Prescriptions New Prescriptions   No medications on file     Lavera Guise, MD 06/04/17 1705

## 2017-06-05 ENCOUNTER — Telehealth: Payer: Self-pay | Admitting: Family Medicine

## 2017-06-05 ENCOUNTER — Encounter (HOSPITAL_COMMUNITY): Payer: Self-pay | Admitting: Neurology

## 2017-06-05 ENCOUNTER — Inpatient Hospital Stay: Payer: Self-pay | Admitting: Family Medicine

## 2017-06-05 DIAGNOSIS — I480 Paroxysmal atrial fibrillation: Secondary | ICD-10-CM | POA: Diagnosis not present

## 2017-06-05 DIAGNOSIS — E785 Hyperlipidemia, unspecified: Secondary | ICD-10-CM | POA: Diagnosis not present

## 2017-06-05 DIAGNOSIS — I63413 Cerebral infarction due to embolism of bilateral middle cerebral arteries: Secondary | ICD-10-CM | POA: Diagnosis not present

## 2017-06-05 DIAGNOSIS — I10 Essential (primary) hypertension: Secondary | ICD-10-CM | POA: Diagnosis not present

## 2017-06-05 DIAGNOSIS — F329 Major depressive disorder, single episode, unspecified: Secondary | ICD-10-CM | POA: Diagnosis not present

## 2017-06-05 DIAGNOSIS — I638 Other cerebral infarction: Secondary | ICD-10-CM | POA: Diagnosis not present

## 2017-06-05 DIAGNOSIS — Z8673 Personal history of transient ischemic attack (TIA), and cerebral infarction without residual deficits: Secondary | ICD-10-CM | POA: Diagnosis not present

## 2017-06-05 DIAGNOSIS — R4701 Aphasia: Secondary | ICD-10-CM | POA: Diagnosis not present

## 2017-06-05 LAB — RAPID URINE DRUG SCREEN, HOSP PERFORMED
Amphetamines: NOT DETECTED
BARBITURATES: NOT DETECTED
BENZODIAZEPINES: NOT DETECTED
COCAINE: NOT DETECTED
Opiates: NOT DETECTED
Tetrahydrocannabinol: NOT DETECTED

## 2017-06-05 LAB — COMPREHENSIVE METABOLIC PANEL
ALK PHOS: 64 U/L (ref 38–126)
ALT: 13 U/L — AB (ref 14–54)
AST: 18 U/L (ref 15–41)
Albumin: 3.7 g/dL (ref 3.5–5.0)
Anion gap: 10 (ref 5–15)
BILIRUBIN TOTAL: 0.8 mg/dL (ref 0.3–1.2)
BUN: 18 mg/dL (ref 6–20)
CALCIUM: 10 mg/dL (ref 8.9–10.3)
CO2: 23 mmol/L (ref 22–32)
CREATININE: 1.19 mg/dL — AB (ref 0.44–1.00)
Chloride: 102 mmol/L (ref 101–111)
GFR, EST AFRICAN AMERICAN: 52 mL/min — AB (ref >60)
GFR, EST NON AFRICAN AMERICAN: 45 mL/min — AB (ref >60)
Glucose, Bld: 123 mg/dL — ABNORMAL HIGH (ref 65–99)
Potassium: 3.1 mmol/L — ABNORMAL LOW (ref 3.5–5.1)
Sodium: 135 mmol/L (ref 135–145)
TOTAL PROTEIN: 7.1 g/dL (ref 6.5–8.1)

## 2017-06-05 LAB — CBC
HCT: 37.1 % (ref 36.0–46.0)
Hemoglobin: 12.1 g/dL (ref 12.0–15.0)
MCH: 26.7 pg (ref 26.0–34.0)
MCHC: 32.6 g/dL (ref 30.0–36.0)
MCV: 81.9 fL (ref 78.0–100.0)
PLATELETS: 229 10*3/uL (ref 150–400)
RBC: 4.53 MIL/uL (ref 3.87–5.11)
RDW: 12.9 % (ref 11.5–15.5)
WBC: 5.8 10*3/uL (ref 4.0–10.5)

## 2017-06-05 LAB — URINALYSIS, ROUTINE W REFLEX MICROSCOPIC
BILIRUBIN URINE: NEGATIVE
Bacteria, UA: NONE SEEN
Glucose, UA: NEGATIVE mg/dL
Hgb urine dipstick: NEGATIVE
KETONES UR: NEGATIVE mg/dL
Nitrite: NEGATIVE
PROTEIN: NEGATIVE mg/dL
SQUAMOUS EPITHELIAL / LPF: NONE SEEN
Specific Gravity, Urine: 1.011 (ref 1.005–1.030)
pH: 7 (ref 5.0–8.0)

## 2017-06-05 LAB — HIV ANTIBODY (ROUTINE TESTING W REFLEX): HIV Screen 4th Generation wRfx: NONREACTIVE

## 2017-06-05 LAB — RPR: RPR: NONREACTIVE

## 2017-06-05 LAB — MAGNESIUM: MAGNESIUM: 1.5 mg/dL — AB (ref 1.7–2.4)

## 2017-06-05 MED ORDER — MAGNESIUM SULFATE 2 GM/50ML IV SOLN
2.0000 g | Freq: Once | INTRAVENOUS | Status: AC
  Administered 2017-06-06: 2 g via INTRAVENOUS
  Filled 2017-06-05: qty 50

## 2017-06-05 MED ORDER — POTASSIUM CHLORIDE CRYS ER 20 MEQ PO TBCR
40.0000 meq | EXTENDED_RELEASE_TABLET | ORAL | Status: AC
  Administered 2017-06-05: 40 meq via ORAL
  Filled 2017-06-05 (×2): qty 2

## 2017-06-05 NOTE — Telephone Encounter (Signed)
[facility] called to informed the PCP, that PT is at the hospital at this moment, PT has reach the maximum potential approaching and max of visit on Medicaid, they recommend to be sent to [facility], PT has been discharge since 05/16/17

## 2017-06-05 NOTE — Consult Note (Signed)
Requesting Physician: Dr. Gwenlyn Perking    Chief Complaint: Stroke  History obtained from:   Chart and Family  HPI:                                                                                                                                         Christina [name] is an 71 y.o. female with medical history significant for PAF on apixaban, history of CVA, hypertension, dyslipidemia, prior tobacco use, recurrent cystitis who moved to the [place] from [place] In March 2018, the patient apparently suffered a stroke while living in [place] with associated left-sided weakness and was started on aspirin and Plavix. She was discharged but was reported as unable to ambulate so she was brought to the [name] for further management. Workup during March admission revealed embolic bilateral strokes in setting of paroxysmal atrial fibrillation with subsequent discontinuation of aspirin and Plavix and initiation of apixaban.  She did receive additional therapeutic treatment in CIR and was discharged back to home in mid April. She was readmitted to the hospital last month and was discharged on 5/12 secondary to hypertensive encephalopathy. At time of presentation she was noted to have altered mental status for 4 days and systolic blood pressure greater than 200. With improvement in patient's blood pressure during that hospitalization her mental status improved to baseline. At that hospitalization she was also found to have an enterococcus UTI was 60,000 colonies noted and she was treated with Macrobid 100 mg for 5 days. Since that admission she has followed up several times at the [facility] regarding monitoring of her blood pressure. She is also followed up at the physical medicine and rehabilitation clinic regarding knee pain and has undergone Celestone injection. The patient has been undergoing severe bouts of depression since coming to the [name] and  there are several behavioral health counselor documentations since last discharge as well.   Pt sustainted a mechanical fall at home prior to going to PCP office for evaluation. Pt was found to be non-verbal at time of fall. Did not hit head or have any LOC. Pts condition is described to be at normal prior to event. Evaluation in the ER revealed no musculoskeletal or intracranial trauma on imaging.  MRI was obtained and showed bilateral infarcts in both the anterior and posterior circulation.   The patient is nonverbal. Apparently per nurse patient was with her daughter who does speak Spanish and was still nonverbal. She appears to have a depressed affect and avoids eye contact. Exam was somewhat limited secondary to patient's inability to follow my instructions and not being verbal. Unfortunately even with a assistant who can speak Spanish patient would not verbalize.  [name] daughter states that she is compliant with her oral anticoagulant, Eliquis.   Date last known well: Unable to determine Time last known well: Unable to determine  tPA Given: No: out of window and on Eliquis   Past Medical History:  Diagnosis Date  . Hypertension   . PAF (paroxysmal atrial fibrillation) (HCC)   . Stroke [facility])     Past Surgical History:  Procedure Laterality Date  . ABDOMINAL HYSTERECTOMY    . EYE SURGERY      Family History  Problem Relation Age of Onset  . Heart disease Mother   . Diabetes Mother   . Heart disease Father   . Heart disease Sister   . Heart disease Sister    Social History:  reports that she has quit smoking. She has never used smokeless tobacco. She reports that she does not drink alcohol or use drugs.  Allergies: No Known Allergies  Medications:                                                                                                                           Prior to Admission:  Prescriptions Prior to Admission  Medication Sig Dispense Refill Last Dose  .  acetaminophen (TYLENOL) 500 MG tablet Take 2 tablets (1,000 mg total) by mouth every 6 (six) hours as needed. 30 tablet 0 PRN  . apixaban (ELIQUIS) 5 MG TABS tablet Take 1 tablet (5 mg total) by mouth 2 (two) times daily. 60 tablet 2 06/04/2017 at Unknown time  . carvedilol (COREG) 25 MG tablet Take 1 tablet (25 mg total) by mouth 2 (two) times daily with a meal. 60 tablet 2 06/04/2017 at Unknown time  . cephALEXin (KEFLEX) 500 MG capsule Take 2 capsules (1,000 mg total) by mouth 2 (two) times daily. 28 capsule 0 06/04/2017 at Unknown time  . FLUoxetine (PROZAC) 40 MG capsule Take 1 capsule (40 mg total) by mouth daily. 30 capsule 2 06/04/2017 at Unknown time  . hydrochlorothiazide (HYDRODIURIL) 25 MG tablet Take 1 tablet (25 mg total) by mouth daily. 30 tablet 1 06/04/2017 at Unknown time  . latanoprost (XALATAN) 0.005 % ophthalmic solution Place 1 drop into both eyes every morning. 2.5 mL 12 06/04/2017 at Unknown time  . lidocaine (LIDODERM) 5 % Place 1 patch onto the skin daily. Remove & Discard patch within 12 hours or as directed by MD 30 patch 0 06/04/2017 at Unknown time  . losartan (COZAAR) 100 MG tablet Take 1 tablet (100 mg total) by mouth daily. 30 tablet 1 06/04/2017 at Unknown time  . mirtazapine (REMERON SOL-TAB) 15 MG disintegrating tablet Take 1 tablet (15 mg total) by mouth at bedtime. 30 tablet 2 06/03/2017 at Unknown time  . nitrofurantoin, macrocrystal-monohydrate, (MACROBID) 100 MG capsule Take 1 capsule (100 mg total) by mouth 2 (two) times daily. 10 capsule 0 06/04/2017 at Unknown time  . potassium chloride SA (K-DUR,KLOR-CON) 20 MEQ tablet Take 1 tablet (20 mEq total) by mouth daily. 30 tablet 1 06/04/2017 at Unknown time  . senna-docusate (SENOKOT-S) 8.6-50 MG tablet Take 1 tablet by mouth at bedtime as needed for moderate constipation. (Patient taking differently:  Take 1 tablet by mouth at bedtime as needed for moderate constipation. ) 30 tablet 1 PRN  . simvastatin (ZOCOR) 40 MG tablet  Take 1 tablet (40 mg total) by mouth daily. 90 tablet 0 06/04/2017 at Unknown time  . traMADol (ULTRAM) 50 MG tablet Take 2 tablets (100 mg total) by mouth every 12 (twelve) hours as needed for moderate pain. 20 tablet 0 PRN  . verapamil (CALAN-SR) 180 MG CR tablet Take 1 tablet (180 mg total) by mouth 2 (two) times daily. 60 tablet 2 06/04/2017 at Unknown time  . VOLTAREN 1 % GEL Apply 2 g topically 4 (four) times daily. 100 g 1 06/04/2017 at Unknown time    ROS:                                                                                                                                       History unobtainable from patient due to aphasia.   General Examination:                                                                                                      Blood pressure (!) 180/72, pulse (!) 103, temperature 99.5 F (37.5 C), temperature source Oral, resp. rate 18, weight 60.7 kg (133 lb 14.4 oz), SpO2 98 %.  HEENT-  Abrasions to nose.    Lungs- chest clear, no wheezing, rales, normal symmetric air entry Abdomen- normal findings: bowel sounds normal Extremities- no edema Musculoskeletal-no joint tenderness, deformity or swelling Skin-warm and dry, no hyperpigmentation, vitiligo, or suspicious lesions  Neurological Examination Mental Status: Awake. She is non-verbal, she will follow visual commands such as raising her arms, looking left or right, squeezing my hand, pushing and pulling,  Cranial Nerves: II: Blinks to threat bilaterally III,IV, VI: ptosis not present, extra-ocular motions intact bilaterally, PERRL V,VII: Face symmetric, facial light touch sensation normal bilaterally VIII: hearing normal bilaterally IX,X: uvula rises symmetrically XI: bilateral shoulder shrug XII: midline tongue extension Motor: Patient is moving all extremities antigravity. She has weakness in her right greater than her left; appears to have 4/5 strength in her right upper extremity with 5/5  strength in her left upper extremity. Bilateral lower extremities able to move antigravity without any difficulty Sensory: Withdraws to pinprick bilaterally upper and lower extremities Deep Tendon Reflexes: 2+ and symmetric throughout Plantars: Upgoing bilaterally Cerebellar: No dysmetria noted with finger to nose; would not cooperate with heel-to-shin Gait: Not tested   Lab Results: Basic Metabolic Panel:  Recent  Labs Lab 06/04/17 1136 06/04/17 1859 06/05/17 0407 06/05/17 0728  NA 136  --  135  --   K 3.9  --  3.1*  --   CL 103  --  102  --   CO2 23  --  23  --   GLUCOSE 114*  --  123*  --   BUN 22*  --  18  --   CREATININE 1.24*  --  1.19*  --   CALCIUM 10.0  --  10.0  --   MG  --  1.6*  --  1.5*  PHOS  --  2.8  --   --     Liver Function Tests:  Recent Labs Lab 06/04/17 1136 06/05/17 0407  AST 18 18  ALT 12* 13*  ALKPHOS 60 64  BILITOT 0.5 0.8  PROT 7.6 7.1  ALBUMIN 4.0 3.7   No results for input(s): LIPASE, AMYLASE in the last 168 hours.  Recent Labs Lab 06/04/17 1908  AMMONIA 22    CBC:  Recent Labs Lab 06/04/17 1136 06/05/17 0407  WBC 6.9 5.8  NEUTROABS 5.4  --   HGB 12.6 12.1  HCT 38.6 37.1  MCV 82.8 81.9  PLT 218 229    Cardiac Enzymes: No results for input(s): CKTOTAL, CKMB, CKMBINDEX, TROPONINI in the last 168 hours.  Lipid Panel: No results for input(s): CHOL, TRIG, HDL, CHOLHDL, VLDL, LDLCALC in the last 168 hours.  CBG:  Recent Labs Lab 06/04/17 1146  GLUCAP 111*    Microbiology: Results for orders placed or performed in visit on 05/08/17  Urine culture     Status: None   Collection Time: 05/08/17 10:29 AM  Result Value Ref Range Status   Urine Culture, Routine Final report  Final   Organism ID, Bacteria Comment  Final    Comment: Mixed urogenital flora Less than 10,000 colonies/mL     Coagulation Studies: No results for input(s): LABPROT, INR in the last 72 hours.  Imaging: Ct Head Wo Contrast  Result Date:  06/04/2017 CLINICAL DATA:  Status post fall. No loss of consciousness. Confused. EXAM: CT HEAD WITHOUT CONTRAST CT CERVICAL SPINE WITHOUT CONTRAST TECHNIQUE: Multidetector CT imaging of the head and cervical spine was performed following the standard protocol without intravenous contrast. Multiplanar CT image reconstructions of the cervical spine were also generated. COMPARISON:  05/01/2017 FINDINGS: CT HEAD FINDINGS Brain: No evidence of acute infarction, hemorrhage, extra-axial collection, ventriculomegaly, or mass effect. Old right frontal lobe infarct with encephalomalacia. Small area of encephalomalacia in the left parietal lobe. Generalized cerebral atrophy. Periventricular white matter low attenuation likely secondary to microangiopathy. Vascular: Cerebrovascular atherosclerotic calcifications are noted. Skull: Negative for fracture or focal lesion. Sinuses/Orbits: Visualized portions of the orbits are unremarkable. Visualized portions of the paranasal sinuses and mastoid air cells are unremarkable. Other: None. CT CERVICAL SPINE FINDINGS Alignment: Normal. Skull base and vertebrae: No acute fracture. No primary bone lesion or focal pathologic process. Soft tissues and spinal canal: No prevertebral fluid or swelling. No visible canal hematoma. Disc levels: Degenerative disc disease with disc height loss C4-5, C5-6 and C6-7. Mild bilateral uncovertebral degenerative changes at C4-5, C5-6 and C6-7. Mild left foraminal stenosis at C4-5. Mild bilateral foraminal stenosis at C5-6. Mild right foraminal stenosis at C6-7. Upper chest: Lung apices are clear. Other: Bilateral carotid artery atherosclerosis. IMPRESSION: 1. No acute intracranial pathology. 2. No acute osseous injury of the cervical spine. 3. Cervical spine spondylosis as described above. Electronically Signed   By: Elige Ko  On: 06/04/2017 12:19   Ct Cervical Spine Wo Contrast  Result Date: 06/04/2017 CLINICAL DATA:  Status post fall. No loss of  consciousness. Confused. EXAM: CT HEAD WITHOUT CONTRAST CT CERVICAL SPINE WITHOUT CONTRAST TECHNIQUE: Multidetector CT imaging of the head and cervical spine was performed following the standard protocol without intravenous contrast. Multiplanar CT image reconstructions of the cervical spine were also generated. COMPARISON:  05/01/2017 FINDINGS: CT HEAD FINDINGS Brain: No evidence of acute infarction, hemorrhage, extra-axial collection, ventriculomegaly, or mass effect. Old right frontal lobe infarct with encephalomalacia. Small area of encephalomalacia in the left parietal lobe. Generalized cerebral atrophy. Periventricular white matter low attenuation likely secondary to microangiopathy. Vascular: Cerebrovascular atherosclerotic calcifications are noted. Skull: Negative for fracture or focal lesion. Sinuses/Orbits: Visualized portions of the orbits are unremarkable. Visualized portions of the paranasal sinuses and mastoid air cells are unremarkable. Other: None. CT CERVICAL SPINE FINDINGS Alignment: Normal. Skull base and vertebrae: No acute fracture. No primary bone lesion or focal pathologic process. Soft tissues and spinal canal: No prevertebral fluid or swelling. No visible canal hematoma. Disc levels: Degenerative disc disease with disc height loss C4-5, C5-6 and C6-7. Mild bilateral uncovertebral degenerative changes at C4-5, C5-6 and C6-7. Mild left foraminal stenosis at C4-5. Mild bilateral foraminal stenosis at C5-6. Mild right foraminal stenosis at C6-7. Upper chest: Lung apices are clear. Other: Bilateral carotid artery atherosclerosis. IMPRESSION: 1. No acute intracranial pathology. 2. No acute osseous injury of the cervical spine. 3. Cervical spine spondylosis as described above. Electronically Signed   By: Elige KoHetal  Patel   On: 06/04/2017 12:19   Mr Brain Wo Contrast  Result Date: 06/04/2017 CLINICAL DATA:  Aphasia.  Atrial fibrillation. EXAM: MRI HEAD WITHOUT CONTRAST TECHNIQUE: Multiplanar,  multiecho pulse sequences of the brain and surrounding structures were obtained without intravenous contrast. COMPARISON:  Head CT 06/04/2017 FINDINGS: Brain: The midline structures are normal. There is multifocal diffusion restriction including punctate focus in the right cerebellum, larger area in the left frontal operculum and insula and a similar size area of the right cingulum/pericallosal white matter. There are multiple old cerebellar infarcts and bilateral basal ganglia lacunar infarcts. There is right frontal pole encephalomalacia predominantly involving the medial frontal gyrus. There is beginning confluent hyperintense T2-weighted signal within the periventricular white matter, most often seen in the setting of chronic microvascular ischemia. No intraparenchymal hematoma or chronic microhemorrhage. Atrophy is greater than expected for age without clear lobar predominance. The dura is normal and there is no extra-axial collection. Vascular: Major intracranial arterial and venous sinus flow voids are preserved. Skull and upper cervical spine: The Sinuses/Orbits: Left maxillary sinus mucosal thickening. Bilateral lens replacements. IMPRESSION: 1. Multifocal acute ischemia with the largest areas involving the right pericallosal white matter and left frontal operculum/and slow with a punctate focus of ischemia in the right cerebellum. 2. No hemorrhage or mass effect. 3. Old right frontal infarct and multiple small old cerebellar infarcts superimposed on chronic microvascular ischemia. Electronically Signed   By: Deatra RobinsonKevin  Herman M.D.   On: 06/04/2017 17:26    History and examination documented by Felicie Mornavid Smith PA-C, [facility], [phone] 06/05/2017, 9:54 AM   Assessment: 71 y.o. female with acute bilateral cerebral infarctions, unknown time of onset. Exam is compromised by patient's limited ability to follow commands in the context of her aphasia. 1. MRI reveals 2 acute infarctions involving  the right pericallosal white matter and left frontal operculum with an additional punctate focus of infarction in the right cerebellum. Also noted is an old  right frontal infarct and multiple small old cerebellar infarcts superimposed on chronic microvascular ischemia.  2. Given the presence of bilateral acute strokes in both the anterior and posterior vascular territories, a cardioembolic etiology is most likely. The patient is on Eliquis; her daughter states that she has been compliant. The stroke in the left frontal operculum correlates with her mutism, but speech comprehension should be preserved. A component of her poor responsiveness to verbal input may be an acute worsening of her depression. She will need a stroke workup. 3. Stroke Risk Factors - atrial fibrillation and hypertension   Recommendations: 1. HgbA1c, fasting lipid panel 2. MRA of the brain without contrast 3. PT consult, OT consult, Speech consult 4. Echocardiogram 5. Carotid dopplers 6. Prophylactic therapy-continue AC. May need to switch to an alternate anticoagulant such as dabigatran or coumadin, unless it is revealed upon further interview with family that she has been noncompliant with Eliquis 7. Risk factor modification 8. Telemetry monitoring 9. Frequent neuro checks 10 NPO until passes stroke swallow screen 11 please page stroke NP  Or  PA  Or MD from 8am -4 pm  as this patient from this time will be  followed by the stroke.   You can look them up on [email or web address]  Password [email or web address]   Electronically signed: Dr. Caryl Pina
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
## 2017-06-05 NOTE — Progress Notes (Signed)
Rehab Admissions Coordinator Note:  Patient was screened by Christina [name], Christina [name] for appropriateness for an Inpatient Acute Rehab Consult per PT recommendaiton. At this time, we are recommending Inpatient Rehab consult.  Christina [name], Christina [name] 06/05/2017, 3:59 PM  I can be reached at [phone].

## 2017-06-05 NOTE — Care Management Note (Signed)
Case Management Note  Patient Details  Name: Christina [name] MRN: [id number] Date of Birth: 1946/06/08  Subjective/Objective:   Pt admitted with CVA. She is from home with her daughter.                  Action/Plan: Awaiting PT/OT recommendations. CM following for discharge disposition.  Expected Discharge Date:                  Expected Discharge Plan:     In-House Referral:  Clinical Social Work  Discharge planning Services  CM Consult  Post Acute Care Choice:    Choice offered to:     DME Arranged:    DME Agency:     HH Arranged:    HH Agency:     Status of Service:  In process, will continue to follow  If discussed at Long Length of Stay Meetings, dates discussed:    Additional Comments:  Kermit BaloKelli F Lakita Sahlin, RN 06/05/2017, 11:43 AM

## 2017-06-05 NOTE — Evaluation (Signed)
Physical Therapy Evaluation Patient Details Name: Christina [name] MRN: [id number] DOB: 05/16/1946 Today's Date: 06/05/2017   History of Present Illness  Pt is a 71 y/o female admitted from home secondary to a mechanical fall. MRI revealed multifocal acute ischemia. PMH including but not limited to HTN and prior CVA in March of 2018.  Clinical Impression  Pt presented supine in bed with HOB elevated, awake and willing to participate in therapy session. Pt's daughter was present throughout session. All information was obtained from pt's daughter as pt did not respond verbally or non-verbally. Prior to admission, pt ambulated with use of RW and was performing ADLs independently. Pt lives with her daughter, who is with her during the day and works at night. Pt currently requires mod A for bed mobility and max A for transfers. Pt's daughter would like for her to go to CIR again prior to returning home. Pt would continue to benefit from skilled physical therapy services at this time while admitted and after d/c to address the below listed limitations in order to improve overall safety and independence with functional mobility.     Follow Up Recommendations CIR    Equipment Recommendations  None recommended by PT    Recommendations for Other Services Rehab consult     Precautions / Restrictions Precautions Precautions: Fall Restrictions Weight Bearing Restrictions: No      Mobility  Bed Mobility Overal bed mobility: Needs Assistance Bed Mobility: Supine to Sit     Supine to sit: Mod assist     General bed mobility comments: increased time and effort, mod A to achieve sitting EOB  Transfers Overall transfer level: Needs assistance Equipment used: 2 person hand held assist Transfers: Sit to/from BJ'sStand;Stand Pivot Transfers Sit to Stand: Max assist Stand pivot transfers: Max assist       General transfer comment: increased time and effort, verbal and tactile cueing for sequencing,  max A to rise into standing from sitting EOB and max A with pivotal movements from bed<>BSC<>recliner  Ambulation/Gait                Stairs            Wheelchair Mobility    Modified Rankin (Stroke Patients Only) Modified Rankin (Stroke Patients Only) Pre-Morbid Rankin Score: Moderate disability Modified Rankin: Severe disability     Balance Overall balance assessment: Needs assistance Sitting-balance support: Feet supported Sitting balance-Leahy Scale: Fair Sitting balance - Comments: pt able to sit EOB with supervision   Standing balance support: During functional activity Standing balance-Leahy Scale: Zero Standing balance comment: max A                             Pertinent Vitals/Pain Pain Assessment: Faces Faces Pain Scale: No hurt    Home Living Family/patient expects to be discharged to:: Private residence Living Arrangements: Children Available Help at Discharge: Family;Available 24 hours/day Type of Home: House Home Access: Level entry     Home Layout: One level Home Equipment: Walker - 2 wheels;Bedside commode      Prior Function Level of Independence: Independent with assistive device(s)   Gait / Transfers Assistance Needed: pt's daughter reported that she was ambulating with use of RW  ADL's / Homemaking Assistance Needed: independent (per daughter)        Hand Dominance   Dominant Hand: Right    Extremity/Trunk Assessment   Upper Extremity Assessment Upper Extremity Assessment: Defer to OT evaluation  Lower Extremity Assessment Lower Extremity Assessment: Generalized weakness       Communication   Communication: Receptive difficulties;Expressive difficulties  Cognition Arousal/Alertness: Awake/alert Behavior During Therapy: Flat affect Overall Cognitive Status: Difficult to assess                                 General Comments: pt did no respond verbally or non-verbally to any questions  or commands      General Comments      Exercises     Assessment/Plan    PT Assessment Patient needs continued PT services  PT Problem List Decreased strength;Decreased range of motion;Decreased activity tolerance;Decreased balance;Decreased mobility;Decreased coordination;Decreased knowledge of use of DME;Decreased cognition;Decreased safety awareness;Decreased knowledge of precautions       PT Treatment Interventions DME instruction;Stair training;Gait training;Functional mobility training;Therapeutic activities;Patient/family education;Therapeutic exercise;Balance training;Neuromuscular re-education;Cognitive remediation    PT Goals (Current goals can be found in the Care Plan section)  Acute Rehab PT Goals Patient Stated Goal: go to CIR (per daughter) PT Goal Formulation: With family Time For Goal Achievement: 06/19/17 Potential to Achieve Goals: Fair    Frequency Min 3X/week   Barriers to discharge        Co-evaluation               AM-PAC PT "6 Clicks" Daily Activity  Outcome Measure Difficulty turning over in bed (including adjusting bedclothes, sheets and blankets)?: Total Difficulty moving from lying on back to sitting on the side of the bed? : Total Difficulty sitting down on and standing up from a chair with arms (e.g., wheelchair, bedside commode, etc,.)?: Total Help needed moving to and from a bed to chair (including a wheelchair)?: A Lot Help needed walking in hospital room?: Total Help needed climbing 3-5 steps with a railing? : Total 6 Click Score: 7    End of Session Equipment Utilized During Treatment: Gait belt Activity Tolerance: Patient tolerated treatment well Patient left: in chair;with call bell/phone within reach;with family/visitor present Nurse Communication: Mobility status PT Visit Diagnosis: Other abnormalities of gait and mobility (R26.89);Other symptoms and signs involving the nervous system (R29.898)    Time: 4098-1191 PT Time  Calculation (min) (ACUTE ONLY): 27 min   Charges:   PT Evaluation $PT Eval Moderate Complexity: 1 Procedure PT Treatments $Therapeutic Activity: 8-22 mins   PT G Codes:        [place], PT, DPT [phone]   Alessandra Bevels Breniyah Romm 06/05/2017, 3:32 PM

## 2017-06-05 NOTE — Progress Notes (Signed)
[facility] PROGRESS NOTE  Christina [name] UXL:[id number] DOB: 05-Jul-1946 DOA: 06/04/2017 PCP: Lizbeth BarkHairston, Mandesia R, FNP  Interim summary and HPI 71 y/o female originally from [place]; who had PMH significant for depression, HTN, HLD, PAF and prior CVA; who presented with non verbal event after mechanical fall at home. Found to have ischemic stroke affecting pericallosal white matter and left operculum.  Assessment/Plan: 1-acute ischemic stroke involving pericallosal white matter and left frontal operculum. -will complete work up -patient with hx of PAF, chronically on eliquis -per family she is compliant with meds; but has underlying dementia and behavioral problems, making her unreliable. -will repeat echo -continue eliquis -will follow neurology rec's  2-HTN -will resume home medications -being permissive with HTN in acute CVA setting  3-HLD will continue statins  4-hypokalemia and  Hypomagnesemia -will replete electrolytes and follow trend   5-depression -will continue prozac and remeron   Code Status: Full Family Communication: no family at bedside  Disposition Plan: to be determined. Will finish stroke work up; per PT/OT rec's CIR consulted. Will follow neurology rec's.   Consultants:  Neurology   Procedures:  See below for x-ray reports   Antibiotics:  None   HPI/Subjective: Afebrile, no appreciated SOB or acute distress. Patient is aphasic and non verbal currently.  Objective: Vitals:   06/05/17 1732 06/05/17 2112  BP: (!) 162/78 (!) 133/57  Pulse: 78 70  Resp: 18 18  Temp: 99.2 F (37.3 C) 98.6 F (37 C)    Intake/Output Summary (Last 24 hours) at 06/05/17 2252 Last data filed at 06/05/17 1300  Gross per 24 hour  Intake              240 ml  Output              200 ml  Net               40 ml   Filed Weights   06/05/17 0500  Weight: 60.7 kg (133 lb 14.4 oz)    Exam:   General: afebrile, aphasic, in no acute distress. Patient with left  side negligence and inability to follow commands.  Cardiovascular: regular rate, no rubs, no gallops, no murmur  Respiratory: good air movement, no wheezing, no crackles  Abdomen: soft, NT, ND, positive BS  Musculoskeletal: no edema, no cyanosis   Neurology: CN grossly intact, patient is aphasic, intermittently following simple commands, left side neglected.  Data Reviewed: Basic Metabolic Panel:  Recent Labs Lab 06/04/17 1136 06/04/17 1859 06/05/17 0407 06/05/17 0728  NA 136  --  135  --   K 3.9  --  3.1*  --   CL 103  --  102  --   CO2 23  --  23  --   GLUCOSE 114*  --  123*  --   BUN 22*  --  18  --   CREATININE 1.24*  --  1.19*  --   CALCIUM 10.0  --  10.0  --   MG  --  1.6*  --  1.5*  PHOS  --  2.8  --   --    Liver Function Tests:  Recent Labs Lab 06/04/17 1136 06/05/17 0407  AST 18 18  ALT 12* 13*  ALKPHOS 60 64  BILITOT 0.5 0.8  PROT 7.6 7.1  ALBUMIN 4.0 3.7    Recent Labs Lab 06/04/17 1908  AMMONIA 22   CBC:  Recent Labs Lab 06/04/17 1136 06/05/17 0407  WBC 6.9 5.8  NEUTROABS 5.4  --  HGB 12.6 12.1  HCT 38.6 37.1  MCV 82.8 81.9  PLT 218 229    CBG:  Recent Labs Lab 06/04/17 1146  GLUCAP 111*    Studies: Ct Head Wo Contrast  Result Date: 06/04/2017 CLINICAL DATA:  Status post fall. No loss of consciousness. Confused. EXAM: CT HEAD WITHOUT CONTRAST CT CERVICAL SPINE WITHOUT CONTRAST TECHNIQUE: Multidetector CT imaging of the head and cervical spine was performed following the standard protocol without intravenous contrast. Multiplanar CT image reconstructions of the cervical spine were also generated. COMPARISON:  05/01/2017 FINDINGS: CT HEAD FINDINGS Brain: No evidence of acute infarction, hemorrhage, extra-axial collection, ventriculomegaly, or mass effect. Old right frontal lobe infarct with encephalomalacia. Small area of encephalomalacia in the left parietal lobe. Generalized cerebral atrophy. Periventricular white matter low  attenuation likely secondary to microangiopathy. Vascular: Cerebrovascular atherosclerotic calcifications are noted. Skull: Negative for fracture or focal lesion. Sinuses/Orbits: Visualized portions of the orbits are unremarkable. Visualized portions of the paranasal sinuses and mastoid air cells are unremarkable. Other: None. CT CERVICAL SPINE FINDINGS Alignment: Normal. Skull base and vertebrae: No acute fracture. No primary bone lesion or focal pathologic process. Soft tissues and spinal canal: No prevertebral fluid or swelling. No visible canal hematoma. Disc levels: Degenerative disc disease with disc height loss C4-5, C5-6 and C6-7. Mild bilateral uncovertebral degenerative changes at C4-5, C5-6 and C6-7. Mild left foraminal stenosis at C4-5. Mild bilateral foraminal stenosis at C5-6. Mild right foraminal stenosis at C6-7. Upper chest: Lung apices are clear. Other: Bilateral carotid artery atherosclerosis. IMPRESSION: 1. No acute intracranial pathology. 2. No acute osseous injury of the cervical spine. 3. Cervical spine spondylosis as described above. Electronically Signed   By: Elige Ko   On: 06/04/2017 12:19   Ct Cervical Spine Wo Contrast  Result Date: 06/04/2017 CLINICAL DATA:  Status post fall. No loss of consciousness. Confused. EXAM: CT HEAD WITHOUT CONTRAST CT CERVICAL SPINE WITHOUT CONTRAST TECHNIQUE: Multidetector CT imaging of the head and cervical spine was performed following the standard protocol without intravenous contrast. Multiplanar CT image reconstructions of the cervical spine were also generated. COMPARISON:  05/01/2017 FINDINGS: CT HEAD FINDINGS Brain: No evidence of acute infarction, hemorrhage, extra-axial collection, ventriculomegaly, or mass effect. Old right frontal lobe infarct with encephalomalacia. Small area of encephalomalacia in the left parietal lobe. Generalized cerebral atrophy. Periventricular white matter low attenuation likely secondary to microangiopathy.  Vascular: Cerebrovascular atherosclerotic calcifications are noted. Skull: Negative for fracture or focal lesion. Sinuses/Orbits: Visualized portions of the orbits are unremarkable. Visualized portions of the paranasal sinuses and mastoid air cells are unremarkable. Other: None. CT CERVICAL SPINE FINDINGS Alignment: Normal. Skull base and vertebrae: No acute fracture. No primary bone lesion or focal pathologic process. Soft tissues and spinal canal: No prevertebral fluid or swelling. No visible canal hematoma. Disc levels: Degenerative disc disease with disc height loss C4-5, C5-6 and C6-7. Mild bilateral uncovertebral degenerative changes at C4-5, C5-6 and C6-7. Mild left foraminal stenosis at C4-5. Mild bilateral foraminal stenosis at C5-6. Mild right foraminal stenosis at C6-7. Upper chest: Lung apices are clear. Other: Bilateral carotid artery atherosclerosis. IMPRESSION: 1. No acute intracranial pathology. 2. No acute osseous injury of the cervical spine. 3. Cervical spine spondylosis as described above. Electronically Signed   By: Elige Ko   On: 06/04/2017 12:19   Mr Brain Wo Contrast  Result Date: 06/04/2017 CLINICAL DATA:  Aphasia.  Atrial fibrillation. EXAM: MRI HEAD WITHOUT CONTRAST TECHNIQUE: Multiplanar, multiecho pulse sequences of the brain and surrounding structures were obtained  without intravenous contrast. COMPARISON:  Head CT 06/04/2017 FINDINGS: Brain: The midline structures are normal. There is multifocal diffusion restriction including punctate focus in the right cerebellum, larger area in the left frontal operculum and insula and a similar size area of the right cingulum/pericallosal white matter. There are multiple old cerebellar infarcts and bilateral basal ganglia lacunar infarcts. There is right frontal pole encephalomalacia predominantly involving the medial frontal gyrus. There is beginning confluent hyperintense T2-weighted signal within the periventricular white matter, most  often seen in the setting of chronic microvascular ischemia. No intraparenchymal hematoma or chronic microhemorrhage. Atrophy is greater than expected for age without clear lobar predominance. The dura is normal and there is no extra-axial collection. Vascular: Major intracranial arterial and venous sinus flow voids are preserved. Skull and upper cervical spine: The Sinuses/Orbits: Left maxillary sinus mucosal thickening. Bilateral lens replacements. IMPRESSION: 1. Multifocal acute ischemia with the largest areas involving the right pericallosal white matter and left frontal operculum/and slow with a punctate focus of ischemia in the right cerebellum. 2. No hemorrhage or mass effect. 3. Old right frontal infarct and multiple small old cerebellar infarcts superimposed on chronic microvascular ischemia. Electronically Signed   By: Deatra Robinson M.D.   On: 06/04/2017 17:26    Scheduled Meds: . apixaban  5 mg Oral BID  . atorvastatin  20 mg Oral q1800  . carvedilol  25 mg Oral BID WC  . FLUoxetine  40 mg Oral Daily  . losartan  100 mg Oral Daily  . mirtazapine  15 mg Oral QHS  . sodium chloride flush  3 mL Intravenous Q12H  . verapamil  180 mg Oral BID   Continuous Infusions:  Principal Problem:   Fall at home Active Problems:   Uncontrolled hypertension   Altered mental status   History of CVA in adulthood   AF (paroxysmal atrial fibrillation) (HCC)   CKD (chronic kidney disease), stage III   Depression    Time spent: 25 minutes    Vassie Loll  [facility] Pager [phone]. If 7PM-7AM, please contact night-coverage at [email or web address], password [facility] 06/05/2017, 10:52 PM  LOS: 1 day

## 2017-06-05 NOTE — Telephone Encounter (Signed)
Pt. Daughter came to facility to drop off P[employer] forms to be filled out by provider. PT. Daughter was told that it would take 7-14 business days to be filled out. Paperwork will be put in providers box. Please f/u

## 2017-06-06 ENCOUNTER — Inpatient Hospital Stay (HOSPITAL_COMMUNITY): Payer: Medicare Other

## 2017-06-06 ENCOUNTER — Inpatient Hospital Stay (HOSPITAL_COMMUNITY)
Admission: RE | Admit: 2017-06-06 | Discharge: 2017-06-24 | DRG: 057 | Disposition: A | Discharge status: Home Health | Payer: Medicare Other | Source: Intra-hospital | Attending: Physical Medicine & Rehabilitation | Admitting: Physical Medicine & Rehabilitation

## 2017-06-06 ENCOUNTER — Encounter (HOSPITAL_COMMUNITY): Payer: Self-pay

## 2017-06-06 DIAGNOSIS — I639 Cerebral infarction, unspecified: Secondary | ICD-10-CM | POA: Diagnosis not present

## 2017-06-06 DIAGNOSIS — Z7901 Long term (current) use of anticoagulants: Secondary | ICD-10-CM

## 2017-06-06 DIAGNOSIS — I480 Paroxysmal atrial fibrillation: Secondary | ICD-10-CM | POA: Diagnosis present

## 2017-06-06 DIAGNOSIS — Z90710 Acquired absence of both cervix and uterus: Secondary | ICD-10-CM

## 2017-06-06 DIAGNOSIS — B964 Proteus (mirabilis) (morganii) as the cause of diseases classified elsewhere: Secondary | ICD-10-CM | POA: Diagnosis present

## 2017-06-06 DIAGNOSIS — W19XXXS Unspecified fall, sequela: Secondary | ICD-10-CM

## 2017-06-06 DIAGNOSIS — I360 Nonrheumatic tricuspid (valve) stenosis: Secondary | ICD-10-CM | POA: Diagnosis not present

## 2017-06-06 DIAGNOSIS — G253 Myoclonus: Secondary | ICD-10-CM | POA: Diagnosis present

## 2017-06-06 DIAGNOSIS — Z87891 Personal history of nicotine dependence: Secondary | ICD-10-CM | POA: Diagnosis not present

## 2017-06-06 DIAGNOSIS — I10 Essential (primary) hypertension: Secondary | ICD-10-CM | POA: Diagnosis present

## 2017-06-06 DIAGNOSIS — W19XXXD Unspecified fall, subsequent encounter: Secondary | ICD-10-CM | POA: Diagnosis not present

## 2017-06-06 DIAGNOSIS — Z79899 Other long term (current) drug therapy: Secondary | ICD-10-CM

## 2017-06-06 DIAGNOSIS — N390 Urinary tract infection, site not specified: Secondary | ICD-10-CM

## 2017-06-06 DIAGNOSIS — R253 Fasciculation: Secondary | ICD-10-CM

## 2017-06-06 DIAGNOSIS — E785 Hyperlipidemia, unspecified: Secondary | ICD-10-CM | POA: Diagnosis not present

## 2017-06-06 DIAGNOSIS — R4701 Aphasia: Secondary | ICD-10-CM | POA: Diagnosis not present

## 2017-06-06 DIAGNOSIS — I6340 Cerebral infarction due to embolism of unspecified cerebral artery: Secondary | ICD-10-CM

## 2017-06-06 DIAGNOSIS — I69351 Hemiplegia and hemiparesis following cerebral infarction affecting right dominant side: Principal | ICD-10-CM

## 2017-06-06 DIAGNOSIS — I169 Hypertensive crisis, unspecified: Secondary | ICD-10-CM | POA: Diagnosis not present

## 2017-06-06 DIAGNOSIS — R001 Bradycardia, unspecified: Secondary | ICD-10-CM | POA: Diagnosis not present

## 2017-06-06 DIAGNOSIS — N183 Chronic kidney disease, stage 3 (moderate): Secondary | ICD-10-CM | POA: Diagnosis not present

## 2017-06-06 DIAGNOSIS — I6930 Unspecified sequelae of cerebral infarction: Secondary | ICD-10-CM

## 2017-06-06 DIAGNOSIS — R6889 Other general symptoms and signs: Secondary | ICD-10-CM

## 2017-06-06 DIAGNOSIS — L89152 Pressure ulcer of sacral region, stage 2: Secondary | ICD-10-CM | POA: Diagnosis not present

## 2017-06-06 DIAGNOSIS — I638 Other cerebral infarction: Secondary | ICD-10-CM | POA: Diagnosis not present

## 2017-06-06 DIAGNOSIS — F329 Major depressive disorder, single episode, unspecified: Secondary | ICD-10-CM | POA: Diagnosis not present

## 2017-06-06 DIAGNOSIS — R41841 Cognitive communication deficit: Secondary | ICD-10-CM

## 2017-06-06 LAB — BASIC METABOLIC PANEL
ANION GAP: 9 (ref 5–15)
BUN: 26 mg/dL — ABNORMAL HIGH (ref 6–20)
CHLORIDE: 103 mmol/L (ref 101–111)
CO2: 23 mmol/L (ref 22–32)
CREATININE: 1.95 mg/dL — AB (ref 0.44–1.00)
Calcium: 9.5 mg/dL (ref 8.9–10.3)
GFR calc non Af Amer: 25 mL/min — ABNORMAL LOW (ref >60)
GFR, EST AFRICAN AMERICAN: 29 mL/min — AB (ref >60)
Glucose, Bld: 121 mg/dL — ABNORMAL HIGH (ref 65–99)
POTASSIUM: 3.5 mmol/L (ref 3.5–5.1)
SODIUM: 135 mmol/L (ref 135–145)

## 2017-06-06 LAB — URINE CULTURE: Culture: NO GROWTH

## 2017-06-06 LAB — ECHOCARDIOGRAM COMPLETE: WEIGHTICAEL: 2142.4 [oz_av]

## 2017-06-06 LAB — MAGNESIUM: MAGNESIUM: 2.2 mg/dL (ref 1.7–2.4)

## 2017-06-06 MED ORDER — ONDANSETRON HCL 4 MG/2ML IJ SOLN
4.0000 mg | Freq: Four times a day (QID) | INTRAMUSCULAR | Status: DC | PRN
  Administered 2017-06-13: 4 mg via INTRAVENOUS
  Filled 2017-06-06: qty 2

## 2017-06-06 MED ORDER — ISOSORB DINITRATE-HYDRALAZINE 20-37.5 MG PO TABS: 2.0000 | ORAL_TABLET | Freq: Two times a day (BID) | ORAL | Status: DC

## 2017-06-06 MED ORDER — MIRTAZAPINE 15 MG PO TBDP
15.0000 mg | ORAL_TABLET | Freq: Every day | ORAL | Status: DC
  Administered 2017-06-06 – 2017-06-07 (×2): 15 mg via ORAL
  Filled 2017-06-06 (×2): qty 1

## 2017-06-06 MED ORDER — LOSARTAN POTASSIUM 50 MG PO TABS
100.0000 mg | ORAL_TABLET | Freq: Every day | ORAL | Status: DC
  Administered 2017-06-07 – 2017-06-12 (×6): 100 mg via ORAL
  Filled 2017-06-06 (×6): qty 2

## 2017-06-06 MED ORDER — CARVEDILOL 25 MG PO TABS
25.0000 mg | ORAL_TABLET | Freq: Two times a day (BID) | ORAL | Status: DC
  Administered 2017-06-07 – 2017-06-24 (×35): 25 mg via ORAL
  Filled 2017-06-06 (×35): qty 1

## 2017-06-06 MED ORDER — ACETAMINOPHEN 325 MG PO TABS
650.0000 mg | ORAL_TABLET | Freq: Four times a day (QID) | ORAL | Status: DC | PRN
  Administered 2017-06-08 – 2017-06-23 (×7): 650 mg via ORAL
  Filled 2017-06-06 (×6): qty 2

## 2017-06-06 MED ORDER — VERAPAMIL HCL ER 180 MG PO TBCR
180.0000 mg | EXTENDED_RELEASE_TABLET | Freq: Two times a day (BID) | ORAL | Status: DC
  Administered 2017-06-06 – 2017-06-08 (×4): 180 mg via ORAL
  Filled 2017-06-06 (×6): qty 1

## 2017-06-06 MED ORDER — ONDANSETRON HCL 4 MG PO TABS: 4.0000 mg | ORAL_TABLET | Freq: Four times a day (QID) | ORAL | Status: DC | PRN

## 2017-06-06 MED ORDER — ATORVASTATIN CALCIUM 20 MG PO TABS
20.0000 mg | ORAL_TABLET | Freq: Every day | ORAL | Status: DC
  Administered 2017-06-07 – 2017-06-23 (×17): 20 mg via ORAL
  Filled 2017-06-06 (×17): qty 1

## 2017-06-06 MED ORDER — FLUOXETINE HCL 20 MG PO CAPS
40.0000 mg | ORAL_CAPSULE | Freq: Every day | ORAL | Status: DC
  Administered 2017-06-07 – 2017-06-09 (×3): 40 mg via ORAL
  Filled 2017-06-06 (×3): qty 2

## 2017-06-06 MED ORDER — ACETAMINOPHEN 650 MG RE SUPP: 650.0000 mg | Freq: Four times a day (QID) | RECTAL | Status: DC | PRN

## 2017-06-06 MED ORDER — SORBITOL 70 % SOLN: 30.0000 mL | Freq: Every day | Status: DC | PRN

## 2017-06-06 MED ORDER — APIXABAN 5 MG PO TABS
5.0000 mg | ORAL_TABLET | Freq: Two times a day (BID) | ORAL | Status: DC
  Administered 2017-06-06 – 2017-06-24 (×36): 5 mg via ORAL
  Filled 2017-06-06 (×35): qty 1

## 2017-06-06 MED ORDER — ATORVASTATIN CALCIUM 20 MG PO TABS: 20.0000 mg | ORAL_TABLET | Freq: Every day | ORAL | Status: DC

## 2017-06-06 NOTE — Progress Notes (Signed)
STROKE TEAM PROGRESS NOTE   SUBJECTIVE (INTERVAL HISTORY) Patient up on BSC. Her daughter is present beside her. When questioned, her dtr states she administers all meds and none have been missed. dtr does, however, admit to being sick last week and she can't be sure her mother received all her medications or at the appropriate time. dtr is agreeable she may have missed some eliquis. (all presented in a non-threatening manner. Aided discussion with non-punative approach with pts best interest in mind in considering possible mediation adjustment)   OBJECTIVE Temp:  [98.5 F (36.9 C)-99.3 F (37.4 C)] 98.5 F (36.9 C) (06/15 0508) Pulse Rate:  [70-88] 79 (06/15 0508) Cardiac Rhythm: Normal sinus rhythm (06/15 0700) Resp:  [18-20] 20 (06/15 0508) BP: (131-174)/(57-84) 174/84 (06/15 0508) SpO2:  [96 %-100 %] 100 % (06/15 0508)  CBC:  Recent Labs Lab 06/04/17 1136 06/05/17 0407  WBC 6.9 5.8  NEUTROABS 5.4  --   HGB 12.6 12.1  HCT 38.6 37.1  MCV 82.8 81.9  PLT 218 229    Basic Metabolic Panel:  Recent Labs Lab 06/04/17 1859 06/05/17 0407 06/05/17 0728 06/06/17 0635  NA  --  135  --  135  K  --  3.1*  --  3.5  CL  --  102  --  103  CO2  --  23  --  23  GLUCOSE  --  123*  --  121*  BUN  --  18  --  26*  CREATININE  --  1.19*  --  1.95*  CALCIUM  --  10.0  --  9.5  MG 1.6*  --  1.5* 2.2  PHOS 2.8  --   --   --     Lipid Panel:    Component Value Date/Time   CHOL 166 05/08/2017 1026   TRIG 182 (H) 05/08/2017 1026   HDL 46 05/08/2017 1026   CHOLHDL 3.6 05/08/2017 1026   CHOLHDL 3.9 03/15/2017 0725   VLDL 35 03/15/2017 0725   LDLCALC 84 05/08/2017 1026   HgbA1c:  Lab Results  Component Value Date   HGBA1C 5.7 (H) 03/15/2017   Urine Drug Screen:    Component Value Date/Time   LABOPIA NONE DETECTED 06/05/2017 0202   COCAINSCRNUR NONE DETECTED 06/05/2017 0202   LABBENZ NONE DETECTED 06/05/2017 0202   AMPHETMU NONE DETECTED 06/05/2017 0202   THCU NONE DETECTED  06/05/2017 0202   LABBARB NONE DETECTED 06/05/2017 0202    Alcohol Level     Component Value Date/Time   ETH <5 05/27/2017 1053    IMAGING  Ct Head Wo Contrast 06/04/2017 1. No acute intracranial pathology.   Ct Cervical Spine Wo Contrast 06/04/2017 2. No acute osseous injury of the cervical spine. 3. Cervical spine spondylosis  Mr Brain Wo Contrast 06/04/2017 1. Multifocal acute ischemia with the largest areas involving the right pericallosal white matter and left frontal operculum/and slow with a punctate focus of ischemia in the right cerebellum. 2. No hemorrhage or mass effect. 3. Old right frontal infarct and multiple small old cerebellar infarcts superimposed on chronic microvascular ischemia.   2D Echocardiogram  - Left ventricle: Wall thickness was increased in a pattern of mild LVH. Systolic function was normal. The estimated ejection fraction was in the range of 60% to 65%. Doppler parameters are consistent with abnormal left ventricular relaxation (grade 1 diastolic dysfunction). - Mitral valve: Calcified annulus. Mildly thickened leaflets. Valve area by pressure half-time: 2.29 cm^2. - Left atrium: The atrium was mildly dilated. -  Atrial septum: No defect or patent foramen ovale was identified.   PHYSICAL EXAM  PHYSICAL EXAM Physical exam: Exam: Gen: NAD, non-verbal Eyes: anicteric sclerae, moist conjunctivae                    CV: no MRG, no carotid bruits, no peripheral edema Mental Status: Alert, follows simple commands with visual cues  Neuro: Detailed Neurologic Exam  Speech:    No aphasia, no dysarthria  Cranial Nerves:    The pupils are equal, round, and reactive to light.. Attempted, Fundi not visualized.  EOMI. No gaze preference. Visual fields full. Face symmetric, Tongue midline. Hearing intact to voice. Shoulder shrug intact  Motor Observation:    Difficult exam due to cognition, moves all extremities symmetrically and anti-gravity     Strength:    5/5     Sensation:  Withdraws to pain/stim in all extremities  Plantars upgoing.    ASSESSMENT/PLAN [name] is a 71 y.o. female from [place] with history of prior stroke, AF on eliquis, HTN, HLD, recurrent cystitis and depression presenting with non-verbal post fall. MRI showed bilateral anterior and posterior circulation infarcts.  She did not receive IV t-PA due to delay in arrival, on eliquis.   Stroke:   bilateral anterior and posterior circulation infarcts embolic secondary to known atrial fibrillation with possible missed eliquis doses last week small vessel disease source  CT head no acute abnormality  CT Cspine no injury. Spondylosis.  MRI head bilateral anterior and posterior circulation infarcts  2D Echo  EF 60-65%. No source of embolus   LDL not drawn (already on statin, checked last month)  HgbA1c 5.7 in March  eliquis for VTE prophylaxis  Diet regular Room service appropriate? Yes; Fluid consistency: Thin  Eliquis (apixaban) daily prior to admission, now on Eliquis (apixaban) daily  Patient counseled to be compliant with her antithrombotic medications  Ongoing aggressive stroke risk factor management  Therapy recommendations:  CIR  Disposition:  pending   Atrial Fibrillation  Home anticoagulation:  Eliquis (apixaban) daily   Daughter does admit that she may have not given on schedule last week due to illnes  Will not change eliquis  Continue eliquis at discharge   Hypertension  Stable  Permissive hypertension (OK if < 220/120) but gradually normalize in 5-7 days  Long-term BP goal normotensive  Hyperlipidemia  Home meds:  zocor 40, resumed in hospital  LDL not checked (already on statin, checked last month)  Continue statin at discharge  Other Stroke Risk Factors  Advanced age  Former Cigarette smoker  Hx stroke/TIA  02/2017 - Left MCA, right thalamus, right ACA infarct, embolic pattern - source  unclear.  Other Active Problems  CDK stage III  L knee OA  Hypokalemia  hypomagnesemia  depression  Hospital day # 2  Personally examined patient and images, and have participated in and made any corrections needed to history, physical, neuro exam,assessment and plan as stated above.  I have personally obtained the history, evaluated lab date, reviewed imaging studies and agree with radiology interpretations.   Stroke team will sign off at this time.  Christina DeanAntonia Ahern, MD Stroke Neurology     To contact Stroke Continuity provider, please refer to [email or web address]. After hours, contact General Neurology

## 2017-06-06 NOTE — H&P (Signed)
Physical Medicine and Rehabilitation Admission H&P    Chief Complaint  Patient presents with  . Fall  : HPI: Christina [name] is a 71 y.o. 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 Present 06/04/2017 with mental status/non- as well as recent fall without loss of consciousness. CT/MRI showed multifocal acute ischemia within the largest area involving the right pericallosal white matter and left frontal operculum. No hemorrhage or mass effect. Old right frontal infarct and multiple small old cerebellar infarcts. Echocardiogram with ejection fraction of 35% grade 1 diastolic dysfunction . Patient did not receive TPA. Patient currently remains on Eliquis. Tolerating a regular diet. Physical therapy evaluation completed with recommendations of physical medicine rehabilitation consult.Patient was admitted for a comprehensive rehabilitation program  Review of Systems  Unable to perform ROS: Language   Past Medical History:  Diagnosis Date  . Hypertension   . PAF (paroxysmal atrial fibrillation) ([place])   . Stroke [facility])    Past Surgical History:  Procedure Laterality Date  . ABDOMINAL HYSTERECTOMY    . EYE SURGERY     Family History  Problem Relation Age of Onset  . Heart disease Mother   . Diabetes Mother   . Heart disease Father   . Heart disease Sister   . Heart disease Sister    Social History:  reports that she has quit smoking. She has never used smokeless tobacco. She reports that she does not drink alcohol or use drugs. Allergies: No Known Allergies Medications Prior to Admission  Medication Sig Dispense Refill  . acetaminophen (TYLENOL) 500 MG tablet Take 2 tablets (1,000 mg total) by  mouth every 6 (six) hours as needed. 30 tablet 0  . apixaban (ELIQUIS) 5 MG TABS tablet Take 1 tablet (5 mg total) by mouth 2 (two) times daily. 60 tablet 2  . carvedilol (COREG) 25 MG tablet Take 1 tablet (25 mg total) by mouth 2 (two) times daily with a meal. 60 tablet 2  . cephALEXin (KEFLEX) 500 MG capsule Take 2 capsules (1,000 mg total) by mouth 2 (two) times daily. 28 capsule 0  . FLUoxetine (PROZAC) 40 MG capsule Take 1 capsule (40 mg total) by mouth daily. 30 capsule 2  . hydrochlorothiazide (HYDRODIURIL) 25 MG tablet Take 1 tablet (25 mg total) by mouth daily. 30 tablet 1  . latanoprost (XALATAN) 0.005 % ophthalmic solution Place 1 drop into both eyes every morning. 2.5 mL 12  . lidocaine (LIDODERM) 5 % Place 1 patch onto the skin daily. Remove & Discard patch within 12 hours or as directed by MD 30 patch 0  . losartan (COZAAR) 100 MG tablet Take 1 tablet (100 mg total) by mouth daily. 30 tablet 1  . mirtazapine (REMERON SOL-TAB) 15 MG disintegrating tablet Take 1 tablet (15 mg total) by mouth at bedtime. 30 tablet 2  . nitrofurantoin, macrocrystal-monohydrate, (MACROBID) 100 MG capsule Take 1 capsule (100 mg total) by mouth 2 (two) times daily. 10 capsule 0  . potassium chloride SA (K-DUR,KLOR-CON) 20 MEQ tablet Take 1 tablet (20 mEq total) by mouth daily. 30 tablet 1  . senna-docusate (SENOKOT-S) 8.6-50 MG tablet Take 1 tablet by mouth at bedtime as needed for moderate constipation. (Patient taking differently: Take 1 tablet by mouth at bedtime as needed for moderate constipation. )  30 tablet 1  . simvastatin (ZOCOR) 40 MG tablet Take 1 tablet (40 mg total) by mouth daily. 90 tablet 0  . traMADol (ULTRAM) 50 MG tablet Take 2 tablets (100 mg total) by mouth every 12 (twelve) hours as needed for moderate pain. 20 tablet 0  . verapamil (CALAN-SR) 180 MG CR tablet Take 1 tablet (180 mg total) by mouth 2 (two) times daily. 60 tablet 2  . VOLTAREN 1 % GEL Apply 2 g topically 4 (four) times  daily. 100 g 1    Home: Home Living Family/patient expects to be discharged to:: Private residence Living Arrangements: Children Available Help at Discharge: Family, Available 24 hours/day Type of Home: House Home Access: Level entry Home Layout: One level Bathroom Shower/Tub: Tub/shower unit Home Equipment: Environmental consultant - 2 wheels, Bedside commode   Functional History: Prior Function Level of Independence: Independent with assistive device(s) Gait / Transfers Assistance Needed: pt's daughter reported that she was ambulating with use of RW ADL's / Homemaking Assistance Needed: independent (per daughter)  Functional Status:  Mobility: Bed Mobility Overal bed mobility: Needs Assistance Bed Mobility: Supine to Sit Supine to sit: Mod assist General bed mobility comments: increased time and effort, mod A to achieve sitting EOB Transfers Overall transfer level: Needs assistance Equipment used: 2 person hand held assist Transfers: Sit to/from Stand, Stand Pivot Transfers Sit to Stand: Max assist Stand pivot transfers: Max assist General transfer comment: increased time and effort, verbal and tactile cueing for sequencing, max A to rise into standing from sitting EOB and max A with pivotal movements from bed<>BSC<>recliner      ADL:    Cognition: Cognition Overall Cognitive Status: Difficult to assess Orientation Level: Other (comment) (patient aphasic) Cognition Arousal/Alertness: Awake/alert Behavior During Therapy: Flat affect Overall Cognitive Status: Difficult to assess General Comments: pt did no respond verbally or non-verbally to any questions or commands Difficult to assess due to: Impaired communication  Physical Exam: Blood pressure (!) 168/74, pulse 71, temperature 98.6 F (37 C), temperature source Oral, resp. rate 18, weight 60.7 kg (133 lb 14.4 oz), SpO2 100 %. Physical Exam HENT:  Healing abrasions to the nose and face  Eyes:  Pupils round and reactive to  light . No nystagmus Neck: Normal range of motion. Neck supple. No thyromegaly present.  Cardiovascular:  Cardiac rate controlled. No murmur Respiratory: Effort normal and breath sounds normal without wheeze. No respiratory distress.  GI: Soft/nontender. Bowel sounds are normal. She exhibits no distension.  Neurological: She is alert.  Patient is nonverbal. Expressive greater than receptive language deficits. Makes eye contact. Followed some simple commands but perseverative. Had hard time shifting between tasks. Moves all 4's left more than right but inconsistent due to language/comprehension. Seems to sense pain in all 4's.  Psych: flat, distracted   Results for orders placed or performed during the hospital encounter of 06/04/17 (from the past 48 hour(s))  TSH     Status: None   Collection Time: 06/04/17  6:59 PM  Result Value Ref Range   TSH 1.623 0.350 - 4.500 uIU/mL    Comment: Performed by a 3rd Generation assay with a functional sensitivity of <=0.01 uIU/mL.  RPR     Status: None   Collection Time: 06/04/17  6:59 PM  Result Value Ref Range   RPR Ser Ql Non Reactive Non Reactive    Comment: (NOTE) Performed At: [facility] [address] [place], [place] [id number] Lindon Romp MD [id number]   Vitamin B12  Status: None   Collection Time: 06/04/17  6:59 PM  Result Value Ref Range   Vitamin B-12 841 180 - 914 pg/mL    Comment: (NOTE) This assay is not validated for testing neonatal or myeloproliferative syndrome specimens for Vitamin B12 levels.   Folate     Status: None   Collection Time: 06/04/17  6:59 PM  Result Value Ref Range   Folate 14.2 >5.9 ng/mL  Iron and TIBC     Status: Abnormal   Collection Time: 06/04/17  6:59 PM  Result Value Ref Range   Iron 33 28 - 170 ug/dL   TIBC 328 250 - 450 ug/dL   Saturation Ratios 10 (L) 10.4 - 31.8 %   UIBC 295 ug/dL  Ferritin     Status: None   Collection Time: 06/04/17  6:59 PM  Result Value Ref Range     Ferritin 62 11 - 307 ng/mL  Reticulocytes     Status: None   Collection Time: 06/04/17  6:59 PM  Result Value Ref Range   Retic Ct Pct 0.9 0.4 - 3.1 %   RBC. 4.25 3.87 - 5.11 MIL/uL   Retic Count, Manual 38.3 19.0 - 186.0 K/uL  HIV antibody     Status: None   Collection Time: 06/04/17  6:59 PM  Result Value Ref Range   HIV Screen 4th Generation wRfx Non Reactive Non Reactive    Comment: (NOTE) Performed At: [facility] [place], [place] [id number] Lindon Romp MD [id number]   Magnesium     Status: Abnormal   Collection Time: 06/04/17  6:59 PM  Result Value Ref Range   Magnesium 1.6 (L) 1.7 - 2.4 mg/dL  Phosphorus     Status: None   Collection Time: 06/04/17  6:59 PM  Result Value Ref Range   Phosphorus 2.8 2.5 - 4.6 mg/dL  Ammonia     Status: None   Collection Time: 06/04/17  7:08 PM  Result Value Ref Range   Ammonia 22 9 - 35 umol/L  Urinalysis, Routine w reflex microscopic     Status: Abnormal   Collection Time: 06/05/17  2:02 AM  Result Value Ref Range   Color, Urine YELLOW YELLOW   APPearance CLEAR CLEAR   Specific Gravity, Urine 1.011 1.005 - 1.030   pH 7.0 5.0 - 8.0   Glucose, UA NEGATIVE NEGATIVE mg/dL   Hgb urine dipstick NEGATIVE NEGATIVE   Bilirubin Urine NEGATIVE NEGATIVE   Ketones, ur NEGATIVE NEGATIVE mg/dL   Protein, ur NEGATIVE NEGATIVE mg/dL   Nitrite NEGATIVE NEGATIVE   Leukocytes, UA TRACE (A) NEGATIVE   RBC / HPF 0-5 0 - 5 RBC/hpf   WBC, UA 6-30 0 - 5 WBC/hpf   Bacteria, UA NONE SEEN NONE SEEN   Squamous Epithelial / LPF NONE SEEN NONE SEEN   Hyaline Casts, UA PRESENT   Culture, Urine     Status: None   Collection Time: 06/05/17  2:02 AM  Result Value Ref Range   Specimen Description URINE, CATHETERIZED    Special Requests NONE    Culture NO GROWTH    Report Status 06/06/2017 FINAL   Urine rapid drug screen (hosp performed)     Status: None   Collection Time: 06/05/17  2:02 AM  Result Value Ref Range    Opiates NONE DETECTED NONE DETECTED   Cocaine NONE DETECTED NONE DETECTED   Benzodiazepines NONE DETECTED NONE DETECTED   Amphetamines NONE DETECTED NONE DETECTED   Tetrahydrocannabinol NONE DETECTED  NONE DETECTED   Barbiturates NONE DETECTED NONE DETECTED    Comment:        DRUG SCREEN FOR MEDICAL PURPOSES ONLY.  IF CONFIRMATION IS NEEDED FOR ANY PURPOSE, NOTIFY LAB WITHIN 5 DAYS.        LOWEST DETECTABLE LIMITS FOR URINE DRUG SCREEN Drug Class       Cutoff (ng/mL) Amphetamine      1000 Barbiturate      200 Benzodiazepine   481 Tricyclics       856 Opiates          300 Cocaine          300 THC              50   Comprehensive metabolic panel     Status: Abnormal   Collection Time: 06/05/17  4:07 AM  Result Value Ref Range   Sodium 135 135 - 145 mmol/L   Potassium 3.1 (L) 3.5 - 5.1 mmol/L    Comment: NO VISIBLE HEMOLYSIS   Chloride 102 101 - 111 mmol/L   CO2 23 22 - 32 mmol/L   Glucose, Bld 123 (H) 65 - 99 mg/dL   BUN 18 6 - 20 mg/dL   Creatinine, Ser 1.19 (H) 0.44 - 1.00 mg/dL   Calcium 10.0 8.9 - 10.3 mg/dL   Total Protein 7.1 6.5 - 8.1 g/dL   Albumin 3.7 3.5 - 5.0 g/dL   AST 18 15 - 41 U/L   ALT 13 (L) 14 - 54 U/L   Alkaline Phosphatase 64 38 - 126 U/L   Total Bilirubin 0.8 0.3 - 1.2 mg/dL   GFR calc non Af Amer 45 (L) >60 mL/min   GFR calc Af Amer 52 (L) >60 mL/min    Comment: (NOTE) The eGFR has been calculated using the CKD EPI equation. This calculation has not been validated in all clinical situations. eGFR's persistently <60 mL/min signify possible Chronic Kidney Disease.    Anion gap 10 5 - 15  CBC     Status: None   Collection Time: 06/05/17  4:07 AM  Result Value Ref Range   WBC 5.8 4.0 - 10.5 K/uL   RBC 4.53 3.87 - 5.11 MIL/uL   Hemoglobin 12.1 12.0 - 15.0 g/dL   HCT 37.1 36.0 - 46.0 %   MCV 81.9 78.0 - 100.0 fL   MCH 26.7 26.0 - 34.0 pg   MCHC 32.6 30.0 - 36.0 g/dL   RDW 12.9 11.5 - 15.5 %   Platelets 229 150 - 400 K/uL  Magnesium     Status:  Abnormal   Collection Time: 06/05/17  7:28 AM  Result Value Ref Range   Magnesium 1.5 (L) 1.7 - 2.4 mg/dL  Basic metabolic panel     Status: Abnormal   Collection Time: 06/06/17  6:35 AM  Result Value Ref Range   Sodium 135 135 - 145 mmol/L   Potassium 3.5 3.5 - 5.1 mmol/L   Chloride 103 101 - 111 mmol/L   CO2 23 22 - 32 mmol/L   Glucose, Bld 121 (H) 65 - 99 mg/dL   BUN 26 (H) 6 - 20 mg/dL   Creatinine, Ser 1.95 (H) 0.44 - 1.00 mg/dL   Calcium 9.5 8.9 - 10.3 mg/dL   GFR calc non Af Amer 25 (L) >60 mL/min   GFR calc Af Amer 29 (L) >60 mL/min    Comment: (NOTE) The eGFR has been calculated using the CKD EPI equation. This calculation has not been validated  in all clinical situations. eGFR's persistently <60 mL/min signify possible Chronic Kidney Disease.    Anion gap 9 5 - 15  Magnesium     Status: None   Collection Time: 06/06/17  6:35 AM  Result Value Ref Range   Magnesium 2.2 1.7 - 2.4 mg/dL   Mr Brain Wo Contrast  Result Date: 06/04/2017 CLINICAL DATA:  Aphasia.  Atrial fibrillation. EXAM: MRI HEAD WITHOUT CONTRAST TECHNIQUE: Multiplanar, multiecho pulse sequences of the brain and surrounding structures were obtained without intravenous contrast. COMPARISON:  Head CT 06/04/2017 FINDINGS: Brain: The midline structures are normal. There is multifocal diffusion restriction including punctate focus in the right cerebellum, larger area in the left frontal operculum and insula and a similar size area of the right cingulum/pericallosal white matter. There are multiple old cerebellar infarcts and bilateral basal ganglia lacunar infarcts. There is right frontal pole encephalomalacia predominantly involving the medial frontal gyrus. There is beginning confluent hyperintense T2-weighted signal within the periventricular white matter, most often seen in the setting of chronic microvascular ischemia. No intraparenchymal hematoma or chronic microhemorrhage. Atrophy is greater than expected for age  without clear lobar predominance. The dura is normal and there is no extra-axial collection. Vascular: Major intracranial arterial and venous sinus flow voids are preserved. Skull and upper cervical spine: The Sinuses/Orbits: Left maxillary sinus mucosal thickening. Bilateral lens replacements. IMPRESSION: 1. Multifocal acute ischemia with the largest areas involving the right pericallosal white matter and left frontal operculum/and slow with a punctate focus of ischemia in the right cerebellum. 2. No hemorrhage or mass effect. 3. Old right frontal infarct and multiple small old cerebellar infarcts superimposed on chronic microvascular ischemia. Electronically Signed   By: Ulyses Jarred M.D.   On: 06/04/2017 17:26       Medical Problem List and Plan: 1. Right-sided weakness with aphasia  secondary to left pericallosal frontal operculum infarct as well as recent left MCA infarct March 2018  -admit to inpatient rehab 2.  DVT Prophylaxis/Anticoagulation: Eliquis 3. Pain Management: Tylenol 4. Mood: Prozac 40 mg daily, Remeron 15 mg daily at bedtime  -team to provide egosupport as cognitively/linguistically appropriate  -family supportive as well 5. Neuropsych: This patient is  capable of making decisions on her own behalf. 6. Skin/Wound Care: Routine skin checks 7. Fluids/Electrolytes/Nutrition: Routine I&O's with follow-up chemistries upon admit 8.PAF. Cardiac rate control. Continue Eliquis 9. Hypertension.Coreg 25 mg BID,Cozaar 100 mg daily, verapamil 180 mg twice a day 10. Hyperlipidemia. Lipitor    Post Admission Physician Evaluation: 1. Functional deficits secondary  to left CVA. 2. Patient is admitted to receive collaborative, interdisciplinary care between the physiatrist, rehab nursing staff, and therapy team. 3. Patient's level of medical complexity and substantial therapy needs in context of that medical necessity cannot be provided at a lesser intensity of care such as a  SNF. 4. Patient has experienced substantial functional loss from his/her baseline which was documented above under the "Functional History" and "Functional Status" headings.  Judging by the patient's diagnosis, physical exam, and functional history, the patient has potential for functional progress which will result in measurable gains while on inpatient rehab.  These gains will be of substantial and practical use upon discharge  in facilitating mobility and self-care at the household level. 5. Physiatrist will provide 24 hour management of medical needs as well as oversight of the therapy plan/treatment and provide guidance as appropriate regarding the interaction of the two. 6. The Preadmission Screening has been reviewed and patient status is unchanged unless  otherwise stated above. 7. 24 hour rehab nursing will assist with bladder management, bowel management, safety, skin/wound care, disease management, medication administration, pain management and patient education  and help integrate therapy concepts, techniques,education, etc. 8. PT will assess and treat for/with: Lower extremity strength, range of motion, stamina, balance, functional mobility, safety, adaptive techniques and equipment, NMR, family education, cognitive perceptual rx.   Goals are: min assist to supervision. 9. OT will assess and treat for/with: ADL's, functional mobility, safety, upper extremity strength, adaptive techniques and equipment, NMR, family ed, cognitive-perceptual rx,.   Goals are: min assist to supervision. Therapy may proceed with showering this patient. 10. SLP will assess and treat for/with: cognition, communication/language, family ed.  Goals are: min to mod assist. 11. Case Management and Social Worker will assess and treat for psychological issues and discharge planning. 12. Team conference will be held weekly to assess progress toward goals and to determine barriers to discharge. 13. Patient will receive at  least 3 hours of therapy per day at least 5 days per week. 14. ELOS: 15-20 days       15. Prognosis:  excellent     Meredith Staggers, MD, [facility] 06/06/2017  Cathlyn Parsons., PA-C 06/06/2017

## 2017-06-06 NOTE — PMR Pre-admission (Signed)
PMR Admission Coordinator Pre-Admission Assessment  Patient: Christina [name] is an 71 y.o., female MRN: [id number] DOB: [birth year] Height:   Weight: 60.7 kg (133 lb 14.4 oz)              Insurance Information HMO: No    PPO:       PCP:       IPA:       80/20:       OTHER:   PRIMARY:  Medicare part B only      Policy#: [id number]      Subscriber: Netty Ferrington CM Name:        Phone#:       Fax#:   Pre-Cert#:        Employer: Not employed Benefits:  Phone #:       Name: Checked in [employer]. Date: 03/23/17     Deduct:        Out of Pocket Max:        Life Max:   CIR:        SNF:   Outpatient:       Co-Pay:   Home Health:        Co-Pay:   DME:       Co-Pay:   Providers:    SECONDARY: Medicaid of [place]       Policy#: [id number]      Subscriber: [name] CM Name:        Phone#:       Fax#:   Pre-Cert#:        Employer: Not employed Benefits:  Phone #: [phone]     Name:  Automated Eff. 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 Present 06/04/2017 with mental status/non- as well as recent fall without loss of consciousness. CT/MRI showed multifocal  acute ischemia within the largest area involving the right pericallosal white matter and left frontal operculum. No hemorrhage or mass effect. Old right frontal infarct and multiple small old cerebellar infarcts. Echocardiogram with ejection fraction of 65% grade 1 diastolic dysfunction . Patient did not receive TPA. Patient currently remains on Eliquis. Tolerating a regular diet. Physical therapy evaluation completed with recommendations of physical medicine rehabilitation consult.    Total: 18=NIH  Past Medical History  Past Medical History:  Diagnosis Date  . Hypertension   . PAF (paroxysmal atrial fibrillation) (HCC)   . Stroke [facility])     Family History  family history includes Diabetes in her mother; Heart disease in her father, mother, sister, and  sister.  Prior Rehab/Hospitalizations: Was on CIR in March 2018 after CVA.  Has the patient had major surgery during 100 days prior to admission? No  Current Medications   Current Facility-Administered Medications:  .  acetaminophen (TYLENOL) tablet 650 mg, 650 mg, Oral, Q6H PRN **OR** acetaminophen (TYLENOL) suppository 650 mg, 650 mg, Rectal, Q6H PRN, Russella DarEllis, Allison L, NP .  apixaban (ELIQUIS) tablet 5 mg, 5 mg, Oral, BID, Junious SilkEllis, Allison L, NP, 5 mg at 06/06/17 1040 .  atorvastatin (LIPITOR) tablet 20 mg, 20 mg, Oral, q1800, Ancil BoozerStone, Taylor P, RPH, 20 mg at 06/05/17 1757 .  carvedilol (COREG) tablet 25 mg, 25 mg, Oral, BID WC, Junious SilkEllis, Allison L, NP, 25 mg at 06/06/17 1040 .  FLUoxetine (PROZAC) capsule 40 mg, 40 mg, Oral, Daily, Junious SilkEllis, Allison L, NP, 40 mg at 06/06/17 1040 .  hydrALAZINE (APRESOLINE) injection 10 mg, 10 mg, Intravenous, Q4H PRN, Russella DarEllis, Allison L, NP, 10 mg at 06/05/17 [id number] .  losartan (COZAAR) tablet 100 mg, 100 mg, Oral, Daily, Junious SilkEllis, Allison L, NP, 100 mg at 06/06/17 1040 .  mirtazapine (REMERON SOL-TAB) disintegrating tablet 15 mg, 15 mg, Oral, QHS, Junious SilkEllis, Allison L, NP .  ondansetron (ZOFRAN) tablet 4 mg, 4 mg, Oral,  Q6H PRN **OR** ondansetron (ZOFRAN) injection 4 mg, 4 mg, Intravenous, Q6H PRN, Junious SilkEllis, Allison L, NP .  sodium chloride flush (NS) 0.9 % injection 3 mL, 3 mL, Intravenous, Q12H, Junious SilkEllis, Allison L, NP, 3 mL at 06/06/17 1040 .  verapamil (CALAN-SR) CR tablet 180 mg, 180 mg, Oral, BID, Russella DarEllis, Allison L, NP, 180 mg at 06/06/17 1041  Patients Current Diet: Diet regular Room service appropriate? Yes; Fluid consistency: Thin  Precautions / Restrictions Precautions Precautions: Fall Restrictions Weight Bearing Restrictions: No   Has the patient had 2 or more falls or a fall with injury in the past year?Yes.  Reports 2 falls with injury.  Prior Activity Level Community (5-7x/wk): Micah FlesherWent out with daughter most days of the week.  Home Assistive Devices / Equipment Home Equipment: Walker - 2 wheels, Bedside commode  Prior Device Use: Indicate devices/aids used by the patient prior to current illness, exacerbation or injury? Walker  Prior Functional Level Prior Function Level of Independence: Independent with assistive device(s) Gait / Transfers Assistance Needed: pt's daughter reported that she was ambulating with use of RW ADL's / Homemaking Assistance Needed: independent (per daughter)  Self Care: Did the patient need help bathing, dressing, using the toilet or eating?  Independent  Indoor Mobility: Did the patient need assistance with walking from room to room (with or without device)? Independent  Stairs: Did the patient need assistance with internal or external stairs (with or without device)? Independent  Functional Cognition: Did the patient need help planning regular tasks such as shopping or remembering to take medications? Independent  Current Functional Level Cognition  Arousal/Alertness: Awake/alert Overall Cognitive Status: Impaired/Different from baseline Difficult to assess due to: Impaired communication Orientation Level:  (UTA) General Comments: pt did no respond verbally  or non-verbally to any questions or commands Attention: Sustained Sustained Attention: Impaired Sustained Attention Impairment: Verbal basic, Functional basic Problem Solving: Impaired Problem Solving Impairment: Functional basic Executive Function: Initiating Initiating: Impaired Initiating Impairment: Verbal basic    Extremity Assessment (includes Sensation/Coordination)  Upper Extremity Assessment: Defer to OT evaluation  Lower Extremity Assessment: Generalized weakness    ADLs       Mobility  Overal bed mobility: Needs Assistance Bed Mobility: Supine to Sit Supine to sit: Mod assist General bed  mobility comments: increased time and effort, mod A to achieve sitting EOB    Transfers  Overall transfer level: Needs assistance Equipment used: 2 person hand held assist Transfers: Sit to/from Stand, Stand Pivot Transfers Sit to Stand: Max assist Stand pivot transfers: Max assist General transfer comment: increased time and effort, verbal and tactile cueing for sequencing, max A to rise into standing from sitting EOB and max A with pivotal movements from bed<>BSC<>recliner    Ambulation / Gait / Stairs / Wheelchair Mobility       Posture / Balance Dynamic Sitting Balance Sitting balance - Comments: pt able to sit EOB with supervision Balance Overall balance assessment: Needs assistance Sitting-balance support: Feet supported Sitting balance-Leahy Scale: Fair Sitting balance - Comments: pt able to sit EOB with supervision Standing balance support: During functional activity Standing balance-Leahy Scale: Zero Standing balance comment: max A    Special needs/care consideration BiPAP/CPAP No CPM No Continuous Drip IV No Dialysis No       Life Vest No Oxygen No Special Bed No Trach Size No Wound Vac (area) No    Skin Bruising on skin                           Bowel mgmt: Last BM 06/06/17 per daughter, constipation Bladder mgmt: Incontinence Diabetic mgmt No     Previous Home Environment Living Arrangements: Children  Lives With: Daughter Available Help at Discharge: Family, Available 24 hours/day Type of Home: House Home Layout: One level Home Access: Level entry Bathroom Shower/Tub: Tub/shower unit  Discharge Living Setting Plans for Discharge Living Setting: House, Lives with (comment) (Lives with daughter.) Type of Home at Discharge: House Discharge Home Layout: One level Discharge Home Access: Level entry  Social/Family/Support Systems Patient Roles: Parent (Has 2 sons and 1 daughter.) Contact Information: [place] Mam - daughter Anticipated Caregiver: daughter and son Anticipated Caregiver's Contact Information: Aram Beecham - daughter - [phone] Ability/Limitations of Caregiver: Daughter works and is worried about losing her job.  She says she needs help.  Plans to talk to her brother, her daughter and family.  Daughter would like to have an aide to assist her mom when patient is discharged home. Caregiver Availability: Other (Comment) (Dtr aware of potential need for 24/7 supervision/assist) Discharge Plan Discussed with Primary Caregiver: Yes Is Caregiver In Agreement with Plan?: Yes Does Caregiver/Family have Issues with Lodging/Transportation while Pt is in Rehab?: No  Goals/Additional Needs Patient/Family Goal for Rehab: PT/OT supervision to min assist, SLP supervision to min to mod assist goals Expected length of stay: 15-20 days Cultural Considerations[place]  Catholic.  Speaks [place] and [place]. Dietary Needs: Regular diet, thin liquids Equipment Needs: TBD Pt/Family Agrees to Admission and willing to participate: Yes Program Orientation Provided & Reviewed with Pt/Caregiver Including Roles  & Responsibilities: Yes  Decrease burden of Care through IP rehab admission: N/A  Possible need for SNF placement upon discharge: Yes, if daughter does not feel she can manage or get enough support at the time of  D/C  Patient Condition: This patient's condition remains as documented in the consult dated 06/06/17, in which the Rehabilitation Physician determined and documented that the patient's condition is appropriate for intensive rehabilitative care in an inpatient rehabilitation facility. Will admit to inpatient rehab today.  Preadmission Screen Completed By:  Trish Mage, 06/06/2017 2:28 PM ______________________________________________________________________   Discussed status with Dr. Riley Kill on 06/06/17 at 1427 and received telephone approval for admission today.  Admission Coordinator:  Trish Mage, time 1427/Date 06/06/17

## 2017-06-06 NOTE — Progress Notes (Signed)
Pt d/c to CIR, IV access left in due to PRN Hydralazine for high BP. Report given to admitting RN.

## 2017-06-06 NOTE — Progress Notes (Signed)
CSW consulted for possible discharge to SNF as back-up to CIR admission. Per Rehab Admission Coordinator, patient will admit to CIR today. CSW no longer needed to assist with discharge planning.   CSW signing off.  Blenda Nicelylizabeth Chenika Nevils LCSW [phone]

## 2017-06-06 NOTE — Progress Notes (Signed)
Ranelle Oyster, MD Physician Signed Physical Medicine and Rehabilitation  Consult Note Date of Service: 06/06/2017 6:22 AM  Related encounter: ED to Hosp-Admission (Current) from 06/04/2017 in [facility] 56[facility] NEURO MEDICAL     Expand All Collapse All   [] Hide copied text [] Hover for attribution information      Physical Medicine and Rehabilitation Consult Reason for Consult: Decreased functional mobility Referring Physician: [facility]   HPI: Christina [name] is a 71 y.o. 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 Present 06/04/2017 with mental status/non- as well as recent fall without loss of consciousness. CT/MRI showed multifocal acute ischemia within the largest area involving the right pericallosal white matter and left frontal operculum. No hemorrhage or mass effect. Old right frontal infarct and multiple small old cerebellar infarcts. Echocardiogram with ejection fraction of 65% grade 1 diastolic dysfunction . Patient did not receive TPA. Patient currently remains on Eliquis. Tolerating a regular diet. Physical therapy evaluation completed with recommendations of physical medicine rehabilitation consult.   Review of Systems  Unable to perform ROS: Acuity of condition       Past Medical History:  Diagnosis Date  . Hypertension   . PAF (paroxysmal atrial fibrillation) (HCC)   . Stroke [facility])         Past Surgical History:  Procedure Laterality Date  . ABDOMINAL HYSTERECTOMY    . EYE SURGERY     Family History  Problem Relation Age of Onset  . Heart disease Mother   . Diabetes Mother   . Heart disease Father   . Heart disease Sister   . Heart  disease Sister    Social History:  reports that she has quit smoking. She has never used smokeless tobacco. She reports that she does not drink alcohol or use drugs. Allergies: No Known Allergies       Medications Prior to Admission  Medication Sig Dispense Refill  . acetaminophen (TYLENOL) 500 MG tablet Take 2 tablets (1,000 mg total) by mouth every 6 (six) hours as needed. 30 tablet 0  . apixaban (ELIQUIS) 5 MG TABS tablet Take 1 tablet (5 mg total) by mouth 2 (two) times daily. 60 tablet 2  . carvedilol (COREG) 25 MG tablet Take 1 tablet (25 mg total) by mouth 2 (two) times daily with a meal. 60 tablet 2  . cephALEXin (KEFLEX) 500 MG capsule Take 2 capsules (1,000 mg total) by mouth 2 (two) times daily. 28 capsule 0  . FLUoxetine (PROZAC) 40 MG capsule Take 1 capsule (40 mg total) by mouth daily. 30 capsule 2  . hydrochlorothiazide (HYDRODIURIL) 25 MG tablet Take 1 tablet (25 mg total) by mouth daily. 30 tablet 1  . latanoprost (XALATAN) 0.005 % ophthalmic solution Place 1 drop into both eyes every morning. 2.5 mL 12  . lidocaine (LIDODERM) 5 % Place 1 patch onto the skin daily. Remove & Discard patch within 12 hours or as directed by MD 30 patch 0  . losartan (COZAAR) 100 MG tablet Take 1 tablet (100 mg total) by mouth daily. 30 tablet 1  . mirtazapine (REMERON SOL-TAB) 15 MG disintegrating tablet Take 1 tablet (15 mg total) by mouth at bedtime. 30 tablet 2  . nitrofurantoin, macrocrystal-monohydrate, (MACROBID) 100 MG capsule Take 1 capsule (100 mg total) by  mouth 2 (two) times daily. 10 capsule 0  . potassium chloride SA (K-DUR,KLOR-CON) 20 MEQ tablet Take 1 tablet (20 mEq total) by mouth daily. 30 tablet 1  . senna-docusate (SENOKOT-S) 8.6-50 MG tablet Take 1 tablet by mouth at bedtime as needed for moderate constipation. (Patient taking differently: Take 1 tablet by mouth at bedtime as needed for moderate constipation. ) 30 tablet 1  . simvastatin (ZOCOR) 40 MG tablet Take 1 tablet (40  mg total) by mouth daily. 90 tablet 0  . traMADol (ULTRAM) 50 MG tablet Take 2 tablets (100 mg total) by mouth every 12 (twelve) hours as needed for moderate pain. 20 tablet 0  . verapamil (CALAN-SR) 180 MG CR tablet Take 1 tablet (180 mg total) by mouth 2 (two) times daily. 60 tablet 2  . VOLTAREN 1 % GEL Apply 2 g topically 4 (four) times daily. 100 g 1    Home: Home Living Family/patient expects to be discharged to:: Private residence Living Arrangements: Children Available Help at Discharge: Family, Available 24 hours/day Type of Home: House Home Access: Level entry Home Layout: One level Bathroom Shower/Tub: Tub/shower unit Home Equipment: Environmental consultantWalker - 2 wheels, Bedside commode  Functional History: Prior Function Level of Independence: Independent with assistive device(s) Gait / Transfers Assistance Needed: pt's daughter reported that she was ambulating with use of RW ADL's / Homemaking Assistance Needed: independent (per daughter) Functional Status:  Mobility: Bed Mobility Overal bed mobility: Needs Assistance Bed Mobility: Supine to Sit Supine to sit: Mod assist General bed mobility comments: increased time and effort, mod A to achieve sitting EOB Transfers Overall transfer level: Needs assistance Equipment used: 2 person hand held assist Transfers: Sit to/from Stand, Stand Pivot Transfers Sit to Stand: Max assist Stand pivot transfers: Max assist General transfer comment: increased time and effort, verbal and tactile cueing for sequencing, max A to rise into standing from sitting EOB and max A with pivotal movements from bed<>BSC<>recliner  ADL:  Cognition: Cognition Overall Cognitive Status: Difficult to assess Orientation Level: Other (comment) Cognition Arousal/Alertness: Awake/alert Behavior During Therapy: Flat affect Overall Cognitive Status: Difficult to assess General Comments: pt did no respond verbally or non-verbally to any questions or  commands Difficult to assess due to: Impaired communication  Blood pressure (!) 174/84, pulse 79, temperature 98.5 F (36.9 C), temperature source Oral, resp. rate 20, weight 60.7 kg (133 lb 14.4 oz), SpO2 100 %. Physical Exam  HENT:  Healing abrasions to the nose and face  Eyes:  Pupils round and reactive to light  Neck: Normal range of motion. Neck supple. No thyromegaly present.  Cardiovascular:  Cardiac rate controlled  Respiratory: Effort normal and breath sounds normal. No respiratory distress.  GI: Soft. Bowel sounds are normal. She exhibits no distension.  Neurological: She is alert.  Patient is nonverbal. Expressive>receptive language deficits. She will make eye contact. Easily distracted. She grasped with both hands when cued but perseverated on task and could not move on to another command. She did use some yes and no head nods but inconsistent. Moves all 4 limbs. Seems to sense pain  Skin: Skin is warm and dry.  Psychiatric:  flat    Lab Results Last 24 Hours       Results for orders placed or performed during the hospital encounter of 06/04/17 (from the past 24 hour(s))  Magnesium     Status: Abnormal   Collection Time: 06/05/17  7:28 AM  Result Value Ref Range   Magnesium 1.5 (L) 1.7 -  2.4 mg/dL      Imaging Results (Last 48 hours)  Ct Head Wo Contrast  Result Date: 06/04/2017 CLINICAL DATA:  Status post fall. No loss of consciousness. Confused. EXAM: CT HEAD WITHOUT CONTRAST CT CERVICAL SPINE WITHOUT CONTRAST TECHNIQUE: Multidetector CT imaging of the head and cervical spine was performed following the standard protocol without intravenous contrast. Multiplanar CT image reconstructions of the cervical spine were also generated. COMPARISON:  05/01/2017 FINDINGS: CT HEAD FINDINGS Brain: No evidence of acute infarction, hemorrhage, extra-axial collection, ventriculomegaly, or mass effect. Old right frontal lobe infarct with encephalomalacia. Small area of  encephalomalacia in the left parietal lobe. Generalized cerebral atrophy. Periventricular white matter low attenuation likely secondary to microangiopathy. Vascular: Cerebrovascular atherosclerotic calcifications are noted. Skull: Negative for fracture or focal lesion. Sinuses/Orbits: Visualized portions of the orbits are unremarkable. Visualized portions of the paranasal sinuses and mastoid air cells are unremarkable. Other: None. CT CERVICAL SPINE FINDINGS Alignment: Normal. Skull base and vertebrae: No acute fracture. No primary bone lesion or focal pathologic process. Soft tissues and spinal canal: No prevertebral fluid or swelling. No visible canal hematoma. Disc levels: Degenerative disc disease with disc height loss C4-5, C5-6 and C6-7. Mild bilateral uncovertebral degenerative changes at C4-5, C5-6 and C6-7. Mild left foraminal stenosis at C4-5. Mild bilateral foraminal stenosis at C5-6. Mild right foraminal stenosis at C6-7. Upper chest: Lung apices are clear. Other: Bilateral carotid artery atherosclerosis. IMPRESSION: 1. No acute intracranial pathology. 2. No acute osseous injury of the cervical spine. 3. Cervical spine spondylosis as described above. Electronically Signed   By: Elige Ko   On: 06/04/2017 12:19   Ct Cervical Spine Wo Contrast  Result Date: 06/04/2017 CLINICAL DATA:  Status post fall. No loss of consciousness. Confused. EXAM: CT HEAD WITHOUT CONTRAST CT CERVICAL SPINE WITHOUT CONTRAST TECHNIQUE: Multidetector CT imaging of the head and cervical spine was performed following the standard protocol without intravenous contrast. Multiplanar CT image reconstructions of the cervical spine were also generated. COMPARISON:  05/01/2017 FINDINGS: CT HEAD FINDINGS Brain: No evidence of acute infarction, hemorrhage, extra-axial collection, ventriculomegaly, or mass effect. Old right frontal lobe infarct with encephalomalacia. Small area of encephalomalacia in the left parietal lobe.  Generalized cerebral atrophy. Periventricular white matter low attenuation likely secondary to microangiopathy. Vascular: Cerebrovascular atherosclerotic calcifications are noted. Skull: Negative for fracture or focal lesion. Sinuses/Orbits: Visualized portions of the orbits are unremarkable. Visualized portions of the paranasal sinuses and mastoid air cells are unremarkable. Other: None. CT CERVICAL SPINE FINDINGS Alignment: Normal. Skull base and vertebrae: No acute fracture. No primary bone lesion or focal pathologic process. Soft tissues and spinal canal: No prevertebral fluid or swelling. No visible canal hematoma. Disc levels: Degenerative disc disease with disc height loss C4-5, C5-6 and C6-7. Mild bilateral uncovertebral degenerative changes at C4-5, C5-6 and C6-7. Mild left foraminal stenosis at C4-5. Mild bilateral foraminal stenosis at C5-6. Mild right foraminal stenosis at C6-7. Upper chest: Lung apices are clear. Other: Bilateral carotid artery atherosclerosis. IMPRESSION: 1. No acute intracranial pathology. 2. No acute osseous injury of the cervical spine. 3. Cervical spine spondylosis as described above. Electronically Signed   By: Elige Ko   On: 06/04/2017 12:19   Mr Brain Wo Contrast  Result Date: 06/04/2017 CLINICAL DATA:  Aphasia.  Atrial fibrillation. EXAM: MRI HEAD WITHOUT CONTRAST TECHNIQUE: Multiplanar, multiecho pulse sequences of the brain and surrounding structures were obtained without intravenous contrast. COMPARISON:  Head CT 06/04/2017 FINDINGS: Brain: The midline structures are normal. There is multifocal diffusion  restriction including punctate focus in the right cerebellum, larger area in the left frontal operculum and insula and a similar size area of the right cingulum/pericallosal white matter. There are multiple old cerebellar infarcts and bilateral basal ganglia lacunar infarcts. There is right frontal pole encephalomalacia predominantly involving the medial frontal  gyrus. There is beginning confluent hyperintense T2-weighted signal within the periventricular white matter, most often seen in the setting of chronic microvascular ischemia. No intraparenchymal hematoma or chronic microhemorrhage. Atrophy is greater than expected for age without clear lobar predominance. The dura is normal and there is no extra-axial collection. Vascular: Major intracranial arterial and venous sinus flow voids are preserved. Skull and upper cervical spine: The Sinuses/Orbits: Left maxillary sinus mucosal thickening. Bilateral lens replacements. IMPRESSION: 1. Multifocal acute ischemia with the largest areas involving the right pericallosal white matter and left frontal operculum/and slow with a punctate focus of ischemia in the right cerebellum. 2. No hemorrhage or mass effect. 3. Old right frontal infarct and multiple small old cerebellar infarcts superimposed on chronic microvascular ischemia. Electronically Signed   By: Deatra Robinson M.D.   On: 06/04/2017 17:26     Assessment/Plan: Diagnosis: left pericallosal/frontal operculum infarct with increase weakness and expressive>receptive language defcits 1. Does the need for close, 24 hr/day medical supervision in concert with the patient's rehab needs make it unreasonable for this patient to be served in a less intensive setting? Yes 2. Co-Morbidities requiring supervision/potential complications: HTN, depression, hypokalemia 3. Due to bladder management, bowel management, safety, skin/wound care, disease management, medication administration, pain management and patient education, does the patient require 24 hr/day rehab nursing? Yes 4. Does the patient require coordinated care of a physician, rehab nurse, PT (1-2 hrs/day, 5 days/week), OT (1-2 hrs/day, 5 days/week) and SLP (1-2 hrs/day, 5 days/week) to address physical and functional deficits in the context of the above medical diagnosis(es)? Yes Addressing deficits in the following  areas: balance, endurance, locomotion, strength, transferring, bowel/bladder control, bathing, dressing, feeding, grooming, toileting, cognition, language and psychosocial support 5. Can the patient actively participate in an intensive therapy program of at least 3 hrs of therapy per day at least 5 days per week? Yes 6. The potential for patient to make measurable gains while on inpatient rehab is good 7. Anticipated functional outcomes upon discharge from inpatient rehab are supervision to min assist  with PT, supervision to min assist with OT, supervision and min assist/mod assist with SLP. 8. Estimated rehab length of stay to reach the above functional goals is: 15-20 days 9. Anticipated D/C setting: Home 10. Anticipated post D/C treatments: HH therapy 11. Overall Rehab/Functional Prognosis: excellent  RECOMMENDATIONS: This patient's condition is appropriate for continued rehabilitative care in the following setting: CIR Patient has agreed to participate in recommended program. Yes Note that insurance prior authorization may be required for reimbursement for recommended care.  Comment: Rehab Admissions Coordinator to follow up.  Thanks,  Ranelle Oyster, MD, [place] [facility]    Charlton Amor., PA-C 06/06/2017    Revision History                        Routing History

## 2017-06-06 NOTE — H&P (Signed)
Physical Medicine and Rehabilitation Admission H&P       Chief Complaint  Patient presents with  . 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 Present 06/04/2017 withmental status/non-as well as recent fall without loss of consciousness. CT/MRI showed multifocal acute ischemia within the largest area involving the right pericallosal white matter and left frontal operculum. No hemorrhage or mass effect. Old right frontal infarct and multiple small old cerebellar infarcts. Echocardiogramwith ejection fraction of 26% grade 1 diastolic dysfunction. Patient did not receive TPA. Patient currently remains on Eliquis. Tolerating a regular diet. Physical therapy evaluation completed with recommendations of physical medicine rehabilitation consult.Patient was admitted for a comprehensive rehabilitation program  Review of Systems  Unable to perform ROS: Language       Past Medical History:  Diagnosis Date  . Hypertension   . PAF (paroxysmal atrial fibrillation) ([place])   . Stroke [facility])         Past Surgical History:  Procedure Laterality Date  . ABDOMINAL HYSTERECTOMY    . EYE SURGERY          Family History  Problem Relation Age of Onset  . Heart disease Mother   . Diabetes Mother   . Heart disease Father   . Heart disease Sister   . Heart disease Sister    Social History:  reports that she has quit smoking. She has never used smokeless tobacco. She reports that she does not drink alcohol or use drugs. Allergies: No Known Allergies       Medications Prior to Admission  Medication Sig Dispense Refill  . acetaminophen (TYLENOL) 500  MG tablet Take 2 tablets (1,000 mg total) by mouth every 6 (six) hours as needed. 30 tablet 0  . apixaban (ELIQUIS) 5 MG TABS tablet Take 1 tablet (5 mg total) by mouth 2 (two) times daily. 60 tablet 2  . carvedilol (COREG) 25 MG tablet Take 1 tablet (25 mg total) by mouth 2 (two) times daily with a meal. 60 tablet 2  . cephALEXin (KEFLEX) 500 MG capsule Take 2 capsules (1,000 mg total) by mouth 2 (two) times daily. 28 capsule 0  . FLUoxetine (PROZAC) 40 MG capsule Take 1 capsule (40 mg total) by mouth daily. 30 capsule 2  . hydrochlorothiazide (HYDRODIURIL) 25 MG tablet Take 1 tablet (25 mg total) by mouth daily. 30 tablet 1  . latanoprost (XALATAN) 0.005 % ophthalmic solution Place 1 drop into both eyes every morning. 2.5 mL 12  . lidocaine (LIDODERM) 5 % Place 1 patch onto the skin daily. Remove & Discard patch within 12 hours or as directed by MD 30 patch 0  . losartan (COZAAR) 100 MG tablet Take 1 tablet (100 mg total) by mouth daily. 30 tablet 1  . mirtazapine (REMERON SOL-TAB) 15 MG disintegrating tablet Take 1 tablet (15 mg total) by mouth at bedtime. 30 tablet 2  . nitrofurantoin, macrocrystal-monohydrate, (MACROBID) 100 MG capsule Take 1 capsule (100 mg total) by mouth 2 (two) times daily. 10 capsule 0  . potassium chloride SA (K-DUR,KLOR-CON) 20 MEQ tablet Take 1 tablet (20 mEq total) by mouth daily. 30 tablet 1  . senna-docusate (SENOKOT-S) 8.6-50 MG tablet Take 1 tablet by mouth at bedtime as needed for moderate  constipation. (Patient taking differently: Take 1 tablet by mouth at bedtime as needed for moderate constipation. ) 30 tablet 1  . simvastatin (ZOCOR) 40 MG tablet Take 1 tablet (40 mg total) by mouth daily. 90 tablet 0  . traMADol (ULTRAM) 50 MG tablet Take 2 tablets (100 mg total) by mouth every 12 (twelve) hours as needed for moderate pain. 20 tablet 0  . verapamil (CALAN-SR) 180 MG CR tablet Take 1 tablet (180 mg total) by mouth 2 (two) times daily. 60 tablet 2  . VOLTAREN 1  % GEL Apply 2 g topically 4 (four) times daily. 100 g 1    Home: Home Living Family/patient expects to be discharged to:: Private residence Living Arrangements: Children Available Help at Discharge: Family, Available 24 hours/day Type of Home: House Home Access: Level entry Home Layout: One level Bathroom Shower/Tub: Tub/shower unit Home Equipment: Environmental consultant - 2 wheels, Bedside commode   Functional History: Prior Function Level of Independence: Independent with assistive device(s) Gait / Transfers Assistance Needed: pt's daughter reported that she was ambulating with use of RW ADL's / Homemaking Assistance Needed: independent (per daughter)  Functional Status:  Mobility: Bed Mobility Overal bed mobility: Needs Assistance Bed Mobility: Supine to Sit Supine to sit: Mod assist General bed mobility comments: increased time and effort, mod A to achieve sitting EOB Transfers Overall transfer level: Needs assistance Equipment used: 2 person hand held assist Transfers: Sit to/from Stand, Stand Pivot Transfers Sit to Stand: Max assist Stand pivot transfers: Max assist General transfer comment: increased time and effort, verbal and tactile cueing for sequencing, max A to rise into standing from sitting EOB and max A with pivotal movements from bed<>BSC<>recliner  ADL:  Cognition: Cognition Overall Cognitive Status: Difficult to assess Orientation Level: Other (comment) (patient aphasic) Cognition Arousal/Alertness: Awake/alert Behavior During Therapy: Flat affect Overall Cognitive Status: Difficult to assess General Comments: pt did no respond verbally or non-verbally to any questions or commands Difficult to assess due to: Impaired communication  Physical Exam: Blood pressure (!) 168/74, pulse 71, temperature 98.6 F (37 C), temperature source Oral, resp. rate 18, weight 60.7 kg (133 lb 14.4 oz), SpO2 100 %. Physical Exam HENT:  Healing abrasions to the nose and  face Eyes:  Pupils round and reactive to light. No nystagmus Neck: Normal range of motion. Neck supple. No thyromegalypresent.  Cardiovascular:  Cardiac rate controlled. No murmur Respiratory: Effort normaland breath sounds normal without wheeze. No respiratory distress.  GI: Soft/nontender. Bowel sounds are normal. She exhibits no distension.  Neurological: She is alert.  Patient is nonverbal. Expressive greater than receptive language deficits. Makes eye contact. Followed some simple commands but perseverative. Had hard time shifting between tasks. Moves all 4's left more than right but inconsistent due to language/comprehension. Seems to sense pain in all 4's.  Psych: flat, distracted   Lab Results Last 48 Hours        Results for orders placed or performed during the hospital encounter of 06/04/17 (from the past 48 hour(s))  TSH     Status: None   Collection Time: 06/04/17  6:59 PM  Result Value Ref Range   TSH 1.623 0.350 - 4.500 uIU/mL    Comment: Performed by a 3rd Generation assay with a functional sensitivity of <=0.01 uIU/mL.  RPR     Status: None   Collection Time: 06/04/17  6:59 PM  Result Value Ref Range   RPR Ser Ql Non Reactive Non Reactive    Comment: (NOTE) Performed At: [facility] [place], [place] [id number] Lindon Romp MD ZM:[id number]   Vitamin B12     Status: None   Collection Time: 06/04/17  6:59 PM  Result Value Ref Range   Vitamin B-12 841 180 - 914 pg/mL    Comment: (NOTE) This assay is not validated for testing neonatal or myeloproliferative syndrome specimens for Vitamin B12 levels.   Folate     Status: None   Collection Time: 06/04/17  6:59 PM  Result Value Ref Range   Folate 14.2 >5.9 ng/mL  Iron and TIBC     Status: Abnormal   Collection Time: 06/04/17  6:59 PM  Result Value Ref Range   Iron 33 28 - 170 ug/dL   TIBC 328 250 - 450 ug/dL   Saturation Ratios 10 (L) 10.4 - 31.8 %   UIBC  295 ug/dL  Ferritin     Status: None   Collection Time: 06/04/17  6:59 PM  Result Value Ref Range   Ferritin 62 11 - 307 ng/mL  Reticulocytes     Status: None   Collection Time: 06/04/17  6:59 PM  Result Value Ref Range   Retic Ct Pct 0.9 0.4 - 3.1 %   RBC. 4.25 3.87 - 5.11 MIL/uL   Retic Count, Manual 38.3 19.0 - 186.0 K/uL  HIV antibody     Status: None   Collection Time: 06/04/17  6:59 PM  Result Value Ref Range   HIV Screen 4th Generation wRfx Non Reactive Non Reactive    Comment: (NOTE) Performed At: [facility] [place], [place] [id number] Lindon Romp MD [id number]   Magnesium     Status: Abnormal   Collection Time: 06/04/17  6:59 PM  Result Value Ref Range   Magnesium 1.6 (L) 1.7 - 2.4 mg/dL  Phosphorus     Status: None   Collection Time: 06/04/17  6:59 PM  Result Value Ref Range   Phosphorus 2.8 2.5 - 4.6 mg/dL  Ammonia     Status: None   Collection Time: 06/04/17  7:08 PM  Result Value Ref Range   Ammonia 22 9 - 35 umol/L  Urinalysis, Routine w reflex microscopic     Status: Abnormal   Collection Time: 06/05/17  2:02 AM  Result Value Ref Range   Color, Urine YELLOW YELLOW   APPearance CLEAR CLEAR   Specific Gravity, Urine 1.011 1.005 - 1.030   pH 7.0 5.0 - 8.0   Glucose, UA NEGATIVE NEGATIVE mg/dL   Hgb urine dipstick NEGATIVE NEGATIVE   Bilirubin Urine NEGATIVE NEGATIVE   Ketones, ur NEGATIVE NEGATIVE mg/dL   Protein, ur NEGATIVE NEGATIVE mg/dL   Nitrite NEGATIVE NEGATIVE   Leukocytes, UA TRACE (A) NEGATIVE   RBC / HPF 0-5 0 - 5 RBC/hpf   WBC, UA 6-30 0 - 5 WBC/hpf   Bacteria, UA NONE SEEN NONE SEEN   Squamous Epithelial / LPF NONE SEEN NONE SEEN   Hyaline Casts, UA PRESENT   Culture, Urine     Status: None   Collection Time: 06/05/17  2:02 AM  Result Value Ref Range   Specimen Description URINE, CATHETERIZED    Special Requests NONE    Culture NO GROWTH    Report Status  06/06/2017 FINAL   Urine rapid drug screen (hosp performed)     Status: None   Collection Time: 06/05/17  2:02 AM  Result Value Ref Range   Opiates NONE DETECTED NONE DETECTED   Cocaine NONE DETECTED NONE  DETECTED   Benzodiazepines NONE DETECTED NONE DETECTED   Amphetamines NONE DETECTED NONE DETECTED   Tetrahydrocannabinol NONE DETECTED NONE DETECTED   Barbiturates NONE DETECTED NONE DETECTED    Comment:        DRUG SCREEN FOR MEDICAL PURPOSES ONLY.  IF CONFIRMATION IS NEEDED FOR ANY PURPOSE, NOTIFY LAB WITHIN 5 DAYS.        LOWEST DETECTABLE LIMITS FOR URINE DRUG SCREEN Drug Class       Cutoff (ng/mL) Amphetamine      1000 Barbiturate      200 Benzodiazepine   226 Tricyclics       333 Opiates          300 Cocaine          300 THC              50   Comprehensive metabolic panel     Status: Abnormal   Collection Time: 06/05/17  4:07 AM  Result Value Ref Range   Sodium 135 135 - 145 mmol/L   Potassium 3.1 (L) 3.5 - 5.1 mmol/L    Comment: NO VISIBLE HEMOLYSIS   Chloride 102 101 - 111 mmol/L   CO2 23 22 - 32 mmol/L   Glucose, Bld 123 (H) 65 - 99 mg/dL   BUN 18 6 - 20 mg/dL   Creatinine, Ser 1.19 (H) 0.44 - 1.00 mg/dL   Calcium 10.0 8.9 - 10.3 mg/dL   Total Protein 7.1 6.5 - 8.1 g/dL   Albumin 3.7 3.5 - 5.0 g/dL   AST 18 15 - 41 U/L   ALT 13 (L) 14 - 54 U/L   Alkaline Phosphatase 64 38 - 126 U/L   Total Bilirubin 0.8 0.3 - 1.2 mg/dL   GFR calc non Af Amer 45 (L) >60 mL/min   GFR calc Af Amer 52 (L) >60 mL/min    Comment: (NOTE) The eGFR has been calculated using the CKD EPI equation. This calculation has not been validated in all clinical situations. eGFR's persistently <60 mL/min signify possible Chronic Kidney Disease.    Anion gap 10 5 - 15  CBC     Status: None   Collection Time: 06/05/17  4:07 AM  Result Value Ref Range   WBC 5.8 4.0 - 10.5 K/uL   RBC 4.53 3.87 - 5.11 MIL/uL   Hemoglobin 12.1 12.0 - 15.0 g/dL   HCT  37.1 36.0 - 46.0 %   MCV 81.9 78.0 - 100.0 fL   MCH 26.7 26.0 - 34.0 pg   MCHC 32.6 30.0 - 36.0 g/dL   RDW 12.9 11.5 - 15.5 %   Platelets 229 150 - 400 K/uL  Magnesium     Status: Abnormal   Collection Time: 06/05/17  7:28 AM  Result Value Ref Range   Magnesium 1.5 (L) 1.7 - 2.4 mg/dL  Basic metabolic panel     Status: Abnormal   Collection Time: 06/06/17  6:35 AM  Result Value Ref Range   Sodium 135 135 - 145 mmol/L   Potassium 3.5 3.5 - 5.1 mmol/L   Chloride 103 101 - 111 mmol/L   CO2 23 22 - 32 mmol/L   Glucose, Bld 121 (H) 65 - 99 mg/dL   BUN 26 (H) 6 - 20 mg/dL   Creatinine, Ser 1.95 (H) 0.44 - 1.00 mg/dL   Calcium 9.5 8.9 - 10.3 mg/dL   GFR calc non Af Amer 25 (L) >60 mL/min   GFR calc Af Amer 29 (L) >60 mL/min  Comment: (NOTE) The eGFR has been calculated using the CKD EPI equation. This calculation has not been validated in all clinical situations. eGFR's persistently <60 mL/min signify possible Chronic Kidney Disease.    Anion gap 9 5 - 15  Magnesium     Status: None   Collection Time: 06/06/17  6:35 AM  Result Value Ref Range   Magnesium 2.2 1.7 - 2.4 mg/dL      Imaging Results (Last 48 hours)  Mr Brain Wo Contrast  Result Date: 06/04/2017 CLINICAL DATA:  Aphasia.  Atrial fibrillation. EXAM: MRI HEAD WITHOUT CONTRAST TECHNIQUE: Multiplanar, multiecho pulse sequences of the brain and surrounding structures were obtained without intravenous contrast. COMPARISON:  Head CT 06/04/2017 FINDINGS: Brain: The midline structures are normal. There is multifocal diffusion restriction including punctate focus in the right cerebellum, larger area in the left frontal operculum and insula and a similar size area of the right cingulum/pericallosal white matter. There are multiple old cerebellar infarcts and bilateral basal ganglia lacunar infarcts. There is right frontal pole encephalomalacia predominantly involving the medial frontal gyrus. There is  beginning confluent hyperintense T2-weighted signal within the periventricular white matter, most often seen in the setting of chronic microvascular ischemia. No intraparenchymal hematoma or chronic microhemorrhage. Atrophy is greater than expected for age without clear lobar predominance. The dura is normal and there is no extra-axial collection. Vascular: Major intracranial arterial and venous sinus flow voids are preserved. Skull and upper cervical spine: The Sinuses/Orbits: Left maxillary sinus mucosal thickening. Bilateral lens replacements. IMPRESSION: 1. Multifocal acute ischemia with the largest areas involving the right pericallosal white matter and left frontal operculum/and slow with a punctate focus of ischemia in the right cerebellum. 2. No hemorrhage or mass effect. 3. Old right frontal infarct and multiple small old cerebellar infarcts superimposed on chronic microvascular ischemia. Electronically Signed   By: Ulyses Jarred M.D.   On: 06/04/2017 17:26        Medical Problem List and Plan: 1. Right-sided weakness with aphasia  secondary to left pericallosal frontal operculum infarct as well as recent left MCA infarct March 2018             -admit to inpatient rehab 2.  DVT Prophylaxis/Anticoagulation: Eliquis 3. Pain Management: Tylenol 4. Mood: Prozac 40 mg daily, Remeron 15 mg daily at bedtime             -team to provide egosupport as cognitively/linguistically appropriate             -family supportive as well 5. Neuropsych: This patient is  capable of making decisions on her own behalf. 6. Skin/Wound Care: Routine skin checks 7. Fluids/Electrolytes/Nutrition: Routine I&O's with follow-up chemistries upon admit 8.PAF. Cardiac rate control. Continue Eliquis 9. Hypertension.Coreg 25 mg BID,Cozaar 100 mg daily, verapamil 180 mg twice a day 10. Hyperlipidemia. Lipitor    Post Admission Physician Evaluation: 1. Functional deficits secondary  to left CVA. 2. Patient is  admitted to receive collaborative, interdisciplinary care between the physiatrist, rehab nursing staff, and therapy team. 3. Patient's level of medical complexity and substantial therapy needs in context of that medical necessity cannot be provided at a lesser intensity of care such as a SNF. 4. Patient has experienced substantial functional loss from his/her baseline which was documented above under the "Functional History" and "Functional Status" headings.  Judging by the patient's diagnosis, physical exam, and functional history, the patient has potential for functional progress which will result in measurable gains while on inpatient rehab.  These gains  will be of substantial and practical use upon discharge  in facilitating mobility and self-care at the household level. 5. Physiatrist will provide 24 hour management of medical needs as well as oversight of the therapy plan/treatment and provide guidance as appropriate regarding the interaction of the two. 6. The Preadmission Screening has been reviewed and patient status is unchanged unless otherwise stated above. 7. 24 hour rehab nursing will assist with bladder management, bowel management, safety, skin/wound care, disease management, medication administration, pain management and patient education  and help integrate therapy concepts, techniques,education, etc. 8. PT will assess and treat for/with: Lower extremity strength, range of motion, stamina, balance, functional mobility, safety, adaptive techniques and equipment, NMR, family education, cognitive perceptual rx.   Goals are: min assist to supervision. 9. OT will assess and treat for/with: ADL's, functional mobility, safety, upper extremity strength, adaptive techniques and equipment, NMR, family ed, cognitive-perceptual rx,.   Goals are: min assist to supervision. Therapy may proceed with showering this patient. 10. SLP will assess and treat for/with: cognition, communication/language, family  ed.  Goals are: min to mod assist. 11. Case Management and Social Worker will assess and treat for psychological issues and discharge planning. 12. Team conference will be held weekly to assess progress toward goals and to determine barriers to discharge. 13. Patient will receive at least 3 hours of therapy per day at least 5 days per week. 14. ELOS: 15-20 days       15. Prognosis:  excellent     Meredith Staggers, MD, [facility] 06/06/2017  Cathlyn Parsons., PA-C 06/06/2017

## 2017-06-06 NOTE — Progress Notes (Signed)
Christina [name], Christina [name] M, RN Rehab Admission Coordinator Signed Physical Medicine and Rehabilitation  PMR Pre-admission Date of Service: 06/06/2017 2:14 PM  Related encounter: ED to Hosp-Admission (Current) [facility]       [] Hide copied text PMR Admission Coordinator Pre-Admission Assessment  Patient: Christina Negusrinda Watt is an 71 y.o., female MRN: [id number] DOB: 07-03-1946 Height:   Weight: 60.7 kg (133 lb 14.4 oz)                                                                                                                                                  Insurance Information HMO: No    PPO:       PCP:       IPA:       80/20:       OTHER:   PRIMARY:  Medicare part B only      Policy#: [id number]      Subscriber: Sherlon Vane CM Name:        Phone#:       Fax#:   Pre-Cert#:        Employer: Not employed Benefits:  Phone #:       Name: Checked in Passport One [employer] Depot[employer]ff. Date: 03/23/17     Deduct:        Out of Pocket Max:        Life Max:   CIR:        SNF:   Outpatient:       Co-Pay:   Home Health:        Co-Pay:   DME:       Co-Pay:   Providers:    SECONDARY: Medicaid [place]       Policy#: [id number]      Subscriber: Christina NegusArinda Scotto CM Name:        Phone#:       Fax#:   Pre-Cert#:        Employer: Not employed Benefits:  Phone #: [phone]     Name:  Automated Eff. 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 Present 06/04/2017 withmental status/non-as well as recent fall without loss of consciousness. CT/MRI showed multifocal acute ischemia within the largest area involving the right pericallosal white matter and left frontal operculum. No hemorrhage or mass effect. Old right frontal infarct and multiple small old cerebellar infarcts. Echocardiogramwith ejection fraction of 65% grade 1 diastolic dysfunction. Patient did not receive TPA. Patient currently remains on Eliquis. Tolerating a regular diet. Physical therapy evaluation completed with recommendations of physical medicine rehabilitation consult.    Total: 18=NIH  Past Medical History      Past Medical History:  Diagnosis Date  . Hypertension   . PAF (paroxysmal atrial fibrillation) (HCC)   . Stroke [facility])     Family History  family history includes Diabetes in her mother; Heart disease in her father, mother, sister, and sister.  Prior Rehab/Hospitalizations: Was on CIR in March 2018 after CVA.  Has the patient had major surgery during 100 days prior to admission? No  Current Medications   Current Facility-Administered Medications:  .  acetaminophen (TYLENOL) tablet 650 mg, 650 mg, Oral, Q6H PRN **OR** acetaminophen (TYLENOL) suppository 650 mg, 650 mg, Rectal, Q6H PRN, Russella Dar, NP .  apixaban (ELIQUIS) tablet 5 mg, 5 mg, Oral, BID, Junious Silk L, NP, 5 mg at 06/06/17 1040 .  atorvastatin (LIPITOR) tablet 20 mg, 20 mg, Oral, q1800, Ancil Boozer, RPH, 20 mg at 06/05/17 1757 .  carvedilol (COREG) tablet 25 mg, 25 mg, Oral, BID WC, Junious Silk L, NP, 25 mg at 06/06/17 1040 .  FLUoxetine (PROZAC)  capsule 40 mg, 40 mg, Oral, Daily, Junious Silk L, NP, 40 mg at 06/06/17 1040 .  hydrALAZINE (APRESOLINE) injection 10 mg, 10 mg, Intravenous, Q4H PRN, Russella Dar, NP, 10 mg at 06/05/17 1610 .  losartan (COZAAR) tablet 100 mg, 100 mg, Oral, Daily, Junious Silk L, NP, 100 mg at 06/06/17 1040 .  mirtazapine (REMERON SOL-TAB) disintegrating tablet 15 mg, 15 mg, Oral, QHS, Junious Silk L, NP .  ondansetron (ZOFRAN) tablet 4 mg, 4 mg, Oral, Q6H PRN **OR** ondansetron (ZOFRAN) injection 4 mg, 4 mg, Intravenous, Q6H PRN, Junious Silk L, NP .  sodium chloride flush (NS) 0.9 % injection 3 mL, 3 mL, Intravenous, Q12H, Junious Silk L, NP, 3 mL at 06/06/17 1040 .  verapamil (CALAN-SR) CR tablet 180 mg, 180 mg, Oral, BID, Russella Dar, NP, 180 mg at 06/06/17 1041  Patients Current Diet: Diet regular Room service appropriate? Yes; Fluid consistency: Thin  Precautions / Restrictions Precautions Precautions: Fall Restrictions Weight Bearing Restrictions: No   Has the patient had 2 or more falls or a fall with injury in the past year?Yes.  Reports 2 falls with injury.  Prior Activity Level Community (5-7x/wk): Micah Flesher out with daughter most days of the week.  Home Assistive Devices / Equipment Home Equipment: Walker - 2 wheels, Bedside commode  Prior Device Use: Indicate devices/aids used by the patient prior to current illness, exacerbation or injury? Walker  Prior Functional Level Prior Function Level of Independence: Independent with assistive device(s) Gait / Transfers Assistance Needed: pt's daughter reported that she was ambulating with use of RW ADL's / Homemaking Assistance Needed: independent (per daughter)  Self Care: Did the patient need help bathing,  dressing, using the toilet or eating?  Independent  Indoor Mobility: Did the patient need assistance with walking from room to room (with or without device)? Independent  Stairs: Did the patient need assistance  with internal or external stairs (with or without device)? Independent  Functional Cognition: Did the patient need help planning regular tasks such as shopping or remembering to take medications? Independent  Current Functional Level Cognition  Arousal/Alertness: Awake/alert Overall Cognitive Status: Impaired/Different from baseline Difficult to assess due to: Impaired communication Orientation Level:  (UTA) General Comments: pt did no respond verbally or non-verbally to any questions or commands Attention: Sustained Sustained Attention: Impaired Sustained Attention Impairment: Verbal basic, Functional basic Problem Solving: Impaired Problem Solving Impairment: Functional basic Executive Function: Initiating Initiating: Impaired Initiating Impairment: Verbal basic    Extremity Assessment (includes Sensation/Coordination)  Upper Extremity Assessment: Defer to OT evaluation  Lower Extremity Assessment: Generalized weakness    ADLs       Mobility  Overal bed mobility: Needs Assistance Bed Mobility: Supine to Sit Supine to sit: Mod assist General bed mobility comments: increased time and effort, mod A to achieve sitting EOB    Transfers  Overall transfer level: Needs assistance Equipment used: 2 person hand held assist Transfers: Sit to/from Stand, Stand Pivot Transfers Sit to Stand: Max assist Stand pivot transfers: Max assist General transfer comment: increased time and effort, verbal and tactile cueing for sequencing, max A to rise into standing from sitting EOB and max A with pivotal movements from bed<>BSC<>recliner    Ambulation / Gait / Stairs / Wheelchair Mobility       Posture / Balance Dynamic Sitting Balance Sitting balance - Comments: pt able to sit EOB with supervision Balance Overall balance assessment: Needs assistance Sitting-balance support: Feet supported Sitting balance-Leahy Scale: Fair Sitting balance - Comments: pt able to sit EOB with  supervision Standing balance support: During functional activity Standing balance-Leahy Scale: Zero Standing balance comment: max A    Special needs/care consideration BiPAP/CPAP No CPM No Continuous Drip IV No Dialysis No       Life Vest No Oxygen No Special Bed No Trach Size No Wound Vac (area) No    Skin Bruising on skin                           Bowel mgmt: Last BM 06/06/17 per daughter, constipation Bladder mgmt: Incontinence Diabetic mgmt No    Previous Home Environment Living Arrangements: Children  Lives With: Daughter Available Help at Discharge: Family, Available 24 hours/day Type of Home: House Home Layout: One level Home Access: Level entry Bathroom Shower/Tub: Tub/shower unit  Discharge Living Setting Plans for Discharge Living Setting: House, Lives with (comment) (Lives with daughter.) Type of Home at Discharge: House Discharge Home Layout: One level Discharge Home Access: Level entry  Social/Family/Support Systems Patient Roles: Parent (Has 2 sons and 1 daughter.) Contact Information: [place] Mam - daughter Anticipated Caregiver: daughter and son Anticipated Caregiver's Contact Information: Aram Beecham - daughter - [phone] Ability/Limitations of Caregiver: Daughter works and is worried about losing her job.  She says she needs help.  Plans to talk to her brother, her daughter and family.  Daughter would like to have an aide to assist her mom when patient is discharged home. Caregiver Availability: Other (Comment) (Dtr aware of potential need for 24/7 supervision/assist) Discharge Plan Discussed with Primary Caregiver: Yes Is Caregiver In Agreement with Plan?: Yes Does Caregiver/Family have Issues with Lodging/Transportation while Pt is  in Rehab?: No  Goals/Additional Needs Patient/Family Goal for Rehab: PT/OT supervision to min assist, SLP supervision to min to mod assist goals Expected length of stay: 15-20 days Cultural Considerations[place]  Catholic.  Speaks [place] and [place]. Dietary Needs: Regular diet, thin liquids Equipment Needs: TBD Pt/Family Agrees to Admission and willing to participate: Yes Program Orientation Provided & Reviewed with Pt/Caregiver Including Roles  & Responsibilities: Yes  Decrease burden of Care through IP rehab admission: N/A  Possible need for SNF placement upon discharge: Yes, if daughter does not feel she can manage or get enough support at the time of D/C  Patient Condition: This patient's condition remains as documented in the consult dated 06/06/17, in which the Rehabilitation Physician determined and documented that the patient's condition is appropriate for intensive rehabilitative care in an inpatient rehabilitation facility. Will admit to inpatient rehab today.  Preadmission Screen Completed By:  Christina Mage, 06/06/2017 2:28 PM ______________________________________________________________________   Discussed status with Dr. Riley Kill on 06/06/17 at 1427 and received telephone approval for admission today.  Admission Coordinator:  Christina Mage, time 1427/Date 06/06/17       Cosigned by: Ranelle Oyster, MD at 06/06/2017 2:41 PM

## 2017-06-06 NOTE — Evaluation (Addendum)
Occupational Therapy Evaluation Patient Details Name: Christina [name] MRN: [id number] DOB: 30-May-1946 Today's Date: 06/06/2017    History of Present Illness Pt is a 71 y/o female admitted from home secondary to a mechanical fall. MRI revealed multifocal acute ischemia. PMH including but not limited to HTN and prior CVA in March of 2018.   Clinical Impression   PTA Pt was independent in ADL and mobility with RW. Pt currently max to total assist for all ADL and stand pivot transfers. Pt with no verbal or gestures during session. Pt will benefit from skilled OT in the acute setting to maximize safety and independence in ADL and functional transfers and will require CIR level therapy to return to PLOF, provide caregiver education, and decrease burden of care.     Follow Up Recommendations  CIR    Equipment Recommendations  Other (comment) (defer to next venue)    Recommendations for Other Services Rehab consult     Precautions / Restrictions Precautions Precautions: Fall Restrictions Weight Bearing Restrictions: No      Mobility Bed Mobility Overal bed mobility: Needs Assistance Bed Mobility: Supine to Sit     Supine to sit: Mod assist     General bed mobility comments: increased time and effort, mod A to achieve sitting EOB  Transfers Overall transfer level: Needs assistance Equipment used: 1 person hand held assist Transfers: Sit to/from [employer] Transfers Sit to Stand: Max assist Stand pivot transfers: Max assist       General transfer comment: increased time and effort, verbal and tactile cueing for sequencing, max A to rise into standing from sitting EOB and max A with pivotal movements from bed<>BSC<>recliner    Balance Overall balance assessment: Needs assistance Sitting-balance support: Feet supported Sitting balance-Leahy Scale: Fair Sitting balance - Comments: pt able to sit on [facility] with supervision   Standing balance support: During functional  activity Standing balance-Leahy Scale: Zero Standing balance comment: max A; clings to therapist in standing                           ADL either performed or assessed with clinical judgement   ADL Overall ADL's : Needs assistance/impaired Eating/Feeding: Maximal assistance   Grooming: Maximal assistance;Sitting;Bed level   Upper Body Bathing: Total assistance   Lower Body Bathing: Total assistance   Upper Body Dressing : Maximal assistance   Lower Body Dressing: Total assistance   Toilet Transfer: Maximal assistance;Stand-pivot;BSC Toilet Transfer Details (indicate cue type and reason): unable to follow cues, gripping onto surfaces early, very stiff and posterior lean during transfer Toileting- Clothing Manipulation and Hygiene: Total assistance       Functional mobility during ADLs: Maximal assistance (HHA, Pt clinging to therapist, able to move with rocking) General ADL Comments: no verbal or facial communication this session     Vision Patient Visual Report: Other (comment) (Pt unable to comment) Vision Assessment?: Yes Alignment/Gaze Preference: Gaze left Tracking/Visual Pursuits: Other (comment) (will track therapist from left to right)     Perception     Praxis      Pertinent Vitals/Pain Pain Assessment: Faces Faces Pain Scale: No hurt     Hand Dominance Right   Extremity/Trunk Assessment         Cervical / Trunk Assessment Cervical / Trunk Assessment: Other exceptions Cervical / Trunk Exceptions: rounded shoulders forward head   Communication Communication Communication: Receptive difficulties;Expressive difficulties   Cognition Arousal/Alertness: Awake/alert Behavior During Therapy: Flat affect Overall Cognitive  Status: No family/caregiver present to determine baseline cognitive functioning                                 General Comments: pt did not respond verbally or non-verbally to any questions or commands,  required hand over hand for all movement; when given a toothbrush she did make motions like brushing teeth but when provided with washcloth she did not do anything with it, it sat on her hand   General Comments       Exercises     Shoulder Instructions      Home Living Family/patient expects to be discharged to:: Private residence Living Arrangements: Children Available Help at Discharge: Family;Available 24 hours/day Type of Home: House Home Access: Level entry     Home Layout: One level     Bathroom Shower/Tub: Chief Strategy OfficerTub/shower unit   Bathroom Toilet: Standard Bathroom Accessibility: Yes How Accessible: Accessible via walker Home Equipment: Walker - 2 wheels;Bedside commode      Lives With: Daughter    Prior Functioning/Environment Level of Independence: Independent with assistive device(s)  Gait / Transfers Assistance Needed: pt's daughter reported that she was ambulating with use of RW ADL's / Homemaking Assistance Needed: independent (per daughter)            OT Problem List: Decreased activity tolerance;Impaired balance (sitting and/or standing);Decreased safety awareness;Decreased knowledge of use of DME or AE      OT Treatment/Interventions: Self-care/ADL training;Energy conservation;DME and/or AE instruction;Therapeutic activities;Therapeutic exercise;Patient/family education;Cognitive remediation/compensation;Neuromuscular education    OT Goals(Current goals can be found in the care plan section) Acute Rehab OT Goals Patient Stated Goal: none stated, family not present OT Goal Formulation: Patient unable to participate in goal setting ADL Goals Pt Will Perform Grooming: with mod assist;with caregiver independent in assisting;sitting Additional ADL Goal #1: Pt will perform bed mobility as precursor to ADL with min guard assist  OT Frequency: Min 3X/week   Barriers to D/C:            Co-evaluation              AM-PAC PT "6 Clicks" Daily Activity      Outcome Measure Help from another person eating meals?: A Lot Help from another person taking care of personal grooming?: A Lot Help from another person toileting, which includes using toliet, bedpan, or urinal?: Total Help from another person bathing (including washing, rinsing, drying)?: A Lot Help from another person to put on and taking off regular upper body clothing?: A Lot Help from another person to put on and taking off regular lower body clothing?: Total 6 Click Score: 10   End of Session Equipment Utilized During Treatment: Gait belt Nurse Communication: Mobility status  Activity Tolerance: Patient tolerated treatment well Patient left: in chair;with call bell/phone within reach;with chair alarm set;with nursing/sitter in room  OT Visit Diagnosis: Unsteadiness on feet (R26.81);Other symptoms and signs involving cognitive function;Muscle weakness (generalized) (M62.81);Other symptoms and signs involving the nervous system (R29.898)                Time: 1350-1440 OT Time Calculation (min): 50 min Charges:  OT General Charges $OT Visit: 1 Procedure OT Evaluation $OT Eval Moderate Complexity: 1 Procedure OT Treatments $Self Care/Home Management : 23-37 mins G-Codes:     Sherryl MangesLaura Pershing Skidmore OTR/L [phone]  Evern BioLaura J Keiasha Diep 06/06/2017, 3:15 PM   Addendum: G Codes added   06/06/17 1500  OT G-codes **  NOT FOR INPATIENT CLASS**  Functional Assessment Tool Used AM-PAC 6 Clicks Daily Activity  Functional Limitation Self care  Self Care Current Status (U9811) CL  Self Care Goal Status (B1478) CJ

## 2017-06-06 NOTE — Progress Notes (Signed)
Rehab admissions - patient and daughter known to me from previous stay on CIR in march 2018.  I spoke with daughter.  Daughter would like inpatient rehab admission.  I spoke with attending MD who says patient is medically ready.  Bed available and will admit to acute inpatient rehab today.  Call me for questions.  #[phone]

## 2017-06-06 NOTE — Care Management Note (Signed)
Case Management Note  Patient Details  Name: Christina [name] MRN: [id number] Date of Birth: 11-16-1946  Subjective/Objective:                    Action/Plan: Pt discharging to CIR today. No further needs per CM.  Expected Discharge Date:  06/06/17               Expected Discharge Plan:  IP Rehab Facility  In-House Referral:  Clinical Social Work  Discharge planning Services  CM Consult  Post Acute Care Choice:    Choice offered to:     DME Arranged:    DME Agency:     HH Arranged:    HH Agency:     Status of Service:  Completed, signed off  If discussed at [employer] Meetings, dates discussed:    Additional Comments:  Kermit BaloKelli F Jasmyne Lodato, RN 06/06/2017, 4:22 PM

## 2017-06-06 NOTE — Consult Note (Signed)
Physical Medicine and Rehabilitation Consult Reason for Consult: Decreased functional mobility Referring Physician: [facility]   HPI: Christina [name] is a 71 y.o. 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 Present 06/04/2017 with mental status/non- as well as recent fall without loss of consciousness. CT/MRI showed multifocal acute ischemia within the largest area involving the right pericallosal white matter and left frontal operculum. No hemorrhage or mass effect. Old right frontal infarct and multiple small old cerebellar infarcts. Echocardiogram with ejection fraction of 65% grade 1 diastolic dysfunction . Patient did not receive TPA. Patient currently remains on Eliquis. Tolerating a regular diet. Physical therapy evaluation completed with recommendations of physical medicine rehabilitation consult.   Review of Systems  Unable to perform ROS: Acuity of condition   Past Medical History:  Diagnosis Date  . Hypertension   . PAF (paroxysmal atrial fibrillation) (HCC)   . Stroke [facility])    Past Surgical History:  Procedure Laterality Date  . ABDOMINAL HYSTERECTOMY    . EYE SURGERY     Family History  Problem Relation Age of Onset  . Heart disease Mother   . Diabetes Mother   . Heart disease Father   . Heart disease Sister   . Heart disease Sister    Social History:  reports that she has quit smoking. She has never used smokeless tobacco. She reports that she does not drink alcohol or use drugs. Allergies: No Known Allergies Medications Prior to Admission  Medication Sig Dispense Refill  . acetaminophen (TYLENOL) 500 MG tablet Take 2 tablets (1,000 mg total) by mouth every 6 (six) hours as  needed. 30 tablet 0  . apixaban (ELIQUIS) 5 MG TABS tablet Take 1 tablet (5 mg total) by mouth 2 (two) times daily. 60 tablet 2  . carvedilol (COREG) 25 MG tablet Take 1 tablet (25 mg total) by mouth 2 (two) times daily with a meal. 60 tablet 2  . cephALEXin (KEFLEX) 500 MG capsule Take 2 capsules (1,000 mg total) by mouth 2 (two) times daily. 28 capsule 0  . FLUoxetine (PROZAC) 40 MG capsule Take 1 capsule (40 mg total) by mouth daily. 30 capsule 2  . hydrochlorothiazide (HYDRODIURIL) 25 MG tablet Take 1 tablet (25 mg total) by mouth daily. 30 tablet 1  . latanoprost (XALATAN) 0.005 % ophthalmic solution Place 1 drop into both eyes every morning. 2.5 mL 12  . lidocaine (LIDODERM) 5 % Place 1 patch onto the skin daily. Remove & Discard patch within 12 hours or as directed by MD 30 patch 0  . losartan (COZAAR) 100 MG tablet Take 1 tablet (100 mg total) by mouth daily. 30 tablet 1  . mirtazapine (REMERON SOL-TAB) 15 MG disintegrating tablet Take 1 tablet (15 mg total) by mouth at bedtime. 30 tablet 2  . nitrofurantoin, macrocrystal-monohydrate, (MACROBID) 100 MG capsule Take 1 capsule (100 mg total) by mouth 2 (two) times daily. 10 capsule 0  . potassium chloride SA (K-DUR,KLOR-CON) 20 MEQ tablet Take 1 tablet (20 mEq total) by mouth daily. 30 tablet 1  . senna-docusate (SENOKOT-S) 8.6-50 MG tablet Take 1 tablet by mouth at bedtime as needed for moderate constipation. (Patient taking differently: Take 1 tablet by mouth at bedtime as needed for moderate constipation. ) 30 tablet 1  .  simvastatin (ZOCOR) 40 MG tablet Take 1 tablet (40 mg total) by mouth daily. 90 tablet 0  . traMADol (ULTRAM) 50 MG tablet Take 2 tablets (100 mg total) by mouth every 12 (twelve) hours as needed for moderate pain. 20 tablet 0  . verapamil (CALAN-SR) 180 MG CR tablet Take 1 tablet (180 mg total) by mouth 2 (two) times daily. 60 tablet 2  . VOLTAREN 1 % GEL Apply 2 g topically 4 (four) times daily. 100 g 1    Home: Home  Living Family/patient expects to be discharged to:: Private residence Living Arrangements: Children Available Help at Discharge: Family, Available 24 hours/day Type of Home: House Home Access: Level entry Home Layout: One level Bathroom Shower/Tub: Tub/shower unit Home Equipment: Environmental consultant - 2 wheels, Bedside commode  Functional History: Prior Function Level of Independence: Independent with assistive device(s) Gait / Transfers Assistance Needed: pt's daughter reported that she was ambulating with use of RW ADL's / Homemaking Assistance Needed: independent (per daughter) Functional Status:  Mobility: Bed Mobility Overal bed mobility: Needs Assistance Bed Mobility: Supine to Sit Supine to sit: Mod assist General bed mobility comments: increased time and effort, mod A to achieve sitting EOB Transfers Overall transfer level: Needs assistance Equipment used: 2 person hand held assist Transfers: Sit to/from Stand, Stand Pivot Transfers Sit to Stand: Max assist Stand pivot transfers: Max assist General transfer comment: increased time and effort, verbal and tactile cueing for sequencing, max A to rise into standing from sitting EOB and max A with pivotal movements from bed<>BSC<>recliner      ADL:    Cognition: Cognition Overall Cognitive Status: Difficult to assess Orientation Level: Other (comment) Cognition Arousal/Alertness: Awake/alert Behavior During Therapy: Flat affect Overall Cognitive Status: Difficult to assess General Comments: pt did no respond verbally or non-verbally to any questions or commands Difficult to assess due to: Impaired communication  Blood pressure (!) 174/84, pulse 79, temperature 98.5 F (36.9 C), temperature source Oral, resp. rate 20, weight 60.7 kg (133 lb 14.4 oz), SpO2 100 %. Physical Exam  HENT:  Healing abrasions to the nose and face  Eyes:  Pupils round and reactive to light  Neck: Normal range of motion. Neck supple. No thyromegaly  present.  Cardiovascular:  Cardiac rate controlled  Respiratory: Effort normal and breath sounds normal. No respiratory distress.  GI: Soft. Bowel sounds are normal. She exhibits no distension.  Neurological: She is alert.  Patient is nonverbal. Expressive>receptive language deficits. She will make eye contact. Easily distracted. She grasped with both hands when cued but perseverated on task and could not move on to another command. She did use some yes and no head nods but inconsistent. Moves all 4 limbs. Seems to sense pain  Skin: Skin is warm and dry.  Psychiatric:  flat    Results for orders placed or performed during the hospital encounter of 06/04/17 (from the past 24 hour(s))  Magnesium     Status: Abnormal   Collection Time: 06/05/17  7:28 AM  Result Value Ref Range   Magnesium 1.5 (L) 1.7 - 2.4 mg/dL   Ct Head Wo Contrast  Result Date: 06/04/2017 CLINICAL DATA:  Status post fall. No loss of consciousness. Confused. EXAM: CT HEAD WITHOUT CONTRAST CT CERVICAL SPINE WITHOUT CONTRAST TECHNIQUE: Multidetector CT imaging of the head and cervical spine was performed following the standard protocol without intravenous contrast. Multiplanar CT image reconstructions of the cervical spine were also generated. COMPARISON:  05/01/2017 FINDINGS: CT HEAD FINDINGS Brain: No evidence of  acute infarction, hemorrhage, extra-axial collection, ventriculomegaly, or mass effect. Old right frontal lobe infarct with encephalomalacia. Small area of encephalomalacia in the left parietal lobe. Generalized cerebral atrophy. Periventricular white matter low attenuation likely secondary to microangiopathy. Vascular: Cerebrovascular atherosclerotic calcifications are noted. Skull: Negative for fracture or focal lesion. Sinuses/Orbits: Visualized portions of the orbits are unremarkable. Visualized portions of the paranasal sinuses and mastoid air cells are unremarkable. Other: None. CT CERVICAL SPINE FINDINGS Alignment:  Normal. Skull base and vertebrae: No acute fracture. No primary bone lesion or focal pathologic process. Soft tissues and spinal canal: No prevertebral fluid or swelling. No visible canal hematoma. Disc levels: Degenerative disc disease with disc height loss C4-5, C5-6 and C6-7. Mild bilateral uncovertebral degenerative changes at C4-5, C5-6 and C6-7. Mild left foraminal stenosis at C4-5. Mild bilateral foraminal stenosis at C5-6. Mild right foraminal stenosis at C6-7. Upper chest: Lung apices are clear. Other: Bilateral carotid artery atherosclerosis. IMPRESSION: 1. No acute intracranial pathology. 2. No acute osseous injury of the cervical spine. 3. Cervical spine spondylosis as described above. Electronically Signed   By: Elige Ko   On: 06/04/2017 12:19   Ct Cervical Spine Wo Contrast  Result Date: 06/04/2017 CLINICAL DATA:  Status post fall. No loss of consciousness. Confused. EXAM: CT HEAD WITHOUT CONTRAST CT CERVICAL SPINE WITHOUT CONTRAST TECHNIQUE: Multidetector CT imaging of the head and cervical spine was performed following the standard protocol without intravenous contrast. Multiplanar CT image reconstructions of the cervical spine were also generated. COMPARISON:  05/01/2017 FINDINGS: CT HEAD FINDINGS Brain: No evidence of acute infarction, hemorrhage, extra-axial collection, ventriculomegaly, or mass effect. Old right frontal lobe infarct with encephalomalacia. Small area of encephalomalacia in the left parietal lobe. Generalized cerebral atrophy. Periventricular white matter low attenuation likely secondary to microangiopathy. Vascular: Cerebrovascular atherosclerotic calcifications are noted. Skull: Negative for fracture or focal lesion. Sinuses/Orbits: Visualized portions of the orbits are unremarkable. Visualized portions of the paranasal sinuses and mastoid air cells are unremarkable. Other: None. CT CERVICAL SPINE FINDINGS Alignment: Normal. Skull base and vertebrae: No acute fracture. No  primary bone lesion or focal pathologic process. Soft tissues and spinal canal: No prevertebral fluid or swelling. No visible canal hematoma. Disc levels: Degenerative disc disease with disc height loss C4-5, C5-6 and C6-7. Mild bilateral uncovertebral degenerative changes at C4-5, C5-6 and C6-7. Mild left foraminal stenosis at C4-5. Mild bilateral foraminal stenosis at C5-6. Mild right foraminal stenosis at C6-7. Upper chest: Lung apices are clear. Other: Bilateral carotid artery atherosclerosis. IMPRESSION: 1. No acute intracranial pathology. 2. No acute osseous injury of the cervical spine. 3. Cervical spine spondylosis as described above. Electronically Signed   By: Elige Ko   On: 06/04/2017 12:19   Mr Brain Wo Contrast  Result Date: 06/04/2017 CLINICAL DATA:  Aphasia.  Atrial fibrillation. EXAM: MRI HEAD WITHOUT CONTRAST TECHNIQUE: Multiplanar, multiecho pulse sequences of the brain and surrounding structures were obtained without intravenous contrast. COMPARISON:  Head CT 06/04/2017 FINDINGS: Brain: The midline structures are normal. There is multifocal diffusion restriction including punctate focus in the right cerebellum, larger area in the left frontal operculum and insula and a similar size area of the right cingulum/pericallosal white matter. There are multiple old cerebellar infarcts and bilateral basal ganglia lacunar infarcts. There is right frontal pole encephalomalacia predominantly involving the medial frontal gyrus. There is beginning confluent hyperintense T2-weighted signal within the periventricular white matter, most often seen in the setting of chronic microvascular ischemia. No intraparenchymal hematoma or chronic microhemorrhage. Atrophy is greater  than expected for age without clear lobar predominance. The dura is normal and there is no extra-axial collection. Vascular: Major intracranial arterial and venous sinus flow voids are preserved. Skull and upper cervical spine: The  Sinuses/Orbits: Left maxillary sinus mucosal thickening. Bilateral lens replacements. IMPRESSION: 1. Multifocal acute ischemia with the largest areas involving the right pericallosal white matter and left frontal operculum/and slow with a punctate focus of ischemia in the right cerebellum. 2. No hemorrhage or mass effect. 3. Old right frontal infarct and multiple small old cerebellar infarcts superimposed on chronic microvascular ischemia. Electronically Signed   By: Deatra RobinsonKevin  Herman M.D.   On: 06/04/2017 17:26    Assessment/Plan: Diagnosis: left pericallosal/frontal operculum infarct with increase weakness and expressive>receptive language defcits 1. Does the need for close, 24 hr/day medical supervision in concert with the patient's rehab needs make it unreasonable for this patient to be served in a less intensive setting? Yes 2. Co-Morbidities requiring supervision/potential complications: HTN, depression, hypokalemia 3. Due to bladder management, bowel management, safety, skin/wound care, disease management, medication administration, pain management and patient education, does the patient require 24 hr/day rehab nursing? Yes 4. Does the patient require coordinated care of a physician, rehab nurse, PT (1-2 hrs/day, 5 days/week), OT (1-2 hrs/day, 5 days/week) and SLP (1-2 hrs/day, 5 days/week) to address physical and functional deficits in the context of the above medical diagnosis(es)? Yes Addressing deficits in the following areas: balance, endurance, locomotion, strength, transferring, bowel/bladder control, bathing, dressing, feeding, grooming, toileting, cognition, language and psychosocial support 5. Can the patient actively participate in an intensive therapy program of at least 3 hrs of therapy per day at least 5 days per week? Yes 6. The potential for patient to make measurable gains while on inpatient rehab is good 7. Anticipated functional outcomes upon discharge from inpatient rehab are  supervision to min assist  with PT, supervision to min assist with OT, supervision and min assist/mod assist with SLP. 8. Estimated rehab length of stay to reach the above functional goals is: 15-20 days 9. Anticipated D/C setting: Home 10. Anticipated post D/C treatments: HH therapy 11. Overall Rehab/Functional Prognosis: excellent  RECOMMENDATIONS: This patient's condition is appropriate for continued rehabilitative care in the following setting: CIR Patient has agreed to participate in recommended program. Yes Note that insurance prior authorization may be required for reimbursement for recommended care.  Comment: Rehab Admissions Coordinator to follow up.  Thanks,  Ranelle OysterZachary T. Leamon Palau, MD, [place] DomFAAPMR    Charlton AmorANGIULLI,DANIEL J., PA-C 06/06/2017

## 2017-06-06 NOTE — Discharge Summary (Signed)
Physician Discharge Summary  [place] WUJ:[id number] DOB: 07-26-1946 DOA: 06/04/2017  PCP: Lizbeth Bark, FNP  Admit date: 06/04/2017 Discharge date: 06/06/2017  Time spent: 35 minutes  Recommendations for Outpatient Follow-up:  Maintain adequate hydration  Rehabilitation and physical therapy/SPL as per CIR protocol  Repeat BMET in 5 days to follow electrolytes and renal function  Close follow up to BP and adjust medications as needed for blood pressure control Dysphagia 3 diet with thin liquids   Discharge Diagnoses:  Principal Problem:   Fall at home Active Problems:   Uncontrolled hypertension   Altered mental status   History of CVA in adulthood   AF (paroxysmal atrial fibrillation) (HCC)   CKD (chronic kidney disease), stage III   Depression   Discharge Condition: stable and improved. Discharge to CIR for further care and rehabilitation.   Diet recommendation: heart healthy diet; dysphagia 3 with thin liquids   Filed Weights   06/05/17 0500  Weight: 60.7 kg (133 lb 14.4 oz)    Brief History of present illness:  71 y/o female originally from [place]; who had PMH significant for depression, HTN, HLD, PAF and prior CVA; who presented with non verbal event after mechanical fall at home. Found to have ischemic stroke affecting pericallosal white matter and left operculum.  Hospital Course:  1-acute ischemic stroke involving pericallosal white matter and left frontal operculum. -will complete work up -patient with hx of PAF, chronically on eliquis (but not compliant lately) -after discussing with family/caregiver and emphasizing importance about medication compliance; they expressed finding pills on the floor and not been sure if she has always taken her anticoagulant.  -continue eliquis BID for secondary prevention  -will follow neurology rec's and no further work up anticipated at this time.   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 Patient PO intake poor and and a high risk to develop serious ARF  3-HLD  -will continue statins  4-hypokalemia and  Hypomagnesemia -repleted and WNL at discharge -will resume daily potassium supplementation.  5-depression -will continue prozac and remeron   6-CKD stage 3 -overall stable and slightly up from baseline; even GFR at baseline  -will minimize/avoid nephrotoxic agents -guaranteed good hydration -repeat BMET in 5 days -heart healthy diet and BP control    7-Recurrent cystitis/UTI -no active infection appreciated this time -will continue suppressive therapy with macrobid.  Procedures:  See below for x-ray reports   Consultations:  Neurology  CIR  Discharge Exam: Vitals:   06/06/17 0900 06/06/17 1400  BP: (!) 168/74 (!) 173/75  Pulse: 71 64  Resp: 18 20  Temp: 98.6 F (37 C) 98.7 F (37.1 C)     General: afebrile, continue to be aphasic, in no acute distress. Patient with left side negligence; with improvement in following commands/interacting more, but not talking.  Cardiovascular: regular rate, no rubs, no gallops, no murmur  Respiratory: good air movement, no wheezing, no crackles  Abdomen: soft, NT, ND, positive BS  Musculoskeletal: no edema, no cyanosis, no clubbing   Neurology: CN grossly intact, patient has remained aphasic, participating more following commands and with overall better interaction, left side neglection appreciated.   Discharge Instructions   Discharge Instructions    Diet - low sodium heart healthy    Complete by:  As directed    Discharge instructions  Complete by:  As directed    Maintain adequate hydration  Rehabilitation and physical therapy/SPL as per CIR protocol  Repeat BMET in 5 days to follow electrolytes and renal function  Close follow up  to BP and adjust medications as needed for blood pressure control Dysphagia 3 diet with thin liquids     Current Discharge Medication List    START taking these medications   Details  atorvastatin (LIPITOR) 20 MG tablet Take 1 tablet (20 mg total) by mouth daily at 6 PM.    isosorbide-hydrALAZINE (BIDIL) 20-37.5 MG tablet Take 2 tablets by mouth 2 (two) times daily.      CONTINUE these medications which have NOT CHANGED   Details  acetaminophen (TYLENOL) 500 MG tablet Take 2 tablets (1,000 mg total) by mouth every 6 (six) hours as needed. Qty: 30 tablet, Refills: 0   Associated Diagnoses: Musculoskeletal leg pain, right    apixaban (ELIQUIS) 5 MG TABS tablet Take 1 tablet (5 mg total) by mouth 2 (two) times daily. Qty: 60 tablet, Refills: 2   Associated Diagnoses: History of CVA with residual deficit    carvedilol (COREG) 25 MG tablet Take 1 tablet (25 mg total) by mouth 2 (two) times daily with a meal. Qty: 60 tablet, Refills: 2   Associated Diagnoses: Essential hypertension    FLUoxetine (PROZAC) 40 MG capsule Take 1 capsule (40 mg total) by mouth daily. Qty: 30 capsule, Refills: 2   Associated Diagnoses: Anxiety    latanoprost (XALATAN) 0.005 % ophthalmic solution Place 1 drop into both eyes every morning. Qty: 2.5 mL, Refills: 12    lidocaine (LIDODERM) 5 % Place 1 patch onto the skin daily. Remove & Discard patch within 12 hours or as directed by MD Qty: 30 patch, Refills: 0    mirtazapine (REMERON SOL-TAB) 15 MG disintegrating tablet Take 1 tablet (15 mg total) by mouth at bedtime. Qty: 30 tablet, Refills: 2   Associated Diagnoses: Anxiety    nitrofurantoin, macrocrystal-monohydrate, (MACROBID) 100 MG capsule Take 1 capsule (100 mg total) by mouth 2 (two) times daily. Qty: 10 capsule, Refills: 0   Associated Diagnoses: Acute cystitis without hematuria    potassium chloride SA (K-DUR,KLOR-CON) 20 MEQ tablet Take 1 tablet (20 mEq total) by mouth daily. Qty: 30  tablet, Refills: 1    senna-docusate (SENOKOT-S) 8.6-50 MG tablet Take 1 tablet by mouth at bedtime as needed for moderate constipation. Qty: 30 tablet, Refills: 1    verapamil (CALAN-SR) 180 MG CR tablet Take 1 tablet (180 mg total) by mouth 2 (two) times daily. Qty: 60 tablet, Refills: 2   Associated Diagnoses: Essential hypertension    VOLTAREN 1 % GEL Apply 2 g topically 4 (four) times daily. Qty: 100 g, Refills: 1      STOP taking these medications     cephALEXin (KEFLEX) 500 MG capsule      hydrochlorothiazide (HYDRODIURIL) 25 MG tablet      losartan (COZAAR) 100 MG tablet      simvastatin (ZOCOR) 40 MG tablet      traMADol (ULTRAM) 50 MG tablet        No Known Allergies   The results of significant diagnostics from this hospitalization (including imaging, microbiology, ancillary and laboratory) are listed below for reference.    Significant Diagnostic Studies: Dg Chest 2 View  Result Date: 05/27/2017 CLINICAL DATA:  Psychiatric medical clearance. EXAM: CHEST  2 VIEW COMPARISON:  05/01/2017 FINDINGS: Patient slightly rotated to the left as there is also slight  rotation on the lateral film. Lungs are adequately inflated without focal airspace consolidation or effusion. Stable subcentimeter dense nodule over the right apex likely granuloma. Mild stable cardiomegaly. Calcified plaque is present over the thoracic aorta. There are degenerative changes of the spine. IMPRESSION: No active cardiopulmonary disease. Electronically Signed   By: Elberta Fortisaniel  Boyle M.D.   On: 05/27/2017 17:57   Ct Head Wo Contrast  Result Date: 06/04/2017 CLINICAL DATA:  Status post fall. No loss of consciousness. Confused. EXAM: CT HEAD WITHOUT CONTRAST CT CERVICAL SPINE WITHOUT CONTRAST TECHNIQUE: Multidetector CT imaging of the head and cervical spine was performed following the standard protocol without intravenous contrast. Multiplanar CT image reconstructions of the cervical spine were also generated.  COMPARISON:  05/01/2017 FINDINGS: CT HEAD FINDINGS Brain: No evidence of acute infarction, hemorrhage, extra-axial collection, ventriculomegaly, or mass effect. Old right frontal lobe infarct with encephalomalacia. Small area of encephalomalacia in the left parietal lobe. Generalized cerebral atrophy. Periventricular white matter low attenuation likely secondary to microangiopathy. Vascular: Cerebrovascular atherosclerotic calcifications are noted. Skull: Negative for fracture or focal lesion. Sinuses/Orbits: Visualized portions of the orbits are unremarkable. Visualized portions of the paranasal sinuses and mastoid air cells are unremarkable. Other: None. CT CERVICAL SPINE FINDINGS Alignment: Normal. Skull base and vertebrae: No acute fracture. No primary bone lesion or focal pathologic process. Soft tissues and spinal canal: No prevertebral fluid or swelling. No visible canal hematoma. Disc levels: Degenerative disc disease with disc height loss C4-5, C5-6 and C6-7. Mild bilateral uncovertebral degenerative changes at C4-5, C5-6 and C6-7. Mild left foraminal stenosis at C4-5. Mild bilateral foraminal stenosis at C5-6. Mild right foraminal stenosis at C6-7. Upper chest: Lung apices are clear. Other: Bilateral carotid artery atherosclerosis. IMPRESSION: 1. No acute intracranial pathology. 2. No acute osseous injury of the cervical spine. 3. Cervical spine spondylosis as described above. Electronically Signed   By: Elige KoHetal  Patel   On: 06/04/2017 12:19   Ct Cervical Spine Wo Contrast  Result Date: 06/04/2017 CLINICAL DATA:  Status post fall. No loss of consciousness. Confused. EXAM: CT HEAD WITHOUT CONTRAST CT CERVICAL SPINE WITHOUT CONTRAST TECHNIQUE: Multidetector CT imaging of the head and cervical spine was performed following the standard protocol without intravenous contrast. Multiplanar CT image reconstructions of the cervical spine were also generated. COMPARISON:  05/01/2017 FINDINGS: CT HEAD FINDINGS  Brain: No evidence of acute infarction, hemorrhage, extra-axial collection, ventriculomegaly, or mass effect. Old right frontal lobe infarct with encephalomalacia. Small area of encephalomalacia in the left parietal lobe. Generalized cerebral atrophy. Periventricular white matter low attenuation likely secondary to microangiopathy. Vascular: Cerebrovascular atherosclerotic calcifications are noted. Skull: Negative for fracture or focal lesion. Sinuses/Orbits: Visualized portions of the orbits are unremarkable. Visualized portions of the paranasal sinuses and mastoid air cells are unremarkable. Other: None. CT CERVICAL SPINE FINDINGS Alignment: Normal. Skull base and vertebrae: No acute fracture. No primary bone lesion or focal pathologic process. Soft tissues and spinal canal: No prevertebral fluid or swelling. No visible canal hematoma. Disc levels: Degenerative disc disease with disc height loss C4-5, C5-6 and C6-7. Mild bilateral uncovertebral degenerative changes at C4-5, C5-6 and C6-7. Mild left foraminal stenosis at C4-5. Mild bilateral foraminal stenosis at C5-6. Mild right foraminal stenosis at C6-7. Upper chest: Lung apices are clear. Other: Bilateral carotid artery atherosclerosis. IMPRESSION: 1. No acute intracranial pathology. 2. No acute osseous injury of the cervical spine. 3. Cervical spine spondylosis as described above. Electronically Signed   By: Elige KoHetal  Patel   On: 06/04/2017 12:19   Mr  Brain Wo Contrast  Result Date: 06/04/2017 CLINICAL DATA:  Aphasia.  Atrial fibrillation. EXAM: MRI HEAD WITHOUT CONTRAST TECHNIQUE: Multiplanar, multiecho pulse sequences of the brain and surrounding structures were obtained without intravenous contrast. COMPARISON:  Head CT 06/04/2017 FINDINGS: Brain: The midline structures are normal. There is multifocal diffusion restriction including punctate focus in the right cerebellum, larger area in the left frontal operculum and insula and a similar size area of the  right cingulum/pericallosal white matter. There are multiple old cerebellar infarcts and bilateral basal ganglia lacunar infarcts. There is right frontal pole encephalomalacia predominantly involving the medial frontal gyrus. There is beginning confluent hyperintense T2-weighted signal within the periventricular white matter, most often seen in the setting of chronic microvascular ischemia. No intraparenchymal hematoma or chronic microhemorrhage. Atrophy is greater than expected for age without clear lobar predominance. The dura is normal and there is no extra-axial collection. Vascular: Major intracranial arterial and venous sinus flow voids are preserved. Skull and upper cervical spine: The Sinuses/Orbits: Left maxillary sinus mucosal thickening. Bilateral lens replacements. IMPRESSION: 1. Multifocal acute ischemia with the largest areas involving the right pericallosal white matter and left frontal operculum/and slow with a punctate focus of ischemia in the right cerebellum. 2. No hemorrhage or mass effect. 3. Old right frontal infarct and multiple small old cerebellar infarcts superimposed on chronic microvascular ischemia. Electronically Signed   By: Deatra Robinson M.D.   On: 06/04/2017 17:26   Mr Brain Wo Contrast  Result Date: 05/22/2017 CLINICAL DATA:  Acute presentation with slurred speech and altered mental status. EXAM: MRI HEAD WITHOUT CONTRAST TECHNIQUE: Multiplanar, multiecho pulse sequences of the brain and surrounding structures were obtained without intravenous contrast. COMPARISON:  05/01/2017.  03/14/2017. FINDINGS: Brain: Diffusion imaging does not show any acute or subacute extension since the previous exams. Mild chronic small-vessel ischemic change affects the pons. There are old small vessel cerebellar infarctions bilaterally. Old infarction in the right thalamus. Chronic small-vessel ischemic changes throughout the cerebral hemispheric white matter bilaterally. Scattered old infarctions  in the left posterior parietal/ occipital region. Expected evolutionary changes of previous infarction in the right anterior cerebral artery territory. No evidence of mass lesion, hemorrhage, hydrocephalus or extra-axial collection. Ventricular prominence is most likely secondary to central atrophy. Vascular: Major vessels at the base of the brain show flow. Skull and upper cervical spine: Negative Sinuses/Orbits: Clear/normal Other: None significant IMPRESSION: No acute or subacute extension since the previous exam. Expected evolutionary changes of bilateral infarctions sustained in March of this year as described above. Background pattern of advanced chronic small vessel disease throughout the brain. Electronically Signed   By: Paulina Fusi M.D.   On: 05/22/2017 08:41   Dg Hip Unilat W Or Wo Pelvis 2-3 Views Right  Result Date: 05/22/2017 CLINICAL DATA:  Right lateral proximal femur and hip pain. EXAM: DG HIP (WITH OR WITHOUT PELVIS) 2-3V RIGHT COMPARISON:  None. FINDINGS: There is no evidence of hip fracture or dislocation. There is no evidence of arthropathy or other focal bone abnormality. There is generalized osteopenia. IMPRESSION: No acute osseous injury of the right hip. Given the patient's age and osteopenia, if there is persistent clinical concern for an occult hip fracture, a MRI of the hip is recommended for increased sensitivity. Electronically Signed   By: Elige Ko   On: 05/22/2017 09:47    Microbiology: Recent Results (from the past 240 hour(s))  Culture, Urine     Status: None   Collection Time: 06/05/17  2:02 AM  Result Value  Ref Range Status   Specimen Description URINE, CATHETERIZED  Final   Special Requests NONE  Final   Culture NO GROWTH  Final   Report Status 06/06/2017 FINAL  Final     Labs: Basic Metabolic Panel:  Recent Labs Lab 06/04/17 1136 06/04/17 1859 06/05/17 0407 06/05/17 0728 06/06/17 0635  NA 136  --  135  --  135  K 3.9  --  3.1*  --  3.5  CL 103   --  102  --  103  CO2 23  --  23  --  23  GLUCOSE 114*  --  123*  --  121*  BUN 22*  --  18  --  26*  CREATININE 1.24*  --  1.19*  --  1.95*  CALCIUM 10.0  --  10.0  --  9.5  MG  --  1.6*  --  1.5* 2.2  PHOS  --  2.8  --   --   --    Liver Function Tests:  Recent Labs Lab 06/04/17 1136 06/05/17 0407  AST 18 18  ALT 12* 13*  ALKPHOS 60 64  BILITOT 0.5 0.8  PROT 7.6 7.1  ALBUMIN 4.0 3.7    Recent Labs Lab 06/04/17 1908  AMMONIA 22   CBC:  Recent Labs Lab 06/04/17 1136 06/05/17 0407  WBC 6.9 5.8  NEUTROABS 5.4  --   HGB 12.6 12.1  HCT 38.6 37.1  MCV 82.8 81.9  PLT 218 229   CBG:  Recent Labs Lab 06/04/17 1146  GLUCAP 111*    Signed:  Vassie Loll MD.  [facility] 06/06/2017, 3:53 PM

## 2017-06-06 NOTE — Progress Notes (Signed)
  Echocardiogram 2D Echocardiogram has been performed.  Christina [name] 06/06/2017, 8:38 AM
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
## 2017-06-07 ENCOUNTER — Inpatient Hospital Stay (HOSPITAL_COMMUNITY): Payer: Medicare Other | Admitting: Speech Pathology

## 2017-06-07 ENCOUNTER — Encounter (HOSPITAL_COMMUNITY): Payer: Self-pay

## 2017-06-07 ENCOUNTER — Inpatient Hospital Stay (HOSPITAL_COMMUNITY): Payer: Self-pay | Admitting: Physical Therapy

## 2017-06-07 ENCOUNTER — Inpatient Hospital Stay (HOSPITAL_COMMUNITY): Payer: Self-pay

## 2017-06-07 DIAGNOSIS — E785 Hyperlipidemia, unspecified: Secondary | ICD-10-CM

## 2017-06-07 DIAGNOSIS — F329 Major depressive disorder, single episode, unspecified: Secondary | ICD-10-CM | POA: Diagnosis present

## 2017-06-07 DIAGNOSIS — I69351 Hemiplegia and hemiparesis following cerebral infarction affecting right dominant side: Secondary | ICD-10-CM | POA: Diagnosis present

## 2017-06-07 DIAGNOSIS — Z87891 Personal history of nicotine dependence: Secondary | ICD-10-CM | POA: Diagnosis not present

## 2017-06-07 DIAGNOSIS — G253 Myoclonus: Secondary | ICD-10-CM | POA: Diagnosis present

## 2017-06-07 DIAGNOSIS — Z90710 Acquired absence of both cervix and uterus: Secondary | ICD-10-CM | POA: Diagnosis not present

## 2017-06-07 DIAGNOSIS — I6340 Cerebral infarction due to embolism of unspecified cerebral artery: Secondary | ICD-10-CM | POA: Diagnosis not present

## 2017-06-07 DIAGNOSIS — R001 Bradycardia, unspecified: Secondary | ICD-10-CM | POA: Diagnosis present

## 2017-06-07 DIAGNOSIS — R4701 Aphasia: Secondary | ICD-10-CM | POA: Diagnosis present

## 2017-06-07 DIAGNOSIS — I480 Paroxysmal atrial fibrillation: Secondary | ICD-10-CM

## 2017-06-07 DIAGNOSIS — L89152 Pressure ulcer of sacral region, stage 2: Secondary | ICD-10-CM | POA: Diagnosis present

## 2017-06-07 DIAGNOSIS — I169 Hypertensive crisis, unspecified: Secondary | ICD-10-CM | POA: Diagnosis not present

## 2017-06-07 DIAGNOSIS — R400 Somnolence: Secondary | ICD-10-CM | POA: Diagnosis not present

## 2017-06-07 DIAGNOSIS — Z79899 Other long term (current) drug therapy: Secondary | ICD-10-CM | POA: Diagnosis not present

## 2017-06-07 DIAGNOSIS — I10 Essential (primary) hypertension: Secondary | ICD-10-CM | POA: Diagnosis present

## 2017-06-07 DIAGNOSIS — W19XXXD Unspecified fall, subsequent encounter: Secondary | ICD-10-CM | POA: Diagnosis present

## 2017-06-07 DIAGNOSIS — N390 Urinary tract infection, site not specified: Secondary | ICD-10-CM | POA: Diagnosis present

## 2017-06-07 DIAGNOSIS — B964 Proteus (mirabilis) (morganii) as the cause of diseases classified elsewhere: Secondary | ICD-10-CM | POA: Diagnosis present

## 2017-06-07 DIAGNOSIS — Z7901 Long term (current) use of anticoagulants: Secondary | ICD-10-CM | POA: Diagnosis not present

## 2017-06-07 NOTE — Evaluation (Signed)
Physical Therapy Assessment and Plan  Patient Details  Name: Christina [name] MRN: [id number] Date of Birth: March 26, 1946  PT Diagnosis: Abnormality of gait, Coordination disorder, Hemiplegia dominant and Impaired cognition Rehab Potential: Good ELOS: 21-25 days    Today's Date: 06/07/2017 PT Individual Time:1300-1400    60 min   Problem List:  Patient Active Problem List   Diagnosis Date Noted  . Cerebellar infarct (Christina [name]) 06/06/2017  . Cerebral infarction due to embolism of cerebral artery (Christina [name])   . Uncontrolled hypertension 06/04/2017  . Fall at home 06/04/2017  . Altered mental status 06/04/2017  . History of CVA in adulthood 06/04/2017  . AF (paroxysmal atrial fibrillation) ([place]) 06/04/2017  . CKD (chronic kidney disease), stage III 06/04/2017  . Depression 06/04/2017  . Aphasia due to acute stroke (Christina [name])   . Encephalopathy, hypertensive 05/07/2017  . Enterococcus UTI 05/07/2017  . Hypertensive urgency 05/02/2017  . Hypertensive emergency 05/01/2017  . History of suicidal ideation   . Hypokalemia   . Suicide ideation   . Sleep disturbance   . PAF (paroxysmal atrial fibrillation) (Christina [name])   . Labile blood pressure   . AKI (acute kidney injury) (Christina [name])   . Stage 3 chronic kidney disease   . Acute blood loss anemia   . Benign essential HTN   . Adjustment disorder with mixed anxiety and depressed mood   . Embolic infarction (Christina [name]) 03/19/2017  . Left hemiparesis (Christina [name])   . Primary osteoarthritis of left knee   . Left knee pain   . Diplopia   . Acute cystitis without hematuria   . Acute CVA (cerebrovascular accident) (Christina [name])   . TIA (transient ischemic attack) 03/14/2017  . History of CVA (cerebrovascular accident) 03/14/2017  . HTN (hypertension) 03/14/2017  . HLD (hyperlipidemia) 03/14/2017  . 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 Hypertension   . PAF (paroxysmal atrial fibrillation) (Christina [name])   . Stroke Christina [name])     Past Surgical History:  Past Surgical History:  Procedure Laterality Date  . ABDOMINAL HYSTERECTOMY    . 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 Present 06/04/2017 withmental status/non-as well as recent fall without loss of consciousness. CT/MRI showed multifocal acute ischemia within the largest area involving the right pericallosal white matter and left frontal operculum. No hemorrhage or mass effect. Old right frontal infarct and multiple small old cerebellar infarcts. Echocardiogramwith ejection fraction of 50% grade 1 diastolic dysfunction. Patient did not receive TPA. Patient currently remains on Eliquis.  Patient transferred to CIR on 06/06/2017 .   Patient currently requires total with mobility secondary to muscle weakness, motor apraxia, decreased coordination and decreased motor planning, decreased visual perceptual skills, right side neglect, decreased motor planning and ideational apraxia, decreased initiation, decreased attention, decreased awareness, decreased problem solving, decreased safety awareness, decreased memory and delayed processing and decreased sitting balance, decreased standing balance, decreased postural control, hemiplegia and decreased balance strategies.  Prior to hospitalization, patient was supervision with mobility and lived with Daughter in a House home.  Home access is  Level entry.  Patient will benefit from skilled PT intervention to maximize safe functional mobility, minimize fall risk  and decrease caregiver burden for planned discharge home with 24 hour assist.  Anticipate patient will  benefit from follow up [place] at discharge.  PT - End of Session Activity Tolerance: Tolerates < 10 min activity, no significant change in vital signs Endurance Deficit: Yes PT Assessment Rehab Potential (ACUTE/IP ONLY): Good Barriers to Discharge: Decreased caregiver support PT Patient demonstrates impairments in the following area(s): Balance;Behavior;Endurance;Motor;Nutrition;Pain;Perception;Safety;Sensory;Skin Integrity;Other (comment) PT Transfers Functional Problem(s): Bed Mobility;Bed to Chair;Car;Furniture;Floor PT Locomotion Functional Problem(s): Ambulation;Wheelchair Mobility;Stairs PT Plan PT Intensity: Minimum of 1-2 x/day ,45 to 90 minutes PT Frequency: 5 out of 7 days PT Duration Estimated Length of Stay: 21-25 days  PT Treatment/Interventions: Ambulation/gait training;Community reintegration;DME/adaptive equipment instruction;Neuromuscular re-education;Stair training;Psychosocial support;UE/LE Strength taining/ROM;Wheelchair propulsion/positioning;UE/LE Coordination activities;Therapeutic Activities;Skin care/wound management;Pain management;Functional electrical stimulation;Discharge planning;Balance/vestibular training;Cognitive remediation/compensation;Disease management/prevention;Functional mobility training;Patient/family education;Splinting/orthotics;Therapeutic Exercise;Visual/perceptual remediation/compensation PT Transfers Anticipated Outcome(s): Min assist with LRAD  PT Locomotion Anticipated Outcome(s): Ambulatory at household level with LRAd  PT Recommendation Recommendations for Other Services: Neuropsych consult Follow Up Recommendations: Home health PT Patient destination: Home Equipment Recommended: To be determined  Skilled Therapeutic Intervention PT instructed patient in PT Evaluation and initiated treatment intervention; see below for results. PT educated patient in [place], rehab potential, rehab goals, and discharge recommendations.    PT  Evaluation Precautions/Restrictions Precautions Precautions: Fall Restrictions Weight Bearing Restrictions: No General   Vital Signs Pain Pain Assessment Faces Pain Scale: No hurt Home Living/Prior Functioning Home Living Family/patient expects to be discharged to:: Private residence Available Help at Discharge: Family;Available 24 hours/day Type of Home: House Home Access: Level entry Home Layout: One level Bathroom Shower/Tub: Chiropodist: Standard Bathroom Accessibility: Yes  Lives With: Daughter Prior Function Level of Independence: Other (comment) (supervision per chart review) Driving: No Vision/Perception  Vision - Assessment Alignment/Gaze Preference: Gaze right Perception Perception: Impaired Praxis Praxis: Impaired Praxis Impairment Details: Ideation;Initiation;Ideomotor;Perseveration  Cognition Overall Cognitive Status: Impaired/Different from baseline (All areas of cognition impacted and difficult to assess given inability to interact or open eyes) Arousal/Alertness:  (Pt with eyes closed but physically responsive ot spoon at mouth) Orientation Level: Oriented to person (Briefly opened eyes to name) Attention: Focused Focused Attention: Impaired Focused Attention Impairment: Verbal basic;Functional basic Sustained Attention: Impaired Sustained Attention Impairment: Verbal basic;Functional basic Memory: Impaired Awareness: Impaired Awareness Impairment: Intellectual impairment Problem Solving: Impaired Problem Solving Impairment: Verbal basic;Functional basic Safety/Judgment: Impaired Comments: All areas of cognition impacted and difficult to assess  Sensation Sensation Light Touch: Impaired by gross assessment Coordination Gross Motor Movements are Fluid and Coordinated: No Fine Motor Movements are Fluid and Coordinated: No Coordination and Movement Description: apraxia, decreased coordination Motor  Motor Motor:  Hemiplegia;Motor apraxia Motor - Skilled Clinical Observations: signicant apraxia  Mobility Bed Mobility Bed Mobility: Rolling Right;Rolling Left;Sit to Supine;Supine to Sit Rolling Right: 2: Max assist Rolling Left: 2: Max assist Supine to Sit: 1: +1 Total assist Sit to Supine: 1: +1 Total assist Transfers Transfers: Yes Sit to Stand: 2: Max Teacher, English as a foreign language Transfers: 2: Max assist Locomotion  Ambulation Ambulation: Yes Ambulation/Gait Assistance: 2: Max Wellsite geologist (Feet): 2 Feet Assistive device: Parallel bars Stairs / Additional Locomotion Stairs: No Wheelchair Mobility Wheelchair Mobility: Yes Wheelchair Assistance: 3: Building surveyor Details: Verbal cues for technique;Verbal cues for precautions/safety;Verbal cues for gait pattern;Verbal cues for safe use of DME/AE Distance: 30  Trunk/Postural Assessment  Cervical Assessment Cervical Assessment: Within Functional Limits Thoracic Assessment Thoracic Assessment: Exceptions to [facility] (rounded shoulders) Lumbar Assessment Lumbar Assessment: Exceptions to [address] [name] (posterior  pelvic tilt) Postural Control Postural Control: Deficits on evaluation (delayed reactions; difficulty to assess 2/2 to decreased arousal)  Balance Balance Balance Assessed: Yes Static Sitting Balance Static Sitting - Level of Assistance: 2: Max assist Static Standing Balance Static Standing - Level of Assistance: 2: Max assist Extremity Assessment      RLE Assessment RLE Assessment: Exceptions to [facility] (generalized weakness at least 4-/5 with functional movement) LLE Assessment LLE Assessment: Exceptions to [name] (generalized weakness. at least 4-/5 )   See Function Navigator for Current Functional Status.   Refer to Care Plan for Long Term Goals  Recommendations for other services: Neuropsych  Discharge Criteria: Patient will be discharged from PT if patient refuses treatment 3 consecutive times without medical  reason, if treatment goals not met, if there is a change in medical status, if patient makes no progress towards goals or if patient is discharged from [name].  The above assessment, treatment plan, treatment alternatives and goals were discussed and mutually agreed upon: by patient  Lorie Phenix 06/07/2017, 11:55 PM

## 2017-06-07 NOTE — Progress Notes (Signed)
Christina [name] is a 71 y.o. female 08/31/1946 [id number]  Subjective: No new complaints. No new problems. Slept well. Feeling sleepy per family  Objective: Vital signs in last 24 hours: Temp:  [98.4 F (36.9 C)-99.6 F (37.6 C)] 99.6 F (37.6 C) (06/16 0700) Pulse Rate:  [62-68] 62 (06/16 0700) Resp:  [16-20] 18 (06/16 0700) BP: (121-183)/(51-75) 122/52 (06/16 0700) SpO2:  [97 %-100 %] 97 % (06/16 0700) Weight:  [134 lb 14.7 oz (61.2 kg)] 134 lb 14.7 oz (61.2 kg) (06/15 2100) Weight change:  Last BM Date: 06/06/17  Intake/Output from previous day: No intake/output data recorded. Last cbgs: CBG (last 3)  No results for input(s): GLUCAP in the last 72 hours.   Physical Exam General: No apparent distress   HEENT: not dry Lungs: Normal effort. Lungs clear to auscultation, no crackles or wheezes. Cardiovascular: Regular rate and rhythm, no edema Abdomen: S/NT/ND; BS(+) Musculoskeletal:  unchanged Neurological: No new neurological deficits Wounds: N/A    Skin: clear  Aging changes Mental state: Alert, sleepy    Lab Results: BMET    Component Value Date/Time   NA 135 06/06/2017 0635   NA 142 05/08/2017 1026   K 3.5 06/06/2017 0635   CL 103 06/06/2017 0635   CO2 23 06/06/2017 0635   GLUCOSE 121 (H) 06/06/2017 0635   BUN 26 (H) 06/06/2017 0635   BUN 20 05/08/2017 1026   CREATININE 1.95 (H) 06/06/2017 0635   CALCIUM 9.5 06/06/2017 0635   GFRNONAA 25 (L) 06/06/2017 0635   GFRAA 29 (L) 06/06/2017 0635   CBC    Component Value Date/Time   WBC 5.8 06/05/2017 0407   RBC 4.53 06/05/2017 0407   HGB 12.1 06/05/2017 0407   HGB 10.5 (L) 04/08/2017 1435   HCT 37.1 06/05/2017 0407   HCT 32.8 (L) 04/08/2017 1435   PLT 229 06/05/2017 0407   PLT 235 04/08/2017 1435   MCV 81.9 06/05/2017 0407   MCV 86 04/08/2017 1435   MCH 26.7 06/05/2017 0407   MCHC 32.6 06/05/2017 0407   RDW 12.9 06/05/2017 0407   RDW 14.0 04/08/2017 1435   LYMPHSABS 1.0 06/04/2017 1136   LYMPHSABS  1.3 04/08/2017 1435   MONOABS 0.3 06/04/2017 1136   EOSABS 0.2 06/04/2017 1136   EOSABS 0.3 04/08/2017 1435   BASOSABS 0.0 06/04/2017 1136   BASOSABS 0.0 04/08/2017 1435    Studies/Results: No results found.  Medications: I have reviewed the patient's current medications.  Assessment/Plan:  1. L CVA - CIR. On Eliquis 2. L hemiparesis - CIR 3. DVT proph w/Eliquis 4. HTN - Coreg, Cozaar, Verapamil 5. PAF - Eliquis 6.Dyslipidemia - Lipitor 7. Somnolence - likely due to Remeron - would d/c tomorrow if not better    Length of stay, days: 1  Sonda PrimesAlex Marcellene Shivley , MD 06/07/2017, 12:49 PM

## 2017-06-07 NOTE — Evaluation (Signed)
Speech Language Pathology Assessment and Plan  Patient Details  Name: Christina [name] MRN: [id number] Date of Birth: 1946-10-20  SLP Diagnosis: Speech and Language deficits;Dysphagia  Rehab Potential: Fair ELOS: 3 weeks    Today's Date: 06/07/2017 SLP Individual Time: 0930-1030 SLP Individual Time Calculation (min): 60 min   Problem List:  Patient Active Problem List   Diagnosis Date Noted  . Cerebellar infarct ([place]) 06/06/2017  . Cerebral infarction due to embolism of cerebral artery ([place])   . Uncontrolled hypertension 06/04/2017  . Fall at home 06/04/2017  . Altered mental status 06/04/2017  . History of CVA in adulthood 06/04/2017  . AF (paroxysmal atrial fibrillation) ([place]) 06/04/2017  . CKD (chronic kidney disease), stage III 06/04/2017  . Depression 06/04/2017  . Aphasia due to acute stroke ([place])   . Encephalopathy, hypertensive 05/07/2017  . Enterococcus UTI 05/07/2017  . Hypertensive urgency 05/02/2017  . Hypertensive emergency 05/01/2017  . History of suicidal ideation   . Hypokalemia   . Suicide ideation   . Sleep disturbance   . PAF (paroxysmal atrial fibrillation) ([place])   . Labile blood pressure   . AKI (acute kidney injury) ([place])   . Stage 3 chronic kidney disease   . Acute blood loss anemia   . Benign essential HTN   . Adjustment disorder with mixed anxiety and depressed mood   . Embolic infarction ([place]) 03/19/2017  . Left hemiparesis ([place])   . Primary osteoarthritis of left knee   . Left knee pain   . Diplopia   . Acute cystitis without hematuria   . Acute CVA (cerebrovascular accident) ([place])   . TIA (transient ischemic attack) 03/14/2017  . History of CVA (cerebrovascular accident) 03/14/2017  . HTN (hypertension) 03/14/2017  . HLD (hyperlipidemia) 03/14/2017  . 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 Hypertension   . PAF (paroxysmal atrial fibrillation) ([place])   . Stroke [facility])     Past Surgical History:  Past Surgical History:  Procedure Laterality Date  . ABDOMINAL HYSTERECTOMY    . 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 Present 06/04/2017 withmental status/non-as well as recent fall without loss of consciousness. CT/MRI showed multifocal acute ischemia within the largest area involving the right pericallosal white matter and left frontal operculum. No hemorrhage or mass effect. Old right frontal infarct and multiple small old cerebellar infarcts. Echocardiogramwith ejection fraction of 74% grade 1 diastolic dysfunction. Patient did not receive TPA. Patient currently remains on Eliquis. Tolerating a regular diet. Physical therapy evaluation completed with recommendations of physical medicine rehabilitation consult.Patient was admitted for a comprehensive rehabilitation program on 06/06/17.   Comprehensive speech-language and bedside swallow evaluation completed on 06/07/17. Pt was laying in bed iwth son present. Pt didn't open eyes, attempt to voice or interact with SLP or family memebers throughout evaluations. However notes from acute care SLPs indicate that pt was able to feed herself with good initiation. During this evaluation, pt opened mouth apporpriately when spoon presentated at lips with good oral manipulation and chewing ofice chips. However, overall, pt held bolus of ice chips, thin liquids, nectar thick liquids and puree for >  10 seconds resulting in immediate coughing possibly related ot premature spillage of bolus or delayed swallow initiaiton. Pt displayed better  oral prep and more timely swallow initiaiton with puree and necatr thick liquids by spoon with no cues or verbal attention given to taks by SLP or family memebers. As a result, recommend downgrading diet to dyswphagia 1 with nectar thick liquids by spoon, medicine crushed in puree and full nusing supervision. Pt's cognitive linguistic abilities were difficult to assess during evaluation d/t pt remaining with eyes closed. Pt with history of  psychatric issues and cognitive impairments from previous CVA. Reocmmend skilled ST during this CIR period to further assess cognitive function, advance diet, increase funcitonal independence and reduce caregiver burden. Anticipate that pt will require SNF placement for further therapy and care at time of discharge.   Skilled Therapeutic Interventions          Skilled ST focused on completion of cognitive linguistic and bedside swallow evaluations, see above. SLP facilitated session by providing Total A to engage pt in opening her eyes, pt unable. Pt able to receive spoon when presented at lips appropriately and demonstrated significant s/s of aspiration with ice chips and thin liquids. Given that pt was not able to implement any strategies, trials of nectar thick liquids and puree were given. Pt with decreased overt s/s of aspiration and therefore I recommend downgrading diet to dysphagia 1 with nectar thick liquids by spoon and nursing supervision. Education provided to son and daughter and POC explained.    SLP Assessment  Patient will need skilled Speech Lanaguage Pathology Services during CIR admission    Recommendations  SLP Diet Recommendations: Dysphagia 1 (Puree);Nectar Liquid Administration via: Spoon Medication Administration: Crushed with puree Supervision: Staff to assist with self feeding;Full supervision/cueing for compensatory strategies Compensations: Minimize environmental distractions;Small sips/bites;Slow rate (Only feed if pt opens mouth to  presentation of spoon. ) Postural Changes and/or Swallow Maneuvers: Seated upright 90 degrees Oral Care Recommendations: Oral care BID Recommendations for Other Services: Neuropsych consult Patient destination: Home Follow up Recommendations: Home Health SLP;Skilled Nursing facility;24 hour supervision/assistance    SLP Frequency 3 to 5 out of 7 days   SLP Duration  SLP Intensity  SLP Treatment/Interventions 3 weeks  Minumum of 1-2 x/day, 30 to 90 minutes  Cognitive remediation/compensation;Cueing hierarchy;Dysphagia/aspiration precaution training;Environmental controls;Functional tasks;Internal/external aids;Medication managment;Multimodal communication approach;Patient/family education;Speech/Language facilitation;Therapeutic Activities    Pain Pain Assessment Faces Pain Scale: No hurt  Prior Functioning Cognitive/Linguistic Baseline: Baseline deficits Baseline deficit details: pt with cognitive deficits but per family communicating well Type of Home: House  Lives With: Daughter Available Help at Discharge: Family;Available 24 hours/day Vocation: Retired  Function:  Eating Eating   Modified Consistency Diet: Yes Eating Assist Level: Helper feeds patient           Cognition Comprehension Comprehension assist level: Understands basic less than 25% of the time/ requires cueing >75% of the time  Expression   Expression assist level: Expresses basis less than 25% of the time/requires cueing >75% of the time.  Social Interaction Social Interaction assist level: Interacts appropriately less than 25% of the time. May be withdrawn or combative.  Problem Solving Problem solving assist level: Solves basic less than 25% of the time - needs direction nearly all the time or does not effectively solve problems and may need a restraint for safety  Memory Memory assist level: Recognizes or recalls less than 25% of the time/requires cueing greater than 75% of the time   Short Term  Goals: Week 1: SLP Short Term Goal 1 (Week 1): Pt will consume dysphagia 1 diet with necatr thick liquids by spoon without overt s/s of aspiration to demonstrate readiness for diet upgrade. SLP Short Term Goal 2 (Week 1): Pt will consume trials of thin liquids with decreased oral holding and timely swallow initiation to demonstrate readiness for diet uprgrade or instrumental study. SLP Short Term Goal 3 (Week 1): Pt will open eyes and maintain arousal for 10 seconds with Max A cues.  SLP Short Term Goal 4 (Week 1): Pt will name common objects with 50% accuracy and Max A cues.  SLP Short Term Goal 5 (Week 1): Pt will follow 1 step simple directions related to basic ADL tasks in 50% of opportunities with Max A cues.  SLP Short Term Goal 6 (Week 1): Pt will initiate tasks related to basic ADLs in 50% of opportunities with Max A cues.   Refer to Care Plan for Long Term Goals  Recommendations for other services: Neuropsych  Discharge Criteria: Patient will be discharged from SLP if patient refuses treatment 3 consecutive times without medical reason, if treatment goals not met, if there is a change in medical status, if patient makes no progress towards goals or if patient is discharged from hospital.  The above assessment, treatment plan, treatment alternatives and goals were discussed and mutually agreed upon: by patient and by family   Happi B. Rutherford Nail, M.S., CCC-SLP Speech-Language Pathologist   Happi Dryden 06/07/2017, 12:10 PM

## 2017-06-07 NOTE — Plan of Care (Signed)
Problem: RH BOWEL ELIMINATION Goal: RH STG MANAGE BOWEL WITH ASSISTANCE STG Manage Bowel with Assistance.  Outcome: Progressing No bowel movement this shift Goal: RH STG MANAGE BOWEL W/MEDICATION W/ASSISTANCE STG Manage Bowel with Medication with Assistance.  Outcome: Progressing Patient is on Scheduled [place]  Problem: RH BLADDER ELIMINATION Goal: RH STG MANAGE BLADDER WITH ASSISTANCE STG Manage Bladder With Assistance  Outcome: Not Progressing Incontinent of bladder with total care  Problem: RH SKIN INTEGRITY Goal: RH STG SKIN FREE OF INFECTION/BREAKDOWN Outcome: Progressing No skin infection or breakdown noted  Problem: RH SAFETY Goal: RH STG ADHERE TO SAFETY PRECAUTIONS W/ASSISTANCE/DEVICE STG Adhere to Safety Precautions With Assistance/Device.  Outcome: Progressing Safety precaution and fall prevention maintained, son at bedside Goal: RH STG DECREASED RISK OF FALL WITH ASSISTANCE STG Decreased Risk of Fall With Assistance.  Outcome: Progressing No fall noted  Problem: RH PAIN MANAGEMENT Goal: RH STG PAIN MANAGED AT OR BELOW PT'S PAIN GOAL Outcome: Progressing No S/S of pain noted

## 2017-06-07 NOTE — Plan of Care (Signed)
Problem: RH BOWEL ELIMINATION Goal: RH STG MANAGE BOWEL WITH ASSISTANCE STG Manage Bowel with Assistance. Outcome: Not Progressing No BM this shift Goal: RH STG MANAGE BOWEL W/MEDICATION W/ASSISTANCE STG Manage Bowel with Medication with Assistance. Outcome: Progressing Had a BM prior to admission today  Problem: RH BLADDER ELIMINATION Goal: RH STG MANAGE BLADDER WITH ASSISTANCE STG Manage Bladder With Assistance Outcome: Not Progressing Incontinent of bladder  Problem: RH SKIN INTEGRITY Goal: RH STG SKIN FREE OF INFECTION/BREAKDOWN Outcome: Progressing No skin breakdown or infection noted  Problem: RH SAFETY Goal: RH STG ADHERE TO SAFETY PRECAUTIONS W/ASSISTANCE/DEVICE STG Adhere to Safety Precautions With Assistance/Device. Outcome: Progressing Safety precautions and fall prevention maintained, son at debside  Problem: RH COGNITION-NURSING Goal: RH STG ANTICIPATES NEEDS/CALLS FOR ASSIST W/ASSIST/CUES STG Anticipates Needs/Calls for Assist With Assistance/Cues. Outcome: Progressing Visually checked every 2 hours  Problem: RH PAIN MANAGEMENT Goal: RH STG PAIN MANAGED AT OR BELOW PT'S PAIN GOAL Outcome: Progressing Denies pain and discomfort

## 2017-06-07 NOTE — Evaluation (Signed)
Occupational Therapy Assessment and Plan  Patient Details  Name: Lenae Wherley MRN: [id number] Date of Birth: 05/22/1946  OT Diagnosis: abnormal posture, altered mental status, cognitive deficits, hemiplegia affecting non-dominant side and muscle weakness (generalized) Rehab Potential:   ELOS: 21-24 days   Today's Date: 06/07/2017 OT Individual Time: 0700-0800 OT Individual Time Calculation (min): 60 min     Problem List:  Patient Active Problem List   Diagnosis Date Noted  . Cerebellar infarct ([place]) 06/06/2017  . Cerebral infarction due to embolism of cerebral artery ([place])   . Uncontrolled hypertension 06/04/2017  . Fall at home 06/04/2017  . Altered mental status 06/04/2017  . History of CVA in adulthood 06/04/2017  . AF (paroxysmal atrial fibrillation) ([place]) 06/04/2017  . CKD (chronic kidney disease), stage III 06/04/2017  . Depression 06/04/2017  . Aphasia due to acute stroke ([place])   . Encephalopathy, hypertensive 05/07/2017  . Enterococcus UTI 05/07/2017  . Hypertensive urgency 05/02/2017  . Hypertensive emergency 05/01/2017  . History of suicidal ideation   . Hypokalemia   . Suicide ideation   . Sleep disturbance   . PAF (paroxysmal atrial fibrillation) ([place])   . Labile blood pressure   . AKI (acute kidney injury) ([place])   . Stage 3 chronic kidney disease   . Acute blood loss anemia   . Benign essential HTN   . Adjustment disorder with mixed anxiety and depressed mood   . 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 Left hemiparesis ([place])   . Primary osteoarthritis of left knee   . Left knee pain   . Diplopia   . Acute cystitis without hematuria   . Acute CVA (cerebrovascular accident) ([place])   . TIA (transient ischemic attack) 03/14/2017  . History of CVA (cerebrovascular accident) 03/14/2017  . HTN (hypertension) 03/14/2017  . HLD (hyperlipidemia) 03/14/2017  . 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    Past Medical History:  Past Medical History:  Diagnosis  Date  . Hypertension   . PAF (paroxysmal atrial fibrillation) ([place])   . Stroke [facility])    Past Surgical History:  Past Surgical History:  Procedure Laterality Date  . ABDOMINAL HYSTERECTOMY    . 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 Present 06/04/2017 withmental status/non-as well as recent fall without loss of consciousness. CT/MRI showed multifocal acute ischemia within the largest area involving the right pericallosal white matter and left frontal operculum. No hemorrhage or mass effect. Old right frontal infarct and multiple small old cerebellar infarcts. Echocardiogramwith ejection fraction of 37% grade 1 diastolic dysfunction. Patient did not receive TPA. Patient currently remains on Eliquis. Tolerating a regular diet. Physical therapy evaluation completed with recommendations of physical medicine rehabilitation consult.Patient was admitted for a comprehensive rehabilitation program .    Patient currently requires total with basic self-care skills secondary to muscle weakness, decreased cardiorespiratoy endurance, decreased coordination and decreased motor planning, decreased midline orientation and decreased motor planning and decreased initiation, decreased attention, decreased awareness, decreased problem solving, decreased safety awareness, decreased memory and delayed processing.  Prior to hospitalization, patient could complete ADLs/functional mobility with supervision.  Patient will benefit from skilled intervention to decrease level of assist with basic self-care skills and increase independence with  basic  self-care skills prior to discharge home with care partner.  Anticipate patient will require 24 hour supervision and minimal physical assistance and follow up home health.  OT - End of Session Endurance Deficit: Yes OT Assessment OT Patient demonstrates impairments in the following area(s): Balance;Cognition;Endurance;Motor;Perception;Safety;Sensory;Vision OT Basic ADL's Functional Problem(s): Grooming;Eating;Bathing;Dressing;Toileting OT Transfers Functional Problem(s): Toilet;Tub/Shower OT Plan OT Intensity: Minimum of 1-2 x/day, 45 to 90 minutes OT Frequency: 5 out of 7 days OT Duration/Estimated Length of Stay: 21-24 days OT Treatment/Interventions: Balance/vestibular training;Cognitive remediation/compensation;Discharge planning;DME/adaptive equipment instruction;Functional electrical stimulation;Functional mobility training;Neuromuscular re-education;Patient/family education;Self Care/advanced ADL retraining;Therapeutic Activities;Therapeutic Exercise;UE/LE Strength taining/ROM;UE/LE Coordination activities;Visual/perceptual remediation/compensation OT Self Feeding Anticipated Outcome(s): S OT Basic Self-Care Anticipated Outcome(s): MIN A OT Toileting Anticipated Outcome(s): MIN A OT Bathroom Transfers Anticipated Outcome(s): MIN A OT Recommendation Patient destination: Home Equipment Recommended: Tub/shower bench;To be determined   Skilled Therapeutic Intervention 1:1. Pt son present throughout session. Despite multimodal cues, sternal rub and cold wash cloth pt does not open eyes throughout session. Pt squeezes OT hand when asked to sit EOB. Pt supine-sitting EOB with MAX A. Pt sits EOB with MAX A for static sitting balance. OT directs son to place w/c set up for trnasfer and pt touches chair and points when asked if she wants to get in chair. Pt squat pivot transfer to chair with total A with VC for safety, sequencing and weight shifting. Ot waits for pt to initiate transfer. Pt keeps  eyes closed at all times. While seated in w/c, Pt opens mouth to presentation of food by son, chews and swallows, but not by therapist unless therapist does not speak before presenting food. Pt with no overt signs/symptoms of aspiration while feeding. Pt points to bed when given the choice to sit up or get back in bed and transfers in same manner. Exited session with call light in reach and son present in room. Pt missed 15 min of skilled OT therapy 2/2 decreased arousal and participation.  OT Evaluation Precautions/Restrictions  Precautions Precautions: Fall Restrictions Weight Bearing Restrictions: No General Chart Reviewed: Yes Vital Signs Therapy Vitals Temp: 99.6 F (37.6 C) Temp Source: Oral Pulse Rate: 62 Resp: 18 BP: (!) 122/52 Patient Position (if appropriate): Lying Oxygen Therapy SpO2: 97 % O2 Device: Not Delivered Pain   Pt does not open eye to look at faces scale Home Living/Prior Functioning Home Living Available Help at Discharge: Family, Available 24 hours/day Type of Home: House Home Access: Level entry Home Layout: One level Bathroom Shower/Tub: Government social research officer Accessibility: Yes  Lives With: Daughter Prior Function Level of Independence: Other (comment) (supervision per chart review) Driving: No ADL   Vision Patient Visual Report: Other (comment) (pt unable to comment) Vision Assessment?:  (not assessed 2/2 to decreasedd arousal; pt unable to open eye longer than 15 seconds) Perception  Perception:  (unable to assess 2/2 decreased arousal) Praxis Praxis:  (unable to assess 2/2 to decreased arousal) Cognition Overall Cognitive Status:  (difficulty to assess 2/2 to decreased arousal) Arousal/Alertness: Lethargic Orientation Level: Nonverbal/unable to assess (2/2 decreased arousal) Sensation Sensation Light Touch:  (unable to assess 2/2 to decreased arousal) Coordination Gross Motor Movements are Fluid and  Coordinated: No Fine Motor Movements are Fluid and Coordinated: No Motor  Motor Motor: Hemiplegia Mobility     Trunk/Postural Assessment  Cervical Assessment Cervical Assessment: Within Functional Limits Thoracic Assessment Thoracic Assessment: Exceptions to [facility] (rounded shoulders) Lumbar Assessment Lumbar Assessment: Exceptions to [facility] (posterior pelvic tilt) Postural Control Postural Control:  Deficits on evaluation (delayed reactions; difficulty to assess 2/2 to decreased arousal)  Balance Balance Balance Assessed: Yes (difficult to assess 2/2 to decreased arousal; requires MOD-MAX A to sit EOB) Extremity/Trunk Assessment RUE Assessment RUE Assessment: Exceptions to [facility] (generalized weakness, difficult to assess 2/2 decreased arousal) LUE Assessment LUE Assessment: Exceptions to [facility] (generalized weakness; difficult to assess 2/2 decreased arousal)   See Function Navigator for Current Functional Status.   Refer to Care Plan for Long Term Goals  Recommendations for other services: None    Discharge Criteria: Patient will be discharged from OT if patient refuses treatment 3 consecutive times without medical reason, if treatment goals not met, if there is a change in medical status, if patient makes no progress towards goals or if patient is discharged from hospital.  The above assessment, treatment plan, treatment alternatives and goals were discussed and mutually agreed upon: by patient and by family  Tonny Branch 06/07/2017, 8:00 AM

## 2017-06-08 ENCOUNTER — Inpatient Hospital Stay (HOSPITAL_COMMUNITY): Payer: Self-pay | Admitting: Occupational Therapy

## 2017-06-08 DIAGNOSIS — I6340 Cerebral infarction due to embolism of unspecified cerebral artery: Secondary | ICD-10-CM | POA: Diagnosis not present

## 2017-06-08 DIAGNOSIS — I69351 Hemiplegia and hemiparesis following cerebral infarction affecting right dominant side: Secondary | ICD-10-CM | POA: Diagnosis not present

## 2017-06-08 NOTE — Progress Notes (Signed)
Patient ID: Christina [name], female   DOB: 05-20-1946, 71 y.o.   MRN: [id number]   06/08/17.  Christina [name] is a 71 y.o. female  71 year old patient who has a history of PAF, maintained on chronic anticoagulation (Eliquis), status post left MCA infarction March 2018, requiring CIR.  Patient admitted for further CIR following a recent left CVA   Subjective:  Very difficult to arouse this a.m. (2 separate encounters).  Discuss with staff who states that she was also poorly responsive yesterday morning but then became much more alert in the afternoon.  Past Medical History:  Diagnosis Date  . Hypertension   . PAF (paroxysmal atrial fibrillation) (H[facility])   . Stroke [facility](HCC)      Objective: Vital signs in last 24 hours: Temp:  [98.2 F (36.8 C)-98.7 F (37.1 C)] 98.7 F (37.1 C) (06/17 0500) Pulse Rate:  [59] 59 (06/17 0500) Resp:  [16-18] 16 (06/17 0500) BP: (144-145)/(55-60) 145/55 (06/17 0500) SpO2:  [99 %-100 %] 100 % (06/17 0500) Weight change:  Last BM Date: 06/06/17  Intake/Output from previous day: 06/16 0701 - 06/17 0700 In: 320 [P.O.:320] Out: -    BP Readings from Last 3 Encounters:  06/08/17 (!) 145/55  06/06/17 (!) 173/75  06/04/17 (!) 171/83    Physical Exam General: No apparent distress  but difficult to arouse HEENT: not dry; normal pupillary responses Lungs: Normal effort. Lungs clear to auscultation, no crackles or wheezes. Cardiovascular: Regular rate and rhythm, no edema Abdomen: S/NT/ND; BS(+) Musculoskeletal:  unchanged Neurological: No new neurological deficits Mental state: Poorly responsive    Lab Results: BMET    Component Value Date/Time   NA 135 06/06/2017 0635   NA 142 05/08/2017 1026   K 3.5 06/06/2017 0635   CL 103 06/06/2017 0635   CO2 23 06/06/2017 0635   GLUCOSE 121 (H) 06/06/2017 0635   BUN 26 (H) 06/06/2017 0635   BUN 20 05/08/2017 1026   CREATININE 1.95 (H) 06/06/2017 0635   CALCIUM 9.5 06/06/2017 0635   GFRNONAA 25 (L)  06/06/2017 0635   GFRAA 29 (L) 06/06/2017 0635   CBC    Component Value Date/Time   WBC 5.8 06/05/2017 0407   RBC 4.53 06/05/2017 0407   HGB 12.1 06/05/2017 0407   HGB 10.5 (L) 04/08/2017 1435   HCT 37.1 06/05/2017 0407   HCT 32.8 (L) 04/08/2017 1435   PLT 229 06/05/2017 0407   PLT 235 04/08/2017 1435   MCV 81.9 06/05/2017 0407   MCV 86 04/08/2017 1435   MCH 26.7 06/05/2017 0407   MCHC 32.6 06/05/2017 0407   RDW 12.9 06/05/2017 0407   RDW 14.0 04/08/2017 1435   LYMPHSABS 1.0 06/04/2017 1136   LYMPHSABS 1.3 04/08/2017 1435   MONOABS 0.3 06/04/2017 1136   EOSABS 0.2 06/04/2017 1136   EOSABS 0.3 04/08/2017 1435   BASOSABS 0.0 06/04/2017 1136   BASOSABS 0.0 04/08/2017 1435    Studies/Results: No results found.  Medications: I have reviewed the patient's current medications.  Assessment/Plan:  Functional deficits secondary to recurrent left CVA Paroxysmal atrial fibrillation.  Continue anticoagulation History depression    Length of stay, days: 2  Rogelia BogaKWIATKOWSKI,Chole Driver FRANK , MD 06/08/2017, 10:10 AM

## 2017-06-08 NOTE — Progress Notes (Signed)
Occupational Therapy Session Note  Patient Details  Name: Christina [name] MRN: [id number] Date of Birth: 09-23-1946  Today's Date: 06/08/2017 OT Individual Time: 1255-1330 OT Individual Time Calculation (min): 35 min    Short Term Goals: Week 1:  OT Short Term Goal 1 (Week 1): Pt will maintain sitting balance with MIN A in prep for toileting OT Short Term Goal 2 (Week 1): Pt will squat pivot transfer with MAX A to w/c in prep for BSC transfer OT Short Term Goal 3 (Week 1): Pt will don pullover shirt with MAX A OT Short Term Goal 4 (Week 1): Pt will maintain arousal for 5 min  OT Short Term Goal 5 (Week 1): Pt will locate 4/4 grooming items on sink with MIN VC  Skilled Therapeutic Interventions/Progress Updates:    Pt seen for OT session focusing on increased arousal and setlf feeding. Pt sitting up in w/c upon arrival with daughter present. Pt with eyes closed throughout session. Used cold wash cloth and sternal rubs to facilitate arousal, however, pt remained with eyes closed. When presented with spoon of food at mouth pt voluntarily opened mouth and swallowed, no s/s of aspiration. Attempted hand over hand assist at self feeding, however, pt actively pushing against and extending arm in opposite direction of mouth, however, when presented with food total A pt ate with no resistance. Nectar thick beverage given total A via spoon per SLP orders. Pt's daughter requested return to supine at end of session +2 total A squat pivot transfer back to EOB and for return to supine. Pt left in supine with RN and NT present.  Pt's daughter concerned regarding pt's decreased arousal, OT spoke with RN regarding possible medication relation, RN in contact with MD on call for orders.  Therapy Documentation Precautions:  Precautions Precautions: Fall Restrictions Weight Bearing Restrictions: No Praxis Praxis: Impaired Praxis Impairment Details: Ideation;Initiation;Ideomotor;Perseveration  See Function  Navigator for Current Functional Status.   Therapy/Group: Individual Therapy  Lewis, Madasyn Heath C 06/08/2017, 6:49 AM

## 2017-06-09 ENCOUNTER — Inpatient Hospital Stay (HOSPITAL_COMMUNITY): Payer: Self-pay | Admitting: Occupational Therapy

## 2017-06-09 ENCOUNTER — Inpatient Hospital Stay (HOSPITAL_COMMUNITY): Payer: Self-pay | Admitting: Physical Therapy

## 2017-06-09 DIAGNOSIS — I69351 Hemiplegia and hemiparesis following cerebral infarction affecting right dominant side: Secondary | ICD-10-CM | POA: Diagnosis not present

## 2017-06-09 DIAGNOSIS — I639 Cerebral infarction, unspecified: Secondary | ICD-10-CM | POA: Diagnosis not present

## 2017-06-09 DIAGNOSIS — I6340 Cerebral infarction due to embolism of unspecified cerebral artery: Secondary | ICD-10-CM | POA: Diagnosis not present

## 2017-06-09 LAB — COMPREHENSIVE METABOLIC PANEL
ALT: 13 U/L — AB (ref 14–54)
AST: 21 U/L (ref 15–41)
Albumin: 3.2 g/dL — ABNORMAL LOW (ref 3.5–5.0)
Alkaline Phosphatase: 53 U/L (ref 38–126)
Anion gap: 7 (ref 5–15)
BUN: 32 mg/dL — AB (ref 6–20)
CHLORIDE: 104 mmol/L (ref 101–111)
CO2: 26 mmol/L (ref 22–32)
CREATININE: 1.51 mg/dL — AB (ref 0.44–1.00)
Calcium: 9.2 mg/dL (ref 8.9–10.3)
GFR calc Af Amer: 39 mL/min — ABNORMAL LOW (ref >60)
GFR calc non Af Amer: 34 mL/min — ABNORMAL LOW (ref >60)
Glucose, Bld: 106 mg/dL — ABNORMAL HIGH (ref 65–99)
Potassium: 4.1 mmol/L (ref 3.5–5.1)
SODIUM: 137 mmol/L (ref 135–145)
Total Bilirubin: 0.4 mg/dL (ref 0.3–1.2)
Total Protein: 6.1 g/dL — ABNORMAL LOW (ref 6.5–8.1)

## 2017-06-09 LAB — URINALYSIS, ROUTINE W REFLEX MICROSCOPIC
BACTERIA UA: NONE SEEN
Bilirubin Urine: NEGATIVE
Glucose, UA: NEGATIVE mg/dL
Hgb urine dipstick: NEGATIVE
Ketones, ur: NEGATIVE mg/dL
Leukocytes, UA: NEGATIVE
Nitrite: NEGATIVE
Protein, ur: 30 mg/dL — AB
Specific Gravity, Urine: 1.018 (ref 1.005–1.030)
pH: 6 (ref 5.0–8.0)

## 2017-06-09 LAB — CBC WITH DIFFERENTIAL/PLATELET
BASOS ABS: 0 10*3/uL (ref 0.0–0.1)
Basophils Relative: 0 %
Eosinophils Absolute: 0.3 10*3/uL (ref 0.0–0.7)
Eosinophils Relative: 7 %
HCT: 32.9 % — ABNORMAL LOW (ref 36.0–46.0)
HEMOGLOBIN: 10.6 g/dL — AB (ref 12.0–15.0)
LYMPHS PCT: 24 %
Lymphs Abs: 1.3 10*3/uL (ref 0.7–4.0)
MCH: 27.3 pg (ref 26.0–34.0)
MCHC: 32.2 g/dL (ref 30.0–36.0)
MCV: 84.8 fL (ref 78.0–100.0)
Monocytes Absolute: 0.4 10*3/uL (ref 0.1–1.0)
Monocytes Relative: 7 %
NEUTROS PCT: 62 %
Neutro Abs: 3.2 10*3/uL (ref 1.7–7.7)
Platelets: 197 10*3/uL (ref 150–400)
RBC: 3.88 MIL/uL (ref 3.87–5.11)
RDW: 13.4 % (ref 11.5–15.5)
WBC: 5.2 10*3/uL (ref 4.0–10.5)

## 2017-06-09 MED ORDER — VERAPAMIL HCL 40 MG PO TABS
40.0000 mg | ORAL_TABLET | Freq: Three times a day (TID) | ORAL | Status: DC
  Administered 2017-06-09 – 2017-06-10 (×6): 40 mg via ORAL
  Filled 2017-06-09 (×7): qty 1

## 2017-06-09 MED ORDER — MIRTAZAPINE 15 MG PO TABS: 15.0000 mg | ORAL_TABLET | Freq: Every day | ORAL | Status: DC

## 2017-06-09 NOTE — Progress Notes (Signed)
Physical Therapy Session Note  Patient Details  Name: Christina [name] MRN: [id number] Date of Birth: 23-Jul-1946  Today's Date: 06/09/2017 PT Individual Time: 1000-1100 PT Individual Time Calculation (min): 60 min   Short Term Goals: Week 1:  PT Short Term Goal 1 (Week 1): Pt will perform bed mobility with mod assist  PT Short Term Goal 2 (Week 1): Pt will maintain sitting balance with mod assist  PT Short Term Goal 3 (Week 1): Pt will ambulate 39f with mod assist  PT Short Term Goal 4 (Week 1): Pt will maintain standing balance x 3 minutes with mod assist  PT Short Term Goal 5 (Week 1): Pt will propell wc 768fwith min assist from PT  Skilled Therapeutic Interventions/Progress Updates:    pt initially with no c/o pain, however end of session nods "yes" when asked about pain and indicates LLQ of abdomen.  Per pt's son, just received medication prior to therapy session.  Session focus on arousal and initiation in functional context.  Pt keeps eyes closed for majority of session, does not respond to any auditory or tactile stimulation throughout session.  Once returned to bed at end of session, pt does begin a perseverative humming noise.  Attempted to engage pt in simple initiation task focus on opening eyes and reaching for target but with no success.  Standing frame x5 minutes with pt able to maintain upright posture and relative midline orientation but she continues to keep eyes closed throughout.  Pt noted to be incontinent of urine when standing, returned to bed with STEDY +2 assist.  Pt requires +2 for clothing management and hygiene.  Positioned in L side lying at end of session to facilitate visual scanning to midline and neck positioning.  Call bell in reach and needs met.   Therapy Documentation Precautions:  Precautions Precautions: Fall Restrictions Weight Bearing Restrictions: Yes   See Function Navigator for Current Functional Status.   Therapy/Group: Individual  Therapy  CaEarnest Conroyenven-Crew 06/09/2017, 12:05 PM

## 2017-06-09 NOTE — Plan of Care (Signed)
Problem: RH SKIN INTEGRITY Goal: RH STG SKIN FREE OF INFECTION/BREAKDOWN Outcome: Progressing With mod. assist  Problem: RH SAFETY Goal: RH OTHER STG SAFETY GOALS W/ASSIST Other STG Safety Goals With Assistance.  Outcome: Progressing With total  Problem: RH PAIN MANAGEMENT Goal: RH STG PAIN MANAGED AT OR BELOW PT'S PAIN GOAL Outcome: Progressing Less than 3,on 1 to 10 scale

## 2017-06-09 NOTE — IPOC Note (Addendum)
Overall Plan of Care [facility]) Patient Details Name: Christina [name] MRN: [id number] DOB: September 25, 1946  Admitting Diagnosis: L CVA  Hospital Problems: Active Problems:   Cerebellar infarct [facility])   Cerebral infarction due to embolism of cerebral artery (HCC)     Functional Problem List: Nursing    PT Balance, Behavior, Endurance, Motor, Nutrition, Pain, Perception, Safety, Sensory, Skin Integrity, Other (comment)  OT Balance, Behavior, Cognition, Endurance, Motor, Perception, Safety, Sensory, Vision  SLP Behavior, Linguistic, Cognition, Nutrition  TR         Basic ADL's: OT Grooming, Eating, Bathing, Dressing, Toileting     Advanced  ADL's: OT       Transfers: PT Bed Mobility, Bed to Chair, Car, Furniture, Civil Service fast streamer, Research scientist (life sciences): PT Ambulation, Psychologist, prison and probation services, Stairs     Additional Impairments: OT    SLP Swallowing, Communication, Social Cognition comprehension, expression Social Interaction, Problem Solving, Memory, Attention, Awareness  TR      Anticipated Outcomes Item Anticipated Outcome  Self Feeding S  Swallowing  Min A with least restrictive diet   Basic self-care  MIN A  Toileting  MIN A   Bathroom Transfers MIN A  Bowel/Bladder     Transfers  Min assist with LRAD   Locomotion  Ambulatory at household level with LRAd   Communication  Mod A  Cognition  Mod A  Pain     Safety/Judgment      Therapy Plan: PT Intensity: Minimum of 1-2 x/day ,45 to 90 minutes PT Frequency: 5 out of 7 days PT Duration Estimated Length of Stay: 21-25 days  OT Intensity: Minimum of 1-2 x/day, 45 to 90 minutes OT Frequency: 5 out of 7 days OT Duration/Estimated Length of Stay: 21-24 days SLP Intensity: Minumum of 1-2 x/day, 30 to 90 minutes SLP Frequency: 3 to 5 out of 7 days SLP Duration/Estimated Length of Stay: 3 weeks       Team Interventions: Nursing Interventions    PT interventions Ambulation/gait training, Community reintegration,  Fish farm manager, Neuromuscular re-education, Museum/gallery curator, Psychosocial support, UE/LE Strength taining/ROM, Wheelchair propulsion/positioning, UE/LE Coordination activities, Therapeutic Activities, Skin care/wound management, Pain management, Functional electrical stimulation, Discharge planning, Warden/ranger, Cognitive remediation/compensation, Disease management/prevention, Functional mobility training, Patient/family education, Splinting/orthotics, Therapeutic Exercise, Visual/perceptual remediation/compensation  OT Interventions Balance/vestibular training, Cognitive remediation/compensation, Discharge planning, DME/adaptive equipment instruction, Functional electrical stimulation, Functional mobility training, Neuromuscular re-education, Patient/family education, Self Care/advanced ADL retraining, Therapeutic Activities, Therapeutic Exercise, UE/LE Strength taining/ROM, UE/LE Coordination activities, Visual/perceptual remediation/compensation  SLP Interventions Cognitive remediation/compensation, Cueing hierarchy, Dysphagia/aspiration precaution training, Environmental controls, Functional tasks, Internal/external aids, Medication managment, Multimodal communication approach, Patient/family education, Speech/Language facilitation, Therapeutic Activities  TR Interventions    SW/CM Interventions  Psychosocial Assessment, Pt & Family Education & Discharge Planning    Team Discharge Planning: Destination: PT-Home ,OT- Home , SLP-Home Projected Follow-up: PT-Home health PT, OT-  Home health OT, SLP-Home Health SLP, Skilled Nursing facility, 24 hour supervision/assistance Projected Equipment Needs: PT-To be determined, OT- Tub/shower bench, To be determined, SLP-  Equipment Details: PT- , OT-  Patient/family involved in discharge planning: PT- Patient,  OT-Patient, Family member/caregiver, SLP-Family member/caregiver  MD ELOS: 14-18d Medical Rehab Prognosis:   Good Assessment:  71 y.o.right hand femalewith history of hypertension, PAF maintained on Eliquis, depression with multiple follow-ups at behavioral health, 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 Present 06/04/2017 withmental  status/non-as well as recent fall without loss of consciousness. CT/MRI showed multifocal acute ischemia within the largest area involving the right pericallosal white matter and left frontal operculum   Now requiring 24/7 Rehab RN,MD, as well as CIR level PT, OT and SLP.  Treatment team will focus on ADLs and mobility [facility] See Team Conference Notes for weekly updates to the plan of care

## 2017-06-09 NOTE — Progress Notes (Signed)
Occupational Therapy Session Note  Patient Details  Name: Christina [name] MRN: [id number] Date of Birth: 1946/03/20  Today's Date: 06/09/2017  Session 1 OT Individual Time: [id number] OT Individual Time Calculation (min): 55 min   Session 2 OT Individual Time: 1400-1433 OT Individual Time Calculation (min): 33 min    Short Term Goals: Week 1:  OT Short Term Goal 1 (Week 1): Pt will maintain sitting balance with MIN A in prep for toileting OT Short Term Goal 2 (Week 1): Pt will squat pivot transfer with MAX A to w/c in prep for BSC transfer OT Short Term Goal 3 (Week 1): Pt will don pullover shirt with MAX A OT Short Term Goal 4 (Week 1): Pt will maintain arousal for 5 min  OT Short Term Goal 5 (Week 1): Pt will locate 4/4 grooming items on sink with MIN VC  Skilled Therapeutic Interventions/Progress Updates:  Session 1   OT treatment session focused on attention, and initiation within ADL tasks. Pt somnolent upon OT arrival with son present. Per son, pt ate a lot of her breakfast this am, but he fed her every bite. Pt incontinent of bladder requiring total A for rolling R and L to change brief and complete peri-care. Total A to advance LE's off of bed and Total A to elevate trunk. Max A to maintain sitting balance, but pt opened her eyes for the first time this session. Hand-over hand A to help pt find w/c on R side, but unable to maintain grasp on wc to assist with transfer. Total A squat-pivot to wc on R side with pt's son holding wc in placed. Pt brought to the sink and closed her eyes again with head turned to L and making humming sounds. Gently brought pt's head to midline and attempted to engage her in grooming task. Pt would not open eyes, but when handed hairbrush, reached to brush R side of head slightly. Unable to maintain attention to task. Total A to initiate UB dressing task using backwards chaining and hand-over hand A. Stedy used for Sit<>stand with Max +2 to. Completed sit<>stand  from stedy height 3 more x. Pt provided with stress balls 2/2 L hand tone. Pt left seated in wc with son present and safety belt on.   Session 2 Pt's son would really like for pt to have a shower today. Explained safety concerns with showering 2/2 somnolnce today, however pt much more alert this afternoon. Pt perseverative on 2-3 words in spanish, repeating them throughout session. Pt transferred to EOB with total A. Pt able to maintain sitting balance with mod A. Stedy used for transfer to roll in shower chair with max/total A +2. Pt incontinent of bowel and bladder once seated on shower chair. Bathing completed with total A. Engaged pt in bathing task using hand-over hand assist to integrate L UE and provide gently PROM during bathing tasks to break flexor synergy patterns. Pt transferred to EOB s/p shower with Total A +2. Pt hand-off to next OT for continued treatment session.   Therapy Documentation Precautions:  Precautions Precautions: Fall Restrictions Weight Bearing Restrictions: Yes Pain: Pain Assessment Pain Assessment: Faces Faces Pain Scale: Hurts little more Pain Type: Acute pain Pain Location: Leg Pain Orientation: Left Pain Onset: With Activity Pain Intervention(s): Repositioned  See Function Navigator for Current Functional Status.   Therapy/Group: Individual Therapy  Mal Amabilelisabeth S Iktan Aikman 06/09/2017, 9:42 PM

## 2017-06-09 NOTE — Progress Notes (Signed)
Subjective/Complaints: Somnolent this am- briefly responds to verbal and physical stim ROS- cannot obtain due to MS Objective: Vital Signs: Blood pressure (!) 162/61, pulse 65, temperature 99.1 F (37.3 C), temperature source Axillary, resp. rate 18, height _0  (1.6 m), weight 61.2 kg (134 lb 14.7 oz), SpO2 98 %. No results found. Results for orders placed or performed during the hospital encounter of 06/04/17 (from the past 72 hour(s))  Basic metabolic panel     Status: Abnormal   Collection Time: 06/06/17  6:35 AM  Result Value Ref Range   Sodium 135 135 - 145 mmol/L   Potassium 3.5 3.5 - 5.1 mmol/L   Chloride 103 101 - 111 mmol/L   CO2 23 22 - 32 mmol/L   Glucose, Bld 121 (H) 65 - 99 mg/dL   BUN 26 (H) 6 - 20 mg/dL   Creatinine, Ser 1.95 (H) 0.44 - 1.00 mg/dL   Calcium 9.5 8.9 - 10.3 mg/dL   GFR calc non Af Amer 25 (L) >60 mL/min   GFR calc Af Amer 29 (L) >60 mL/min    Comment: (NOTE) The eGFR has been calculated using the CKD EPI equation. This calculation has not been validated in all clinical situations. eGFR's persistently <60 mL/min signify possible Chronic Kidney Disease.    Anion gap 9 5 - 15  Magnesium     Status: None   Collection Time: 06/06/17  6:35 AM  Result Value Ref Range   Magnesium 2.2 1.7 - 2.4 mg/dL     HEENT: normal Cardio: RRR and no murmur Resp: CTA B/L and unlabored GI: BS positive and NT, ND Extremity:  No Edema Skin:   Intact Neuro: Lethargic, will squuze with BUE but poor release, clonus at bilateral ankles, absent Left patellar reflex, normal right patellar Musc/Skel:  Other no pain with UE or LE PROM, no jt effusions noted Gen NAD   Assessment/Plan: 1. 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 Physiatrist is providing close team supervision and 24 hour management of active medical problems listed  below. Physiatrist and rehab team continue to assess barriers to discharge/monitor patient progress toward functional and medical goals. FIM:       Function - Toileting Toileting activity did not occur: No continent bowel/bladder event  Function - Air cabin crew transfer activity did not occur: Safety/medical concerns  Function - Chair/bed transfer Chair/bed transfer method: Squat pivot Chair/bed transfer assist level: 2 helpers Chair/bed transfer details: Tactile cues for initiation  Function - Locomotion: Wheelchair Type: Manual Max wheelchair distance: 52f  Assist Level: Moderate assistance (Pt 50 - 74%) Wheel 50 feet with 2 turns activity did not occur: Refused Wheel 150 feet activity did not occur: Safety/medical concerns Function - Locomotion: Ambulation Assistive device: Parallel bars Max distance: 2 Assist level: Maximal assist (Pt 25 - 49%) Walk 10 feet activity did not occur: Safety/medical concerns Walk 50 feet with 2 turns activity did not occur: Safety/medical concerns Walk 150 feet activity did not occur: Safety/medical concerns Walk 10 feet on uneven surfaces activity did not occur: Safety/medical concerns  Function - Comprehension Comprehension: Auditory Comprehension assist level: Understands basic less than 25% of the time/ requires cueing >75% of the time  Function - Expression Expression: Verbal Expression assist level: Expresses basis less than 25% of the time/requires cueing >75% of the time.  Function - Social Interaction Social Interaction assist level: Interacts appropriately less than  25% of the time. May be withdrawn or combative.  Function - Problem Solving Problem solving assist level: Solves basic less than 25% of the time - needs direction nearly all the time or does not effectively solve problems and may need a restraint for safety  Function - Memory Memory assist level: Recognizes or recalls less than 25% of the time/requires  cueing greater than 75% of the time Patient normally able to recall (first 3 days only): None of the above  Medical Problem List and Plan: 1. 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 DVT Prophylaxis/Anticoagulation: Eliquis 3. Pain Management: Tylenol 4. Mood: Prozac 40 mg daily, Remeron 15 mg daily at bedtime-which will be d/ced due to somnolence -team to provide egosupport as cognitively/linguistically appropriate -family supportive as well 5. Neuropsych: This patient is capable of making decisions on herown behalf. 6. Skin/Wound Care: Routine skin checks 7. Fluids/Electrolytes/Nutrition: RoutineI&O's with follow-up chemistries upon admit 8.PAF.Cardiac rate control. Continue Eliquis 9.Hypertension.Coreg 25 mg BID,Cozaar 100 mg daily,verapamil 180 mg twice a day 10.Hyperlipidemia. Lipitor 11.  Somnolence- has ACA on MCA infarct may also be side effect of remeron (will d/c) or subclinical UTI, 6/14 UA neg but had UTI 5/31 LOS (Days) 3 A FACE TO FACE EVALUATION WAS PERFORMED  KIRSTEINS,ANDREW E 06/09/2017, 6:27 AM

## 2017-06-09 NOTE — Progress Notes (Signed)
Pharmacy was contacted about patients inability to swallow Verapamil CR tablet. Was told to hold medication until changed by the physician because there is no form that can be crushed. Communicated with the PA, will address during rounds.Will pass on to the oncoming nurse.

## 2017-06-09 NOTE — Care Management Note (Signed)
[facility] Individual Statement of Services  Patient Name:  Christina [name]  Date:  06/09/2017  [facility] the [facility].  Our goal is to provide you with an individualized program based on your diagnosis and situation, designed to meet your specific needs.  With this comprehensive rehabilitation program, you will be expected to participate in at least 3 hours of rehabilitation therapies Monday-Friday, with modified therapy programming on the weekends.  Your rehabilitation program will include the following services:  Physical Therapy (PT), Occupational Therapy (OT), Speech Therapy (ST), 24 hour per day rehabilitation nursing, Therapeutic Recreaction (TR), Neuropsychology, Case Management (Social Worker), Rehabilitation Medicine, Nutrition Services and Pharmacy Services  Weekly team conferences will be held on Wednesday to discuss your progress.  Your Social Worker will talk with you frequently to get your input and to update you on team discussions.  Team conferences with you and your family in attendance may also be held.  Expected length of stay: 21-25 days  Overall anticipated outcome: supervision-min assist level  Depending on your progress and recovery, your program may change. Your Social Worker will coordinate services and will keep you informed of any changes. Your Social Worker's name and contact numbers are listed  below.  The following services may also be recommended but are not provided by the [facility]:    Home Health Rehabiltiation Services  Outpatient Rehabilitation Services    Arrangements will be made to provide these services after discharge if needed.  Arrangements include referral to agencies that provide these services.  Your insurance has been verified to be:  Medicaid & Medicare part B Your primary doctor is:  [facility]  Pertinent information will be shared with your doctor and your insurance  company.  Social Worker:  Dossie DerBecky Lacara [place], [address] [phone] or ([phone]) [phone]  Information discussed with and copy given to patient by: [name], [name] G, 06/09/2017, 1:24 PM

## 2017-06-09 NOTE — Progress Notes (Signed)
Occupational Therapy Session Note  Patient Details  Name: Christina [name] MRN: [id number] Date of Birth: 11/19/1946  Today's Date: 06/09/2017 OT Individual Time: 4098-11911435-1517 OT Individual Time Calculation (min): 42 min    Short Term Goals: Week 1:  OT Short Term Goal 1 (Week 1): Pt will maintain sitting balance with MIN A in prep for toileting OT Short Term Goal 2 (Week 1): Pt will squat pivot transfer with MAX A to w/c in prep for BSC transfer OT Short Term Goal 3 (Week 1): Pt will don pullover shirt with MAX A OT Short Term Goal 4 (Week 1): Pt will maintain arousal for 5 min  OT Short Term Goal 5 (Week 1): Pt will locate 4/4 grooming items on sink with MIN VC    Skilled Therapeutic Interventions/Progress Updates:    Tx focus on alertness, awareness, and attention during self care tasks.   Pt greeted via OT handoff in roll in shower chair. 2 helpers for pt transferring with Stedy to bed to proceed with dressing. At EOB, pt unable to select clothing items from 2 options. Pt with strong left gaze preference, persistent guttural humming, and hypertonicity in bilateral UEs. Pt resisting OT with gentle guidance of UEs 50% of time. Pt clutching Stedy bars with bilateral UEs, required extra time and calming cues to release grips for sit<supine transition. Pt donning clean brief with 2 helpers for rolling R<L. Pt able to maintain L/R grasp on bedrails but did not physically assist with rolling. Pt closing eyes at this point, responding to graded pinch on bilateral LEs with consistent eye opening. No reaction/reflex from either limb observed. Pt using Q tip in Rt ear with initial HOH assist, then doing this herself. Pt resisting gentle facilitation of head turn to Rt side. Son, [name] assisting with cleaning her other ear due to pt resisting HOH guidance. Tried to have her comb hair with HOH assist, pt attending to Rt side of head, but required assist to keep bristles facing hair. Pt then repositioned for  comfort with 2 helpers. Oriented her to time, place and situation. Pt left with son and all needs within reach.  18 minutes missed due to fatigue (pts eyes closing and not attending to tactile/verbal stimulation at this point).   Therapy Documentation Precautions:  Precautions Precautions: Fall Restrictions Weight Bearing Restrictions: Yes General: General OT Amount of Missed Time: 18 Minutes Vital Signs: Therapy Vitals Temp: 97.5 F (36.4 C) Temp Source: Axillary Pulse Rate: 60 Resp: 20 BP: (!) 179/77 Patient Position (if appropriate): Lying Oxygen Therapy SpO2: 98 % O2 Device: Not Delivered Pain:  Pain Assessment Pain Assessment: No/denies pain Pain Score: 0-No pain Faces Pain Scale: No hurt ADL:      See Function Navigator for Current Functional Status.   Therapy/Group: Individual Therapy  Vanderbilt Ranieri A Genae Strine 06/09/2017, 3:34 PM

## 2017-06-10 ENCOUNTER — Inpatient Hospital Stay (HOSPITAL_COMMUNITY): Payer: Medicare Other | Admitting: Speech Pathology

## 2017-06-10 ENCOUNTER — Encounter (HOSPITAL_COMMUNITY): Payer: Self-pay

## 2017-06-10 ENCOUNTER — Encounter (HOSPITAL_COMMUNITY): Payer: Self-pay | Admitting: Psychology

## 2017-06-10 ENCOUNTER — Inpatient Hospital Stay (HOSPITAL_COMMUNITY): Payer: Self-pay | Admitting: Occupational Therapy

## 2017-06-10 ENCOUNTER — Inpatient Hospital Stay (HOSPITAL_COMMUNITY): Payer: Self-pay | Admitting: Physical Therapy

## 2017-06-10 DIAGNOSIS — R4701 Aphasia: Secondary | ICD-10-CM | POA: Diagnosis not present

## 2017-06-10 DIAGNOSIS — I69319 Unspecified symptoms and signs involving cognitive functions following cerebral infarction: Secondary | ICD-10-CM | POA: Diagnosis not present

## 2017-06-10 DIAGNOSIS — R41841 Cognitive communication deficit: Secondary | ICD-10-CM

## 2017-06-10 DIAGNOSIS — I69320 Aphasia following cerebral infarction: Secondary | ICD-10-CM

## 2017-06-10 DIAGNOSIS — I6340 Cerebral infarction due to embolism of unspecified cerebral artery: Secondary | ICD-10-CM | POA: Diagnosis not present

## 2017-06-10 DIAGNOSIS — I69351 Hemiplegia and hemiparesis following cerebral infarction affecting right dominant side: Secondary | ICD-10-CM | POA: Diagnosis not present

## 2017-06-10 DIAGNOSIS — I639 Cerebral infarction, unspecified: Secondary | ICD-10-CM | POA: Diagnosis not present

## 2017-06-10 LAB — URINE CULTURE: CULTURE: NO GROWTH

## 2017-06-10 MED ORDER — TRAZODONE HCL 50 MG PO TABS
50.0000 mg | ORAL_TABLET | Freq: Every evening | ORAL | Status: DC | PRN
  Administered 2017-06-13 – 2017-06-18 (×3): 50 mg via ORAL
  Filled 2017-06-10 (×4): qty 1

## 2017-06-10 MED ORDER — SODIUM CHLORIDE 0.45 % IV SOLN
INTRAVENOUS | Status: DC
  Administered 2017-06-10: 1000 mL via INTRAVENOUS
  Administered 2017-06-12 – 2017-06-17 (×6): via INTRAVENOUS

## 2017-06-10 NOTE — Progress Notes (Signed)
Occupational Therapy Session Note  Patient Details  Name: Christina [name] MRN: [id number] Date of Birth: Feb 07, 1946  Today's Date: 06/10/2017 OT Individual Time: 4098-11910730-0830 OT Individual Time Calculation (min): 60 min  (make-up session)   Short Term Goals: Week 1:  OT Short Term Goal 1 (Week 1): Pt will maintain sitting balance with MIN A in prep for toileting OT Short Term Goal 2 (Week 1): Pt will squat pivot transfer with MAX A to w/c in prep for BSC transfer OT Short Term Goal 3 (Week 1): Pt will don pullover shirt with MAX A OT Short Term Goal 4 (Week 1): Pt will maintain arousal for 5 min  OT Short Term Goal 5 (Week 1): Pt will locate 4/4 grooming items on sink with MIN VC  Skilled Therapeutic Interventions/Progress Updates:    Pt seen for make up time to address ADL re-training, attention, initiation, and attention to R. Pt in supine upon arrival, eyes open and daughter present. Pants donned total A from bed level, requiring total A for rolling with tactile and VCs for intiation to assist with rolling. She transferred to EOB with total A, required mod A sitting balance EOB. Completed max A squat pivot transfer to w/c. UB dressing and grooming tasks completed from w/c level. Pt required significantly increased time and multimodal cuing for intiation of all tasks, however, once initiated would perseverate on movement and unable to attend/ complete entire task (i.e. Washing only chin)  Pt ate breakfast, requiring total A for utensil management and scooping food, pt opening mouth and swallowing with presentation of spoon. Then completed hand over hand assist for pt to bring spoon to mouth, pt allowing this movement today, an improvement from previous sessions. Pt left seated in w/c at end of session, all needs in reach. NT made aware of pt's position as daughter left during session. Pt's daughter was assisting pt with ADL tasks throughout session, pt's daughter providing total A, speaking loudly  and repetitively to pt. Educated regarding proper body mechanics and need to facilitate movements in order to allow pt to participate and for importance of proper body mechanics during mobility. She declined education for ROM exercises, stating she did this during prior CVA, however, was not completing correctly during therapy session. She did have questions regarding pt's increased tone and synergy patterns, education provided. Also encouraged attention to pt's R side when family visiting or for TV to facilitate R side awareness.  Pt non-verbal throughout session, eyes opening intermittently, humming or yelling "aye" throughout when eyes were open.   Therapy Documentation Precautions:  Precautions Precautions: Fall Restrictions Weight Bearing Restrictions: Yes Pain:    Faces: no pain See Function Navigator for Current Functional Status.   Therapy/Group: Individual Therapy  Lewis, Gery Sabedra C 06/10/2017, 7:10 AM
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
## 2017-06-10 NOTE — Progress Notes (Signed)
Speech Language Pathology Daily Session Note  Patient Details  Name: Christina [name] MRN: [id number] Date of Birth: 05-04-1946  Today's Date: 06/10/2017 SLP Individual Time: 1300-1355 SLP Individual Time Calculation (min): 55 min  Short Term Goals: Week 1: SLP Short Term Goal 1 (Week 1): Pt will consume dysphagia 1 diet with necatr thick liquids by spoon without overt s/s of aspiration to demonstrate readiness for diet upgrade. SLP Short Term Goal 2 (Week 1): Pt will consume trials of thin liquids with decreased oral holding and timely swallow initiation to demonstrate readiness for diet uprgrade or instrumental study. SLP Short Term Goal 3 (Week 1): Pt will open eyes and maintain arousal for 10 seconds with Max A cues.  SLP Short Term Goal 4 (Week 1): Pt will name common objects with 50% accuracy and Max A cues.  SLP Short Term Goal 5 (Week 1): Pt will follow 1 step simple directions related to basic ADL tasks in 50% of opportunities with Max A cues.  SLP Short Term Goal 6 (Week 1): Pt will initiate tasks related to basic ADLs in 50% of opportunities with Max A cues.   Skilled Therapeutic Interventions: Skilled treatment session focused on cognitive goals. Upon arrival, patient was awake while sitting upright in the wheelchair and her eyes remained open throughout the session.  SLP facilitated session by providing total A for patient to initiate/follow basic 1-step commands during structured and functional tasks. Patient nonverbal and "hummed" throughout most of session despite Max A multimodal cues. Patient utilized gestures to answer yes/no questions X 2. Patient left upright in wheelchair with all needs within reach. Continue with current plan of care.      Function:   Cognition Comprehension Comprehension assist level: Understands basic less than 25% of the time/ requires cueing >75% of the time  Expression   Expression assist level: Expresses basis less than 25% of the time/requires  cueing >75% of the time.  Social Interaction Social Interaction assist level: Interacts appropriately less than 25% of the time. May be withdrawn or combative.  Problem Solving Problem solving assist level: Solves basic less than 25% of the time - needs direction nearly all the time or does not effectively solve problems and may need a restraint for safety  Memory Memory assist level: Recognizes or recalls less than 25% of the time/requires cueing greater than 75% of the time    Pain Pain Assessment Faces Pain Scale: No hurt  Therapy/Group: Individual Therapy  Lucresia Simic 06/10/2017, 3:06 PM

## 2017-06-10 NOTE — Progress Notes (Signed)
Patient information reviewed and entered into eRehab system by Edson SnowballBecky Windsor, PT and reviewed by Tora DuckMarie Italo Banton, RN, CRRN, PPS Coordinator.  Information including medical coding and functional independence measure will be reviewed and updated through discharge.     Per nursing patient was given "Data Collection Information Summary for Patients in Inpatient Rehabilitation Facilities with attached "Privacy Act Statement-Health Care Records" upon admission.

## 2017-06-10 NOTE — Progress Notes (Signed)
Subjective/Complaints: Daughter at bedside, has been there since 1130p .  Pt slept well, per RN no gross hematuria ROS- cannot obtain due to MS Objective: Vital Signs: Blood pressure (!) 176/71, pulse 65, temperature 98.3 F (36.8 C), temperature source Oral, resp. rate 20, height 5' 3"  (1.6 m), weight 61.2 kg (134 lb 14.7 oz), SpO2 99 %. No results found. Results for orders placed or performed during the hospital encounter of 06/06/17 (from the past 72 hour(s))  CBC WITH DIFFERENTIAL     Status: Abnormal   Collection Time: 06/09/17  7:02 AM  Result Value Ref Range   WBC 5.2 4.0 - 10.5 K/uL   RBC 3.88 3.87 - 5.11 MIL/uL   Hemoglobin 10.6 (L) 12.0 - 15.0 g/dL   HCT 32.9 (L) 36.0 - 46.0 %   MCV 84.8 78.0 - 100.0 fL   MCH 27.3 26.0 - 34.0 pg   MCHC 32.2 30.0 - 36.0 g/dL   RDW 13.4 11.5 - 15.5 %   Platelets 197 150 - 400 K/uL   Neutrophils Relative % 62 %   Neutro Abs 3.2 1.7 - 7.7 K/uL   Lymphocytes Relative 24 %   Lymphs Abs 1.3 0.7 - 4.0 K/uL   Monocytes Relative 7 %   Monocytes Absolute 0.4 0.1 - 1.0 K/uL   Eosinophils Relative 7 %   Eosinophils Absolute 0.3 0.0 - 0.7 K/uL   Basophils Relative 0 %   Basophils Absolute 0.0 0.0 - 0.1 K/uL  Comprehensive metabolic panel     Status: Abnormal   Collection Time: 06/09/17  7:02 AM  Result Value Ref Range   Sodium 137 135 - 145 mmol/L   Potassium 4.1 3.5 - 5.1 mmol/L   Chloride 104 101 - 111 mmol/L   CO2 26 22 - 32 mmol/L   Glucose, Bld 106 (H) 65 - 99 mg/dL   BUN 32 (H) 6 - 20 mg/dL   Creatinine, Ser 1.51 (H) 0.44 - 1.00 mg/dL   Calcium 9.2 8.9 - 10.3 mg/dL   Total Protein 6.1 (L) 6.5 - 8.1 g/dL   Albumin 3.2 (L) 3.5 - 5.0 g/dL   AST 21 15 - 41 U/L   ALT 13 (L) 14 - 54 U/L   Alkaline Phosphatase 53 38 - 126 U/L   Total Bilirubin 0.4 0.3 - 1.2 mg/dL   GFR calc non Af Amer 34 (L) >60 mL/min   GFR calc Af Amer 39 (L) >60 mL/min    Comment: (NOTE) The eGFR has been calculated using the CKD EPI equation. This calculation has  not been validated in all clinical situations. eGFR's persistently <60 mL/min signify possible Chronic Kidney Disease.    Anion gap 7 5 - 15  Urinalysis, Routine w reflex microscopic     Status: Abnormal   Collection Time: 06/09/17  4:30 PM  Result Value Ref Range   Color, Urine YELLOW YELLOW   APPearance CLEAR CLEAR   Specific Gravity, Urine 1.018 1.005 - 1.030   pH 6.0 5.0 - 8.0   Glucose, UA NEGATIVE NEGATIVE mg/dL   Hgb urine dipstick NEGATIVE NEGATIVE   Bilirubin Urine NEGATIVE NEGATIVE   Ketones, ur NEGATIVE NEGATIVE mg/dL   Protein, ur 30 (A) NEGATIVE mg/dL   Nitrite NEGATIVE NEGATIVE   Leukocytes, UA NEGATIVE NEGATIVE   RBC / HPF TOO NUMEROUS TO COUNT 0 - 5 RBC/hpf   WBC, UA 0-5 0 - 5 WBC/hpf   Bacteria, UA NONE SEEN NONE SEEN   Squamous Epithelial / LPF 0-5 (A)  NONE SEEN     HEENT: normal Cardio: RRR and no murmur Resp: CTA B/L and unlabored GI: BS positive and NT, ND Extremity:  No Edema Skin:   Intact Neuro: Lethargic, will squuze with BUE but poor release, clonus at bilateral ankles, absent Left patellar reflex, normal right patellar Musc/Skel:  Other no pain with UE or LE PROM, no jt effusions noted Gen NAD   Assessment/Plan: 1. 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 Physiatrist is providing close team supervision and 24 hour management of active medical problems listed below. Physiatrist and rehab team continue to assess barriers to discharge/monitor patient progress toward functional and medical goals. FIM: Function - Bathing Position: Shower Body parts bathed by helper: Right arm, Chest, Left arm, Abdomen, Front perineal area, Buttocks, Right upper leg, Left upper leg, Right lower leg, Left lower leg, Back Assist Level: 2 helpers  Function- Upper Body Dressing/Undressing What is the patient wearing?: Pull over  shirt/dress (nightgown) Pull over shirt/dress - Perfomed by helper: Thread/unthread right sleeve, Put head through opening, Thread/unthread left sleeve, Pull shirt over trunk Assist Level: 2 helpers Function - Lower Body Dressing/Undressing What is the patient wearing?: Non-skid slipper socks Position: Bed Pants- Performed by helper: Thread/unthread right pants leg, Pull pants up/down, Thread/unthread left pants leg Non-skid slipper socks- Performed by helper: Don/doff right sock, Don/doff left sock Socks - Performed by helper: Don/doff left sock, Don/doff right sock Assist for footwear: Dependant Assist for lower body dressing: 2 Helpers  Function - Toileting Toileting activity did not occur: No continent bowel/bladder event Toileting steps completed by helper: Adjust clothing prior to toileting, Performs perineal hygiene, Adjust clothing after toileting (per Elby Beck, NT report) Assist level: Two helpers  Function - Air cabin crew transfer activity did not occur: Safety/medical concerns Assist level to toilet: 2 helpers (per [employer] report)  Function - Chair/bed transfer Chair/bed transfer method: Other Chair/bed transfer assist level: 2 helpers Chair/bed transfer assistive device: Mechanical lift Mechanical lift: Stedy Chair/bed transfer details: Tactile cues for initiation  Function - Locomotion: Wheelchair Type: Manual Max wheelchair distance: 53f  Assist Level: Moderate assistance (Pt 50 - 74%) Wheel 50 feet with 2 turns activity did not occur: Refused Wheel 150 feet activity did not occur: Safety/medical concerns Function - Locomotion: Ambulation Assistive device: Parallel bars Max distance: 2 Assist level: Maximal assist (Pt 25 - 49%) Walk 10 feet activity did not occur: Safety/medical concerns Walk 50 feet with 2 turns activity did not occur: Safety/medical concerns Walk 150 feet activity did not occur: Safety/medical concerns Walk 10 feet on uneven  surfaces activity did not occur: Safety/medical concerns  Function - Comprehension Comprehension: Auditory Comprehension assist level: Understands basic less than 25% of the time/ requires cueing >75% of the time  Function - Expression Expression: Verbal Expression assist level: Expresses basis less than 25% of the time/requires cueing >75% of the time.  Function - Social Interaction Social Interaction assist level: Interacts appropriately less than 25% of the time. May be withdrawn or combative.  Function - Problem Solving Problem solving assist level: Solves basic less than 25% of the time - needs direction nearly all the time or does not effectively solve problems and may need a restraint for safety  Function - Memory Memory assist level: Recognizes or recalls less than 25% of the time/requires cueing greater than 75% of the time Patient normally able to recall (first 3  days only): None of the above  Medical Problem List and Plan: 1. 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 DVT Prophylaxis/Anticoagulation: Eliquis 3. Pain Management: Tylenol 4. Mood: Prozac 40 mg daily, Remeron 15 mg daily at bedtime- both of which which will be d/ced due to somnolence -team to provide egosupport as cognitively/linguistically appropriate -family supportive as well 5. Neuropsych: This patient is capable of making decisions on herown behalf. 6. Skin/Wound Care: Routine skin checks 7. Fluids/Electrolytes/Nutrition: RoutineI&O's , increased BUN and Creat, IVF at noc 8.PAF.Cardiac rate control. Continue Eliquis- monitor Hgb 9.Hypertension.Coreg 25 mg BID,Cozaar 100 mg daily,verapamil 180 mg twice a day 10.Hyperlipidemia. Lipitor 11.  Somnolence- has ACA on MCA infarct may also be side effect of remeron now d/ced 6/18 UA  neg , daughter states that pt did not get prozac at home due to somnolence LOS (Days) [place]le E 06/10/2017, 7:25 AM

## 2017-06-10 NOTE — Progress Notes (Signed)
Physical Therapy Session Note  Patient Details  Name: Christina [name] MRN: [id number] Date of Birth: 1946/06/10  Today's Date: 06/10/2017 PT Individual Time: 0900-1015 PT Individual Time Calculation (min): 75 min   Short Term Goals: Week 1:  PT Short Term Goal 1 (Week 1): Pt will perform bed mobility with mod assist  PT Short Term Goal 2 (Week 1): Pt will maintain sitting balance with mod assist  PT Short Term Goal 3 (Week 1): Pt will ambulate 3120ft with mod assist  PT Short Term Goal 4 (Week 1): Pt will maintain standing balance x 3 minutes with mod assist  PT Short Term Goal 5 (Week 1): Pt will propell wc 3075ft with min assist from PT  Skilled Therapeutic Interventions/Progress Updates:    no c/o pain.  Pt in gym already on PT arrival.  Session focus on attention, initiation, and visual scanning with functional tasks/mobility.    Pt transfers w/c to therapy mat on R with stand/pivot and max assist for lift/lower and to facilitate weight shift for pivot.  Static sitting balance during cup stacking activity with hand over hand assist to reach for cups and stack on a stool in front of her.  Pt requires total multimodal cues to maintain open eyes and scan to midline to locate cups.  Pt able to initiate to reach for 2 cups out of 8.  Transition pt to R side lying with total assist to facilitate gravity assisted R neck rotation, attempted to engage pt in reaching task but pt does not initiate even with hand over hand assist, or following 5 minute rest break.  Pt returned to sitting total assist, continues to not initiate any functional movement, so returned to w/c with total assist stand/pivot to L.  Upon returning to room, pt perseverating on grabbing her pants, nods yes when asked if she needs to use bathroom.  STEDY +2 for BSC transfer, pt incontinent of urine during clothing management.  PT performs 3/3 toileting steps and pt returned to w/c, appears to be distressed with perseveration on gutteral  humming and saying "eye, eye, eye, eye" but unable to indicate whether something was wrong.  Once positioned in w/c pt appears to be comfortable, QRB donned, call bell in lap.    Therapy Documentation Precautions:  Precautions Precautions: Fall Restrictions Weight Bearing Restrictions: Yes   See Function Navigator for Current Functional Status.   Therapy/Group: Individual Therapy  Ladora DanielCaitlin E Penven-Crew 06/10/2017, 9:51 AM

## 2017-06-10 NOTE — Progress Notes (Signed)
Occupational Therapy Session Note  Patient Details  Name: Christina [name] MRN: [id number] Date of Birth: April 13, 1946  Today's Date: 06/10/2017 OT Individual Time: 3868-5488 OT Individual Time Calculation (min): 58 min   Short Term Goals: Week 1:  OT Short Term Goal 1 (Week 1): Pt will maintain sitting balance with MIN A in prep for toileting OT Short Term Goal 2 (Week 1): Pt will squat pivot transfer with MAX A to w/c in prep for BSC transfer OT Short Term Goal 3 (Week 1): Pt will don pullover shirt with MAX A OT Short Term Goal 4 (Week 1): Pt will maintain arousal for 5 min  OT Short Term Goal 5 (Week 1): Pt will locate 4/4 grooming items on sink with MIN VC  Skilled Therapeutic Interventions/Progress Updates:   L gaze preference, utilized mirror feedback to encourage pt to look to midline. Pt able to maintain midline gaze for ~ 30 seconds. Hand over hand A to turn on water and reach B UE's to place under faucet.  Placed items on L side of sink and provided max multimodal cues in quiet environment, but pt unable to look past midline to R. Handed pt brush, with over grasping on brush. Pt with ideational apraxia attempting to brush leg. Provided hand-over hand A to bring brush to head. Pt then achieved small brush stroke from wrist to assist with hair brushing task. Pt continues to have perseverative humming sound throughout session.  [employer] used with hand-over hand A to cross midline and encourage R side attention and PNF movement patterns. With repetition, initiated finger isolation, but still requiring hand-over hadn to reach forward and press button at midline. Improved ability to track lights on L side, but unable to scan past midline. Pt returned to room and left seated in wc with needs met.   Therapy Documentation Precautions:  Precautions Precautions: Fall Restrictions Weight Bearing Restrictions: Yes Pain: Pain Assessment Faces Pain Scale: No hurt  See Function Navigator for  Current Functional Status.  Therapy/Group: Individual Therapy  Valma Cava 06/10/2017, 12:07 PM

## 2017-06-10 NOTE — Consult Note (Signed)
Neuropsychological Consultation   Patient:   Christina [name]   DOB:   1946/04/02  MR Number:  [id number]  Location:  [facility] [address] [facility] [id number] [place] [address] Dept: [phone] Loc: [phone]           Date of Service:   06/10/2017  Start Time:   2 PM End Time:   2:45 PM  Provider/Observer:  Arley Phenix, Psy.D.       Clinical Neuropsychologist       Billing Code/Service: [phone] 4 Units  Chief Complaint:    The patient has had a significant Christina frontal lobe stroke.  Question about how much of her lack of communication (verbal) was behavioral or neurological.  Reason for Service:  Christina [name] a 71 year old female that was in the inpatient rehabilitation services between the end of march and first two weeks of April.  She was doing much better at discharge and walking with rolling [name].  06/04/2017 sudden change in mental status and fall.  MRI:  Multifocal acute ischemia in both the right pericallosal white matter and Christina frontal lobe.  No hemorrhage.  Below is the HPI from the current admission.  Christina [name], Christina [name], Christina [name], Christina [name] and supervision. Present 06/04/2017 withmental status/non-as well as recent fall without loss of consciousness. CT/MRI showed multifocal acute ischemia within the largest area involving the right pericallosal white matter and Christina frontal operculum. No hemorrhage or mass effect. Old right frontal infarct and multiple small old cerebellar infarcts. Echocardiogramwith ejection fraction of 65% grade 1 diastolic dysfunction. Patient did not receive  TPA. Patient currently remains on [name]. Tolerating a regular diet. Physical therapy evaluation completed with recommendations of physical medicine rehabilitation consult.Patient was admitted for a comprehensive rehabilitation program  Current Status:  The patient is displaying almost no direct communication verbally or physically.  No purposeful eye contact.  She did "hum" with alternating pitch and the cadence and pitch changes when asking the patient about issues addressed at last admission.  She did not make eye contact for entire time of visit.  Her changes in humming did appear to indicate that she has some awareness of questions being asked but she did not display even an attempt to verbally respond.    Reliability of Information: Information is from direct face to face clinical interview, review of medical records and discussion with treatment team members.  Behavioral Observation: Christina [name]  presents as a 71 y.o.-year-old Right Hispanic Female who appeared her stated age. her dress was Appropriate and she was Well Groomed and her manners were Appropriate, inappropriate to the situation.  her participation was indicative of no verbal communication and almost no behavioral responses indicating understanding. behaviors.  There were  physical disabilities noted.  she displayed an inappropriate level of cooperation and motivation.     Interactions:    Minimal Inattentive  Attention:   abnormal and attention span appeared shorter than expected for age  Memory:   abnormal; global memory impairment noted  Visuo-spatial:  not examined  Speech (Volume):  low  Speech:   non-fluent aphasia; non-fluent aphasia  Thought Process:  no verbal responses  Though Content:  No responsive communication and no spontaneous responses.  Orientation:   No or little indication of orientation to place or  time.  Judgment:   Poor  Planning:   Poor  Affect:    Lethargic  Mood:    Depressed  Insight:   Lacking  Intelligence:   normal   Medical History:   Past Medical History:  Diagnosis Date  . [name]   . Christina (paroxysmal atrial fibrillation) (HCC)   . Stroke [facility](HCC)            Abuse/Trauma History: No indication of abuse but the patient did talk of so issues of traumatic experiences during therapeutic interventions during last admission.  Psychiatric History:  There are indications and information about premorbid psychiatric issues.  Family Med/Psych History:  Family History  Problem Relation Age of Onset  . Heart disease Mother   . Diabetes Mother   . Heart disease Father   . Heart disease Sister   . Heart disease Sister     Risk of Suicide/Violence: virtually non-existent The patient does not appear to have any self directed actions.  Impression/DX:  Christina [name] a 71 year old female that was in the inpatient rehabilitation services between the end of march and first two weeks of April.  She was doing much better at discharge and walking with rolling [name].  06/04/2017 sudden change in mental status and fall.  The lack of any attemps at verbal interactions or communication do not appear to be behavioral in nature.  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  The patient does appear to have some understanding of what is said to her but little response back.   Diagnosis:    Christina frontal lobe stroke        Electronically Signed   _______________________ Arley PhenixJohn Teffany Blaszczyk, Psy.D.

## 2017-06-11 ENCOUNTER — Inpatient Hospital Stay (HOSPITAL_COMMUNITY): Payer: Self-pay | Admitting: Occupational Therapy

## 2017-06-11 ENCOUNTER — Inpatient Hospital Stay (HOSPITAL_COMMUNITY): Payer: Self-pay | Admitting: Physical Therapy

## 2017-06-11 ENCOUNTER — Inpatient Hospital Stay (HOSPITAL_COMMUNITY): Payer: Medicare Other | Admitting: Speech Pathology

## 2017-06-11 DIAGNOSIS — R41841 Cognitive communication deficit: Secondary | ICD-10-CM | POA: Diagnosis not present

## 2017-06-11 DIAGNOSIS — R4701 Aphasia: Secondary | ICD-10-CM | POA: Diagnosis not present

## 2017-06-11 DIAGNOSIS — I69351 Hemiplegia and hemiparesis following cerebral infarction affecting right dominant side: Secondary | ICD-10-CM | POA: Diagnosis not present

## 2017-06-11 DIAGNOSIS — I6340 Cerebral infarction due to embolism of unspecified cerebral artery: Secondary | ICD-10-CM | POA: Diagnosis not present

## 2017-06-11 DIAGNOSIS — I639 Cerebral infarction, unspecified: Secondary | ICD-10-CM | POA: Diagnosis not present

## 2017-06-11 MED ORDER — VERAPAMIL HCL 80 MG PO TABS
80.0000 mg | ORAL_TABLET | Freq: Three times a day (TID) | ORAL | Status: DC
  Administered 2017-06-11 – 2017-06-12 (×2): 80 mg via ORAL
  Filled 2017-06-11 (×2): qty 1

## 2017-06-11 MED ORDER — METHYLPHENIDATE HCL 5 MG PO TABS
5.0000 mg | ORAL_TABLET | Freq: Two times a day (BID) | ORAL | Status: DC
  Administered 2017-06-11 – 2017-06-14 (×7): 5 mg via ORAL
  Filled 2017-06-11 (×7): qty 1

## 2017-06-11 MED ORDER — VERAPAMIL HCL 80 MG PO TABS
80.0000 mg | ORAL_TABLET | Freq: Three times a day (TID) | ORAL | Status: DC
  Administered 2017-06-11: 80 mg via ORAL
  Filled 2017-06-11 (×3): qty 1

## 2017-06-11 NOTE — Progress Notes (Signed)
Subjective/Complaints: No issues overnite ROS- cannot obtain due to MS Objective: Vital Signs: Blood pressure (!) 188/86, pulse 60, temperature 98.7 F (37.1 C), resp. rate 18, height _0  (1.6 m), weight 61.2 kg (134 lb 14.7 oz), SpO2 100 %. No results found. Results for orders placed or performed during the hospital encounter of 06/06/17 (from the past 72 hour(s))  CBC WITH DIFFERENTIAL     Status: Abnormal   Collection Time: 06/09/17  7:02 AM  Result Value Ref Range   WBC 5.2 4.0 - 10.5 K/uL   RBC 3.88 3.87 - 5.11 MIL/uL   Hemoglobin 10.6 (L) 12.0 - 15.0 g/dL   HCT 32.9 (L) 36.0 - 46.0 %   MCV 84.8 78.0 - 100.0 fL   MCH 27.3 26.0 - 34.0 pg   MCHC 32.2 30.0 - 36.0 g/dL   RDW 13.4 11.5 - 15.5 %   Platelets 197 150 - 400 K/uL   Neutrophils Relative % 62 %   Neutro Abs 3.2 1.7 - 7.7 K/uL   Lymphocytes Relative 24 %   Lymphs Abs 1.3 0.7 - 4.0 K/uL   Monocytes Relative 7 %   Monocytes Absolute 0.4 0.1 - 1.0 K/uL   Eosinophils Relative 7 %   Eosinophils Absolute 0.3 0.0 - 0.7 K/uL   Basophils Relative 0 %   Basophils Absolute 0.0 0.0 - 0.1 K/uL  Comprehensive metabolic panel     Status: Abnormal   Collection Time: 06/09/17  7:02 AM  Result Value Ref Range   Sodium 137 135 - 145 mmol/L   Potassium 4.1 3.5 - 5.1 mmol/L   Chloride 104 101 - 111 mmol/L   CO2 26 22 - 32 mmol/L   Glucose, Bld 106 (H) 65 - 99 mg/dL   BUN 32 (H) 6 - 20 mg/dL   Creatinine, Ser 1.51 (H) 0.44 - 1.00 mg/dL   Calcium 9.2 8.9 - 10.3 mg/dL   Total Protein 6.1 (L) 6.5 - 8.1 g/dL   Albumin 3.2 (L) 3.5 - 5.0 g/dL   AST 21 15 - 41 U/L   ALT 13 (L) 14 - 54 U/L   Alkaline Phosphatase 53 38 - 126 U/L   Total Bilirubin 0.4 0.3 - 1.2 mg/dL   GFR calc non Af Amer 34 (L) >60 mL/min   GFR calc Af Amer 39 (L) >60 mL/min    Comment: (NOTE) The eGFR has been calculated using the CKD EPI equation. This calculation has not been validated in all clinical situations. eGFR's persistently <60 mL/min signify possible  Chronic Kidney Disease.    Anion gap 7 5 - 15  Urinalysis, Routine w reflex microscopic     Status: Abnormal   Collection Time: 06/09/17  4:30 PM  Result Value Ref Range   Color, Urine YELLOW YELLOW   APPearance CLEAR CLEAR   Specific Gravity, Urine 1.018 1.005 - 1.030   pH 6.0 5.0 - 8.0   Glucose, UA NEGATIVE NEGATIVE mg/dL   Hgb urine dipstick NEGATIVE NEGATIVE   Bilirubin Urine NEGATIVE NEGATIVE   Ketones, ur NEGATIVE NEGATIVE mg/dL   Protein, ur 30 (A) NEGATIVE mg/dL   Nitrite NEGATIVE NEGATIVE   Leukocytes, UA NEGATIVE NEGATIVE   RBC / HPF TOO NUMEROUS TO COUNT 0 - 5 RBC/hpf   WBC, UA 0-5 0 - 5 WBC/hpf   Bacteria, UA NONE SEEN NONE SEEN   Squamous Epithelial / LPF 0-5 (A) NONE SEEN  Urine Culture     Status: None   Collection Time: 06/09/17  4:30  PM  Result Value Ref Range   Specimen Description URINE, CATHETERIZED    Special Requests NONE    Culture NO GROWTH    Report Status 06/10/2017 FINAL      HEENT: normal Cardio: RRR and no murmur Resp: CTA B/L and unlabored GI: BS positive and NT, ND Extremity:  No Edema Skin:   Intact Neuro: Lethargic, will squuze with BUE but poor release, clonus at bilateral ankles, absent Left patellar reflex, normal right patellar Musc/Skel:  Other no pain with UE or LE PROM, no jt effusions noted Gen NAD   Assessment/Plan: 1. 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 Physiatrist is providing close team supervision and 24 hour management of active medical problems listed below. Physiatrist and rehab team continue to assess barriers to discharge/monitor patient progress toward functional and medical goals. FIM: Function - Bathing Position: Shower Body parts bathed by helper: Right arm, Chest, Left arm, Abdomen, Front perineal area, Buttocks, Right upper leg, Left upper leg, Right lower leg, Left  lower leg, Back Assist Level: 2 helpers  Function- Upper Body Dressing/Undressing What is the patient wearing?: Pull over shirt/dress Pull over shirt/dress - Perfomed by helper: Thread/unthread right sleeve, Put head through opening, Thread/unthread left sleeve, Pull shirt over trunk Assist Level: 2 helpers Function - Lower Body Dressing/Undressing What is the patient wearing?: Pants, Non-skid slipper socks Position: Bed Pants- Performed by helper: Thread/unthread right pants leg, Pull pants up/down, Thread/unthread left pants leg Non-skid slipper socks- Performed by helper: Don/doff right sock, Don/doff left sock Socks - Performed by helper: Don/doff left sock, Don/doff right sock Assist for footwear: Dependant Assist for lower body dressing: 2 Helpers  Function - Toileting Toileting activity did not occur: No continent bowel/bladder event Toileting steps completed by helper: Adjust clothing prior to toileting, Performs perineal hygiene, Adjust clothing after toileting Assist level: Two helpers  Function - Air cabin crew transfer activity did not occur: Safety/medical concerns Toilet transfer assistive device: Bedside commode Assist level to toilet: 2 helpers (per [employer] report) Assist level to bedside commode (at bedside): 2 helpers Assist level from bedside commode (at bedside): 2 helpers  Function - Chair/bed transfer Chair/bed transfer method: Stand pivot Chair/bed transfer assist level: Total assist (Pt < 25%) Chair/bed transfer assistive device: Mechanical lift Mechanical lift: Stedy Chair/bed transfer details: Tactile cues for initiation  Function - Locomotion: Wheelchair Type: Manual Max wheelchair distance: 68f  Assist Level: Moderate assistance (Pt 50 - 74%) Wheel 50 feet with 2 turns activity did not occur: Refused Wheel 150 feet activity did not occur: Safety/medical concerns Function - Locomotion: Ambulation Assistive device: Parallel bars Max  distance: 2 Assist level: Maximal assist (Pt 25 - 49%) Walk 10 feet activity did not occur: Safety/medical concerns Walk 50 feet with 2 turns activity did not occur: Safety/medical concerns Walk 150 feet activity did not occur: Safety/medical concerns Walk 10 feet on uneven surfaces activity did not occur: Safety/medical concerns  Function - Comprehension Comprehension: Auditory Comprehension assist level: Understands basic less than 25% of the time/ requires cueing >75% of the time  Function - Expression Expression: Verbal Expression assist level: Expresses basis less than 25% of the time/requires cueing >75% of the time.  Function - Social Interaction Social Interaction assist level: Interacts appropriately less than 25% of the time. May be withdrawn or combative.  Function - Problem Solving Problem solving assist level: Solves basic less than 25%  of the time - needs direction nearly all the time or does not effectively solve problems and may need a restraint for safety  Function - Memory Memory assist level: Recognizes or recalls less than 25% of the time/requires cueing greater than 75% of the time Patient normally able to recall (first 3 days only): None of the above  Medical Problem List and Plan: 1. 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 Formulized individual treatment plan based on medical history, underlying problem and comorbidities. 2. DVT Prophylaxis/Anticoagulation: Eliquis 3. Pain Management: Tylenol 4. Mood: Prozac 40 mg daily, Remeron 15 mg daily at bedtime- both of which which will be d/ced due to somnolence -team to provide egosupport as cognitively/linguistically appropriate -family  supportive as well 5. Neuropsych: This patient is capable of making decisions on herown behalf. 6. Skin/Wound Care: Routine skin checks 7. Fluids/Electrolytes/Nutrition: RoutineI&O's , increased BUN and Creat, IVF at noc 8.PAF.Cardiac rate control. Continue Eliquis- monitor Hgb 9.Hypertension.Coreg 25 mg BID,Cozaar 100 mg daily,verapamil 180 mg twice a day 10.Hyperlipidemia. Lipitor 11.  Somnolence- not much change last noc off SSRIs LOS (Days) 5 A FACE TO FACE EVALUATION WAS PERFORMED  KIRSTEINS,ANDREW E 06/11/2017, 6:50 AM

## 2017-06-11 NOTE — Progress Notes (Signed)
Social Work Patient ID: Virgel Gess, female   DOB: [birth year], 71 y.o.   MRN: [id number]  Met with daughter to discuss team conference goals-mod/max level and target discharge date 6/29. Aware of the team's  Recommendation of 24 hr physical care and to consider NHP. Daughter feels she needs to take Mom home she would die in a nursing home. She feels pt does more for her than our staff. Asked her to come during therapies And see if pt does participate more and interact more. Discussed can get PCS which is 3-4 hours but not an 8 hour shift. Daughter does have some CAP program paperwork to complete. Made aware of the waiting List for this program. Daughter is aware her Mom has severe deficits and will require care, now trying to come up with a plan for this. She will stay for therapies this afternoon before work and see if pt interacts more and Is more alert. Will work on a realistic plan for both pt and daughter.

## 2017-06-11 NOTE — Plan of Care (Signed)
Problem: RH Balance Goal: LTG: Patient will maintain dynamic sitting balance (OT) LTG:  Patient will maintain dynamic sitting balance with assistance during activities of daily living (OT)  Goal downgraded 6/20 - ESD Goal: LTG Patient will maintain dynamic standing with ADLs (OT) LTG:  Patient will maintain dynamic standing balance with assist during activities of daily living (OT)   Outcome: Not Applicable Date Met: 74/73/40 D/c goal 6/20 - ESD  Problem: RH Eating Goal: LTG Patient will perform eating w/assist, cues/equip (OT) LTG: Patient will perform eating with assist, with/without cues using equipment (OT)  Goal downgraded 6/20 - ESD  Problem: RH Grooming Goal: LTG Patient will perform grooming w/assist,cues/equip (OT) LTG: Patient will perform grooming with assist, with/without cues using equipment (OT)  Goal downgraded 6/20 - ESD  Problem: RH Bathing Goal: LTG Patient will bathe with assist, cues/equipment (OT) LTG: Patient will bathe specified number of body parts with assist with/without cues using equipment (position)  (OT)  Goal downgraded 6/20 - ESD  Problem: RH Dressing Goal: LTG Patient will perform upper body dressing (OT) LTG Patient will perform upper body dressing with assist, with/without cues (OT).  Goal downgraded 6/20 - ESD Goal: LTG Patient will perform lower body dressing w/assist (OT) LTG: Patient will perform lower body dressing with assist, with/without cues in positioning using equipment (OT)  Goal downgraded 6/20 - ESD  Problem: RH Toileting Goal: LTG Patient will perform toileting w/assist, cues/equip (OT) LTG: Patient will perform toiletiing (clothes management/hygiene) with assist, with/without cues using equipment (OT)  Goal downgraded 6/20 - ESD  Problem: RH Toilet Transfers Goal: LTG Patient will perform toilet transfers w/assist (OT) LTG: Patient will perform toilet transfers with assist, with/without cues using equipment (OT)  Goal  downgraded 6/20 - ESD  Problem: RH Tub/Shower Transfers Goal: LTG Patient will perform tub/shower transfers w/assist (OT) LTG: Patient will perform tub/shower transfers with assist, with/without cues using equipment (OT)  D/c goal ESD 6/20  Problem: RH Memory Goal: LTG Patient will demonstrate ability for day to day (OT) LTG:  Patient will demonstrate ability for day to day recall/carryover during activities of daily living with assist  (OT)  Goal downgraded 6/20 - ESD  Problem: RH Attention Goal: LTG Patient will demonstrate focused/sustained (OT) LTG:  Patient will demonstrate focused/sustained/selective/alternating/divided attention during functional activities in specific environment with assist for # of minutes  (OT)  Goal downgraded 6/20 - ESD  Problem: RH Awareness Goal: LTG: Patient will demonstrate intellectual/emergent (OT) LTG: Patient will demonstrate intellectual/emergent/anticipatory awareness with assist during a functional activity  (OT)  Goal downgraded 6/20 - ESD  Comments: Goal downgraded 6/20 - ESD

## 2017-06-11 NOTE — Progress Notes (Addendum)
Occupational Therapy Session Note  Patient Details  Name: Christina [name] MRN: [id number] Date of Birth: 06/13/1946  Today's Date: 06/11/2017 OT Individual Time: 2527-1292 OT Individual Time Calculation (min): 55 min   Short Term Goals: Week 1:  OT Short Term Goal 1 (Week 1): Pt will maintain sitting balance with MIN A in prep for toileting OT Short Term Goal 2 (Week 1): Pt will squat pivot transfer with MAX A to w/c in prep for BSC transfer OT Short Term Goal 3 (Week 1): Pt will don pullover shirt with MAX A OT Short Term Goal 4 (Week 1): Pt will maintain arousal for 5 min  OT Short Term Goal 5 (Week 1): Pt will locate 4/4 grooming items on sink with MIN VC  Skilled Therapeutic Interventions/Progress Updates:    Pt seen for OT treatment session focused on initiation, attention to R, and purposeful engagement. Pt met therapy dog in the hallway, with max multimodal cues, pt bruoght attention to dog on  L side. Therapy dog placed his head in pt's lap, then pt initiated petting therapy dog for ~ 20 seconds! Rec therapist provided pt with hand sanitizer and demonstrated motion to wash hands. Pt initiated rubbing R hand onto L hand but required hand over hand A to maintain attention to task and complete thoroughly. Pt brought to therapy gym. Pt scratching between legs and noted to be incontinent of bladder. Total A squat-pivot wc>bed. Max multimodal cues and hand-over hand A to engage pt in doffing pants-overall total A to doff pants, change brief, perform peri-care, and roll L and R. Brought pt into side-lying position on R to promote neutral neck position and engage pt in activity on R side. Pt scanned past midline with max multimodal cues to locate object, only held eyes on object for a few seconds. Gaze more neutral in R sidelying. Pillows placed for neck support,  behind back, and in between legs for comfort. Pt left with needs met.   See Function Navigator for Current Functional  Status.   Therapy/Group: Individual Therapy  Valma Cava 06/11/2017, 2:37 PM

## 2017-06-11 NOTE — Progress Notes (Signed)
Recreational Therapy Session Note  Patient Details  Name: Christina [name] MRN: [id number] Date of Birth: 1946/07/28 Today's Date: 06/11/2017  Pain: no c/o Skilled Therapeutic Interventions/Progress Updates: Pt participated in animal assisted activity/therapy seated w/c level during OT session.  Pt required max multimodal cues to locate and attend to the therapy dog placed on pt's left side.  Therapy dog placed his head in pt's lap & pt initiated and attended to pet the dog ~20 seconds.  Hand sanitizer placed in pts hands and LRT demonstrated hand hygiene to pt.  Pt then initiated rubbing right hand onto left but then required hand over hand for continued attention to task.  Lashonne Shull 06/11/2017, 2:50 PM

## 2017-06-11 NOTE — Progress Notes (Signed)
Speech Language Pathology Daily Session Note  Patient Details  Name: Christina [name] MRN: [id number] Date of Birth: 02/07/1946  Today's Date: 06/11/2017 SLP Individual Time: 1000-1030 SLP Individual Time Calculation (min): 30 min  Short Term Goals: Week 1: SLP Short Term Goal 1 (Week 1): Pt will consume dysphagia 1 diet with necatr thick liquids by spoon without overt s/s of aspiration to demonstrate readiness for diet upgrade. SLP Short Term Goal 2 (Week 1): Pt will consume trials of thin liquids with decreased oral holding and timely swallow initiation to demonstrate readiness for diet uprgrade or instrumental study. SLP Short Term Goal 3 (Week 1): Pt will open eyes and maintain arousal for 10 seconds with Max A cues.  SLP Short Term Goal 4 (Week 1): Pt will name common objects with 50% accuracy and Max A cues.  SLP Short Term Goal 5 (Week 1): Pt will follow 1 step simple directions related to basic ADL tasks in 50% of opportunities with Max A cues.  SLP Short Term Goal 6 (Week 1): Pt will initiate tasks related to basic ADLs in 50% of opportunities with Max A cues.   Skilled Therapeutic Interventions: Skilled treatment session focused on cognitive goals. Upon arrival, patient was awake with her eyes closed while sitting upright in the wheelchair and did not open her eyes throughout the session despite Max-Total A multimodal cues.  SLP facilitated session by providing total A for patient to initiate/follow basic 1-step commands during structured and functional tasks. Patient nonverbal and "hummed" intermittently throughout most of session despite Max A multimodal cues. Patient utilized gestures to answer yes/no questions X 1. Patient left upright in wheelchair with all needs within reach. Continue with current plan of care.    Function:  Cognition Comprehension Comprehension assist level: Understands basic less than 25% of the time/ requires cueing >75% of the time  Expression   Expression  assist level: Expresses basis less than 25% of the time/requires cueing >75% of the time.  Social Interaction Social Interaction assist level: Interacts appropriately less than 25% of the time. May be withdrawn or combative.  Problem Solving Problem solving assist level: Solves basic less than 25% of the time - needs direction nearly all the time or does not effectively solve problems and may need a restraint for safety  Memory Memory assist level: Recognizes or recalls less than 25% of the time/requires cueing greater than 75% of the time    Pain No indications of pain   Therapy/Group: Individual Therapy  Palyn Scrima 06/11/2017, 3:46 PM

## 2017-06-11 NOTE — Plan of Care (Signed)
Problem: RH Balance Goal: LTG Patient will maintain dynamic standing balance (PT) LTG:  Patient will maintain dynamic standing balance with assistance during mobility activities (PT)  Outcome: Not Applicable Date Met: 17/53/01 D/c due to pt progress  Problem: RH Bed Mobility Goal: LTG Patient will perform bed mobility with assist (PT) LTG: Patient will perform bed mobility with assistance, with/without cues (PT).  Downgraded due to pt progress  Problem: RH Bed to Chair Transfers Goal: LTG Patient will perform bed/chair transfers w/assist (PT) LTG: Patient will perform bed/chair transfers with assistance, with/without cues (PT).  Downgraded due to pt progress  Problem: RH Car Transfers Goal: LTG Patient will perform car transfers with assist (PT) LTG: Patient will perform car transfers with assistance (PT).  Outcome: Not Applicable Date Met: 03/26/58 D/c due to SNF transfer at d/c from hospital  Problem: RH Ambulation Goal: LTG Patient will ambulate in home environment (PT) LTG: Patient will ambulate in home environment, # of feet with assistance (PT).  Outcome: Not Applicable Date Met: 13/68/59 D/c due to not a focus  Problem: RH Wheelchair Mobility Goal: LTG Patient will propel w/c in controlled environment (PT) LTG: Patient will propel wheelchair in controlled environment, # of feet with assist (PT)  Downgraded due to pt progress Goal: LTG Patient will propel w/c in home environment (PT) LTG: Patient will propel wheelchair in home environment, # of feet with assistance (PT).  Outcome: Not Applicable Date Met: 92/34/14 Discontinued as not a focus  Problem: RH Stairs Goal: LTG Patient will ambulate up and down stairs w/assist (PT) LTG: Patient will ambulate up and down # of stairs with assistance (PT)  Outcome: Not Applicable Date Met: 43/60/16 Discontinued as not a focus

## 2017-06-11 NOTE — Patient Care Conference (Signed)
Inpatient RehabilitationTeam Conference and Plan of Care Update Date: 06/11/2017   Time: 10:30 AM    Patient Name: Christina [name]      Medical Record Number: [id number]  Date of Birth: [birth year] Sex: Female         Room/Bed: 4M05C/4M05C-01 Payor Info: Payor: MEDICARE / Plan: MEDICARE PART B / Product Type: *No Product type* /    Admitting Diagnosis: L CVA  Admit Date/Time:  06/06/2017  6:21 PM Admission Comments: No comment available   Primary Diagnosis:  <principal problem not specified> Principal Problem: <principal problem not specified>  Patient Active Problem List   Diagnosis Date Noted  . Broca's aphasia   . Cognitive communication disorder   . Cerebellar infarct (HCC) 06/06/2017  . 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 Uncontrolled hypertension 06/04/2017  . Fall at home 06/04/2017  . Altered mental status 06/04/2017  . History of CVA in adulthood 06/04/2017  . AF (paroxysmal atrial fibrillation) (HCC) 06/04/2017  . CKD (chronic kidney disease), stage III 06/04/2017  . Depression 06/04/2017  . Aphasia due to acute stroke (HCC)   . Encephalopathy, hypertensive 05/07/2017  . Enterococcus UTI 05/07/2017  . Hypertensive urgency 05/02/2017  . Hypertensive emergency 05/01/2017  . History of suicidal ideation   . Hypokalemia   . Suicide ideation   . Sleep disturbance   . PAF (paroxysmal atrial fibrillation) (HCC)   . Labile blood pressure   . AKI (acute kidney injury) (HCC)   . Stage 3 chronic kidney disease   . Acute blood loss anemia   . Benign essential HTN   . Adjustment disorder with mixed anxiety and depressed mood   . 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 Left hemiparesis (HCC)   . Primary osteoarthritis of left knee   . Left knee pain   . Diplopia   . Acute cystitis without hematuria   . Acute CVA (cerebrovascular accident) (HCC)   . TIA (transient ischemic attack) 03/14/2017  . History of CVA (cerebrovascular accident) 03/14/2017   . HTN (hypertension) 03/14/2017  . HLD (hyperlipidemia) 03/14/2017  . 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    Expected Discharge Date: Expected Discharge Date: 06/20/17  Team Members Present: Physician leading conference: Dr. Claudette Laws Social Worker Present: Dossie Der, LCSW Nurse Present: Other (comment) Westley Gambles Bridges-RN) PT Present: Teodoro Kil, PT OT Present: Kearney Hard, OT SLP Present: Feliberto Gottron, SLP PPS Coordinator present : Tora Duck, RN, CRRN     Current Status/Progress Goal Weekly Team Focus  Medical   Decreased cognition, poor initiation, aphasia,  Maintain medical stability, manage elevated blood pressures  Neuropsychological evaluation to examine behavioral component   Bowel/Bladder   incontinent of B/B. LBM 06/09/2017  one or less incont episodes q shift  toilet q3hr when awake   Swallow/Nutrition/ Hydration   Dys. 1 textures with nectar-thick liquids via tsp, Max A  Min A  tolerance of current diet, trials of upgraded liquids   ADL's   Total A overall  min A overall  attentio, initiation, awareness, bath/dressing, R sidr attentio, apraxia   Mobility   total for all mobility  downgraded to mod assist  cognitive remediation, functional mobility as able    Communication   Total A, non-purposeful humming   Mod A  following commands, yes/no answers with gestures   Safety/Cognition/ Behavioral Observations  Total A  Mod A  arousal, attention, initiation    Pain   Faces=0; Faces=6 when R buttock touched  Faces=2 or less  assess for pain q shift; turn q 2hr; pink foam to buttocks   Skin   buttocks pink; open blister to R buttock (foam); ecchymosis to nose and chin; abrasions to face  no new skin breakdown; free from infection  assess skin integrity q shift and prn      *See Care Plan and progress notes for long and short-term goals.  Barriers to Discharge: Blood pressure elevation, heavy physical assistance, severe cognitive  deficits    Possible Resolutions to Barriers:  Continue rehabilitation program, continue medication management    Discharge Planning/Teaching Needs:  Daughter would like to take her home, is working on assistance for her while she works. Question if daughter will be able to manage pt at home will require 24 hr physical care this time      Team Discussion:  Goals downgraded to max assist level due to lack of progress and difficulty with initiation and ability to participate. Arousal poor and usually closes eyes during session. Follows one step commands at best. MD trial of ritalin see if helps. Neuro-psych saw and all neurological no behavioral component. Pt will require 24 hr physical care.   Revisions to Treatment Plan:  Downgraded goals-discharge disposition home versus NHP   Continued Need for Acute Rehabilitation Level of Care: The patient requires daily medical management by a physician with specialized training in physical medicine and rehabilitation for the following conditions: Daily direction of a multidisciplinary physical rehabilitation program to ensure safe treatment while eliciting the highest outcome that is of practical value to the patient.: Yes Daily medical management of patient stability for increased activity during participation in an intensive rehabilitation regime.: Yes Daily analysis of laboratory values and/or radiology reports with any subsequent need for medication adjustment of medical intervention for : Neurological problems;Blood pressure problems  Christina [name], Christina [name] 06/11/2017, 12:50 PM

## 2017-06-11 NOTE — Progress Notes (Signed)
Physical Therapy Session Note  Patient Details  Name: Christina [name] MRN: [id number] Date of Birth: [birth year]  Today's Date: 06/11/2017 PT Individual Time: 1515-1610 PT Individual Time Calculation (min): 55 min   Short Term Goals: Week 1:  PT Short Term Goal 1 (Week 1): Pt will perform bed mobility with mod assist  PT Short Term Goal 2 (Week 1): Pt will maintain sitting balance with mod assist  PT Short Term Goal 3 (Week 1): Pt will ambulate 49f with mod assist  PT Short Term Goal 4 (Week 1): Pt will maintain standing balance x 3 minutes with mod assist  PT Short Term Goal 5 (Week 1): Pt will propell wc 751fwith min assist from PT  Skilled Therapeutic Interventions/Progress Updates:    Pt with no indication of pain.  Does appear restless on PT arrival, and she is able to indicate that she needs her brief to be changed with max cues.  Session focus on attention, following 1-step commands, and initiation of movement.  Pt does appear more alert this session, able to respond to yes/no questions <25% of the time with increased time and max cues, but continues to require total to +2 assist for all mobility.   Pt requires total assist for rolling L<>R and +2 assist for hygiene and clothing management.  Supine>sit and squat/pivot to tilt in space w/c on R with total assist, pt does not appear to initiate or attempt to assist at all.  Pt taken to therapy gym and PT adjusted w/c leg rests for improved positioning to maximize safe sitting tolerance.  Pt returned to room at end of session and positioned in tilt in space with QRB in place, call bell in reach and needs met.    Therapy Documentation Precautions:  Precautions Precautions: Fall Restrictions Weight Bearing Restrictions: Yes   See Function Navigator for Current Functional Status.   Therapy/Group: Individual Therapy  CaEarnest Conroyenven-Crew 06/11/2017, 4:36 PM

## 2017-06-11 NOTE — Progress Notes (Signed)
Physical Therapy Session Note  Patient Details  Name: Christina [name] MRN: [id number] Date of Birth: Aug 20, 1946  Today's Date: 06/11/2017 PT Individual Time: 0900-1000 PT Individual Time Calculation (min): 60 min   Short Term Goals: Week 1:  PT Short Term Goal 1 (Week 1): Pt will perform bed mobility with mod assist  PT Short Term Goal 2 (Week 1): Pt will maintain sitting balance with mod assist  PT Short Term Goal 3 (Week 1): Pt will ambulate 17f with mod assist  PT Short Term Goal 4 (Week 1): Pt will maintain standing balance x 3 minutes with mod assist  PT Short Term Goal 5 (Week 1): Pt will propell wc 765fwith min assist from PT  Skilled Therapeutic Interventions/Progress Updates:    no c/o pain based on faces scale.  Session focus on arousal and focus attention with self care tasks, and prolonged upright in tilt table.  Pt continues to keep eyes closed throughout entirety of session (though they were open when PT entered room), and continues with perseverative humming throughout 90% of session.      Pt requires +2 assist for all dressing, initiating less than 25% of the time.  Total assist for bed mobility.  Today pt is dependent for sit<>stand from EOB and unable to achieve full knee extension or support self in standing, which is slightly different than yesterday.  Stand/pivot to w/c dependent.    Pt positioned on tilt table with maximove for upright positioning, weight bearing through BLEs, and extended passive stretch.  Pt positioned at 20, 35, and 45 degrees for ~5 minutes each, BP maintained between 160-161/64 throughout, except when moved to 45 degrees when BP dropped to 142/64 but pt in no apparent distress with drop.  Pt returned to room at end of session, positioned upright in w/c with QRB in place, call bell in reach and needs met.  Nursing updated with need to use hoyer today for transfers.    Therapy Documentation Precautions:  Precautions Precautions:  Fall Restrictions Weight Bearing Restrictions: Yes   See Function Navigator for Current Functional Status.   Therapy/Group: Individual Therapy  CaEarnest Conroyenven-Crew 06/11/2017, 12:10 PM

## 2017-06-11 NOTE — Progress Notes (Signed)
Social Work Marcile Fuquay, Elveria Risingebecca G, LCSW Social Worker Signed Physical Medicine and Rehabilitation  Patient Care Conference Date of Service: 06/11/2017 12:50 PM      Hide copied text Hover for attribution information Inpatient RehabilitationTeam Conference and Plan of Care Update Date: 06/11/2017   Time: 10:30 AM      Patient Name: Christina [name]      Medical Record Number: [id number]  Date of Birth: June 02, 1946 Sex: Female         Room/Bed: 4M05C/4M05C-01 Payor Info: Payor: MEDICARE / Plan: MEDICARE PART B / Product Type: *No Product type* /     Admitting Diagnosis: L CVA  Admit Date/Time:  06/06/2017  6:21 PM Admission Comments: No comment available    Primary Diagnosis:  <principal problem not specified> Principal Problem: <principal problem not specified>       Patient Active Problem List    Diagnosis Date Noted  . Broca's aphasia    . Cognitive communication disorder    . Cerebellar infarct (HCC) 06/06/2017  . 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 Uncontrolled hypertension 06/04/2017  . Fall at home 06/04/2017  . Altered mental status 06/04/2017  . History of CVA in adulthood 06/04/2017  . AF (paroxysmal atrial fibrillation) (HCC) 06/04/2017  . CKD (chronic kidney disease), stage III 06/04/2017  . Depression 06/04/2017  . Aphasia due to acute stroke (HCC)    . Encephalopathy, hypertensive 05/07/2017  . Enterococcus UTI 05/07/2017  . Hypertensive urgency 05/02/2017  . Hypertensive emergency 05/01/2017  . History of suicidal ideation    . Hypokalemia    . Suicide ideation    . Sleep disturbance    . PAF (paroxysmal atrial fibrillation) (HCC)    . Labile blood pressure    . AKI (acute kidney injury) (HCC)    . Stage 3 chronic kidney disease    . Acute blood loss anemia    . Benign essential HTN    . Adjustment disorder with mixed anxiety and depressed mood    . 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 Left hemiparesis (HCC)    . Primary  osteoarthritis of left knee    . Left knee pain    . Diplopia    . Acute cystitis without hematuria    . Acute CVA (cerebrovascular accident) (HCC)    . TIA (transient ischemic attack) 03/14/2017  . History of CVA (cerebrovascular accident) 03/14/2017  . HTN (hypertension) 03/14/2017  . HLD (hyperlipidemia) 03/14/2017  . 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 Claudette LawsAndrew Kirsteins Social Worker Present: Dossie DerBecky Maurico Perrell, LCSW Nurse Present: Other (comment) Westley Gambles(Melaine Bridges-RN) PT Present: Teodoro Kilaitlin Penven-Crew, PT OT Present: Kearney HardElisabeth Doe, OT SLP Present: Feliberto Gottronourtney Payne, SLP PPS Coordinator present : Tora DuckMarie Noel, RN, CRRN       Current Status/Progress Goal Weekly Team Focus  Medical     Decreased cognition, poor initiation, aphasia,  Maintain medical stability, manage elevated blood pressures  Neuropsychological evaluation to examine behavioral component   Bowel/Bladder     incontinent of B/B. LBM 06/09/2017  one or less incont episodes q shift  toilet q3hr when awake   Swallow/Nutrition/ Hydration     Dys. 1 textures with nectar-thick liquids via tsp, Max A  Min A  tolerance of current diet, trials of upgraded liquids   ADL's     Total A overall  min A overall  attentio, initiation,  awareness, bath/dressing, R sidr attentio, apraxia   Mobility     total for all mobility  downgraded to mod assist  cognitive remediation, functional mobility as able    Communication     Total A, non-purposeful humming   Mod A  following commands, yes/no answers with gestures   Safety/Cognition/ Behavioral Observations   Total A  Mod A  arousal, attention, initiation    Pain     Faces=0; Faces=6 when R buttock touched  Faces=2 or less  assess for pain q shift; turn q 2hr; pink foam to buttocks   Skin     buttocks pink; open blister to R buttock (foam); ecchymosis to nose and  chin; abrasions to face  no new skin breakdown; free from infection  assess skin integrity q shift and prn     *See Care Plan and progress notes for long and short-term goals.   Barriers to Discharge: Blood pressure elevation, heavy physical assistance, severe cognitive deficits     Possible Resolutions to Barriers:  Continue rehabilitation program, continue medication management     Discharge Planning/Teaching Needs:  Daughter would like to take her home, is working on assistance for her while she works. Question if daughter will be able to manage pt at home will require 24 hr physical care this time      Team Discussion:  Goals downgraded to max assist level due to lack of progress and difficulty with initiation and ability to participate. Arousal poor and usually closes eyes during session. Follows one step commands at best. MD trial of ritalin see if helps. Neuro-psych saw and all neurological no behavioral component. Pt will require 24 hr physical care.   Revisions to Treatment Plan:  Downgraded goals-discharge disposition home versus NHP    Continued Need for Acute Rehabilitation Level of Care: The patient requires daily medical management by a physician with specialized training in physical medicine and rehabilitation for the following conditions: Daily direction of a multidisciplinary physical rehabilitation program to ensure safe treatment while eliciting the highest outcome that is of practical value to the patient.: Yes Daily medical management of patient stability for increased activity during participation in an intensive rehabilitation regime.: Yes Daily analysis of laboratory values and/or radiology reports with any subsequent need for medication adjustment of medical intervention for : Neurological problems;Blood pressure problems   Lucy Chris 06/11/2017, 12:50 PM       Patient ID: Christina Negus, female   DOB: June 22, 1946, 71 y.o.   MRN: [id number]

## 2017-06-12 ENCOUNTER — Inpatient Hospital Stay (HOSPITAL_COMMUNITY): Payer: Medicare Other | Admitting: Physical Therapy

## 2017-06-12 ENCOUNTER — Inpatient Hospital Stay (HOSPITAL_COMMUNITY): Payer: Self-pay | Admitting: Occupational Therapy

## 2017-06-12 ENCOUNTER — Inpatient Hospital Stay (HOSPITAL_COMMUNITY): Payer: Self-pay | Admitting: Physical Therapy

## 2017-06-12 ENCOUNTER — Inpatient Hospital Stay (HOSPITAL_COMMUNITY): Payer: Medicare Other | Admitting: Speech Pathology

## 2017-06-12 ENCOUNTER — Inpatient Hospital Stay (HOSPITAL_COMMUNITY): Payer: Self-pay

## 2017-06-12 DIAGNOSIS — I639 Cerebral infarction, unspecified: Secondary | ICD-10-CM | POA: Diagnosis not present

## 2017-06-12 DIAGNOSIS — I6340 Cerebral infarction due to embolism of unspecified cerebral artery: Secondary | ICD-10-CM | POA: Diagnosis not present

## 2017-06-12 DIAGNOSIS — R41841 Cognitive communication deficit: Secondary | ICD-10-CM | POA: Diagnosis not present

## 2017-06-12 DIAGNOSIS — I69351 Hemiplegia and hemiparesis following cerebral infarction affecting right dominant side: Secondary | ICD-10-CM | POA: Diagnosis not present

## 2017-06-12 DIAGNOSIS — R4701 Aphasia: Secondary | ICD-10-CM | POA: Diagnosis not present

## 2017-06-12 MED ORDER — AMLODIPINE BESYLATE 5 MG PO TABS
5.0000 mg | ORAL_TABLET | Freq: Every day | ORAL | Status: DC
  Administered 2017-06-12 – 2017-06-14 (×3): 5 mg via ORAL
  Filled 2017-06-12 (×2): qty 1

## 2017-06-12 MED ORDER — LOSARTAN POTASSIUM 50 MG PO TABS
50.0000 mg | ORAL_TABLET | Freq: Every day | ORAL | Status: DC
  Administered 2017-06-13 – 2017-06-18 (×6): 50 mg via ORAL
  Filled 2017-06-12 (×6): qty 1

## 2017-06-12 NOTE — Progress Notes (Signed)
Physical Therapy Session Note  Patient Details  Name: Christina [name] MRN: [id number] Date of Birth: Mar 19, 1946  Today's Date: 06/12/2017 PT Individual Time: 1405-1500 PT Individual Time Calculation (min): 55 min   Short Term Goals: Week 1:  PT Short Term Goal 1 (Week 1): Pt will perform bed mobility with mod assist  PT Short Term Goal 2 (Week 1): Pt will maintain sitting balance with mod assist  PT Short Term Goal 3 (Week 1): Pt will ambulate 2520ft with mod assist  PT Short Term Goal 4 (Week 1): Pt will maintain standing balance x 3 minutes with mod assist  PT Short Term Goal 5 (Week 1): Pt will propell wc 6175ft with min assist from PT  Skilled Therapeutic Interventions/Progress Updates: Pt presented in TIS chair with eyes open and holding ball orange in R hand. Pt transported to rehab gym and PTA attempted to have pt follow command to release ball into PTA's hand. Pt unable to follow command despite max multimodal cues. Pt trialed in standing frame with pt encouraged to reach for letters on mirror however pt unable to follow command or acknowledge. Pt in standing frame x 12 min before increased L lean and forward posture noted. Pt then noted to have episode of incontinence. Pt returned to room and performed stand pivot maxA x 2 to bed. Pt required hand over hand assist for reaching bed rails and maxA for rolling L/R. NT and PTA completed peri-hygiene and pt remained in bed at end of session.      Therapy Documentation Precautions:  Precautions Precautions: Fall Restrictions Weight Bearing Restrictions: Yes General:   Vital Signs: Therapy Vitals Temp: 98.8 F (37.1 C) Temp Source: Oral Pulse Rate: 69 Resp: 20 BP: (!) 167/83 Patient Position (if appropriate): Lying Oxygen Therapy SpO2: 100 % O2 Device: Not Delivered     See Function Navigator for Current Functional Status.   Therapy/Group: Individual Therapy  Dayna Geurts  Bionca Mckey, PTA  06/12/2017, 4:53 PM

## 2017-06-12 NOTE — Progress Notes (Signed)
Speech Language Pathology Daily Session Note  Patient Details  Name: Christina [name] MRN: [id number] Date of Birth: [birth year]  Today's Date: 06/12/2017 SLP Individual Time: 8657-84690730-0815 SLP Individual Time Calculation (min): 45 min  Short Term Goals: Week 1: SLP Short Term Goal 1 (Week 1): Pt will consume dysphagia 1 diet with necatr thick liquids by spoon without overt s/s of aspiration to demonstrate readiness for diet upgrade. SLP Short Term Goal 2 (Week 1): Pt will consume trials of thin liquids with decreased oral holding and timely swallow initiation to demonstrate readiness for diet uprgrade or instrumental study. SLP Short Term Goal 3 (Week 1): Pt will open eyes and maintain arousal for 10 seconds with Max A cues.  SLP Short Term Goal 4 (Week 1): Pt will name common objects with 50% accuracy and Max A cues.  SLP Short Term Goal 5 (Week 1): Pt will follow 1 step simple directions related to basic ADL tasks in 50% of opportunities with Max A cues.  SLP Short Term Goal 6 (Week 1): Pt will initiate tasks related to basic ADLs in 50% of opportunities with Max A cues.   Skilled Therapeutic Interventions: Skilled treatment session focused on dysphagia and cognitive goals. SLP facilitated session by providing total A for self-feeding breakfast meal of Dys. 1 textures with nectar-thick liquids via tsp. Patient consumed meal without overt s/s of aspiration. Patient continues to be nonverbal throughout session and total A to follow commands/initiate tasks. Patient left upright in bed with all needs within reach. Continue with current plan of care.      Function:  Eating Eating   Modified Consistency Diet: Yes Eating Assist Level: Helper feeds patient           Cognition Comprehension Comprehension assist level: Understands basic less than 25% of the time/ requires cueing >75% of the time  Expression   Expression assist level: Expresses basis less than 25% of the time/requires cueing >75% of  the time.  Social Interaction Social Interaction assist level: Interacts appropriately less than 25% of the time. May be withdrawn or combative.  Problem Solving Problem solving assist level: Solves basic less than 25% of the time - needs direction nearly all the time or does not effectively solve problems and may need a restraint for safety  Memory Memory assist level: Recognizes or recalls less than 25% of the time/requires cueing greater than 75% of the time    Pain No/Denies Pain   Therapy/Group: Individual Therapy  Yadier Bramhall 06/12/2017, 3:59 PM

## 2017-06-12 NOTE — Progress Notes (Signed)
Occupational Therapy Note  Patient Details  Name: Christina [name] MRN: [id number] Date of Birth: Apr 01, 1946  Today's Date: 06/12/2017 OT Individual Time: 1130-1200 OT Individual Time Calculation (min): 30 min   Pt with no s/s of pain Individual Therapy  Pt resting in w/c upon arrival with daughter present.  Focus on alertness and following one step commands.  OTA and daughter attempted repeatedly throughout session to engage pt in opening eyes and following one step commands-holding beach ball, catching beach ball, tossing beach ball.  Pt did not actively participate in any tasks during session.  Pt did open eyes the last 10 mins of session with no impact on task initiation or attending to a task.  Pt returned to room and remained in w/c with QRB in place.    Lavone NeriLanier, [name] [facility] 06/12/2017, 3:01 PM

## 2017-06-12 NOTE — Progress Notes (Signed)
Occupational Therapy Session Note  Patient Details  Name: Christina [name] MRN: [id number] Date of Birth: Oct 29, 1946  Today's Date: 06/12/2017 OT Individual Time: [id number] OT Individual Time Calculation (min): 57 min    Short Term Goals: Week 1:  OT Short Term Goal 1 (Week 1): Pt will maintain sitting balance with MIN A in prep for toileting OT Short Term Goal 2 (Week 1): Pt will squat pivot transfer with MAX A to w/c in prep for BSC transfer OT Short Term Goal 3 (Week 1): Pt will don pullover shirt with MAX A OT Short Term Goal 4 (Week 1): Pt will maintain arousal for 5 min  OT Short Term Goal 5 (Week 1): Pt will locate 4/4 grooming items on sink with MIN VC  Skilled Therapeutic Interventions/Progress Updates:    OT treatment session addressing attention and initiation with BADLs. Pt incontinent of bladder upon OT arrival. Required total A for rolling, peri-care, and brief change. OT placed wash cloth on thigh and pt initiated washing R thigh, but unable to move to next body part with max multimodal cues. Pt followed 1 step command inconsistently and initiated lifting R LE to place into pant leg with max multimodal cues. Utilized backwards chaining techniques, and pt able to try to pull pant up thigh briefly but lost attention 2/2 internal distractions. Total A squat-pivot to TIS wc on R. Total A to doff shirt, utilized backwards chaining again for UB dressing. OT threaded L UE for pt, then pt initiated next step and was able to pull shirt overhead. Pt brought to the sink and engaged in grooming task. Pt reached for brush on L side, but did not pick it up. Brought brush to midline and provided hand-over hand A to reach w/ R UE and grasp brush. Pt then brought brush to head without OT assist and began brushing front of hair while looking in mirror at midline. Pt perseverating and unable to move brush to other parts of head requiring total A to terminate task. Pt left tilted in TIS wc with safety belt  on and daughter present.   Therapy Documentation Precautions:  Precautions Precautions: Fall Restrictions Weight Bearing Restrictions: Yes Pain: Pain Assessment Faces Pain Scale: No hurt  See Function Navigator for Current Functional Status.   Therapy/Group: Individual Therapy  Christina [name] 06/12/2017, 12:52 PM

## 2017-06-12 NOTE — Progress Notes (Signed)
During OT kept repetitive motion rubbing right knee resulting in 1 cm. Abrasion. Cleansed and foam applied. Need to keep orange in right hand when unoccupied with therapy.

## 2017-06-12 NOTE — Progress Notes (Signed)
Social Work Patient ID: Christina [name], female   DOB: 12-10-1946, 71 y.o.   MRN: [id number]  Met with daughter to discuss plans, she wants to be here for pt's therapies due to she feels she participates more when  She is here. Daughter somewhat unrealistic regarding pt's level and feeling she is taking to her and moving more when she is here. Will have her begin to do hands on care, so she will realize how much care Mom is And if she still wants to take her home will work toward this. Completing form for CAP and will do PCS form.

## 2017-06-12 NOTE — Progress Notes (Signed)
Subjective/Complaints: Started on ritalin this am, per RN and SLP more alert this am ROS- cannot obtain due to MS Objective: Vital Signs: Blood pressure (!) 168/53, pulse 75, temperature 98.5 F (36.9 C), temperature source Oral, resp. rate 18, height 5\' 3"  (1.6 m), weight 67.6 kg (149 lb 0.5 oz), SpO2 100 %. No results found. Results for orders placed or performed during the hospital encounter of 06/06/17 (from the past 72 hour(s))  Urinalysis, Routine w reflex microscopic     Status: Abnormal   Collection Time: 06/09/17  4:30 PM  Result Value Ref Range   Color, Urine YELLOW YELLOW   APPearance CLEAR CLEAR   Specific Gravity, Urine 1.018 1.005 - 1.030   pH 6.0 5.0 - 8.0   Glucose, UA NEGATIVE NEGATIVE mg/dL   Hgb urine dipstick NEGATIVE NEGATIVE   Bilirubin Urine NEGATIVE NEGATIVE   Ketones, ur NEGATIVE NEGATIVE mg/dL   Protein, ur 30 (A) NEGATIVE mg/dL   Nitrite NEGATIVE NEGATIVE   Leukocytes, UA NEGATIVE NEGATIVE   RBC / HPF TOO NUMEROUS TO COUNT 0 - 5 RBC/hpf   WBC, UA 0-5 0 - 5 WBC/hpf   Bacteria, UA NONE SEEN NONE SEEN   Squamous Epithelial / LPF 0-5 (A) NONE SEEN  Urine Culture     Status: None   Collection Time: 06/09/17  4:30 PM  Result Value Ref Range   Specimen Description URINE, CATHETERIZED    Special Requests NONE    Culture NO GROWTH    Report Status 06/10/2017 FINAL      HEENT: normal Cardio: RRR and no murmur Resp: CTA B/L and unlabored GI: BS positive and NT, ND Extremity:  No Edema Skin:   Intact Neuro: Lethargic, will squuze with BUE but poor release, clonus at bilateral ankles, absent Left patellar reflex, normal right patellar Musc/Skel:  Other no pain with UE or LE PROM, no jt effusions noted Gen NAD   Assessment/Plan: 1. 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 Physiatrist is providing close team  supervision and 24 hour management of active medical problems listed below. Physiatrist and rehab team continue to assess barriers to discharge/monitor patient progress toward functional and medical goals. FIM: Function - Bathing Position: Shower Body parts bathed by helper: Right arm, Chest, Left arm, Abdomen, Front perineal area, Buttocks, Right upper leg, Left upper leg, Right lower leg, Left lower leg, Back Assist Level: 2 helpers  Function- Upper Body Dressing/Undressing What is the patient wearing?: Pull over shirt/dress Pull over shirt/dress - Perfomed by helper: Thread/unthread right sleeve, Thread/unthread left sleeve, Put head through opening, Pull shirt over trunk Assist Level: 2 helpers Function - Lower Body Dressing/Undressing What is the patient wearing?: Pants, Non-skid slipper socks Position: Bed Pants- Performed by helper: Thread/unthread right pants leg, Thread/unthread left pants leg, Pull pants up/down Non-skid slipper socks- Performed by helper: Don/doff right sock, Don/doff left sock Socks - Performed by helper: Don/doff left sock, Don/doff right sock Assist for footwear: Dependant Assist for lower body dressing: 2 Helpers  Function - Toileting Toileting activity did not occur: No continent bowel/bladder event Toileting steps completed by helper: Adjust clothing prior to toileting, Performs perineal hygiene, Adjust clothing after toileting Assist level: Two helpers  Function - Archivist transfer activity did not occur: Safety/medical concerns Toilet transfer assistive device: Bedside commode Assist level to toilet: 2 helpers (per [employer] report) Assist level to bedside commode (at bedside):  2 helpers Assist level from bedside commode (at bedside): 2 helpers  Function - Chair/bed transfer Chair/bed transfer method: Stand pivot Chair/bed transfer assist level: dependent (Pt equals 0%) Chair/bed transfer assistive device: Mechanical  lift Mechanical lift: Stedy Chair/bed transfer details: Manual facilitation for weight shifting, Manual facilitation for placement, Tactile cues for placement, Tactile cues for sequencing, Tactile cues for posture, Verbal cues for sequencing, Verbal cues for technique  Function - Locomotion: Wheelchair Type: Manual Max wheelchair distance: 4530ft  Assist Level: Moderate assistance (Pt 50 - 74%) Wheel 50 feet with 2 turns activity did not occur: Refused Wheel 150 feet activity did not occur: Safety/medical concerns Function - Locomotion: Ambulation Assistive device: Parallel bars Max distance: 2 Assist level: Maximal assist (Pt 25 - 49%) Walk 10 feet activity did not occur: Safety/medical concerns Walk 50 feet with 2 turns activity did not occur: Safety/medical concerns Walk 150 feet activity did not occur: Safety/medical concerns Walk 10 feet on uneven surfaces activity did not occur: Safety/medical concerns  Function - Comprehension Comprehension: Auditory Comprehension assist level: Understands basic less than 25% of the time/ requires cueing >75% of the time  Function - Expression Expression: Verbal Expression assist level: Expresses basis less than 25% of the time/requires cueing >75% of the time.  Function - Social Interaction Social Interaction assist level: Interacts appropriately less than 25% of the time. May be withdrawn or combative.  Function - Problem Solving Problem solving assist level: Solves basic less than 25% of the time - needs direction nearly all the time or does not effectively solve problems and may need a restraint for safety  Function - Memory Memory assist level: Recognizes or recalls less than 25% of the time/requires cueing greater than 75% of the time Patient normally able to recall (first 3 days only): None of the above  Medical Problem List and Plan: 1. 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 DVT Prophylaxis/Anticoagulation: Eliquis 3. Pain Management: Tylenol 4. Mood: Prozac 40 mg daily, Remeron 15 mg daily at bedtime- both of which which will be d/ced due to somnolence, started on Ritalin with some increased alertness -team to provide egosupport as cognitively/linguistically appropriate -family supportive as well 5. Neuropsych: This patient is capable of making decisions on herown behalf. 6. Skin/Wound Care: Routine skin checks 7. Fluids/Electrolytes/Nutrition: RoutineI&O's , increased BUN and Creat, IVF at noc May be in part due to Cozaar since the intake appears good per RN   8.PAF.Cardiac rate control. Continue Eliquis- monitor Hgb 9.Hypertension.Coreg 25 mg BID,Cozaar 100 mg daily,verapamil 80 mg TID-  Vitals:   06/11/17 2131 06/12/17 0500  BP: (!) 114/35 (!) 168/53  Pulse: (!) 55 75  Resp:    Temp:  98.5 F (36.9 C)   10.Hyperlipidemia. Lipitor 11.  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  Bradycardia likely due to Coreg and Verapamil  May need to change antihypertensives  Consider amlodipine    LOS (Days) 6 A FACE TO FACE EVALUATION WAS PERFORMED  Christina [name] E 06/12/2017, 7:47 AM

## 2017-06-13 ENCOUNTER — Inpatient Hospital Stay (HOSPITAL_COMMUNITY): Payer: Medicare Other | Admitting: Speech Pathology

## 2017-06-13 ENCOUNTER — Inpatient Hospital Stay (HOSPITAL_COMMUNITY): Payer: Self-pay | Admitting: Occupational Therapy

## 2017-06-13 ENCOUNTER — Inpatient Hospital Stay (HOSPITAL_COMMUNITY): Payer: Self-pay | Admitting: Physical Therapy

## 2017-06-13 DIAGNOSIS — I639 Cerebral infarction, unspecified: Secondary | ICD-10-CM | POA: Diagnosis not present

## 2017-06-13 DIAGNOSIS — R4701 Aphasia: Secondary | ICD-10-CM | POA: Diagnosis not present

## 2017-06-13 DIAGNOSIS — I6340 Cerebral infarction due to embolism of unspecified cerebral artery: Secondary | ICD-10-CM | POA: Diagnosis not present

## 2017-06-13 DIAGNOSIS — R41841 Cognitive communication deficit: Secondary | ICD-10-CM | POA: Diagnosis not present

## 2017-06-13 DIAGNOSIS — I69351 Hemiplegia and hemiparesis following cerebral infarction affecting right dominant side: Secondary | ICD-10-CM | POA: Diagnosis not present

## 2017-06-13 DIAGNOSIS — I10 Essential (primary) hypertension: Secondary | ICD-10-CM | POA: Diagnosis not present

## 2017-06-13 NOTE — Plan of Care (Signed)
Problem: RH SKIN INTEGRITY Goal: RH STG SKIN FREE OF INFECTION/BREAKDOWN Skin free of infection/breakdown with mod assist  Outcome: Not Progressing Blister to buttock, foam applied

## 2017-06-13 NOTE — Progress Notes (Signed)
Physical Therapy Session Note  Patient Details  Name: Christina [name] MRN: [id number] Date of Birth: Jan 14, 1946  Today's Date: 06/13/2017 PT Individual Time: 0900-0955 PT Individual Time Calculation (min): 55 min   Short Term Goals: Week 1:  PT Short Term Goal 1 (Week 1): Pt will perform bed mobility with mod assist  PT Short Term Goal 2 (Week 1): Pt will maintain sitting balance with mod assist  PT Short Term Goal 3 (Week 1): Pt will ambulate 2420ft with mod assist  PT Short Term Goal 4 (Week 1): Pt will maintain standing balance x 3 minutes with mod assist  PT Short Term Goal 5 (Week 1): Pt will propell wc 10475ft with min assist from PT  Skilled Therapeutic Interventions/Progress Updates:   Bed mobility with max assist for rolling R and L as well as max assist to sitting EOB. Max multimodal cues of UE and LE management as well as sequencing   Sitting balance. EOB x 10 minutes with max assist. Forced use of the LE to weight bear through hand to decrease tone and improved shoulder/elbow ROM. squat [facility] total assist from PT. PT transported pt to rehab gym   Sit<>stand in parallel bars x 3. Max assist x 2 and total assist x 1. Max multimodal cues for increased participation as well as ue placement and increased anterior weight shift.   Gait in parallel bars.864ft and 2 ft with max assist from PT to advance BLE. Pt able to initiate weight shift, but required PT to blovk the L knee to prevent knee instabilty.   Reaching tasks. Sitting in WC to attempt to grab correct time of 3 fields. Not noted to have increased lethargy and [facility]      Therapy Documentation Precautions:  Precautions Precautions: Fall Restrictions Weight Bearing Restrictions: Yes Vital Signs: Therapy Vitals Temp: 98.9 F (37.2 C) Temp Source: Axillary Pulse Rate: 70 Resp: (!) 22 BP: (!) 191/76 Patient Position (if appropriate): Lying Oxygen Therapy SpO2: 99 % O2 Device: Not  Delivered Pain:   0/10   See Function Navigator for Current Functional Status.   Therapy/Group: Individual Therapy  Golden Popustin E Shafiq Larch 06/13/2017, 9:56 AM

## 2017-06-13 NOTE — Progress Notes (Addendum)
Occupational Therapy Session Note  Patient Details  Name: Christina [name] MRN: [id number] Date of Birth: 1946-04-04  Today's Date: 06/13/2017  Session 1 OT Individual Time: 1035-1200 OT Individual Time Calculation (min): 85 min   Session 2 OT Individual Time: 1515-1530 OT Individual Time Calculation (min): 15 min   Short Term Goals: Week 1:  OT Short Term Goal 1 (Week 1): Pt will maintain sitting balance with MIN A in prep for toileting OT Short Term Goal 2 (Week 1): Pt will squat pivot transfer with MAX A to w/c in prep for BSC transfer OT Short Term Goal 3 (Week 1): Pt will don pullover shirt with MAX A OT Short Term Goal 4 (Week 1): Pt will maintain arousal for 5 min  OT Short Term Goal 5 (Week 1): Pt will locate 4/4 grooming items on sink with MIN VC  Skilled Therapeutic Interventions/Progress Updates:  Session 1   OT treatment session focused on R side attention, initiation, and sit<>stand within functional ADL tasks. Pt greeted seated in wc, brought to the sink where pt initiated turning head to midline and looking at self in the mirror. Sit<>stand at the sink with increased time to initiate and manual facilitation for power up and anterior weight shift. Hand-over hand A to bring B UEs to sink for UE support.  Pt able to maintain standing balance with max A w/ lateral lean to L for ~ 3 mins while +2 assisted with pulling off pants and brief as pt was incontinent of bladder. Total A for peri care and donning new brief. Pt returned to sitting and worked on Occupational hygienist parts. Hand over hand A + total A to cross body to wash B LEs and UEs   Brought pt into figure 4 position and utilized backwards chaining to engaged pt in LB dressing. w/ max multimodal cues, pt helped pull up L pant leg. Then when OT placed R LE pant leg, pt initiated picking up R Leg to place into pants. Sit<>stand with total A  and total A to pull up pants. Stedy used to transfer pt to [facility] with Total A  To stand and  +2 assist for positioning and BSC management. Pt sat on commode for 10 mins with min/mod A for sitting balance, progressed to supervision by placing L UE onto R side of Stedy to promote midline posture- pt achieved midline and maintained with supervision for ~5 mins. Pt voided bladder successfully, with total A for peri-care and clothing management using Stedy w/ total A +2. Pt returned to wc, laugher and smiled at therapists for the first time. Pt positioned with TV and door on R side to promote R side attention. Safety belt on and needs met.    Session 2 Pt seated in wc, noted to be incontinent of bladder. Total A+2 transfer squat-pivot to return to bed. Rolling L and R with Max A to R and total A to L as pt with poor awareness of R side. Total A for clothing management and peri-care. Pt pointing to peri-care and became louder with perseverative humming sound- possibly trying to communicate need for toilet. Placed pt on bedpan and informed NT of pt status. Pt left on bedpan with needs met.   Therapy Documentation Precautions:  Precautions Precautions: Fall Restrictions Weight Bearing Restrictions: Yes Pain:    See Function Navigator for Current Functional Status.   Therapy/Group: Individual Therapy  Valma Cava 06/13/2017, 4:27 PM

## 2017-06-13 NOTE — Progress Notes (Signed)
Subjective/Complaints: No issues overnite per daughter , humming continuously ROS- cannot obtain due to MS Objective: Vital Signs: Blood pressure (!) 147/68, pulse 70, temperature 98.9 F (37.2 C), temperature source Axillary, resp. rate (!) 22, height 5\' 3"  (1.6 m), weight 67.6 kg (149 lb 0.5 oz), SpO2 100 %. No results found. No results found for this or any previous visit (from the past 72 hour(s)).   HEENT: normal Cardio: RRR and no murmur Resp: CTA B/L and unlabored GI: BS positive and NT, ND Extremity:  No Edema Skin:   Intact Neuro: awake, RIght neglect and left gaze preference, will sqeeze with BUE but poor release, clonus at bilateral ankles, absent Left patellar reflex, normal right patellar Musc/Skel:  Other no pain with UE or LE PROM, no jt effusions noted Gen NAD   Assessment/Plan: 1. 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 Physiatrist is providing close team supervision and 24 hour management of active medical problems listed below. Physiatrist and rehab team continue to assess barriers to discharge/monitor patient progress toward functional and medical goals. FIM: Function - Bathing Position: Shower Body parts bathed by helper: Right arm, Chest, Left arm, Abdomen, Front perineal area, Buttocks, Right upper leg, Left upper leg, Right lower leg, Left lower leg, Back Assist Level: 2 helpers  Function- Upper Body Dressing/Undressing What is the patient wearing?: Pull over shirt/dress Pull over shirt/dress - Perfomed by patient: Put head through opening Pull over shirt/dress - Perfomed by helper: Pull shirt over trunk, Thread/unthread left sleeve, Thread/unthread right sleeve Assist Level: Touching or steadying assistance(Pt > 75%) Function - Lower Body Dressing/Undressing What is the patient wearing?: Pants, Non-skid slipper  socks Position: Bed Pants- Performed by helper: Thread/unthread right pants leg, Thread/unthread left pants leg, Pull pants up/down Non-skid slipper socks- Performed by helper: Don/doff right sock, Don/doff left sock Socks - Performed by helper: Don/doff left sock, Don/doff right sock Assist for footwear: Dependant Assist for lower body dressing: 2 Helpers  Function - Toileting Toileting activity did not occur: No continent bowel/bladder event Toileting steps completed by helper: Adjust clothing prior to toileting, Performs perineal hygiene, Adjust clothing after toileting Assist level: Two helpers  Function - Archivist transfer activity did not occur: Safety/medical concerns Toilet transfer assistive device: Bedside commode Assist level to toilet: 2 helpers (per [employer] report) Assist level to bedside commode (at bedside): 2 helpers Assist level from bedside commode (at bedside): 2 helpers  Function - Chair/bed transfer Chair/bed transfer method: Stand pivot Chair/bed transfer assist level: dependent (Pt equals 0%) Chair/bed transfer assistive device: Mechanical lift Mechanical lift: Stedy Chair/bed transfer details: Manual facilitation for weight shifting, Manual facilitation for placement, Tactile cues for placement, Tactile cues for sequencing, Tactile cues for posture, Verbal cues for sequencing, Verbal cues for technique  Function - Locomotion: Wheelchair Type: Manual Max wheelchair distance: 42ft  Assist Level: Moderate assistance (Pt 50 - 74%) Wheel 50 feet with 2 turns activity did not occur: Refused Wheel 150 feet activity did not occur: Safety/medical concerns Function - Locomotion: Ambulation Assistive device: Parallel bars Max distance: 2 Assist level: Maximal assist (Pt 25 - 49%) Walk 10 feet activity did not occur: Safety/medical concerns Walk 50 feet with 2 turns activity did not occur: Safety/medical concerns Walk 150 feet activity did not  occur: Safety/medical concerns Walk 10 feet on uneven surfaces activity did not occur: Safety/medical concerns  Function - Comprehension Comprehension: Auditory  Comprehension assist level: Understands basic less than 25% of the time/ requires cueing >75% of the time  Function - Expression Expression: Nonverbal Expression assist level: Expresses basis less than 25% of the time/requires cueing >75% of the time.  Function - Social Interaction Social Interaction assist level: Interacts appropriately less than 25% of the time. May be withdrawn or combative.  Function - Problem Solving Problem solving assist level: Solves basic less than 25% of the time - needs direction nearly all the time or does not effectively solve problems and may need a restraint for safety  Function - Memory Memory assist level: Recognizes or recalls less than 25% of the time/requires cueing greater than 75% of the time Patient normally able to recall (first 3 days only): None of the above  Medical Problem List and Plan: 1. 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 DVT Prophylaxis/Anticoagulation: Eliquis 3. Pain Management: Tylenol 4. Mood: Prozac 40 mg daily, Remeron 15 mg daily at bedtime- both of which which will be d/ced due to somnolence, started on Ritalin with some increased alertness -per neuropsych neurologic, not mood issues determining behavior -family supportive as well 5. Neuropsych: This patient is capable of making decisions on herown behalf. 6. Skin/Wound Care: Routine skin checks 7. Fluids/Electrolytes/Nutrition: RoutineI&O's , increased BUN and Creat, IVF at noc May be in part due to Cozaar since the intake appears good per RN   8.PAF.Cardiac rate control. Continue Eliquis- monitor Hgb 9.Hypertension.Coreg 25 mg  BID,Cozaar 50 mg daily,verapamil 80 mg TID-  Vitals:   06/12/17 1506 06/13/17 0651  BP: (!) 167/83 (!) 147/68  Pulse: 69 70  Resp: 20 (!) 22  Temp: 98.8 F (37.1 C) 98.9 F (37.2 C)   10.Hyperlipidemia. Lipitor 11.  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  Bradycardia likely due to Coreg and Verapamil d/c verapamil start amlodipine    LOS (Days) 7 A FACE TO FACE EVALUATION WAS PERFORMED  Shreena Baines E 06/13/2017, 6:57 AM

## 2017-06-13 NOTE — Progress Notes (Signed)
Speech Language Pathology Daily Session Notes  Patient Details  Name: Christina [name] MRN: [id number] Date of Birth: 23-May-1946  Today's Date: 06/13/2017  Session 1: SLP Individual Time: [id number] SLP Individual Time Calculation (min): 30 min    Session 2: SLP Individual Time: 1330-1400 SLP Individual Time Calculation (min): 30 min  Short Term Goals: Week 1: SLP Short Term Goal 1 (Week 1): Pt will consume dysphagia 1 diet with necatr thick liquids by spoon without overt s/s of aspiration to demonstrate readiness for diet upgrade. SLP Short Term Goal 2 (Week 1): Pt will consume trials of thin liquids with decreased oral holding and timely swallow initiation to demonstrate readiness for diet uprgrade or instrumental study. SLP Short Term Goal 3 (Week 1): Pt will open eyes and maintain arousal for 10 seconds with Max A cues.  SLP Short Term Goal 4 (Week 1): Pt will name common objects with 50% accuracy and Max A cues.  SLP Short Term Goal 5 (Week 1): Pt will follow 1 step simple directions related to basic ADL tasks in 50% of opportunities with Max A cues.  SLP Short Term Goal 6 (Week 1): Pt will initiate tasks related to basic ADLs in 50% of opportunities with Max A cues.   Skilled Therapeutic Interventions:  Session 1: Skilled treatment session focused on speech and dysphagia goals. Upon arrival, patient was awake while upright in bed with daughter present. Patient's daughter reported that the patient does talk with family members and proceeded to call the patient's sister to demonstrate. Despite Max A multimodal cues from patient's daughter and sister, patient could only hum. SLP also facilitated session by administering trials of regular textures. Patient demonstrated mildly prolonged mastication with Min A verbal cues needed for swallow initiation and intermittent liquid washes to clear minimal oral residue. Recommend continued trials. Patient left upright in bed with daughter present.  Continue with current plan of care.   Session 2: Skilled treatment session focused on speech goals. SLP facilitated session by utilizing familiar music. Despite Max A multimodal cues, patient unable to vocalize (hum) or verbalize with melody of songs. However, patient did demonstrate sustained attention to music video in right field of environment for ~2 minutes. Patient left upright in wheelchair with all needs within reach. Continue with current plan of care.      Function:  Eating Eating   Modified Consistency Diet: Yes Eating Assist Level: Helper feeds patient       Helper Brings Food to Mouth: Every scoop   Cognition Comprehension Comprehension assist level: Understands basic less than 25% of the time/ requires cueing >75% of the time  Expression   Expression assist level: Expresses basis less than 25% of the time/requires cueing >75% of the time.  Social Interaction Social Interaction assist level: Interacts appropriately less than 25% of the time. May be withdrawn or combative.  Problem Solving Problem solving assist level: Solves basic less than 25% of the time - needs direction nearly all the time or does not effectively solve problems and may need a restraint for safety  Memory Memory assist level: Recognizes or recalls less than 25% of the time/requires cueing greater than 75% of the time    Pain Pain Assessment Pain Assessment: 0-10 Pain Score: 0-No pain Faces Pain Scale: No hurt  Therapy/Group: Individual Therapy  Carleena Mires 06/13/2017, 4:10 PM

## 2017-06-14 ENCOUNTER — Inpatient Hospital Stay (HOSPITAL_COMMUNITY): Payer: Medicare Other | Admitting: Speech Pathology

## 2017-06-14 ENCOUNTER — Inpatient Hospital Stay (HOSPITAL_COMMUNITY): Payer: Self-pay | Admitting: Occupational Therapy

## 2017-06-14 DIAGNOSIS — I69351 Hemiplegia and hemiparesis following cerebral infarction affecting right dominant side: Secondary | ICD-10-CM | POA: Diagnosis not present

## 2017-06-14 DIAGNOSIS — I639 Cerebral infarction, unspecified: Secondary | ICD-10-CM | POA: Diagnosis not present

## 2017-06-14 DIAGNOSIS — R4701 Aphasia: Secondary | ICD-10-CM | POA: Diagnosis not present

## 2017-06-14 DIAGNOSIS — R41841 Cognitive communication deficit: Secondary | ICD-10-CM | POA: Diagnosis not present

## 2017-06-14 DIAGNOSIS — I6340 Cerebral infarction due to embolism of unspecified cerebral artery: Secondary | ICD-10-CM | POA: Diagnosis not present

## 2017-06-14 NOTE — Progress Notes (Signed)
Subjective/Complaints: Slept ok, more talkative yesterday pm per daughter ROS- cannot obtain due to MS Objective: Vital Signs: Blood pressure (!) 169/92, pulse 69, temperature 97.8 F (36.6 C), temperature source Oral, resp. rate 18, height 5\' 3"  (1.6 m), weight 67.6 kg (149 lb 0.5 oz), SpO2 99 %. No results found. No results found for this or any previous visit (from the past 72 hour(s)).   HEENT: normal Cardio: RRR and no murmur Resp: CTA B/L and unlabored GI: BS positive and NT, ND Extremity:  No Edema Skin:   Intact Neuro: awake, RIght neglect and left gaze preference, will sqeeze with BUE but poor release, clonus at bilateral ankles, absent Left patellar reflex, normal right patellar Musc/Skel:  Other no pain with UE or LE PROM, no jt effusions noted Gen NAD   Assessment/Plan: 1. 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 Physiatrist is providing close team supervision and 24 hour management of active medical problems listed below. Physiatrist and rehab team continue to assess barriers to discharge/monitor patient progress toward functional and medical goals. FIM: Function - Bathing Position: Wheelchair/chair at sink Body parts bathed by patient: Right upper leg Body parts bathed by helper: Right arm, Left arm, Chest, Abdomen, Buttocks, Front perineal area, Left upper leg, Right lower leg, Left lower leg, Back Assist Level: 2 helpers  Function- Upper Body Dressing/Undressing What is the patient wearing?: Pull over shirt/dress Pull over shirt/dress - Perfomed by patient: Put head through opening Pull over shirt/dress - Perfomed by helper: Thread/unthread right sleeve, Put head through opening, Thread/unthread left sleeve, Pull shirt over trunk Assist Level: Touching or steadying assistance(Pt > 75%) Function - Lower Body  Dressing/Undressing What is the patient wearing?: Pants, Non-skid slipper socks Position: Wheelchair/chair at sink Pants- Performed by helper: Thread/unthread left pants leg, Thread/unthread right pants leg, Pull pants up/down Non-skid slipper socks- Performed by helper: Don/doff left sock, Don/doff right sock Socks - Performed by helper: Don/doff left sock, Don/doff right sock Assist for footwear: Dependant Assist for lower body dressing: 2 Helpers  Function - Toileting Toileting activity did not occur: No continent bowel/bladder event Toileting steps completed by helper: Adjust clothing prior to toileting, Performs perineal hygiene, Adjust clothing after toileting Assist level: Two helpers  Function - Archivist transfer activity did not occur: Safety/medical concerns Toilet transfer assistive device: Elevated toilet seat/BSC over toilet, Mechanical lift Mechanical lift: Stedy Assist level to toilet: 2 helpers (per [employer] report) Assist level to bedside commode (at bedside): 2 helpers Assist level from bedside commode (at bedside): 2 helpers  Function - Chair/bed transfer Chair/bed transfer method: Stand pivot Chair/bed transfer assist level: Total assist (Pt < 25%) Chair/bed transfer assistive device: Mechanical lift Mechanical lift: Stedy Chair/bed transfer details: Manual facilitation for weight bearing, Manual facilitation for placement, Manual facilitation for weight shifting  Function - Locomotion: Wheelchair Type: Manual Max wheelchair distance: 37ft  Assist Level: Moderate assistance (Pt 50 - 74%) Wheel 50 feet with 2 turns activity did not occur: Refused Wheel 150 feet activity did not occur: Safety/medical concerns Function - Locomotion: Ambulation Assistive device: Parallel bars Max distance: 2 Assist level: Maximal assist (Pt 25 - 49%) Walk 10 feet activity did not occur: Safety/medical concerns Walk 50 feet with 2 turns activity did not occur:  Safety/medical concerns Walk 150 feet activity did not occur: Safety/medical concerns Walk 10 feet on uneven surfaces activity did not occur: Safety/medical concerns  Function - Comprehension Comprehension: Auditory Comprehension assist level: Understands basic less than 25% of the time/ requires cueing >75% of the time  Function - Expression Expression: Nonverbal Expression assist level: Expresses basis less than 25% of the time/requires cueing >75% of the time.  Function - Social Interaction Social Interaction assist level: Interacts appropriately less than 25% of the time. May be withdrawn or combative.  Function - Problem Solving Problem solving assist level: Solves basic less than 25% of the time - needs direction nearly all the time or does not effectively solve problems and may need a restraint for safety  Function - Memory Memory assist level: Recognizes or recalls less than 25% of the time/requires cueing greater than 75% of the time Patient normally able to recall (first 3 days only): None of the above  Medical Problem List and Plan: 1. 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 DVT Prophylaxis/Anticoagulation: Eliquis 3. Pain Management: Tylenol 4. Mood: , started on Ritalin with some increased alertness -per neuropsych neurologic, not mood issues determining behavior -family supportive as well 5. Neuropsych: This patient is capable of making decisions on herown behalf. 6. Skin/Wound Care: Routine skin checks 7. Fluids/Electrolytes/Nutrition: RoutineI&O's , increased BUN and Creat, IVF at noc, recheck in am May be in part due to Cozaar since the intake appears good per RN   8.PAF.Cardiac rate control. Continue Eliquis- monitor Hgb 9.Hypertension.Coreg 25 mg BID,Cozaar 50 mg  daily,  Vitals:   06/13/17 1530 06/14/17 0520  BP: (!) 171/70 (!) 169/92  Pulse: 77 69  Resp: 20 18  Temp: 98.6 F (37 C) 97.8 F (36.6 C)   10.Hyperlipidemia. Lipitor 11.  Somnolence- this is in part abulia due to bifrontal infarcts  Ritalin trial talking more per daughter [id number].  Bradycardia likely due to Coreg and Verapamil d/c verapamil start amlodipine    LOS (Days) 8 A FACE TO FACE EVALUATION WAS PERFORMED  Christina [name] 06/14/2017, 7:20 AM

## 2017-06-14 NOTE — Progress Notes (Signed)
Occupational Therapy Session Note  Patient Details  Name: Christina [name] MRN: [id number] Date of Birth: July 12, 1946  Today's Date: 06/14/2017 OT Individual Time: 1583-0940 OT Individual Time Calculation (min): 55 min    Short Term Goals: Week 1:  OT Short Term Goal 1 (Week 1): Pt will maintain sitting balance with MIN A in prep for toileting OT Short Term Goal 2 (Week 1): Pt will squat pivot transfer with MAX A to w/c in prep for BSC transfer OT Short Term Goal 3 (Week 1): Pt will don pullover shirt with MAX A OT Short Term Goal 4 (Week 1): Pt will maintain arousal for 5 min  OT Short Term Goal 5 (Week 1): Pt will locate 4/4 grooming items on sink with MIN VC  Skilled Therapeutic Interventions/Progress Updates:    OT treatment session focused on postural control and R side attention. Pt greeted sitting in recliner with daughter present. Pt's daughter stated she already completed BADLs for pt this am. Sit<>stand in stedy with total A +2, once standing, OT provided max A and manual facilitation and hips and trunk to promote extension, while pt rested UE's on daughters shoulders to simulate pt preferred activity of dancing. Utilized music therapy to engage pt and to attend to R side- Pt maintained R gaze for ~ 5 mins while humming tune to [employer] PNF patterns with hand-over hand A in simulated dancing to favorite song.  Facilitated trunk elongation and anterior pelvic tilt while seated on mat. Pt returned to wc via stedy with total A +2 sit<>stand. Pt left tilted in TIS with daughter present, safety belt on, and needs met.   Therapy Documentation Precautions:  Precautions Precautions: Fall Restrictions Weight Bearing Restrictions: Yes Pain: Pain Assessment Pain Assessment: Faces Faces Pain Scale: No hurt  See Function Navigator for Current Functional Status.   Therapy/Group: Individual Therapy  Valma Cava 06/14/2017, 11:13 AM

## 2017-06-14 NOTE — Progress Notes (Signed)
Speech Language Pathology Weekly Progress and Session Note  Patient Details  Name: Christina [name] MRN: [id number] Date of Birth: 03-Aug-1946  Beginning of progress report period: June 06, 2017 End of progress report period: June 14, 2017  Today's Date: 06/14/2017 SLP Individual Time: 1330-1400 SLP Individual Time Calculation (min): 30 min  Short Term Goals: Week 1: SLP Short Term Goal 1 (Week 1): Pt will consume dysphagia 1 diet with necatr thick liquids by spoon without overt s/s of aspiration to demonstrate readiness for diet upgrade. SLP Short Term Goal 1 - Progress (Week 1): Met SLP Short Term Goal 2 (Week 1): Pt will consume trials of thin liquids with decreased oral holding and timely swallow initiation to demonstrate readiness for diet uprgrade or instrumental study. SLP Short Term Goal 2 - Progress (Week 1): Not met SLP Short Term Goal 3 (Week 1): Pt will open eyes and maintain arousal for 10 seconds with Max A cues.  SLP Short Term Goal 3 - Progress (Week 1): Met SLP Short Term Goal 4 (Week 1): Pt will name common objects with 50% accuracy and Max A cues.  SLP Short Term Goal 4 - Progress (Week 1): Not met SLP Short Term Goal 5 (Week 1): Pt will follow 1 step simple directions related to basic ADL tasks in 50% of opportunities with Max A cues.  SLP Short Term Goal 5 - Progress (Week 1): Not met SLP Short Term Goal 6 (Week 1): Pt will initiate tasks related to basic ADLs in 50% of opportunities with Max A cues.  SLP Short Term Goal 6 - Progress (Week 1): Not met    New Short Term Goals: Week 2: SLP Short Term Goal 1 (Week 2): Patient will consume trials of thin liquids with decreased oral holding and timely swallow initiation without overt s/s of aspiration to demonstrate readiness for diet uprgrade or instrumental study. SLP Short Term Goal 2 (Week 2): Patient will demonstrate efficient mastication and minimal oral resiude without overt s/s of aspiration with solid textures over 2  sessions prior to diet advancement.  SLP Short Term Goal 3 (Week 2): Patient will scan/attend to right field of enviornment during functional/structured tasks with Max A verbal cues for ~2 minutes.  SLP Short Term Goal 4 (Week 2): Patient will demonstrate sustained attention to a task for 2 minutes with Max A verbal cues.  SLP Short Term Goal 5 (Week 2): Patient will utilize gestures to answer basic yes/no questions in regards to wants/needs with Max A verbal cues in 75% of opportunities.  SLP Short Term Goal 6 (Week 2): Patient will verbalize at the word level with Max A multimodal cues in 10% of opportunities.   Weekly Progress Updates: Patient has made minimal progress and has met 2 of 6 STG's this reporting period. Currently, patient demonstrates increased ability to maintain alertness/arousal by keeping her eyes open for ~25 minutes. However, patient continues to require total A for all tasks due to severely impaired initiation, sustained attention and attention to left field of environment. Patient remains nonverbal and vocalizes (hums) nonpurposefuly throughout the session and requires total A to follow basic commands. Patient is tolerating her current diet of Dys. 1 textures with nectar-thick liquids via tsp without overt s/s of aspiration and is participating in trials of upgraded textures/solids with intermittent oral holding noted. Patient and family education is ongoing. However, suspect patient's family does not fully understand etiology/severity of deficits. Patient would benefit from continued skilled SLP intervention to maximize her cognitive-linguistic  and swallowing function prior to discharge.      Intensity: Minumum of 1-2 x/day, 30 to 90 minutes Frequency: 3 to 5 out of 7 days Duration/Length of Stay: TBD due to SNF placement  Treatment/Interventions: Cognitive remediation/compensation;Cueing hierarchy;Dysphagia/aspiration precaution training;Environmental controls;Functional  tasks;Internal/external aids;Multimodal communication approach;Patient/family education;Speech/Language facilitation;Therapeutic Activities   Daily Session  Skilled Therapeutic Interventions: Skilled treatment session focused on cognitive goals. SLP facilitated session by providing Max A multimodal cues for patient to perform rote, automatic verbal sequences without success. However, patient able to vocalize the correct intonation while clincian sang "Happy Birthday" throughout 50% of song. Patient also demonstrated functional use of her RUE and opened hand to receive item from clinician and place it into another location. Patient left upright in wheelchair with all needs within reach. Continue with current plan of care.       Function:   Cognition Comprehension Comprehension assist level: Understands basic less than 25% of the time/ requires cueing >75% of the time  Expression   Expression assist level: Expresses basis less than 25% of the time/requires cueing >75% of the time.  Social Interaction Social Interaction assist level: Interacts appropriately less than 25% of the time. May be withdrawn or combative.  Problem Solving Problem solving assist level: Solves basic less than 25% of the time - needs direction nearly all the time or does not effectively solve problems and may need a restraint for safety  Memory Memory assist level: Recognizes or recalls less than 25% of the time/requires cueing greater than 75% of the time   Pain No indications of pain   Therapy/Group: Individual Therapy  Aroura Vasudevan 06/14/2017, 3:46 PM

## 2017-06-15 ENCOUNTER — Inpatient Hospital Stay (HOSPITAL_COMMUNITY): Payer: Self-pay | Admitting: Occupational Therapy

## 2017-06-15 DIAGNOSIS — R4701 Aphasia: Secondary | ICD-10-CM | POA: Diagnosis not present

## 2017-06-15 DIAGNOSIS — I69351 Hemiplegia and hemiparesis following cerebral infarction affecting right dominant side: Secondary | ICD-10-CM | POA: Diagnosis not present

## 2017-06-15 DIAGNOSIS — R41841 Cognitive communication deficit: Secondary | ICD-10-CM | POA: Diagnosis not present

## 2017-06-15 DIAGNOSIS — I6340 Cerebral infarction due to embolism of unspecified cerebral artery: Secondary | ICD-10-CM | POA: Diagnosis not present

## 2017-06-15 DIAGNOSIS — I639 Cerebral infarction, unspecified: Secondary | ICD-10-CM | POA: Diagnosis not present

## 2017-06-15 LAB — BASIC METABOLIC PANEL
Anion gap: 6 (ref 5–15)
BUN: 22 mg/dL — AB (ref 6–20)
CHLORIDE: 106 mmol/L (ref 101–111)
CO2: 25 mmol/L (ref 22–32)
CREATININE: 1.03 mg/dL — AB (ref 0.44–1.00)
Calcium: 8.9 mg/dL (ref 8.9–10.3)
GFR calc Af Amer: >60 mL/min (ref >60)
GFR calc non Af Amer: 54 mL/min — ABNORMAL LOW (ref >60)
GLUCOSE: 110 mg/dL — AB (ref 65–99)
POTASSIUM: 3.9 mmol/L (ref 3.5–5.1)
SODIUM: 137 mmol/L (ref 135–145)

## 2017-06-15 MED ORDER — METHYLPHENIDATE HCL 5 MG PO TABS
10.0000 mg | ORAL_TABLET | Freq: Two times a day (BID) | ORAL | Status: AC
  Administered 2017-06-15 – 2017-06-19 (×10): 10 mg via ORAL
  Filled 2017-06-15 (×10): qty 2

## 2017-06-15 MED ORDER — AMLODIPINE BESYLATE 10 MG PO TABS
10.0000 mg | ORAL_TABLET | Freq: Every day | ORAL | Status: DC
  Administered 2017-06-15 – 2017-06-24 (×10): 10 mg via ORAL
  Filled 2017-06-15 (×10): qty 1

## 2017-06-15 NOTE — Progress Notes (Signed)
Subjective/Complaints:  ROS- cannot obtain due to MS Objective: Vital Signs: Blood pressure (!) 167/85, pulse 85, temperature 98.9 F (37.2 C), temperature source Oral, resp. rate 18, height 5' 3"  (1.6 m), weight 67.6 kg (149 lb 0.5 oz), SpO2 98 %. No results found. Results for orders placed or performed during the hospital encounter of 06/06/17 (from the past 72 hour(s))  Basic metabolic panel     Status: Abnormal   Collection Time: 06/15/17  4:33 AM  Result Value Ref Range   Sodium 137 135 - 145 mmol/L   Potassium 3.9 3.5 - 5.1 mmol/L   Chloride 106 101 - 111 mmol/L   CO2 25 22 - 32 mmol/L   Glucose, Bld 110 (H) 65 - 99 mg/dL   BUN 22 (H) 6 - 20 mg/dL   Creatinine, Ser 1.03 (H) 0.44 - 1.00 mg/dL   Calcium 8.9 8.9 - 10.3 mg/dL   GFR calc non Af Amer 54 (L) >60 mL/min   GFR calc Af Amer >60 >60 mL/min    Comment: (NOTE) The eGFR has been calculated using the CKD EPI equation. This calculation has not been validated in all clinical situations. eGFR's persistently <60 mL/min signify possible Chronic Kidney Disease.    Anion gap 6 5 - 15     HEENT: normal Cardio: RRR and no murmur Resp: CTA B/L and unlabored GI: BS positive and NT, ND Extremity:  No Edema Skin:   Intact Neuro: awake, RIght neglect and left gaze preference, will sqeeze with BUE but poor release, clonus at bilateral ankles, absent Left patellar reflex, normal right patellar Musc/Skel:  Other no pain with UE or LE PROM, no jt effusions noted Gen NAD   Assessment/Plan: 1. 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 Physiatrist is providing close team supervision and 24 hour management of active medical problems listed below. Physiatrist and rehab team continue to assess barriers to discharge/monitor patient progress toward functional and medical goals. FIM: Function -  Bathing Position: Wheelchair/chair at sink Body parts bathed by patient: Right upper leg Body parts bathed by helper: Right arm, Left arm, Chest, Abdomen, Buttocks, Front perineal area, Left upper leg, Right lower leg, Left lower leg, Back Assist Level: 2 helpers  Function- Upper Body Dressing/Undressing What is the patient wearing?: Pull over shirt/dress Pull over shirt/dress - Perfomed by patient: Put head through opening Pull over shirt/dress - Perfomed by helper: Thread/unthread right sleeve, Put head through opening, Thread/unthread left sleeve, Pull shirt over trunk Assist Level: Touching or steadying assistance(Pt > 75%) Function - Lower Body Dressing/Undressing What is the patient wearing?: Pants, Non-skid slipper socks Position: Wheelchair/chair at sink Pants- Performed by helper: Thread/unthread left pants leg, Thread/unthread right pants leg, Pull pants up/down Non-skid slipper socks- Performed by helper: Don/doff left sock, Don/doff right sock Socks - Performed by helper: Don/doff left sock, Don/doff right sock Assist for footwear: Dependant Assist for lower body dressing: 2 Helpers  Function - Toileting Toileting activity did not occur: No continent bowel/bladder event Toileting steps completed by helper: Adjust clothing prior to toileting, Performs perineal hygiene, Adjust clothing after toileting Assist level: Two helpers  Function - Air cabin crew transfer activity did not occur: Safety/medical concerns Toilet transfer assistive device: Elevated toilet seat/BSC over toilet, Mechanical lift Mechanical lift: Stedy Assist level to toilet: 2 helpers (per [employer] report) Assist level to bedside commode (at bedside): 2 helpers Assist level from  bedside commode (at bedside): 2 helpers  Function - Chair/bed transfer Chair/bed transfer method: Stand pivot Chair/bed transfer assist level: Total assist (Pt < 25%) Chair/bed transfer assistive device: Mechanical  lift Mechanical lift: Stedy Chair/bed transfer details: Manual facilitation for weight bearing, Manual facilitation for placement, Manual facilitation for weight shifting  Function - Locomotion: Wheelchair Type: Manual Max wheelchair distance: 75f  Assist Level: Moderate assistance (Pt 50 - 74%) Wheel 50 feet with 2 turns activity did not occur: Refused Wheel 150 feet activity did not occur: Safety/medical concerns Function - Locomotion: Ambulation Assistive device: Parallel bars Max distance: 2 Assist level: Maximal assist (Pt 25 - 49%) Walk 10 feet activity did not occur: Safety/medical concerns Walk 50 feet with 2 turns activity did not occur: Safety/medical concerns Walk 150 feet activity did not occur: Safety/medical concerns Walk 10 feet on uneven surfaces activity did not occur: Safety/medical concerns  Function - Comprehension Comprehension: Auditory Comprehension assist level: Understands basic less than 25% of the time/ requires cueing >75% of the time  Function - Expression Expression: Nonverbal Expression assist level: Expresses basis less than 25% of the time/requires cueing >75% of the time.  Function - Social Interaction Social Interaction assist level: Interacts appropriately less than 25% of the time. May be withdrawn or combative.  Function - Problem Solving Problem solving assist level: Solves basic less than 25% of the time - needs direction nearly all the time or does not effectively solve problems and may need a restraint for safety  Function - Memory Memory assist level: Recognizes or recalls less than 25% of the time/requires cueing greater than 75% of the time Patient normally able to recall (first 3 days only): None of the above  Medical Problem List and Plan: 1. Right-sided weakness with aphasiasecondary to Right  pericallosal frontal and left frontal operculum operculum infarct as well as recent left MCA infarct March 2018 -CIR PT, OT,  SLP 2. DVT Prophylaxis/Anticoagulation: Eliquis 3. Pain Management: Tylenol 4. Mood: Prozac 40 mg daily, Remeron 15 mg daily at bedtime- both of which which will be d/ced due to somnolence, started on Ritalin with some increased alertness -per neuropsych neurologic, not mood issues determining behavior -family supportive as well 5. Neuropsych: This patient is capable of making decisions on herown behalf. 6. Skin/Wound Care: Routine skin checks 7. Fluids/Electrolytes/Nutrition: RoutineI&O's , increased BUN and Creat, IVF at noc May be in part due to Cozaar since the intake appears good per RN   8.PAF.Cardiac rate control. Continue Eliquis- monitor Hgb 9.Hypertension.Coreg 25 mg BID,Cozaar 50 mg daily,amlodipine increased to 112mon 6/24  Vitals:   06/14/17 1419 06/15/17 0603  BP: (!) 160/77 (!) 167/85  Pulse: 70 85  Resp: (!) 22 18  Temp: 98.1 F (36.7 C) 98.9 F (37.2 C)   10.Hyperlipidemia. Lipitor 11.  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  Bradycardia improved off verapamil  LOS (Days) 9 A FACE TO FACE EVALUATION WAS PERFORMED  KIRSTEINS,ANDREW E 06/15/2017, 7:06 AM

## 2017-06-15 NOTE — Plan of Care (Signed)
Problem: RH BOWEL ELIMINATION Goal: RH STG MANAGE BOWEL WITH ASSISTANCE STG Manage Bowel with max Assistance.   Outcome: Not Progressing Disimpacted

## 2017-06-15 NOTE — Progress Notes (Signed)
Occupational Therapy Session Note  Patient Details  Name: Christina [name] MRN: [id number] Date of Birth: 12/18/1946  Today's Date: 06/15/2017 OT Individual Time: 1135-1206 OT Individual Time Calculation (min): 31 min    Short Term Goals: Week 1:  OT Short Term Goal 1 (Week 1): Pt will maintain sitting balance with MIN A in prep for toileting OT Short Term Goal 2 (Week 1): Pt will squat pivot transfer with MAX A to w/c in prep for BSC transfer OT Short Term Goal 3 (Week 1): Pt will don pullover shirt with MAX A OT Short Term Goal 4 (Week 1): Pt will maintain arousal for 5 min  OT Short Term Goal 5 (Week 1): Pt will locate 4/4 grooming items on sink with MIN VC     Skilled Therapeutic Interventions/Progress Updates:    Tx focus on alertness, attention, and following 1 step instruction during self care tasks.   Pt greeted supine in bed, humming, eyes closed. Provided sternal rub and cold wash cloth to face to increase alertness, which did not appear to help. HOH for hairbrushing initially, with pt brushing air without physical assist. Assist for attending to Lt side of head. Lotion application completed for bilateral integration and Lt attention. Pt able to apply lotion to dorsal aspect of Lt hand with vcs. At this time daughter arrived. Discussed PROM/AAROM for L UE contracture mgt with daughter instructed on method for extending elbow and wrist. Daughter verbalized that family will assist with this outside of tx and that she has been completing AAROM with pts legs as well. Pt left with daughter and all needs at time of departure.   Therapy Documentation Precautions:  Precautions Precautions: Fall Restrictions Weight Bearing Restrictions: No  Pain: No s/s of pain during tx    ADL:      See Function Navigator for Current Functional Status.   Therapy/Group: Individual Therapy  Heike Pounds A Devaris Quirk 06/15/2017, 3:06 PM

## 2017-06-15 NOTE — Plan of Care (Signed)
Problem: RH BOWEL ELIMINATION Goal: RH STG MANAGE BOWEL WITH ASSISTANCE STG Manage Bowel with max Assistance.   Outcome: Not Progressing Total care Goal: RH STG MANAGE BOWEL W/EQUIPMENT W/ASSISTANCE STG Manage Bowel With Equipment With max Assistance   Outcome: Progressing No bowel movement this shift  Problem: RH BLADDER ELIMINATION Goal: RH STG MANAGE BLADDER WITH ASSISTANCE STG Manage Bladder With max Assistance   Outcome: Not Progressing incontinent of urine, female condom applied Goal: RH STG MANAGE BLADDER WITH EQUIPMENT WITH ASSISTANCE STG Manage Bladder With Equipment With Assistance  Outcome: Progressing Female condom  Problem: RH SKIN INTEGRITY Goal: RH STG SKIN FREE OF INFECTION/BREAKDOWN Skin free of infection/breakdown with mod assist  Outcome: Progressing No skin issues noted Goal: RH STG MAINTAIN SKIN INTEGRITY WITH ASSISTANCE STG Maintain Skin Integrity With Assistance.  Outcome: Progressing Total assistance with skin care  Problem: RH SAFETY Goal: RH STG ADHERE TO SAFETY PRECAUTIONS W/ASSISTANCE/DEVICE STG Adhere to Safety Precautions With mod Assistance/Device.   Outcome: Progressing Safety precautions maintained  Problem: RH PAIN MANAGEMENT Goal: RH STG PAIN MANAGED AT OR BELOW PT'S PAIN GOAL <3  Outcome: Progressing Medicated once for pain this shift, resting in the bed with eyes closed

## 2017-06-16 ENCOUNTER — Inpatient Hospital Stay (HOSPITAL_COMMUNITY): Payer: Medicare Other | Admitting: Speech Pathology

## 2017-06-16 ENCOUNTER — Inpatient Hospital Stay (HOSPITAL_COMMUNITY): Payer: Self-pay | Admitting: Occupational Therapy

## 2017-06-16 ENCOUNTER — Inpatient Hospital Stay (HOSPITAL_COMMUNITY): Payer: Self-pay | Admitting: Physical Therapy

## 2017-06-16 DIAGNOSIS — I6340 Cerebral infarction due to embolism of unspecified cerebral artery: Secondary | ICD-10-CM | POA: Diagnosis not present

## 2017-06-16 DIAGNOSIS — R41841 Cognitive communication deficit: Secondary | ICD-10-CM | POA: Diagnosis not present

## 2017-06-16 DIAGNOSIS — I69351 Hemiplegia and hemiparesis following cerebral infarction affecting right dominant side: Secondary | ICD-10-CM | POA: Diagnosis not present

## 2017-06-16 DIAGNOSIS — I639 Cerebral infarction, unspecified: Secondary | ICD-10-CM | POA: Diagnosis not present

## 2017-06-16 DIAGNOSIS — R4701 Aphasia: Secondary | ICD-10-CM | POA: Diagnosis not present

## 2017-06-16 MED ORDER — SENNOSIDES-DOCUSATE SODIUM 8.6-50 MG PO TABS
2.0000 | ORAL_TABLET | Freq: Two times a day (BID) | ORAL | Status: DC
  Administered 2017-06-16 – 2017-06-24 (×13): 2 via ORAL
  Filled 2017-06-16 (×15): qty 2

## 2017-06-16 NOTE — Progress Notes (Signed)
Speech Language Pathology Daily Session Note  Patient Details  Name: Hardin Negusrinda Hardigree MRN: [id number] Date of Birth: 09-14-1946  Today's Date: 06/16/2017 SLP Individual Time: 1430-1455 SLP Individual Time Calculation (min): 25 min   Skilled Therapeutic Interventions: Skilled treatment session focused on cognitive goals. Upon arrival, patient sitting upright in wheelchair with left gaze preference appearing more prevalent this session. Patient unable to scan to midline or follow 1 step commands during structured or functional tasks despite hand over hand assist. Patient nonverbal with constant vocalizations (humming). Patient left upright in wheelchair with NT present. Continue with current plann of care.      Function:   Cognition Comprehension Comprehension assist level: Understands basic less than 25% of the time/ requires cueing >75% of the time  Expression   Expression assist level: Expresses basis less than 25% of the time/requires cueing >75% of the time.  Social Interaction Social Interaction assist level: Interacts appropriately less than 25% of the time. May be withdrawn or combative.  Problem Solving Problem solving assist level: Solves basic less than 25% of the time - needs direction nearly all the time or does not effectively solve problems and may need a restraint for safety  Memory Memory assist level: Recognizes or recalls less than 25% of the time/requires cueing greater than 75% of the time    Pain No/Denies Pain   Therapy/Group: Individual Therapy  [place] 06/16/2017, 3:23 PM

## 2017-06-16 NOTE — Progress Notes (Signed)
Occupational Therapy Weekly Progress Note  Patient Details  Name: Christina [name] MRN: [id number] Date of Birth: [birth year]  Beginning of progress report period: June 07, 2017 End of progress report period: June 16, 2017  Today's Date: 06/16/2017  Session 1 OT Individual Time: 0800-0900 OT Individual Time Calculation (min): 60 min   Session 2 OT Individual Time: 3559-7416 OT Individual Time Calculation (min): 43 min    Patient has met 1 of 5 short term goals.  Pt is progressing slowly towards OT goals at this time. Pt has improved arousal and is able to maintain arousal through 50-75% of of session with multimodal cues. Although pt is alert, she continues to have very poor attention and initiation affecting her ability to completed daily self-care tasks.  Patient continues to demonstrate the following deficits: muscle weakness, impaired timing and sequencing, abnormal tone, unbalanced muscle activation, motor apraxia, ataxia, decreased coordination and decreased motor planning, decreased midline orientation, decreased attention to right, right side neglect, decreased motor planning and ideational apraxia, decreased initiation, decreased attention, decreased awareness, decreased problem solving, decreased safety awareness, decreased memory and delayed processing and decreased sitting balance, decreased standing balance, decreased postural control, hemiplegia and decreased balance strategies and therefore will continue to benefit from skilled OT intervention to enhance overall performance with Reduce care partner burden.  Patient not progressing toward long term goals.  See goal revision..  Continue plan of care.  OT Short Term Goals Week 1:  OT Short Term Goal 1 (Week 1): Pt will maintain sitting balance with MIN A in prep for toileting OT Short Term Goal 1 - Progress (Week 1): Not met OT Short Term Goal 2 (Week 1): Pt will squat pivot transfer with MAX A to w/c in prep for BSC transfer OT  Short Term Goal 3 (Week 1): Pt will don pullover shirt with MAX A OT Short Term Goal 3 - Progress (Week 1): Not met OT Short Term Goal 4 (Week 1): Pt will maintain arousal for 5 min  OT Short Term Goal 4 - Progress (Week 1): Met OT Short Term Goal 5 (Week 1): Pt will locate 4/4 grooming items on sink with MIN VC OT Short Term Goal 5 - Progress (Week 1): Not met Week 2:  OT Short Term Goal 1 (Week 2): LTG=STG 2/2 ELOS  Skilled Therapeutic Interventions/Progress Updates:  Session 1   Pt laying supine in bed with head turned to L, and humming upon OT arrival. OT noted increased flexor tone in L LE and L UE. Provided therapeutic massage and gentle ROM to increase extension. Pt incontinent of bowel and bladder requiring total A for rolling, peri-care, and brief change.  Total A to don pants, then transferred pt to EOB with increased time and total A. Daughter entered and present for remainder of session. Addressed sitting balance, trunk extension, gentle neck ROM and R side attention. Utilized functional bathing task and hand over hand A to grasp wash cloth and integrate UE's into task. Pt able to turn head to R 2 x during session with max multimodal cues. Total A squat-pivot to wc. Positioned head/neck in neutral. Pt brought to sink in wc and left with daughter present to assist with oral care.   Session 2 Pt sitting in wc with fixed gaze to L. With max multimodal cues and gentle neck facilitation, pt brought head to midline to look at OT. Pt noted to be incontinent of bladder. Total A maximmove transfer back to bed for LB dressing and brief  change. Worked on R side attention and initiation with LB dressing task. Provided hand-over hand A to reach to pants and pull up over hips in sidelying, but required overall total A to maintain grasp and pull. Pt brought into sidelying on R and engaged pt in gentle L hand ROM. Pt with flexor tone throughout L UE, pt's palms red from flexed finger posture. Placed therapy  ball in L hand to protect skin. Total A maximove transfer back to wc. Placed towel support under neck to promote midline neck posture. Safety belt on, wc tilted, and needs met.   Therapy Documentation Precautions:  Precautions Precautions: Fall Restrictions Weight Bearing Restrictions: No Pain: Pain Assessment Pain Assessment: Faces Faces Pain Scale: Hurts even more Pain Type: Acute pain Pain Location: Leg Pain Orientation: Left Pain Onset:  (with ROM) Pain Intervention(s): Repositioned Other Treatments:    See Function Navigator for Current Functional Status.   Therapy/Group: Individual Therapy  Valma Cava 06/16/2017, 3:50 PM

## 2017-06-16 NOTE — Progress Notes (Signed)
Subjective/Complaints: Hums and makes repetitive sounds DIsimpacted per RN yesterday ROS- cannot obtain due to MS Objective: Vital Signs: Blood pressure (!) 178/74, pulse 79, temperature 97.6 F (36.4 C), temperature source Axillary, resp. rate 20, height 5' 3" (1.6 m), weight 67.6 kg (149 lb 0.5 oz), SpO2 94 %. No results found. Results for orders placed or performed during the hospital encounter of 06/06/17 (from the past 72 hour(s))  Basic metabolic panel     Status: Abnormal   Collection Time: 06/15/17  4:33 AM  Result Value Ref Range   Sodium 137 135 - 145 mmol/L   Potassium 3.9 3.5 - 5.1 mmol/L   Chloride 106 101 - 111 mmol/L   CO2 25 22 - 32 mmol/L   Glucose, Bld 110 (H) 65 - 99 mg/dL   BUN 22 (H) 6 - 20 mg/dL   Creatinine, Ser 1.03 (H) 0.44 - 1.00 mg/dL   Calcium 8.9 8.9 - 10.3 mg/dL   GFR calc non Af Amer 54 (L) >60 mL/min   GFR calc Af Amer >60 >60 mL/min    Comment: (NOTE) The eGFR has been calculated using the CKD EPI equation. This calculation has not been validated in all clinical situations. eGFR's persistently <60 mL/min signify possible Chronic Kidney Disease.    Anion gap 6 5 - 15     HEENT: normal Cardio: RRR and no murmur Resp: CTA B/L and unlabored GI: BS positive and NT, ND Extremity:  No Edema Skin:   Intact Neuro: awake, RIght neglect and left gaze preference, will sqeeze with BUE but poor release, clonus at bilateral ankles, absent Left patellar reflex, normal right patellar, left flexor withdrawl Musc/Skel:  Other no pain with UE or LE PROM, no jt effusions noted Gen NAD   Assessment/Plan: 1. 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 Physiatrist is providing close team supervision and 24 hour management of active medical problems listed below. Physiatrist and rehab team continue to assess barriers to  discharge/monitor patient progress toward functional and medical goals. FIM: Function - Bathing Position: Wheelchair/chair at sink Body parts bathed by patient: Right upper leg Body parts bathed by helper: Right arm, Left arm, Chest, Abdomen, Buttocks, Front perineal area, Left upper leg, Right lower leg, Left lower leg, Back Assist Level: 2 helpers  Function- Upper Body Dressing/Undressing What is the patient wearing?: Pull over shirt/dress Pull over shirt/dress - Perfomed by patient: Put head through opening Pull over shirt/dress - Perfomed by helper: Thread/unthread right sleeve, Put head through opening, Thread/unthread left sleeve, Pull shirt over trunk Assist Level: Touching or steadying assistance(Pt > 75%) Function - Lower Body Dressing/Undressing What is the patient wearing?: Pants, Non-skid slipper socks Position: Wheelchair/chair at sink Pants- Performed by helper: Thread/unthread left pants leg, Thread/unthread right pants leg, Pull pants up/down Non-skid slipper socks- Performed by helper: Don/doff left sock, Don/doff right sock Socks - Performed by helper: Don/doff left sock, Don/doff right sock Assist for footwear: Dependant Assist for lower body dressing: 2 Helpers  Function - Toileting Toileting activity did not occur: No continent bowel/bladder event Toileting steps completed by helper: Adjust clothing prior to toileting, Performs perineal hygiene, Adjust clothing after toileting Assist level: Two helpers  Function - Air cabin crew transfer activity did not occur: Safety/medical concerns Toilet transfer assistive device: Elevated toilet seat/BSC over toilet, Mechanical lift Mechanical lift: Stedy Assist level to toilet: 2 helpers (per [employer] report) Assist  level to bedside commode (at bedside): 2 helpers Assist level from bedside commode (at bedside): 2 helpers  Function - Chair/bed transfer Chair/bed transfer activity did not occur: Safety/medical  concerns Chair/bed transfer method: Stand pivot Chair/bed transfer assist level: Total assist (Pt < 25%) Chair/bed transfer assistive device: Mechanical lift Mechanical lift: Stedy Chair/bed transfer details: Manual facilitation for weight bearing, Manual facilitation for placement, Manual facilitation for weight shifting  Function - Locomotion: Wheelchair Type: Manual Max wheelchair distance: 28f  Assist Level: Moderate assistance (Pt 50 - 74%) Wheel 50 feet with 2 turns activity did not occur: Refused Wheel 150 feet activity did not occur: Safety/medical concerns Function - Locomotion: Ambulation Assistive device: Parallel bars Max distance: 2 Assist level: Maximal assist (Pt 25 - 49%) Walk 10 feet activity did not occur: Safety/medical concerns Walk 50 feet with 2 turns activity did not occur: Safety/medical concerns Walk 150 feet activity did not occur: Safety/medical concerns Walk 10 feet on uneven surfaces activity did not occur: Safety/medical concerns  Function - Comprehension Comprehension: Auditory Comprehension assist level: Understands basic less than 25% of the time/ requires cueing >75% of the time  Function - Expression Expression: Nonverbal Expression assist level: Expresses basis less than 25% of the time/requires cueing >75% of the time.  Function - Social Interaction Social Interaction assist level: Interacts appropriately less than 25% of the time. May be withdrawn or combative.  Function - Problem Solving Problem solving assist level: Solves basic less than 25% of the time - needs direction nearly all the time or does not effectively solve problems and may need a restraint for safety  Function - Memory Memory assist level: Recognizes or recalls less than 25% of the time/requires cueing greater than 75% of the time Patient normally able to recall (first 3 days only): None of the above  Medical Problem List and Plan: 1. 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 DVT Prophylaxis/Anticoagulation: Eliquis 3. Pain Management: Tylenol 4. Mood: Prozac 40 mg daily, Remeron 15 mg daily at bedtime- both of which which will be d/ced due to somnolence, started on Ritalin with some increased alertness -per neuropsych neurologic, not mood issues determining behavior -family supportive as well 5. Neuropsych: This patient is capable of making decisions on herown behalf. 6. Skin/Wound Care: Routine skin checks 7. Fluids/Electrolytes/Nutrition: RoutineI&O's , Normalized  BUN and Creat, IVF at noc May be in part due to Cozaar since the intake appears good per RN, on lower dose may tolerate higher dose if pt can keep up with fluid intake 8.PAF.Cardiac rate control. Continue Eliquis- monitor Hgb 9.Hypertension.Coreg 25 mg BID,Cozaar 50 mg daily,amlodipine increased to 148mon 6/24  Vitals:   06/15/17 1500 06/16/17 0552  BP: (!) 156/64 (!) 178/74  Pulse: 79 79  Resp: (!) 21 20  Temp: 98.4 F (36.9 C) 97.6 F (36.4 C)   10.Hyperlipidemia. Lipitor 11.  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  Bradycardia improved off verapamil  LOS (Days) 10 A FACE TO FACE EVALUATION WAS PERFORMED  , E 06/16/2017, 6:57 AM

## 2017-06-16 NOTE — Progress Notes (Signed)
Physical Therapy Weekly Progress Note  Patient Details  Name: Christina [name] MRN: [id number] Date of Birth: [birth year]  Beginning of progress report period: June 07, 2017 End of progress report period: June 16, 2017  Today's Date: 06/16/2017 PT Individual Time: 1000-1100 PT Individual Time Calculation (min): 60 min   Patient has met 0 of 4 short term goals.  Pt has made very little progress this week due to profound cognitive deficits and pt with perseverative humming and avoidance of gaze during therapy sessions.  Pt initially performing with max>total assist at eval, currently requiring consistent total>total +2 assist for all functional self care tasks and mobility.  Patient continues to demonstrate the following deficits muscle weakness, abnormal tone, unbalanced muscle activation, motor apraxia, decreased coordination and decreased motor planning, decreased midline orientation and right side neglect, decreased initiation, decreased attention, decreased awareness, decreased problem solving, decreased safety awareness, decreased memory and delayed processing and decreased sitting balance, decreased standing balance, decreased postural control, hemiplegia and decreased balance strategies and therefore will continue to benefit from skilled PT intervention to increase functional independence with mobility.  Patient not progressing toward long term goals.  See goal revision..  Plan of care revisions: remainder of LOS to focus on bed mobility, sitting balance, and localized responses. .  PT Short Term Goals Week 1:  PT Short Term Goal 1 (Week 1): Pt will perform bed mobility with mod assist  PT Short Term Goal 1 - Progress (Week 1): Not met PT Short Term Goal 2 (Week 1): Pt will maintain sitting balance with mod assist  PT Short Term Goal 2 - Progress (Week 1): Not met PT Short Term Goal 3 (Week 1): Pt will ambulate 33f with mod assist  PT Short Term Goal 3 - Progress (Week 1): Not met PT Short  Term Goal 4 (Week 1): Pt will maintain standing balance x 3 minutes with mod assist  PT Short Term Goal 4 - Progress (Week 1): Not met PT Short Term Goal 5 (Week 1): Pt will propell wc [id number] min assist from PT PT Short Term Goal 5 - Progress (Week 1): Not met Week 2:  PT Short Term Goal 1 (Week 2): =LTGs due to ELOS  Skilled Therapeutic Interventions/Progress Updates:    Pt in w/c in hallway with dtr at start of session.  Session focus on localized responses to a variety of stimuli in sitting and standing.  Pt transferred to tilt table with maximove and +2 assist.  Pt with decreased weight bearing through LLE today and withdraws to touch on L foot.  Does not demonstrate any other indication of pain.  Pt able to tolerate up to 50 degrees in tilt table over a period of 20 minutes with focus on weight bearing, upright posture, and response to voice, touch, and pain.  Pt responds to pain in <25% of observations and with strong L head turn throughout time on tilt table; responds to voice in 50% of opportunities, but only visually tracks therapist to follow voice in 25% of observations .  Transitioned back to w/c and taken to therapy gym.  Sit<>stand x2 with total +2 assist at side of // bars, pt does not initiate any part of transfer, maintains L head turn, and demonstrates no trunk control on L with L lateral trunk lean and no weight bearing on LLE.  Pt able to tolerate standing up to 1 minute with +2 assist to maintain trunk upright and BLE weight bearing.  Returned to w/c and  returned to room at end of session, call bell in reach and needs met.   Therapy Documentation Precautions:  Precautions Precautions: Fall Restrictions Weight Bearing Restrictions: No   See Function Navigator for Current Functional Status.  Therapy/Group: Individual Therapy  Christina [name] 06/16/2017, 12:18 PM

## 2017-06-16 NOTE — [place] FL2 (Signed)
[place] MEDICAID FL2 LEVEL OF CARE SCREENING TOOL     IDENTIFICATION  Patient Name: Christina [name] Birthdate: [birth year] Sex: female Admission Date (Current Location): 06/06/2017  [place] and [place] Number:  Haynes Bast [id number] S Facility and Address:  [facility], [address] [place], [place] [address]      Provider Number: [id number]  Attending Physician Name and Address:  Erick Colace, MD  Relative Name and Phone Number:  Overton Mam  [phone]    Current Level of Care: Other (Comment) (rehab) Recommended Level of Care: Skilled Nursing Facility Prior Approval Number:    Date Approved/Denied:   PASRR Number: [id number]  Discharge Plan: SNF    Current Diagnoses: Patient Active Problem List   Diagnosis Date Noted  . Broca's aphasia   . Cognitive communication disorder   . Cerebellar infarct (HCC) 06/06/2017  . 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 Uncontrolled hypertension 06/04/2017  . Fall at home 06/04/2017  . Altered mental status 06/04/2017  . History of CVA in adulthood 06/04/2017  . AF (paroxysmal atrial fibrillation) (HCC) 06/04/2017  . CKD (chronic kidney disease), stage III 06/04/2017  . Depression 06/04/2017  . Aphasia due to acute stroke (HCC)   . Encephalopathy, hypertensive 05/07/2017  . Enterococcus UTI 05/07/2017  . Hypertensive urgency 05/02/2017  . Hypertensive emergency 05/01/2017  . History of suicidal ideation   . Hypokalemia   . Suicide ideation   . Sleep disturbance   . PAF (paroxysmal atrial fibrillation) (HCC)   . Labile blood pressure   . AKI (acute kidney injury) (HCC)   . Stage 3 chronic kidney disease   . Acute blood loss anemia   . Benign essential HTN   . Adjustment disorder with mixed anxiety and depressed mood   . 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 Left hemiparesis (HCC)   . Primary osteoarthritis of left knee   . Left knee pain   . Diplopia    . Acute cystitis without hematuria   . Acute CVA (cerebrovascular accident) (HCC)   . TIA (transient ischemic attack) 03/14/2017  . History of CVA (cerebrovascular accident) 03/14/2017  . HTN (hypertension) 03/14/2017  . HLD (hyperlipidemia) 03/14/2017  . 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    Orientation RESPIRATION BLADDER Height & Weight     Self  Normal Incontinent Weight: 149 lb 0.5 oz (67.6 kg) Height:  5\' 3"  (160 cm)  BEHAVIORAL SYMPTOMS/MOOD NEUROLOGICAL BOWEL NUTRITION STATUS      Incontinent Diet (Dys 1 with nectar thick liquids)  AMBULATORY STATUS COMMUNICATION OF NEEDS Skin   Extensive Assist Does not communicate Normal                       Personal Care Assistance Level of Assistance  Bathing, Feeding, Dressing, Total care Bathing Assistance: Maximum assistance Feeding assistance: Limited assistance Dressing Assistance: Maximum assistance Total Care Assistance: Maximum assistance   Functional Limitations Info  Speech Sight Info: Adequate   Speech Info: Impaired    SPECIAL CARE FACTORS FREQUENCY  PT (By licensed PT), OT (By licensed OT), Bowel and bladder program, Speech therapy     PT Frequency: 5x week OT Frequency: 5x week Bowel and Bladder Program Frequency: Timed tolieting   Speech Therapy Frequency: 5 x week      Contractures Contractures Info: Not present    Additional Factors Info  Code Status Code Status Info: Full Code  Current Medications (06/16/2017):  This is the current hospital active medication list Current Facility-Administered Medications  Medication Dose Route Frequency Provider Last Rate Last Dose  . 0.45 % sodium chloride infusion   Intravenous Continuous Kirsteins, Victorino SparrowAndrew E, MD   Stopped at 06/16/17 [phone]  . acetaminophen (TYLENOL) tablet 650 mg  650 mg Oral Q6H PRN Charlton Amorngiulli, Daniel J, PA-C   650 mg at 06/15/17 1803   Or  . acetaminophen (TYLENOL) suppository 650 mg  650 mg Rectal Q6H PRN Angiulli,  Mcarthur Rossettianiel J, PA-C      . amLODipine (NORVASC) tablet 10 mg  10 mg Oral Daily Erick ColaceKirsteins, Andrew E, MD   10 mg at 06/16/17 0758  . apixaban (ELIQUIS) tablet 5 mg  5 mg Oral BID Charlton Amorngiulli, Daniel J, PA-C   5 mg at 06/16/17 [id number]  . atorvastatin (LIPITOR) tablet 20 mg  20 mg Oral q1800 Charlton Amorngiulli, Daniel J, PA-C   20 mg at 06/15/17 1716  . carvedilol (COREG) tablet 25 mg  25 mg Oral BID WC AngiulliMcarthur Rossetti, Daniel J, PA-C   25 mg at 06/16/17 0758  . losartan (COZAAR) tablet 50 mg  50 mg Oral Daily Kirsteins, Victorino SparrowAndrew E, MD   50 mg at 06/16/17 0758  . methylphenidate (RITALIN) tablet 10 mg  10 mg Oral BID WC Kirsteins, Victorino SparrowAndrew E, MD   10 mg at 06/16/17 1212  . ondansetron (ZOFRAN) tablet 4 mg  4 mg Oral Q6H PRN Angiulli, Mcarthur Rossettianiel J, PA-C       Or  . ondansetron [facility](ZOFRAN) injection 4 mg  4 mg Intravenous Q6H PRN Charlton AmorAngiulli, Daniel J, PA-C   4 mg at 06/13/17 2034  . senna-docusate (Senokot-S) tablet 2 tablet  2 tablet Oral BID Erick ColaceKirsteins, Andrew E, MD   2 tablet at 06/16/17 [phone]  . sorbitol 70 % solution 30 mL  30 mL Oral Daily PRN Angiulli, Mcarthur Rossettianiel J, PA-C      . traZODone (DESYREL) tablet 50 mg  50 mg Oral QHS PRN Erick ColaceKirsteins, Andrew E, MD   50 mg at 06/14/17 2119     Discharge Medications: Please see discharge summary for a list of discharge medications.  Relevant Imaging Results:  Relevant Lab Results:   Additional Information    Alexzandrea Normington, Lemar LivingsRebecca G, LCSW

## 2017-06-16 NOTE — Plan of Care (Signed)
Problem: RH Balance Goal: LTG Patient will maintain dynamic sitting balance (PT) LTG:  Patient will maintain dynamic sitting balance with assistance during mobility activities (PT)  Downgraded due to pt progress

## 2017-06-16 NOTE — Plan of Care (Signed)
Problem: RH Balance Goal: LTG: Patient will maintain dynamic sitting balance (OT) LTG:  Patient will maintain dynamic sitting balance with assistance during activities of daily living (OT)  Goal downgraded 6/25 ESD  Problem: RH Eating Goal: LTG Patient will perform eating w/assist, cues/equip (OT) LTG: Patient will perform eating with assist, with/without cues using equipment (OT)  Outcome: Not Applicable Date Met: 90/30/09 D/c goal 6/25 -ESD  Problem: RH Grooming Goal: LTG Patient will perform grooming w/assist,cues/equip (OT) LTG: Patient will perform grooming with assist, with/without cues using equipment (OT)  Goal downgraded 6/25 ESD  Problem: RH Toileting Goal: LTG Patient will perform toileting w/assist, cues/equip (OT) LTG: Patient will perform toiletiing (clothes management/hygiene) with assist, with/without cues using equipment (OT)  Outcome: Not Applicable Date Met: 23/30/07 D/c goal 6/25 ESD  Problem: RH Toilet Transfers Goal: LTG Patient will perform toilet transfers w/assist (OT) LTG: Patient will perform toilet transfers with assist, with/without cues using equipment (OT)  Outcome: Not Applicable Date Met: 62/26/33 D/c goal 6/25 ESD  Problem: RH Tub/Shower Transfers Goal: LTG Patient will perform tub/shower transfers w/assist (OT) LTG: Patient will perform tub/shower transfers with assist, with/without cues using equipment (OT)  Outcome: Not Applicable Date Met: 35/45/62 D/c goal 6/25 ESD  Problem: RH Memory Goal: LTG Patient will demonstrate ability for day to day (OT) LTG:  Patient will demonstrate ability for day to day recall/carryover during activities of daily living with assist  (OT)  Outcome: Not Applicable Date Met: 56/38/93 D/c goal 6/25 ESD  Problem: RH Attention Goal: LTG Patient will demonstrate focused/sustained (OT) LTG:  Patient will demonstrate focused/sustained/selective/alternating/divided attention during functional activities in specific  environment with assist for # of minutes  (OT)  Goal downgraded 6/25 ESD  Problem: RH Awareness Goal: LTG: Patient will demonstrate intellectual/emergent (OT) LTG: Patient will demonstrate intellectual/emergent/anticipatory awareness with assist during a functional activity  (OT)  Outcome: Not Applicable Date Met: 73/42/87 D/c goal 6/25 ESD

## 2017-06-17 ENCOUNTER — Inpatient Hospital Stay (HOSPITAL_COMMUNITY): Payer: Self-pay | Admitting: Physical Therapy

## 2017-06-17 ENCOUNTER — Inpatient Hospital Stay (HOSPITAL_COMMUNITY): Payer: Medicare Other | Admitting: Speech Pathology

## 2017-06-17 ENCOUNTER — Inpatient Hospital Stay (HOSPITAL_COMMUNITY): Payer: Self-pay | Admitting: Occupational Therapy

## 2017-06-17 DIAGNOSIS — I6340 Cerebral infarction due to embolism of unspecified cerebral artery: Secondary | ICD-10-CM | POA: Diagnosis not present

## 2017-06-17 DIAGNOSIS — R41841 Cognitive communication deficit: Secondary | ICD-10-CM | POA: Diagnosis not present

## 2017-06-17 DIAGNOSIS — I639 Cerebral infarction, unspecified: Secondary | ICD-10-CM | POA: Diagnosis not present

## 2017-06-17 DIAGNOSIS — R4701 Aphasia: Secondary | ICD-10-CM | POA: Diagnosis not present

## 2017-06-17 DIAGNOSIS — I69351 Hemiplegia and hemiparesis following cerebral infarction affecting right dominant side: Secondary | ICD-10-CM | POA: Diagnosis not present

## 2017-06-17 MED ORDER — TIZANIDINE HCL 2 MG PO TABS
2.0000 mg | ORAL_TABLET | Freq: Every day | ORAL | Status: DC
  Administered 2017-06-17 – 2017-06-23 (×7): 2 mg via ORAL
  Filled 2017-06-17 (×7): qty 1

## 2017-06-17 NOTE — Progress Notes (Signed)
Patient noted to have several new areas of skin irritation from constant scratching, picking at skin.  Despite attempts to redirect patient, using several different objects placed in hands, patient continues to rub/scratch/pick at skin.  Placed mittens on patient to protect her skin from further damage.  Dani Gobbleeardon, Jakeisha Stricker J, RN

## 2017-06-17 NOTE — Telephone Encounter (Signed)
PCP was out of clinic 6/18 & 6/19. Paperwork should be completed faxed before the end of the week.

## 2017-06-17 NOTE — Telephone Encounter (Signed)
Patient's daughter is calling to follow up on paperwork for home health. Paperwork was dropped off on 06/05/17. Please follow up

## 2017-06-17 NOTE — Progress Notes (Addendum)
Subjective/Complaints: Still with poor eye contact not following commands ROS- cannot obtain due to MS Objective: Vital Signs: Blood pressure (!) 145/74, pulse 77, temperature 98.4 F (36.9 C), temperature source Axillary, resp. rate 17, height 5' 3"  (1.6 m), weight 67.6 kg (149 lb 0.5 oz), SpO2 99 %. No results found. Results for orders placed or performed during the hospital encounter of 06/06/17 (from the past 72 hour(s))  Basic metabolic panel     Status: Abnormal   Collection Time: 06/15/17  4:33 AM  Result Value Ref Range   Sodium 137 135 - 145 mmol/L   Potassium 3.9 3.5 - 5.1 mmol/L   Chloride 106 101 - 111 mmol/L   CO2 25 22 - 32 mmol/L   Glucose, Bld 110 (H) 65 - 99 mg/dL   BUN 22 (H) 6 - 20 mg/dL   Creatinine, Ser 1.03 (H) 0.44 - 1.00 mg/dL   Calcium 8.9 8.9 - 10.3 mg/dL   GFR calc non Af Amer 54 (L) >60 mL/min   GFR calc Af Amer >60 >60 mL/min    Comment: (NOTE) The eGFR has been calculated using the CKD EPI equation. This calculation has not been validated in all clinical situations. eGFR's persistently <60 mL/min signify possible Chronic Kidney Disease.    Anion gap 6 5 - 15     HEENT: normal Cardio: RRR and no murmur Resp: CTA B/L and unlabored GI: BS positive and NT, ND Extremity:  No Edema Skin:   Intact Neuro: awake, RIght neglect and left gaze preference, will sqeeze with BUE but poor release, clonus at bilateral ankles, absent Left patellar reflex, normal right patellar, left flexor withdrawl Musc/Skel:  Other no pain with UE or LE PROM, no jt effusions noted Gen NAD   Assessment/Plan: 1. 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 Physiatrist is providing close team supervision and 24 hour management of active medical problems listed below. Physiatrist and rehab team continue to assess barriers to discharge/monitor  patient progress toward functional and medical goals. FIM: Function - Bathing Position: Wheelchair/chair at sink Body parts bathed by patient: Right upper leg Body parts bathed by helper: Right arm, Left arm, Chest, Abdomen, Buttocks, Front perineal area, Left upper leg, Right lower leg, Left lower leg, Back Assist Level: 2 helpers  Function- Upper Body Dressing/Undressing What is the patient wearing?: Pull over shirt/dress Pull over shirt/dress - Perfomed by patient: Put head through opening Pull over shirt/dress - Perfomed by helper: Thread/unthread right sleeve, Put head through opening, Thread/unthread left sleeve, Pull shirt over trunk Assist Level: Touching or steadying assistance(Pt > 75%) Function - Lower Body Dressing/Undressing What is the patient wearing?: Pants, Non-skid slipper socks Position: Wheelchair/chair at sink Pants- Performed by helper: Thread/unthread left pants leg, Thread/unthread right pants leg, Pull pants up/down Non-skid slipper socks- Performed by helper: Don/doff left sock, Don/doff right sock Socks - Performed by helper: Don/doff left sock, Don/doff right sock Assist for footwear: Dependant Assist for lower body dressing: 2 Helpers  Function - Toileting Toileting activity did not occur: No continent bowel/bladder event Toileting steps completed by helper: Adjust clothing prior to toileting, Performs perineal hygiene, Adjust clothing after toileting Assist level: Two helpers  Function - Air cabin crew transfer activity did not occur: Safety/medical concerns Toilet transfer assistive device: Elevated toilet seat/BSC over toilet, Mechanical lift Mechanical lift: Stedy Assist level to toilet: 2 helpers (per [employer] report) Assist level  to bedside commode (at bedside): 2 helpers Assist level from bedside commode (at bedside): 2 helpers  Function - Chair/bed transfer Chair/bed transfer activity did not occur: Safety/medical concerns Chair/bed  transfer method: Stand pivot Chair/bed transfer assist level: Total assist (Pt < 25%) Chair/bed transfer assistive device: Mechanical lift Mechanical lift: Stedy Chair/bed transfer details: Manual facilitation for weight bearing, Manual facilitation for placement, Manual facilitation for weight shifting  Function - Locomotion: Wheelchair Type: Manual Max wheelchair distance: 35f  Assist Level: Moderate assistance (Pt 50 - 74%) Wheel 50 feet with 2 turns activity did not occur: Refused Wheel 150 feet activity did not occur: Safety/medical concerns Function - Locomotion: Ambulation Assistive device: Parallel bars Max distance: 2 Assist level: Maximal assist (Pt 25 - 49%) Walk 10 feet activity did not occur: Safety/medical concerns Walk 50 feet with 2 turns activity did not occur: Safety/medical concerns Walk 150 feet activity did not occur: Safety/medical concerns Walk 10 feet on uneven surfaces activity did not occur: Safety/medical concerns  Function - Comprehension Comprehension: Auditory Comprehension assist level: Understands basic less than 25% of the time/ requires cueing >75% of the time  Function - Expression Expression: Nonverbal Expression assist level: Expresses basis less than 25% of the time/requires cueing >75% of the time.  Function - Social Interaction Social Interaction assist level: Interacts appropriately less than 25% of the time. May be withdrawn or combative.  Function - Problem Solving Problem solving assist level: Solves basic less than 25% of the time - needs direction nearly all the time or does not effectively solve problems and may need a restraint for safety  Function - Memory Memory assist level: Recognizes or recalls less than 25% of the time/requires cueing greater than 75% of the time Patient normally able to recall (first 3 days only): None of the above  Medical Problem List and Plan: 1.  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 DVT Prophylaxis/Anticoagulation: Eliquis 3. Pain Management: Tylenol 4. Mood: Prozac 40 mg daily, Remeron 15 mg daily at bedtime- both of which which will be d/ced due to somnolence, started on Ritalin with some increased alertness -per neuropsych neurologic, not mood issues determining behavior -family supportive as well, FL2 placed 5. Neuropsych: This patient is capable of making decisions on herown behalf. 6. Skin/Wound Care: Routine skin checks 7. Fluids/Electrolytes/Nutrition: RoutineI&O's , Normalized  BUN and Creat, IVF at noc May be in part due to Cozaar since the intake appears good per RN, on lower dose may tolerate higher dose if pt can keep up with fluid intake 8.PAF.Cardiac rate control. Continue Eliquis- monitor Hgb 9.Hypertension.Coreg 25 mg BID,Cozaar 50 mg daily,amlodipine increased to 115ZMon 60/80 systolic trending down  Vitals:   06/16/17 1424 06/17/17 0513  BP: (!) 154/81 (!) 145/74  Pulse: 71 77  Resp: 18 17  Temp: 98.9 F (37.2 C) 98.4 F (36.9 C)   10.Hyperlipidemia. Lipitor 11.  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  Bradycardia improved off verapamil  LOS (Days) 11 A FACE TO FACE EVALUATION WAS PERFORMED  KIRSTEINS,ANDREW E 06/17/2017, 6:59 AM

## 2017-06-17 NOTE — Progress Notes (Signed)
Social Work Patient ID: Hardin NegusArinda Demeo, female   DOB: [birth year], 71 y.o.   MRN: [id number] Spoke with [name]-daughter regarding needing a copy of pt;s medicare and Medicaid card if she has them. She expressed concerns About having Mom go to a NH and her becoming depressed and less engaging. Discussed she can be trained to take her home and would need to begin hoyer lift training and that [facility] would still take up to two  Weeks to get in place. Daughter voiced she needs to work and it is just her to provide care to pt. She wants 6-8 hours [facility] is only 3 hours and [facility] has a waiting list of 6-8 months for 8 hours of care. Daughter wanted to see if pt could Stay her longer informed her no, plan for discharge 6/29. Will se daughter in am and work on the best plan for pt.

## 2017-06-17 NOTE — Progress Notes (Signed)
Physical Therapy Session Note  Patient Details  Name: Christina [name] MRN: [id number] Date of Birth: 04-17-1946  Today's Date: 06/17/2017 PT Individual Time: 1000-1115 PT Individual Time Calculation (min): 75 min   Short Term Goals: Week 2:  PT Short Term Goal 1 (Week 2): =LTGs due to ELOS  Skilled Therapeutic Interventions/Progress Updates:    no indication of pain.  Pt continues with perseverative humming throughout entirety of session, but does demonstrate inconsistent focused attention, localized responses, and initiation today.  Pt requires 1 extended rest break due to mental fatigue during session.    Pt requires total assist for rolling L<>R for hygiene, donning clean brief, and lower body dressing.  Supine>sit with total assist and static sitting balance EOB with mod assist.  Pt requires +2 assist for squat pivot to w/c on R.  Pt taken to therapy gym with privacy screen around L side for improved midline attention during grasping/releasing cup task.  Pt requires increased time and max multimodal cues for initiation to grasp cup placed against her R hand, and hand over hand assist to stack cup in 12/12 trials.  Pt returned to room at end of session and positioned in tilt in space w/c with QRB in place, call bell in reach and needs met.   Therapy Documentation Precautions:  Precautions Precautions: Fall Restrictions Weight Bearing Restrictions: No   See Function Navigator for Current Functional Status.   Therapy/Group: Individual Therapy  Earnest Conroy Penven-Crew 06/17/2017, 12:05 PM

## 2017-06-17 NOTE — Plan of Care (Signed)
Problem: RH SKIN INTEGRITY Goal: RH STG SKIN FREE OF INFECTION/BREAKDOWN Skin free of infection/breakdown with mod assist  Outcome: Not Progressing Stage 1 to left ear   Problem: RH COGNITION-NURSING Goal: RH STG ANTICIPATES NEEDS/CALLS FOR ASSIST W/ASSIST/CUES STG Anticipates Needs/Calls for Assist With mod Assistance/Cues.   Outcome: Not Progressing Does not call

## 2017-06-17 NOTE — Progress Notes (Signed)
Speech Language Pathology Daily Session Note  Patient Details  Name: Christina [name] MRN: [id number] Date of Birth: [birth year]  Today's Date: 06/17/2017 SLP Individual Time: 0730-0800 SLP Individual Time Calculation (min): 30 min  Short Term Goals: Week 2: SLP Short Term Goal 1 (Week 2): Patient will consume trials of thin liquids with decreased oral holding and timely swallow initiation without overt s/s of aspiration to demonstrate readiness for diet uprgrade or instrumental study. SLP Short Term Goal 2 (Week 2): Patient will demonstrate efficient mastication and minimal oral resiude without overt s/s of aspiration with solid textures over 2 sessions prior to diet advancement.  SLP Short Term Goal 3 (Week 2): Patient will scan/attend to right field of enviornment during functional/structured tasks with Max A verbal cues for ~2 minutes.  SLP Short Term Goal 4 (Week 2): Patient will demonstrate sustained attention to a task for 2 minutes with Max A verbal cues.  SLP Short Term Goal 5 (Week 2): Patient will utilize gestures to answer basic yes/no questions in regards to wants/needs with Max A verbal cues in 75% of opportunities.  SLP Short Term Goal 6 (Week 2): Patient will verbalize at the word level with Max A multimodal cues in 10% of opportunities.   Skilled Therapeutic Interventions: Skilled treatment session focused on dysphagia and cognitive goals. SLP facilitated session by providing Max A verbal and visual cues to scan past midline to right field of environment during breakfast meal. Patient consumed breakfast meal of Dys. 1 textures with nectar-thick liquids via tsp with intermittent delayed swallow initiation and without overt s/s of aspiration. Patient remained nonverbal throughout session. Patient left upright in bed with NT present. Continue with current plan of care.      Function:  Eating Eating   Modified Consistency Diet: Yes Eating Assist Level: Helper feeds patient        Helper Brings Food to Mouth: Every scoop   Cognition Comprehension Comprehension assist level: Understands basic less than 25% of the time/ requires cueing >75% of the time  Expression   Expression assist level: Expresses basis less than 25% of the time/requires cueing >75% of the time.  Social Interaction Social Interaction assist level: Interacts appropriately less than 25% of the time. May be withdrawn or combative.  Problem Solving Problem solving assist level: Solves basic less than 25% of the time - needs direction nearly all the time or does not effectively solve problems and may need a restraint for safety  Memory Memory assist level: Recognizes or recalls less than 25% of the time/requires cueing greater than 75% of the time    Pain No indications of pain   Therapy/Group: Individual Therapy  Christina [name] 06/17/2017, 8:03 AM

## 2017-06-17 NOTE — Progress Notes (Signed)
Occupational Therapy Session Note  Patient Details  Name: Christina [name] MRN: [id number] Date of Birth: [birth year]  Today's Date: 06/17/2017 OT Individual Time: 1300-1429 OT Individual Time Calculation (min): 89 min    Short Term Goals: Week 2:  OT Short Term Goal 1 (Week 2): LTG=STG 2/2 ELOS  Skilled Therapeutic Interventions/Progress Updates:    Pt greeted in wc finishing lunch with nursing assistance. Pt brought to the sink and utilized mirror feedback and functional task to address R side attention, initiation, and functional use of B UEs. With multimodal cues and increased time, pt followed 1 step command to grasp brush on counter to the R. Pt began brushing the table and required hand-over hand A to bring R UE to head. Pt then began to brush hair, but perseverated on 1 spot of hair. OT placed brush in L UE, and brought L UE to L side of head. Pt began to brush L side. Total A to terminate task and release brus 2/2 tight grasp and motor perseverations. Pt incontinent of bowel and bladder. Maximove total A transfer to return to bed. Total A rolling R and L, but able to stay on R side better and pt pushing with L UE when rolling L. Brought pt into side-lying on R side and engaged pt in music therapy to look R. Pt maintained eye gaze in midline and to R for duration of 5 minute song. Pt hummed to the tune of pts favorite song " I will always love you" by [facility], then she sang the words "love you" from the song. Pt left in sidelying on R with pillows supported and needs met.   Therapy Documentation Precautions:  Precautions Precautions: Fall Restrictions Weight Bearing Restrictions: NoPain: Pain Assessment Pain Assessment: Faces Faces Pain Scale: No hurt  See Function Navigator for Current Functional Status.   Therapy/Group: Individual Therapy  Valma Cava 06/17/2017, 4:07 PM

## 2017-06-18 ENCOUNTER — Inpatient Hospital Stay (HOSPITAL_COMMUNITY): Payer: Medicare Other | Admitting: Occupational Therapy

## 2017-06-18 ENCOUNTER — Inpatient Hospital Stay (HOSPITAL_COMMUNITY): Payer: Self-pay | Admitting: Physical Therapy

## 2017-06-18 ENCOUNTER — Telehealth: Payer: Self-pay

## 2017-06-18 ENCOUNTER — Inpatient Hospital Stay (HOSPITAL_COMMUNITY): Payer: Medicare Other | Admitting: Speech Pathology

## 2017-06-18 DIAGNOSIS — R41841 Cognitive communication deficit: Secondary | ICD-10-CM | POA: Diagnosis not present

## 2017-06-18 DIAGNOSIS — I639 Cerebral infarction, unspecified: Secondary | ICD-10-CM | POA: Diagnosis not present

## 2017-06-18 DIAGNOSIS — R4701 Aphasia: Secondary | ICD-10-CM | POA: Diagnosis not present

## 2017-06-18 DIAGNOSIS — I6340 Cerebral infarction due to embolism of unspecified cerebral artery: Secondary | ICD-10-CM | POA: Diagnosis not present

## 2017-06-18 DIAGNOSIS — I69351 Hemiplegia and hemiparesis following cerebral infarction affecting right dominant side: Secondary | ICD-10-CM | POA: Diagnosis not present

## 2017-06-18 MED ORDER — DICLOFENAC SODIUM 1 % TD GEL
2.0000 g | Freq: Four times a day (QID) | TRANSDERMAL | Status: DC
  Administered 2017-06-18 – 2017-06-24 (×25): 2 g via TOPICAL
  Filled 2017-06-18: qty 100

## 2017-06-18 NOTE — Progress Notes (Signed)
Occupational Therapy Session Note  Patient Details  Name: Christina [name] MRN: [id number] Date of Birth: 03/06/1946  Today's Date: 06/18/2017 OT Individual Time: 1000-1055 and 1415- 1500 OT Individual Time Calculation (min): 55 min and 45 min    Short Term Goals: Week 1:  OT Short Term Goal 1 (Week 1): Pt will maintain sitting balance with MIN A in prep for toileting OT Short Term Goal 1 - Progress (Week 1): Not met OT Short Term Goal 2 (Week 1): Pt will squat pivot transfer with MAX A to w/c in prep for BSC transfer OT Short Term Goal 3 (Week 1): Pt will don pullover shirt with MAX A OT Short Term Goal 3 - Progress (Week 1): Not met OT Short Term Goal 4 (Week 1): Pt will maintain arousal for 5 min  OT Short Term Goal 4 - Progress (Week 1): Met OT Short Term Goal 5 (Week 1): Pt will locate 4/4 grooming items on sink with MIN VC OT Short Term Goal 5 - Progress (Week 1): Not met Week 2:  OT Short Term Goal 1 (Week 2): LTG=STG 2/2 ELOS  Skilled Therapeutic Interventions/Progress Updates:  Session 1: Upon entering the room, her caregiver present with several questions regarding discharge. OT discussing discharge recommendation of SNF with rehab secondary to pt needs and greater ability for therapeutic intervention. Caregiver stating she does not have any additional family support and is concerned regarding the amount of care her mother may need. OT provided therapeutic use of self during conversation. OT educating caregiver on multiple stroke risk, stroke symptoms, and therapeutic interventions with her verbalizing understanding. Pt remaining in bed and caregiver leaving at end of session with her stating she wishes to pursue SNF at this time. Bed alarm activated.   Session 2: Upon entering the room, pt seated in tilt in space wheelchair with B hands in mitten restraints. OT playing [place] music on IPAD and pt humming to tune of each song. Pt relaxing so that therapist was able to open B  hands, perform hand hygiene, and place foam bandage in palm as well as place washcloths in hands in order to protect skin integrity. OT placing IPAD on R with pt scanning to locate in order to see album covers displayed with mod cues. Pt returned to room with quick release belt donned and RN notified.   Therapy Documentation Precautions:  Precautions Precautions: Fall Restrictions Weight Bearing Restrictions: No Vital Signs: Therapy Vitals BP: 124/87  See Function Navigator for Current Functional Status.   Therapy/Group: Individual Therapy  ,  P 06/18/2017, 12:38 PM  

## 2017-06-18 NOTE — Telephone Encounter (Signed)
Message received from Arrie SenateMandesia Hairston, FNP noting that the patient's daughter is requesting PCS services.   This CM spoke to Dossie DerBecky Dupree, LCSW who noted that the patient's daughter is talking about taking her mother home but her mother is now total care and not able to communicate her needs. The patient needs 24 hour care and her daughter is planning to work and hopes to have an aide stay with the patient while she is working. Kriste BasqueBecky stated that she has explained to the daughter the limitations of PCS services and at this time it is not appropriate. Kriste BasqueBecky noted that she will complete a PCS referral at discharge if needed. She said that the patient does not have a 24 hour caregiver available. Someone would also need to manage the patient's medications. Kriste BasqueBecky said  that she will try to pursue SNF placement for the patient to best meet her needs.

## 2017-06-18 NOTE — Progress Notes (Signed)
Physical Therapy Session Note  Patient Details  Name: Christina [name] MRN: [id number] Date of Birth: May 15, 1946  Today's Date: 06/18/2017 PT Individual Time: 1100-1205 PT Individual Time Calculation (min): 65 min    Skilled Therapeutic Interventions/Progress Updates:    Attempted to initiate session at 0800, however, BP was: 189/95.  Nursing notified and issued meds.  Therapist checked back approx 20 min after meds issued and BP: 190/71.  Therapy held until 11 AM hour and BP was:  120/59.  Session initiated with pt lying in bed and focused on improving initiation, attention to task, core stability to assist in improving functional mobility to decrease burden of care.   Pt completed upper and lower body dressing with total assistance for rolling and donning/doffing shirt and pants.  Pt then performed supine to sit with total assist and bed to chair with total assist.  Remainder of session worked on performing reaching tasks for which pt required hand over hand assistance for reaching with facilitation given at scapular and on dorsum of hand to decrease palmar grasp reflex.  Pt also rolled ball on table with max assist to work on initiating trunk flexion for sit to stand.  Pt often perseverative t/o session with humming and different hand perseverations.  Following session, pt left up in room with call bell in reach, QRB donned, mitts in place and nursing notified of pt location.  Therapy Documentation Precautions:  Precautions Precautions: Fall Restrictions Weight Bearing Restrictions: No  See Function Navigator for Current Functional Status.   Therapy/Group: Individual Therapy  [name] [name] 06/18/2017, 12:21 PM

## 2017-06-18 NOTE — Progress Notes (Signed)
Speech Language Pathology Daily Session Note  Patient Details  Name: Christina [name] MRN: [id number] Date of Birth: [birth year]  Today's Date: 06/18/2017 SLP Individual Time: 1330-1400 SLP Individual Time Calculation (min): 30 min  Short Term Goals: Week 2: SLP Short Term Goal 1 (Week 2): Patient will consume trials of thin liquids with decreased oral holding and timely swallow initiation without overt s/s of aspiration to demonstrate readiness for diet uprgrade or instrumental study. SLP Short Term Goal 2 (Week 2): Patient will demonstrate efficient mastication and minimal oral resiude without overt s/s of aspiration with solid textures over 2 sessions prior to diet advancement.  SLP Short Term Goal 3 (Week 2): Patient will scan/attend to right field of enviornment during functional/structured tasks with Max A verbal cues for ~2 minutes.  SLP Short Term Goal 4 (Week 2): Patient will demonstrate sustained attention to a task for 2 minutes with Max A verbal cues.  SLP Short Term Goal 5 (Week 2): Patient will utilize gestures to answer basic yes/no questions in regards to wants/needs with Max A verbal cues in 75% of opportunities.  SLP Short Term Goal 6 (Week 2): Patient will verbalize at the word level with Max A multimodal cues in 10% of opportunities.   Skilled Therapeutic Interventions: Skilled treatment session focused on dysphagia and cognitive goals. SLP facilitated session by providing trials of Dys. 2 textures. Patient demonstrated mildly prolonged efficient mastication with complete oral clearance and Min-Mod A verbal and visual cues needed to initiate mastication. Recommend trial tray prior to upgrade. SLP also facilitated session by providing Mod A verbal cues for patient to attend to midline and to answer yes/no questions with utilization of gestures in 50% of opportunities. Patient left upright in chair with all needs within reach. Continue with current plan of care.       Function:  Eating Eating   Modified Consistency Diet: Yes Eating Assist Level: Helper feeds patient       Helper Brings Food to Mouth: Every scoop   Cognition Comprehension Comprehension assist level: Understands basic less than 25% of the time/ requires cueing >75% of the time  Expression   Expression assist level: Expresses basis less than 25% of the time/requires cueing >75% of the time.  Social Interaction Social Interaction assist level: Interacts appropriately less than 25% of the time. May be withdrawn or combative.  Problem Solving Problem solving assist level: Solves basic less than 25% of the time - needs direction nearly all the time or does not effectively solve problems and may need a restraint for safety  Memory Memory assist level: Recognizes or recalls less than 25% of the time/requires cueing greater than 75% of the time    Pain No indications of pain   Therapy/Group: Individual Therapy  Clelia Trabucco 06/18/2017, 3:41 PM

## 2017-06-18 NOTE — Patient Care Conference (Signed)
Inpatient RehabilitationTeam Conference and Plan of Care Update Date: 06/18/2017   Time: 11:25 AM    Patient Name: Christina [name]      Medical Record Number: [id number]  Date of Birth: Oct 28, 1946 Sex: Female         Room/Bed: 4M05C/4M05C-01 Payor Info: Payor: MEDICARE / Plan: MEDICARE PART B / Product Type: *No Product type* /    Admitting Diagnosis: L CVA  Admit Date/Time:  06/06/2017  6:21 PM Admission Comments: No comment available   Primary Diagnosis:  <principal problem not specified> Principal Problem: <principal problem not specified>  Patient Active Problem List   Diagnosis Date Noted  . Broca's aphasia   . Cognitive communication disorder   . Cerebellar infarct (HCC) 06/06/2017  . 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 Uncontrolled hypertension 06/04/2017  . Fall at home 06/04/2017  . Altered mental status 06/04/2017  . History of CVA in adulthood 06/04/2017  . AF (paroxysmal atrial fibrillation) (HCC) 06/04/2017  . CKD (chronic kidney disease), stage III 06/04/2017  . Depression 06/04/2017  . Aphasia due to acute stroke (HCC)   . Encephalopathy, hypertensive 05/07/2017  . Enterococcus UTI 05/07/2017  . Hypertensive urgency 05/02/2017  . Hypertensive emergency 05/01/2017  . History of suicidal ideation   . Hypokalemia   . Suicide ideation   . Sleep disturbance   . PAF (paroxysmal atrial fibrillation) (HCC)   . Labile blood pressure   . AKI (acute kidney injury) (HCC)   . Stage 3 chronic kidney disease   . Acute blood loss anemia   . Benign essential HTN   . Adjustment disorder with mixed anxiety and depressed mood   . 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 Left hemiparesis (HCC)   . Primary osteoarthritis of left knee   . Left knee pain   . Diplopia   . Acute cystitis without hematuria   . Acute CVA (cerebrovascular accident) (HCC)   . TIA (transient ischemic attack) 03/14/2017  . History of CVA (cerebrovascular accident) 03/14/2017   . HTN (hypertension) 03/14/2017  . HLD (hyperlipidemia) 03/14/2017  . 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    Expected Discharge Date: Expected Discharge Date: 06/20/17  Team Members Present: Physician leading conference: Dr. Claudette Laws Social Worker Present: Dossie Der, LCSW Nurse Present: Tennis Must, RN PT Present: Other (comment) Levon Hedger) OT Present: Kearney Hard, OT SLP Present: Feliberto Gottron, SLP PPS Coordinator present : Tora Duck, RN, CRRN     Current Status/Progress Goal Weekly Team Focus  Medical   poor intitiation cognition  maintain med stability  d/c planning   Bowel/Bladder   incontinent of bowel & bladder, LBM 06/17/17  less incontinent epidodes  toilet q 3 hrs when awake   Swallow/Nutrition/ Hydration   Dys. 1 textures with nectar-thick liquids via tsp, Max A  Min A  trials of upgraded liquids/solids    ADL's   Total A for all BADLs, maximove or total +2 for transfers  Goals downgraded to Max A overall   Initaition, R side attention, Apraxia, pt/family ed,    Mobility   total>total +2 for all mobility  downgraded to mod/max overall; focus on sitting balance, bed mobility, transfers, and focused attention to decrease burden of care  localized responses, focused attention, sitting balance, initiation, transfers   Communication   Total A, non-purposeful humming  Mod A  following commands, yes/no answers with gestures    Safety/Cognition/ Behavioral Observations  Total A  Mod A  arousa, attention, initiation  Pain   non-verbal, grimacs & yells out when turned, has tylenol prn & started on zanaflex yesterday  faces <2  continue to assess for non-verbal indicators of pain & treat as needed   Skin                *See Care Plan and progress notes for long and short-term goals.  Barriers to Discharge: cognitive issues    Possible Resolutions to Barriers:  SNF     Discharge Planning/Teaching Needs:  Daughter does not feel she can provide  the care pt needs [facility] Feels if MD writes order for care can get it which is not true. Daughter here daily and tries to engage pt.      Team Discussion:  Downgraded goals to max assist level. Iniaitation and right inattention issues. Order for resting hand splints at night.Daughter here and tries to assist, too much care for her daughter at this time. Pursuing NHP. Dys 1 nectar thick diet  Revisions to Treatment Plan:  NHP   Continued Need for Acute Rehabilitation Level of Care: The patient requires daily medical management by a physician with specialized training in physical medicine and rehabilitation for the following conditions: Daily direction of a multidisciplinary physical rehabilitation program to ensure safe treatment while eliciting the highest outcome that is of practical value to the patient.: Yes Daily medical management of patient stability for increased activity during participation in an intensive rehabilitation regime.: Yes Daily analysis of laboratory values and/or radiology reports with any subsequent need for medication adjustment of medical intervention for : Neurological problems  Denman Pichardo, Lemar LivingsRebecca G 06/19/2017, 12:24 PM

## 2017-06-18 NOTE — Progress Notes (Signed)
Subjective/Complaints: No issues overnite but have noted fever , no cough or resp distress noted Christina [name] briefly with daughter this am ROS- cannot obtain due to MS Objective: Vital Signs: Blood pressure (!) 154/51, pulse 73, temperature 97.9 F (36.6 C), temperature source Axillary, resp. rate 20, height 5\' 3"  (1.6 m), weight 67.6 kg (149 lb 0.5 oz), SpO2 100 %. No results found. No results found for this or any previous visit (from the past 72 hour(s)).   HEENT: normal Cardio: RRR and no murmur Resp: CTA B/L and unlabored GI: BS positive and NT, ND Extremity:  No Edema Skin:   Intact Neuro: awake, RIght neglect and left gaze preference, will sqeeze with BUE but poor release, clonus at bilateral ankles, absent Left patellar reflex, normal right patellar, left flexor withdrawl Musc/Skel:  Other no pain with UE or LE PROM, no jt effusions noted Gen NAD   Assessment/Plan: 1. 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 Physiatrist is providing close team supervision and 24 hour management of active medical problems listed below. Physiatrist and rehab team continue to assess barriers to discharge/monitor patient progress toward functional and medical goals. FIM: Function - Bathing Position: Wheelchair/chair at sink Body parts bathed by patient: Right upper leg Body parts bathed by helper: Right arm, Left arm, Chest, Abdomen, Buttocks, Front perineal area, Left upper leg, Right lower leg, Left lower leg, Back Assist Level: 2 helpers  Function- Upper Body Dressing/Undressing What is the patient wearing?: Pull over shirt/dress Pull over shirt/dress - Perfomed by patient: Put head through opening Pull over shirt/dress - Perfomed by helper: Thread/unthread right sleeve, Put head through opening, Thread/unthread left sleeve, Pull shirt over trunk Assist  Level: Touching or steadying assistance(Pt > 75%) Function - Lower Body Dressing/Undressing What is the patient wearing?: Pants Position: Bed Pants- Performed by helper: Thread/unthread left pants leg, Thread/unthread right pants leg, Pull pants up/down Non-skid slipper socks- Performed by helper: Don/doff left sock, Don/doff right sock Socks - Performed by helper: Don/doff left sock, Don/doff right sock Assist for footwear: Dependant Assist for lower body dressing: 2 Helpers  Function - Toileting Toileting activity did not occur: No continent bowel/bladder event Toileting steps completed by helper: Adjust clothing prior to toileting, Performs perineal hygiene, Adjust clothing after toileting Assist level: Two helpers  Function - ArchivistToilet Transfers Toilet transfer activity did not occur: Safety/medical concerns Toilet transfer assistive device: Elevated toilet seat/BSC over toilet, Mechanical lift Mechanical lift: Stedy Assist level to toilet: 2 helpers (per [employer],NT report) Assist level to bedside commode (at bedside): 2 helpers Assist level from bedside commode (at bedside): 2 helpers  Function - Chair/bed transfer Chair/bed transfer activity did not occur: Safety/medical concerns Chair/bed transfer method: Squat pivot Chair/bed transfer assist level: 2 helpers Chair/bed transfer assistive device: Mechanical lift Mechanical lift: Stedy Chair/bed transfer details: Manual facilitation for weight bearing, Manual facilitation for placement, Manual facilitation for weight shifting  Function - Locomotion: Wheelchair Type: Manual Max wheelchair distance: 4630ft  Assist Level: Moderate assistance (Pt 50 - 74%) Wheel 50 feet with 2 turns activity did not occur: Refused Wheel 150 feet activity did not occur: Safety/medical concerns Function - Locomotion: Ambulation Assistive device: Parallel bars Max distance: 2 Assist level: Maximal assist (Pt 25 - 49%) Walk 10 feet activity did not  occur: Safety/medical concerns Walk 50 feet with 2 turns activity did not occur: Safety/medical concerns Walk 150 feet activity did not  occur: Safety/medical concerns Walk 10 feet on uneven surfaces activity did not occur: Safety/medical concerns  Function - Comprehension Comprehension: Auditory Comprehension assist level: Understands basic less than 25% of the time/ requires cueing >75% of the time  Function - Expression Expression: Nonverbal Expression assist level: Expresses basis less than 25% of the time/requires cueing >75% of the time.  Function - Social Interaction Social Interaction assist level: Interacts appropriately less than 25% of the time. May be withdrawn or combative.  Function - Problem Solving Problem solving assist level: Solves basic less than 25% of the time - needs direction nearly all the time or does not effectively solve problems and may need a restraint for safety  Function - Memory Memory assist level: Recognizes or recalls less than 25% of the time/requires cueing greater than 75% of the time Patient normally able to recall (first 3 days only): None of the above  Medical Problem List and Plan: 1.  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 DVT Prophylaxis/Anticoagulation: Eliquis 3. Pain Management: Tylenol 4. Mood: Prozac 40 mg daily, Remeron 15 mg daily at bedtime- both of which which will be d/ced due to somnolence, started on Ritalin with some increased alertness -per neuropsych neurologic, not mood issues determining behavior -family supportive as well, FL2 placed 5. Neuropsych: This patient is capable of making decisions on herown behalf. 6. Skin/Wound Care: Routine skin checks 7.  Fluids/Electrolytes/Nutrition: RoutineI&O's , Normalized  BUN and Creat, IVF at noc May be in part due to Cozaar since the intake appears good per RN, on lower dose may tolerate higher dose if pt can keep up with fluid intake 8.PAF.Cardiac rate control. Continue Eliquis- monitor Hgb 9.Hypertension.Coreg 25 mg BID,Cozaar 50 mg daily,amlodipine increased to 10mg  on 6/24, systolic trending down  Vitals:   06/17/17 1900 06/18/17 0430  BP:  (!) 154/51  Pulse:  73  Resp:  20  Temp: (!) 101.5 F (38.6 C) 97.9 F (36.6 C)   10.Hyperlipidemia. Lipitor 11.  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  Bradycardia improved off verapamil  13.  Fever last noc, check UA C and S  LOS (Days) 12 A FACE TO FACE EVALUATION WAS PERFORMED  Azoria Abbett E 06/18/2017, 8:23 AM

## 2017-06-19 ENCOUNTER — Inpatient Hospital Stay (HOSPITAL_COMMUNITY): Payer: Self-pay | Admitting: Occupational Therapy

## 2017-06-19 ENCOUNTER — Inpatient Hospital Stay (HOSPITAL_COMMUNITY): Payer: Medicare Other | Admitting: Speech Pathology

## 2017-06-19 ENCOUNTER — Encounter (HOSPITAL_COMMUNITY): Payer: Self-pay | Admitting: Neurology

## 2017-06-19 ENCOUNTER — Inpatient Hospital Stay (HOSPITAL_COMMUNITY): Payer: Self-pay | Admitting: Physical Therapy

## 2017-06-19 DIAGNOSIS — I69351 Hemiplegia and hemiparesis following cerebral infarction affecting right dominant side: Secondary | ICD-10-CM | POA: Diagnosis not present

## 2017-06-19 DIAGNOSIS — I6340 Cerebral infarction due to embolism of unspecified cerebral artery: Secondary | ICD-10-CM | POA: Diagnosis not present

## 2017-06-19 DIAGNOSIS — R4701 Aphasia: Secondary | ICD-10-CM | POA: Diagnosis not present

## 2017-06-19 DIAGNOSIS — R41841 Cognitive communication deficit: Secondary | ICD-10-CM | POA: Diagnosis not present

## 2017-06-19 DIAGNOSIS — I639 Cerebral infarction, unspecified: Secondary | ICD-10-CM | POA: Diagnosis not present

## 2017-06-19 LAB — URINALYSIS, ROUTINE W REFLEX MICROSCOPIC
Bilirubin Urine: NEGATIVE
GLUCOSE, UA: NEGATIVE mg/dL
KETONES UR: NEGATIVE mg/dL
NITRITE: NEGATIVE
PH: 8.5 — AB (ref 5.0–8.0)
PROTEIN: 100 mg/dL — AB
Specific Gravity, Urine: 1.01 (ref 1.005–1.030)

## 2017-06-19 LAB — URINALYSIS, MICROSCOPIC (REFLEX): SQUAMOUS EPITHELIAL / LPF: NONE SEEN

## 2017-06-19 MED ORDER — LOSARTAN POTASSIUM 50 MG PO TABS
100.0000 mg | ORAL_TABLET | Freq: Every day | ORAL | Status: DC
  Administered 2017-06-19 – 2017-06-24 (×6): 100 mg via ORAL
  Filled 2017-06-19 (×7): qty 2

## 2017-06-19 NOTE — Progress Notes (Signed)
Speech Language Pathology Weekly Progress and Session Note  Patient Details  Name: Christina [name] MRN: [id number] Date of Birth: [birth year]  Beginning of progress report period: June 13, 2017 End of progress report period: June 19, 2017  Today's Date: 06/19/2017 SLP Individual Time: 1300-1355 SLP Individual Time Calculation (min): 55 min  Short Term Goals: Week 2: SLP Short Term Goal 1 (Week 2): Patient will consume trials of thin liquids with decreased oral holding and timely swallow initiation without overt s/s of aspiration to demonstrate readiness for diet uprgrade or instrumental study. SLP Short Term Goal 1 - Progress (Week 2): Not met SLP Short Term Goal 2 (Week 2): Patient will demonstrate efficient mastication and minimal oral resiude without overt s/s of aspiration with solid textures over 2 sessions prior to diet advancement.  SLP Short Term Goal 2 - Progress (Week 2): Met SLP Short Term Goal 3 (Week 2): Patient will scan/attend to right field of enviornment during functional/structured tasks with Max A verbal cues for ~2 minutes.  SLP Short Term Goal 3 - Progress (Week 2): Not met SLP Short Term Goal 4 (Week 2): Patient will demonstrate sustained attention to a task for 2 minutes with Max A verbal cues.  SLP Short Term Goal 5 (Week 2): Patient will utilize gestures to answer basic yes/no questions in regards to wants/needs with Max A verbal cues in 75% of opportunities.  SLP Short Term Goal 5 - Progress (Week 2): Met SLP Short Term Goal 6 (Week 2): Patient will verbalize at the word level with Max A multimodal cues in 10% of opportunities.  SLP Short Term Goal 6 - Progress (Week 2): Not met    New Short Term Goals: Week 3: SLP Short Term Goal 1 (Week 3): STGs=LTGs  Weekly Progress Updates: Patient has made minimal progress and has met 2 of 6 STG's this reporting period. Currently, patient demonstrates increased ability to maintain alertness/arousal by keeping her eyes open for  ~60 minute intervals. However, patient continues to require total A for all tasks due to severely impaired initiation, sustained attention and attention to left field of environment. Patient remains nonverbal and vocalizes (hums) nonpurposefuly throughout the session and requires total A to follow basic commands. Patient has been upgraded to Dys. 2 textures with nectar-thick liquids via tsp without overt s/s of aspiration and continues to demonstrate overt s/s of aspiration with trials of ice chips. Patient and family education is ongoing. Patient would benefit from continued skilled SLP intervention to maximize her cognitive-linguistic and swallowing function prior to discharge home with daughter.       Intensity: Minumum of 1-2 x/day, 30 to 90 minutes Frequency: 3 to 5 out of 7 days Duration/Length of Stay: 06/24/17 Treatment/Interventions: Cognitive remediation/compensation;Cueing hierarchy;Dysphagia/aspiration precaution training;Environmental controls;Functional tasks;Internal/external aids;Multimodal communication approach;Patient/family education;Speech/Language facilitation;Therapeutic Activities   Daily Session  Skilled Therapeutic Interventions: Skilled treatment session focused on dysphagia and cognitive goals. SLP facilitated session by providing skilled observation with upgraded meal of Dys. 2 textures with nectar-thick liquids via tsp. Patient consumed meal with mildly prolonged but efficient mastication with mild oral residue that cleared with liquid washes and without overt s/s of aspiration. Therefore, recommend patient upgrade to Dys. 2 textures. Patient's daughter present and educated in regards to patient's current swallowing function, diet recommendations and swallowing compensatory strategies. She verbalized understanding and is signed off to assist and provide supervision with meals. Patient required Max A verbal cues to attend to right field of environment during meal to locate  spoon and  self-feed ~4 sips of liquid via tsp with initial hand over hand assist that faded to Mod A verbal cues. Patient left upright in wheelchair with family present. Continue with current plan of care.       Function:   Eating Eating   Modified Consistency Diet: Yes Eating Assist Level: Helper feeds patient;Supervision or verbal cues       Helper Brings Food to Mouth: Every scoop   Cognition Comprehension Comprehension assist level: Understands basic less than 25% of the time/ requires cueing >75% of the time  Expression   Expression assist level: Expresses basis less than 25% of the time/requires cueing >75% of the time.  Social Interaction Social Interaction assist level: Interacts appropriately less than 25% of the time. May be withdrawn or combative.  Problem Solving Problem solving assist level: Solves basic less than 25% of the time - needs direction nearly all the time or does not effectively solve problems and may need a restraint for safety  Memory Memory assist level: Recognizes or recalls less than 25% of the time/requires cueing greater than 75% of the time   Pain Pain Assessment Pain Assessment: Faces Faces Pain Scale: No hurt  Therapy/Group: Individual Therapy  Jacinda Kanady 06/19/2017, 5:05 PM

## 2017-06-19 NOTE — Progress Notes (Addendum)
Social Work Patient ID: Christina [name], female   DOB: 1946/07/04, 71 y.o.   MRN: [id number]  I met with pt and her daughter to discuss plans. Daughter does want pt to go to a NH. She put her husband on the speaker phone And let this worker talk with him. Daughter and her husband have decided pt needs to come home and daughter will take a two month leave from work to provide care until she can get some assistance-PCS in place. Have given daughter a letter to take to work for leave of absence. Daughter is aware she will need to be here to learn hoyer lifts and be trained in pt's care. PT aware and will have [facility] come in for wheelchair evaluation. Will change the plan to going home. Daughter aware the resources will take time to get in place at home-atleast 2-3 weeks.

## 2017-06-19 NOTE — Plan of Care (Signed)
Problem: RH Dressing Goal: LTG Patient will perform lower body dressing w/assist (OT) LTG: Patient will perform lower body dressing with assist, with/without cues in positioning using equipment (OT)  Goal downgraded 6/28 - ESD

## 2017-06-19 NOTE — Progress Notes (Signed)
Physical Therapy Session Note  Patient Details  Name: Christina [name] MRN: [id number] Date of Birth: [birth year]  Today's Date: 06/19/2017 PT Individual Time: 1000-1100 PT Individual Time Calculation (min): 60 min   Short Term Goals: Week 2:  PT Short Term Goal 1 (Week 2): =LTGs due to ELOS  Skilled Therapeutic Interventions/Progress Updates:    no indication of pain.  Session focus on focused and sustained attention to functional/familiar tasks in quiet environment.  Pt noted to be incontinent of bowel and bladder upon arrival, requires +2 assist for hygiene, brief management, and lower body dressing at bed level.  Total assist for supine>sit and squat/pivot to w/c on L.  Pt engaged in functional familiar tasks in support sitting (brushing hair and putting pillow cases on pillows), with max fade to mod multimodal cues for attention to task.  Pt left tilted in chair with QRB in place, call bell in reach and needs met.   Therapy Documentation Precautions:  Precautions Precautions: Fall Restrictions Weight Bearing Restrictions: No   See Function Navigator for Current Functional Status.   Therapy/Group: Individual Therapy  Christina [name] 06/19/2017, 12:16 PM

## 2017-06-19 NOTE — Progress Notes (Signed)
Subjective/Complaints: Has remained afebrile except for one elevated temp 6/26 ROS- cannot obtain due to MS Objective: Vital Signs: Blood pressure (!) 166/59, pulse 76, temperature 98.8 F (37.1 C), temperature source Oral, resp. rate 19, height 5\' 3"  (1.6 m), weight 67.6 kg (149 lb 0.5 oz), SpO2 100 %. No results found. No results found for this or any previous visit (from the past 72 hour(s)).   HEENT: normal Cardio: RRR and no murmur Resp: CTA B/L and unlabored GI: BS positive and NT, ND Extremity:  No Edema Skin:   Intact Neuro: awake, RIght neglect and left gaze preference, will sqeeze with BUE but poor release, clonus at bilateral ankles, absent Left patellar reflex, normal right patellar, left flexor withdrawl Musc/Skel:  Other no pain with UE or LE PROM, no jt effusions noted Gen NAD   Assessment/Plan: 1. 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 Physiatrist is providing close team supervision and 24 hour management of active medical problems listed below. Physiatrist and rehab team continue to assess barriers to discharge/monitor patient progress toward functional and medical goals. FIM: Function - Bathing Position: Wheelchair/chair at sink Body parts bathed by patient: Right upper leg Body parts bathed by helper: Right arm, Left arm, Chest, Abdomen, Buttocks, Front perineal area, Left upper leg, Right lower leg, Left lower leg, Back Assist Level: 2 helpers  Function- Upper Body Dressing/Undressing What is the patient wearing?: Pull over shirt/dress Pull over shirt/dress - Perfomed by patient: Put head through opening Pull over shirt/dress - Perfomed by helper: Thread/unthread right sleeve, Put head through opening, Thread/unthread left sleeve, Pull shirt over trunk Assist Level: Touching or steadying assistance(Pt > 75%) Function  - Lower Body Dressing/Undressing What is the patient wearing?: Pants Position: Bed Pants- Performed by helper: Thread/unthread left pants leg, Thread/unthread right pants leg, Pull pants up/down Non-skid slipper socks- Performed by helper: Don/doff left sock, Don/doff right sock Socks - Performed by helper: Don/doff left sock, Don/doff right sock Assist for footwear: Dependant Assist for lower body dressing: 2 Helpers  Function - Toileting Toileting activity did not occur: No continent bowel/bladder event Toileting steps completed by helper: Adjust clothing prior to toileting, Performs perineal hygiene, Adjust clothing after toileting Assist level: Two helpers  Function - ArchivistToilet Transfers Toilet transfer activity did not occur: Safety/medical concerns Toilet transfer assistive device: Elevated toilet seat/BSC over toilet, Mechanical lift Mechanical lift: Stedy Assist level to toilet: 2 helpers (per [employer],NT report) Assist level to bedside commode (at bedside): 2 helpers Assist level from bedside commode (at bedside): 2 helpers  Function - Chair/bed transfer Chair/bed transfer activity did not occur: Safety/medical concerns Chair/bed transfer method: Squat pivot Chair/bed transfer assist level: 2 helpers Chair/bed transfer assistive device: Mechanical lift Mechanical lift: Stedy Chair/bed transfer details: Manual facilitation for weight shifting, Manual facilitation for placement, Manual facilitation for weight bearing, Tactile cues for initiation, Tactile cues for weight shifting, Tactile cues for sequencing  Function - Locomotion: Wheelchair Type: Manual Max wheelchair distance: 2730ft  Assist Level: Moderate assistance (Pt 50 - 74%) Wheel 50 feet with 2 turns activity did not occur: Refused Wheel 150 feet activity did not occur: Safety/medical concerns Function - Locomotion: Ambulation Assistive device: Parallel bars Max distance: 2 Assist level: Maximal assist (Pt 25 -  49%) Walk 10 feet activity did not occur: Safety/medical concerns Walk 50 feet with 2 turns activity did not occur: Safety/medical concerns Walk 150 feet activity  did not occur: Safety/medical concerns Walk 10 feet on uneven surfaces activity did not occur: Safety/medical concerns  Function - Comprehension Comprehension: Auditory Comprehension assist level: Understands basic less than 25% of the time/ requires cueing >75% of the time  Function - Expression Expression: Nonverbal Expression assist level: Expresses basis less than 25% of the time/requires cueing >75% of the time.  Function - Social Interaction Social Interaction assist level: Interacts appropriately less than 25% of the time. May be withdrawn or combative.  Function - Problem Solving Problem solving assist level: Solves basic less than 25% of the time - needs direction nearly all the time or does not effectively solve problems and may need a restraint for safety  Function - Memory Memory assist level: Recognizes or recalls less than 25% of the time/requires cueing greater than 75% of the time Patient normally able to recall (first 3 days only): None of the above  Medical Problem List and Plan: 1.  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 DVT Prophylaxis/Anticoagulation: Eliquis 3. Pain Management: Tylenol 4. Mood: Prozac 40 mg daily, Remeron 15 mg daily at bedtime- both of which which will be d/ced due to somnolence, started on Ritalin with some increased alertness -per neuropsych neurologic, not mood issues determining behavior -family supportive as well, FL2 placed 5. Neuropsych: This patient is capable of making decisions on herown  behalf. 6. Skin/Wound Care: Routine skin checks 7. Fluids/Electrolytes/Nutrition: RoutineI&O's , Normalized  BUN and Creat, d/c IVF at noc May be in part due to Cozaar since the intake appears good per RN, on lower dose may tolerate higher dose if pt can keep up with fluid intake 8.PAF.Cardiac rate control. Continue Eliquis- monitor Hgb 9.Hypertension.Coreg 25 mg BID,Cozaar 50 mg daily,amlodipine increased to 10mg  on 6/24, systolic trending down but am values still elevated increase cozaar back to 100mg   Vitals:   06/18/17 1414 06/19/17 0500  BP: (!) 141/70 (!) 166/59  Pulse: 86 76  Resp: 19 19  Temp: 98.4 F (36.9 C) 98.8 F (37.1 C)   10.Hyperlipidemia. Lipitor 11.  Somnolence- this is in part abulia due to bifrontal infarcts  Ritalin trial- No sig improvement will not continue after discharge 12.  Bradycardia improved off verapamil  13.  Feverx1  pnd UA   LOS (Days) 13 A FACE TO FACE EVALUATION WAS PERFORMED  Surya Schroeter E 06/19/2017, 7:02 AM

## 2017-06-19 NOTE — Progress Notes (Signed)
Occupational Therapy Session Note  Patient Details  Name: Christina [name] MRN: [id number] Date of Birth: 1945-12-24  Today's Date: 06/19/2017 OT Individual Time: 1415-1525 OT Individual Time Calculation (min): 70 min    Short Term Goals: Week 2:  OT Short Term Goal 1 (Week 2): LTG=STG 2/2 ELOS  Skilled Therapeutic Interventions/Progress Updates:    OT treatment session focused on pt/family education with pt's daughter Christina [name]. Educated [name] on Estate manager/land agentbody mechanics for caregiver safety when positioning pt and assisting with ADLs and transfers. Educated on resting hand splints and activities to promote R side attention. Discussed pressure relief positioning in bed and in TIS wc and [name] positioning pt. Reviewed hoyer lift operations and practiced first with sling placement using simulation with pillow. Christina [name] then practiced donning U-sling under her mother in wc, but had to remove and use doughnut sling 2/2 lift type. Pt's daughter able to position hoyer lift, attach appropriate loops, then transfer her mother back to bed with min cues. Discussed what home routine will look like including trying to incorporate timed toileting onto bed pan with meals. Pt incontinent of bladder, total A rolling, but then pt able to maintain grasp onto bed-rail on L and R side while peri-care and brief change completed total A. Pt's daughter is feeling confident in her decision to bring pt home.   Therapy Documentation Precautions:  Precautions Precautions: Fall Restrictions Weight Bearing Restrictions: No Pain: Pain Assessment Pain Assessment: Faces Faces Pain Scale: No hurt  See Function Navigator for Current Functional Status.   Therapy/Group: Individual Therapy  Mal Amabilelisabeth S Akari Crysler 06/19/2017, 3:34 PM

## 2017-06-19 NOTE — Progress Notes (Signed)
Social Work Patient ID: Christina [name], female   DOB: 06-Apr-1946, 71 y.o.   MRN: [id number] Sindy Messing Social Worker Signed Physical Medicine and Rehabilitation Patient Care Conference Date of Service: 06/18/2017  2:50 PM      Hide copied text Hover for attribution information Inpatient RehabilitationTeam Conference and Plan of Care Update Date: 06/18/2017   Time: 11:25 AM      Patient Name: Christina [name]      Medical Record Number: [id number]  Date of Birth: 1946-05-15 Sex: Female         Room/Bed: 4M05C/4M05C-01 Payor Info: Payor: MEDICARE / Plan: MEDICARE PART B / Product Type: *No Product type* /     Admitting Diagnosis: L CVA  Admit Date/Time:  06/06/2017  6:21 PM Admission Comments: No comment available    Primary Diagnosis:  <principal problem not specified> Principal Problem: <principal problem not specified>       Patient Active Problem List    Diagnosis Date Noted  . Broca's aphasia    . Cognitive communication disorder    . Cerebellar infarct (HCC) 06/06/2017  . 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 Uncontrolled hypertension 06/04/2017  . Fall at home 06/04/2017  . Altered mental status 06/04/2017  . History of CVA in adulthood 06/04/2017  . AF (paroxysmal atrial fibrillation) (HCC) 06/04/2017  . CKD (chronic kidney disease), stage III 06/04/2017  . Depression 06/04/2017  . Aphasia due to acute stroke (HCC)    . Encephalopathy, hypertensive 05/07/2017  . Enterococcus UTI 05/07/2017  . Hypertensive urgency 05/02/2017  . Hypertensive emergency 05/01/2017  . History of suicidal ideation    . Hypokalemia    . Suicide ideation    . Sleep disturbance    . PAF (paroxysmal atrial fibrillation) (HCC)    . Labile blood pressure    . AKI (acute kidney injury) (HCC)    . Stage 3 chronic kidney disease    . Acute blood loss anemia    . Benign essential HTN    . Adjustment disorder with mixed anxiety and depressed mood    . Embolic  infarction (HCC) 03/19/2017  . Left hemiparesis (HCC)    . Primary osteoarthritis of left knee    . Left knee pain    . Diplopia    . Acute cystitis without hematuria    . Acute CVA (cerebrovascular accident) (HCC)    . TIA (transient ischemic attack) 03/14/2017  . History of CVA (cerebrovascular accident) 03/14/2017  . HTN (hypertension) 03/14/2017  . HLD (hyperlipidemia) 03/14/2017  . 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 Claudette Laws Social Worker Present: Dossie Der, LCSW Nurse Present: Tennis Must, RN PT Present: Other (comment) Levon Hedger) OT Present: Kearney Hard, OT SLP Present: Feliberto Gottron, SLP PPS Coordinator present : Tora Duck, RN, CRRN       Current Status/Progress Goal Weekly Team Focus  Medical     poor intitiation cognition  maintain med stability  d/c planning   Bowel/Bladder     incontinent of bowel & bladder, LBM 06/17/17  less incontinent epidodes  toilet q 3 hrs when awake   Swallow/Nutrition/ Hydration     Dys. 1 textures with nectar-thick liquids via tsp, Max A  Min A  trials of upgraded liquids/solids    ADL's     Total A for all BADLs, maximove or total +  2 for transfers  Goals downgraded to Max A overall   Initaition, R side attention, Apraxia, pt/family ed,    Mobility     total>total +2 for all mobility  downgraded to mod/max overall; focus on sitting balance, bed mobility, transfers, and focused attention to decrease burden of care  localized responses, focused attention, sitting balance, initiation, transfers   Communication     Total A, non-purposeful humming  Mod A  following commands, yes/no answers with gestures    Safety/Cognition/ Behavioral Observations   Total A  Mod A  arousa, attention, initiation   Pain     non-verbal, grimacs & yells out when turned, has tylenol prn & started on zanaflex  yesterday  faces <2  continue to assess for non-verbal indicators of pain & treat as needed   Skin                 *See Care Plan and progress notes for long and short-term goals.   Barriers to Discharge: cognitive issues     Possible Resolutions to Barriers:  SNF      Discharge Planning/Teaching Needs:  Daughter does not feel she can provide the care pt needs and is now looking at [facility] Feels if MD writes order for care can get it which is not true. Daughter here daily and tries to engage pt.      Team Discussion:  Downgraded goals to max assist level. Iniaitation and right inattention issues. Order for resting hand splints at night.Daughter here and tries to assist, too much care for her daughter at this time. Pursuing NHP. Dys 1 nectar thick diet  Revisions to Treatment Plan:  NHP    Continued Need for Acute Rehabilitation Level of Care: The patient requires daily medical management by a physician with specialized training in physical medicine and rehabilitation for the following conditions: Daily direction of a multidisciplinary physical rehabilitation program to ensure safe treatment while eliciting the highest outcome that is of practical value to the patient.: Yes Daily medical management of patient stability for increased activity during participation in an intensive rehabilitation regime.: Yes Daily analysis of laboratory values and/or radiology reports with any subsequent need for medication adjustment of medical intervention for : Neurological problems   Pascual Mantel, Lemar Livings 06/19/2017, 12:24 PM      Kimiya Brunelle, Lemar Livings, LCSW Social Worker Signed Physical Medicine and Rehabilitation  Patient Care Conference Date of Service: 06/11/2017 12:50 PM      Hide copied text Hover for attribution information Inpatient RehabilitationTeam Conference and Plan of Care Update Date: 06/11/2017   Time: 10:30 AM      Patient Name: Christina [name]      Medical Record Number: [id number]  Date of  Birth: 1946/07/01 Sex: Female         Room/Bed: 4M05C/4M05C-01 Payor Info: Payor: MEDICARE / Plan: MEDICARE PART B / Product Type: *No Product type* /     Admitting Diagnosis: L CVA  Admit Date/Time:  06/06/2017  6:21 PM Admission Comments: No comment available    Primary Diagnosis:  <principal problem not specified> Principal Problem: <principal problem not specified>       Patient Active Problem List    Diagnosis Date Noted  . Broca's aphasia    . Cognitive communication disorder    . Cerebellar infarct (HCC) 06/06/2017  . 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 Uncontrolled hypertension 06/04/2017  . Fall at home 06/04/2017  . Altered mental status 06/04/2017  .  History of CVA in adulthood 06/04/2017  . AF (paroxysmal atrial fibrillation) (HCC) 06/04/2017  . CKD (chronic kidney disease), stage III 06/04/2017  . Depression 06/04/2017  . Aphasia due to acute stroke (HCC)    . Encephalopathy, hypertensive 05/07/2017  . Enterococcus UTI 05/07/2017  . Hypertensive urgency 05/02/2017  . Hypertensive emergency 05/01/2017  . History of suicidal ideation    . Hypokalemia    . Suicide ideation    . Sleep disturbance    . PAF (paroxysmal atrial fibrillation) (HCC)    . Labile blood pressure    . AKI (acute kidney injury) (HCC)    . Stage 3 chronic kidney disease    . Acute blood loss anemia    . Benign essential HTN    . Adjustment disorder with mixed anxiety and depressed mood    . 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 Left hemiparesis (HCC)    . Primary osteoarthritis of left knee    . Left knee pain    . Diplopia    . Acute cystitis without hematuria    . Acute CVA (cerebrovascular accident) (HCC)    . TIA (transient ischemic attack) 03/14/2017  . History of CVA (cerebrovascular accident) 03/14/2017  . HTN (hypertension) 03/14/2017  . HLD (hyperlipidemia) 03/14/2017  . 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 Claudette LawsAndrew Kirsteins Social Worker Present: Dossie DerBecky Saxon Crosby, LCSW Nurse Present: Other (comment) Westley Gambles(Melaine Bridges-RN) PT Present: Teodoro Kilaitlin Penven-Crew, PT OT Present: Kearney HardElisabeth Doe, OT SLP Present: Feliberto Gottronourtney Payne, SLP PPS Coordinator present : Tora DuckMarie Noel, RN, CRRN       Current Status/Progress Goal Weekly Team Focus  Medical     Decreased cognition, poor initiation, aphasia,  Maintain medical stability, manage elevated blood pressures  Neuropsychological evaluation to examine behavioral component   Bowel/Bladder     incontinent of B/B. LBM 06/09/2017  one or less incont episodes q shift  toilet q3hr when awake   Swallow/Nutrition/ Hydration     Dys. 1 textures with nectar-thick liquids via tsp, Max A  Min A  tolerance of current diet, trials of upgraded liquids   ADL's     Total A overall  min A overall  attentio, initiation, awareness, bath/dressing, R sidr attentio, apraxia   Mobility     total for all mobility  downgraded to mod assist  cognitive remediation, functional mobility as able    Communication     Total A, non-purposeful humming   Mod A  following commands, yes/no answers with gestures   Safety/Cognition/ Behavioral Observations   Total A  Mod A  arousal, attention, initiation    Pain     Faces=0; Faces=6 when R buttock touched  Faces=2 or less  assess for pain q shift; turn q 2hr; pink foam to buttocks   Skin     buttocks pink; open blister to R buttock (foam); ecchymosis to nose and chin; abrasions to face  no new skin breakdown; free from infection  assess skin integrity q shift and prn     *See Care Plan and progress notes for long and short-term goals.   Barriers to Discharge: Blood pressure elevation, heavy physical assistance, severe cognitive deficits     Possible Resolutions to Barriers:  Continue rehabilitation program, continue medication management     Discharge  Planning/Teaching Needs:  Daughter would like to take her home, is working on assistance for her while  she works. Question if daughter will be able to manage pt at home will require 24 hr physical care this time      Team Discussion:  Goals downgraded to max assist level due to lack of progress and difficulty with initiation and ability to participate. Arousal poor and usually closes eyes during session. Follows one step commands at best. MD trial of ritalin see if helps. Neuro-psych saw and all neurological no behavioral component. Pt will require 24 hr physical care.   Revisions to Treatment Plan:  Downgraded goals-discharge disposition home versus NHP    Continued Need for Acute Rehabilitation Level of Care: The patient requires daily medical management by a physician with specialized training in physical medicine and rehabilitation for the following conditions: Daily direction of a multidisciplinary physical rehabilitation program to ensure safe treatment while eliciting the highest outcome that is of practical value to the patient.: Yes Daily medical management of patient stability for increased activity during participation in an intensive rehabilitation regime.: Yes Daily analysis of laboratory values and/or radiology reports with any subsequent need for medication adjustment of medical intervention for : Neurological problems;Blood pressure problems   Alice Vitelli, Lemar Livings 06/11/2017, 12:50 PM

## 2017-06-19 NOTE — Progress Notes (Signed)
Speech Language Pathology Daily Session Note  Patient Details  Name: Christina [name] MRN: [id number] Date of Birth: January 21, 1946  Today's Date: 06/19/2017 SLP Individual Time: 1100-1130 SLP Individual Time Calculation (min): 30 min  Short Term Goals: Week 2: SLP Short Term Goal 1 (Week 2): Patient will consume trials of thin liquids with decreased oral holding and timely swallow initiation without overt s/s of aspiration to demonstrate readiness for diet uprgrade or instrumental study. SLP Short Term Goal 2 (Week 2): Patient will demonstrate efficient mastication and minimal oral resiude without overt s/s of aspiration with solid textures over 2 sessions prior to diet advancement.  SLP Short Term Goal 3 (Week 2): Patient will scan/attend to right field of enviornment during functional/structured tasks with Max A verbal cues for ~2 minutes.  SLP Short Term Goal 4 (Week 2): Patient will demonstrate sustained attention to a task for 2 minutes with Max A verbal cues.  SLP Short Term Goal 5 (Week 2): Patient will utilize gestures to answer basic yes/no questions in regards to wants/needs with Max A verbal cues in 75% of opportunities.  SLP Short Term Goal 6 (Week 2): Patient will verbalize at the word level with Max A multimodal cues in 10% of opportunities.   Skilled Therapeutic Interventions: Skilled treatment session focused on dysphagia goals. SLP performed oral care via the suction toothbrush and administered trials of ice chips. Patient demonstrated efficient and timely mastication but also demonstrated what appeared to be a delayed swallow initiation resulting in overt coughing in 50% of trials. Recommend patient continue nectar-thick liquids via tsp. Patient left upright in wheelchair with all needs within reach. Continue with current plan of care.      Function:  Eating Eating   Modified Consistency Diet: Yes Eating Assist Level: Helper feeds patient;Supervision or verbal cues        Helper Brings Food to Mouth: Every scoop   Cognition Comprehension Comprehension assist level: Understands basic less than 25% of the time/ requires cueing >75% of the time  Expression   Expression assist level: Expresses basis less than 25% of the time/requires cueing >75% of the time.  Social Interaction Social Interaction assist level: Interacts appropriately less than 25% of the time. May be withdrawn or combative.  Problem Solving Problem solving assist level: Solves basic less than 25% of the time - needs direction nearly all the time or does not effectively solve problems and may need a restraint for safety  Memory Memory assist level: Recognizes or recalls less than 25% of the time/requires cueing greater than 75% of the time    Pain Pain Assessment Pain Assessment: Faces Faces Pain Scale: No hurt  Therapy/Group: Individual Therapy  Filimon Miranda 06/19/2017, 4:54 PM

## 2017-06-20 ENCOUNTER — Inpatient Hospital Stay (HOSPITAL_COMMUNITY): Payer: Medicare Other | Admitting: Occupational Therapy

## 2017-06-20 ENCOUNTER — Inpatient Hospital Stay (HOSPITAL_COMMUNITY): Payer: Self-pay | Admitting: Physical Therapy

## 2017-06-20 ENCOUNTER — Inpatient Hospital Stay (HOSPITAL_COMMUNITY): Payer: Self-pay

## 2017-06-20 ENCOUNTER — Inpatient Hospital Stay (HOSPITAL_COMMUNITY): Payer: Medicare Other | Admitting: Speech Pathology

## 2017-06-20 DIAGNOSIS — I69351 Hemiplegia and hemiparesis following cerebral infarction affecting right dominant side: Secondary | ICD-10-CM | POA: Diagnosis not present

## 2017-06-20 DIAGNOSIS — R41841 Cognitive communication deficit: Secondary | ICD-10-CM | POA: Diagnosis not present

## 2017-06-20 DIAGNOSIS — I639 Cerebral infarction, unspecified: Secondary | ICD-10-CM | POA: Diagnosis not present

## 2017-06-20 DIAGNOSIS — R4701 Aphasia: Secondary | ICD-10-CM | POA: Diagnosis not present

## 2017-06-20 DIAGNOSIS — I6340 Cerebral infarction due to embolism of unspecified cerebral artery: Secondary | ICD-10-CM | POA: Diagnosis not present

## 2017-06-20 MED ORDER — CEPHALEXIN 250 MG PO CAPS
250.0000 mg | ORAL_CAPSULE | Freq: Three times a day (TID) | ORAL | Status: DC
  Administered 2017-06-20 – 2017-06-24 (×13): 250 mg via ORAL
  Filled 2017-06-20 (×13): qty 1

## 2017-06-20 NOTE — Progress Notes (Signed)
Subjective/Complaints: Attending better still non verbal except humming ROS- cannot obtain due to MS Objective: Vital Signs: Blood pressure (!) 153/51, pulse (!) 56, temperature 97.7 F (36.5 C), temperature source Axillary, resp. rate 17, height 5\' 3"  (1.6 m), weight 67.6 kg (149 lb 0.5 oz), SpO2 100 %. No results found. Results for orders placed or performed during the hospital encounter of 06/06/17 (from the past 72 hour(s))  Urinalysis, Routine w reflex microscopic     Status: Abnormal   Collection Time: 06/18/17  4:04 PM  Result Value Ref Range   Color, Urine YELLOW YELLOW   APPearance TURBID (A) CLEAR   Specific Gravity, Urine 1.010 1.005 - 1.030   pH 8.5 (H) 5.0 - 8.0   Glucose, UA NEGATIVE NEGATIVE mg/dL   Hgb urine dipstick SMALL (A) NEGATIVE   Bilirubin Urine NEGATIVE NEGATIVE   Ketones, ur NEGATIVE NEGATIVE mg/dL   Protein, ur 782 (A) NEGATIVE mg/dL   Nitrite NEGATIVE NEGATIVE   Leukocytes, UA LARGE (A) NEGATIVE  Urinalysis, Microscopic (reflex)     Status: Abnormal   Collection Time: 06/18/17  4:04 PM  Result Value Ref Range   RBC / HPF 0-5 0 - 5 RBC/hpf   WBC, UA TOO NUMEROUS TO COUNT 0 - 5 WBC/hpf   Bacteria, UA MANY (A) NONE SEEN   Squamous Epithelial / LPF NONE SEEN NONE SEEN   Triple Phosphate Crystal PRESENT      HEENT: normal Cardio: RRR and no murmur Resp: CTA B/L and unlabored GI: BS positive and NT, ND Extremity:  No Edema Skin:   Intact Neuro: awake, RIght neglect and left gaze preference, will sqeeze with BUE but poor release, clonus at bilateral ankles, absent Left patellar reflex, normal right patellar, left flexor withdrawl Musc/Skel:  Other no pain with UE or LE PROM, no jt effusions noted Gen NAD   Assessment/Plan: 1. 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 Physiatrist is providing close team  supervision and 24 hour management of active medical problems listed below. Physiatrist and rehab team continue to assess barriers to discharge/monitor patient progress toward functional and medical goals. FIM: Function - Bathing Position: Bed Body parts bathed by patient: Right upper leg, Abdomen Body parts bathed by helper: Right arm, Left arm, Chest, Abdomen, Buttocks, Front perineal area, Left upper leg, Right lower leg, Left lower leg, Back Assist Level: Touching or steadying assistance(Pt > 75%) (Total A of 1)  Function- Upper Body Dressing/Undressing What is the patient wearing?: Pull over shirt/dress Pull over shirt/dress - Perfomed by patient: Put head through opening Pull over shirt/dress - Perfomed by helper: Thread/unthread left sleeve, Thread/unthread right sleeve Assist Level:  (total A of 1) Function - Lower Body Dressing/Undressing What is the patient wearing?: Pants Position: Bed Pants- Performed by helper: Thread/unthread right pants leg, Thread/unthread left pants leg, Pull pants up/down Non-skid slipper socks- Performed by helper: Don/doff left sock, Don/doff right sock Socks - Performed by helper: Don/doff left sock, Don/doff right sock Assist for footwear: Dependant Assist for lower body dressing: Touching or steadying assistance (Pt > 75%) (Total A of 1)  Function - Toileting Toileting activity did not occur: No continent bowel/bladder event Toileting steps completed by helper: Adjust clothing prior to toileting, Performs perineal hygiene, Adjust clothing after toileting Assist level: Two helpers  Function - Archivist transfer activity did not occur: Safety/medical concerns Toilet transfer assistive device: Elevated toilet  seat/BSC over toilet, Mechanical lift Mechanical lift: Stedy Assist level to toilet: 2 helpers (per [employer] report) Assist level to bedside commode (at bedside): 2 helpers Assist level from bedside commode (at bedside): 2  helpers  Function - Chair/bed transfer Chair/bed transfer activity did not occur: Safety/medical concerns Chair/bed transfer method: Squat pivot Chair/bed transfer assist level: 2 helpers Chair/bed transfer assistive device: Mechanical lift Mechanical lift: Stedy Chair/bed transfer details: Manual facilitation for weight shifting, Manual facilitation for placement, Manual facilitation for weight bearing, Tactile cues for initiation, Tactile cues for weight shifting, Tactile cues for sequencing  Function - Locomotion: Wheelchair Type: Manual Max wheelchair distance: 41ft  Assist Level: Moderate assistance (Pt 50 - 74%) Wheel 50 feet with 2 turns activity did not occur: Refused Wheel 150 feet activity did not occur: Safety/medical concerns Function - Locomotion: Ambulation Assistive device: Parallel bars Max distance: 2 Assist level: Maximal assist (Pt 25 - 49%) Walk 10 feet activity did not occur: Safety/medical concerns Walk 50 feet with 2 turns activity did not occur: Safety/medical concerns Walk 150 feet activity did not occur: Safety/medical concerns Walk 10 feet on uneven surfaces activity did not occur: Safety/medical concerns  Function - Comprehension Comprehension: Auditory Comprehension assist level: Understands basic less than 25% of the time/ requires cueing >75% of the time  Function - Expression Expression: Nonverbal Expression assist level: Expresses basis less than 25% of the time/requires cueing >75% of the time.  Function - Social Interaction Social Interaction assist level: Interacts appropriately less than 25% of the time. May be withdrawn or combative.  Function - Problem Solving Problem solving assist level: Solves basic less than 25% of the time - needs direction nearly all the time or does not effectively solve problems and may need a restraint for safety  Function - Memory Memory assist level: Recognizes or recalls less than 25% of the time/requires  cueing greater than 75% of the time Patient normally able to recall (first 3 days only): None of the above  Medical Problem List and Plan: 1.  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 DVT Prophylaxis/Anticoagulation: Eliquis 3. Pain Management: Tylenol 4. Mood: Prozac 40 mg daily, Remeron 15 mg daily at bedtime- both of which which will be d/ced due to somnolence, started on Ritalin with some increased alertness -per neuropsych neurologic, not mood issues determining behavior -family supportive as well, FL2 placed 5. Neuropsych: This patient is capable of making decisions on herown behalf. 6. Skin/Wound Care: Routine skin checks 7. Fluids/Electrolytes/Nutrition: RoutineI&O's , Normalized  BUN and Creat, d/c IVF at noc May be in part due to Cozaar since the intake appears good per RN, on lower dose may tolerate higher dose if pt can keep up with fluid intake 8.PAF.Cardiac rate control. Continue Eliquis- monitor Hgb 9.Hypertension.Coreg 25 mg BID,Cozaar 50 mg daily,amlodipine increased to 10mg  on 6/24, systolic trending down but am values still elevated increase cozaar back to 100mg   Vitals:   06/19/17 1444 06/20/17 0509  BP: (!) 157/94 (!) 153/51  Pulse: 84 (!) 56  Resp: 20 17  Temp: 98.6 F (37 C) 97.7 F (36.5 C)   10.Hyperlipidemia. Lipitor 11.  Somnolence- this is in part abulia due to bifrontal infarcts  Ritalin trial- No sig improvement will not continue after discharge 12.  Bradycardia improved  off verapamil  13.  Feverx1 + UA , Keflex x 5 d  LOS (Days) 14 A FACE TO FACE EVALUATION WAS PERFORMED  Terrionna Bridwell E 06/20/2017, 6:54 AM

## 2017-06-20 NOTE — Progress Notes (Signed)
Physical Therapy Session Note  Patient Details  Name: Christina [name] MRN: [id number] Date of Birth: 1946/10/09  Today's Date: 06/20/2017 PT Individual Time: 1100-1200 PT Individual Time Calculation (min): 60 min   Short Term Goals: Week 2:  PT Short Term Goal 1 (Week 2): =LTGs due to ELOS  Skilled Therapeutic Interventions/Progress Updates:    pt with no c/o pain, more alert this session with inconsistent responses to yes/no questions from therapist.  Pt's daughter continues to be absent from PT sessions for family education.  Provided with written list of questions pertaining to w/c evaluation and d/c next week.  Session focus on intiation and attention in quiet and distracting environments.  Pt initiating functional movements in ~40-50% of opportunities in distracting environment this session.  Pt returned to room at end of session and positioned in tilt in space with QRB in place, call bell in reach and needs met.   Therapy Documentation Precautions:  Precautions Precautions: Fall Restrictions Weight Bearing Restrictions: No   See Function Navigator for Current Functional Status.   Therapy/Group: Individual Therapy  Earnest Conroy Penven-Crew 06/20/2017, 12:10 PM

## 2017-06-20 NOTE — Progress Notes (Signed)
Occupational Therapy Session Note  Patient Details  Name: Christina [name] MRN: [id number] Date of Birth: December 20, 1946  Today's Date: 06/20/2017 OT Individual Time: 1435-1500 OT Individual Time Calculation (min): 25 min    Short Term Goals: Week 2:  OT Short Term Goal 1 (Week 2): LTG=STG 2/2 ELOS  Skilled Therapeutic Interventions/Progress Updates:    Treatment session with focus on attention, arousal, and attempting to engage in basic functional task.  Utilized music throughout session to maintain attention and alertness with pt intermittently bobbing head to familiar music.  Attempted to engage pt in removing dead petals from flower bouquet with pt unable to release Lt hand grip on Rt forearm despite attempts to assist pt with removal.  Pt with no initiation to task, however maintained arousal and attention to therapist throughout session.  Returned to room and left upright in tilt in space w/c with quick release belt on.  No family present for education during session.  Therapy Documentation Precautions:  Precautions Precautions: Fall Restrictions Weight Bearing Restrictions: No General:   Vital Signs: Therapy Vitals Temp: 99 F (37.2 C) Temp Source: Oral Pulse Rate: 67 Resp: 18 BP: (!) 163/70 Patient Position (if appropriate): Sitting Oxygen Therapy SpO2: 100 % O2 Device: Not Delivered Pain: Pain Assessment Pain Assessment: No/denies pain  See Function Navigator for Current Functional Status.   Therapy/Group: Individual Therapy  Christina [name], Deaken Jurgens 06/20/2017, 3:32 PM

## 2017-06-20 NOTE — Progress Notes (Signed)
Physical Therapy Session Note  Patient Details  Name: Christina [name] MRN: [id number] Date of Birth: 07/09/1946  Today's Date: 06/20/2017 PT Individual Time: [id number] PT Individual Time Calculation (min): 30 min   Short Term Goals: Week 2:  PT Short Term Goal 1 (Week 2): =LTGs due to ELOS  Skilled Therapeutic Interventions/Progress Updates:    Session focused on addressing initiation and attention in quiet environment while engaging in functional task. Pt initiated about 40% of the time with verbal and tactile cues to sustain attention. Utilized music to regain pt's attention and alertness as she began to fade and pt responded well bobbing her head along to the music appropriately. Returned to room with quick release belt on. No family present during session.  Therapy Documentation Precautions:  Precautions Precautions: Fall Restrictions Weight Bearing Restrictions: No  Pain: Pt non verbal - does not appear in distress.    See Function Navigator for Current Functional Status.   Therapy/Group: Individual Therapy  Karolee StampsGray, Takesha Steger Darrol PokeBrescia  Akim Watkinson B. Rebeka Kimble, PT, DPT  06/20/2017, 1:34 PM

## 2017-06-20 NOTE — Discharge Instructions (Signed)
Inpatient Rehab Discharge Instructions  [facility] Discharge date and time: No discharge date for patient encounter.   Activities/Precautions/ Functional Status: Activity: activity as tolerated Diet:  STROKE/TIA DISCHARGE INSTRUCTIONS SMOKING Cigarette smoking nearly doubles your risk of having a stroke & is the single most alterable risk factor  If you smoke or have smoked in the last 12 months, you are advised to quit smoking for your health.  Most of the excess cardiovascular risk related to smoking disappears within a year of stopping.  Ask you doctor about anti-smoking medications  [place] Quit Line: 1-800-QUIT NOW  Free Smoking Cessation Classes [phone]  CHOLESTEROL Know your levels; limit fat & cholesterol in your diet  Lipid Panel     Component Value Date/Time   CHOL 166 05/08/2017 1026   TRIG 182 (H) 05/08/2017 1026   HDL 46 05/08/2017 1026   CHOLHDL 3.6 05/08/2017 1026   CHOLHDL 3.9 03/15/2017 0725   VLDL 35 03/15/2017 0725   LDLCALC 84 05/08/2017 1026      Many patients benefit from treatment even if their cholesterol is at goal.  Goal: Total Cholesterol (CHOL) less than 160  Goal:  Triglycerides (TRIG) less than 150  Goal:  HDL greater than 40  Goal:  LDL (LDLCALC) less than 100   BLOOD PRESSURE American Stroke Association blood pressure target is less that 120/80 mm/Hg  Your discharge blood pressure is:  BP: (!) 178/74  Monitor your blood pressure  Limit your salt and alcohol intake  Many individuals will require more than one medication for high blood pressure  DIABETES (A1c is a blood sugar average for last 3 months) Goal HGBA1c is under 7% (HBGA1c is blood sugar average for last 3 months)  Diabetes: No known diagnosis of diabetes    Lab Results  Component Value Date   HGBA1C 5.7 (H) 03/15/2017     Your HGBA1c can be lowered with medications, healthy diet, and exercise.  Check your blood sugar as directed by your physician  Call your  physician if you experience unexplained or low blood sugars.  PHYSICAL ACTIVITY/REHABILITATION Goal is 30 minutes at least 4 days per week  Activity: Increase activity slowly, Therapies: Physical Therapy: Home Health Return to work:   Activity decreases your risk of heart attack and stroke and makes your heart stronger.  It helps control your weight and blood pressure; helps you relax and can improve your mood.  Participate in a regular exercise program.  Talk with your doctor about the best form of exercise for you (dancing, walking, swimming, cycling).  DIET/WEIGHT Goal is to maintain a healthy weight  Your discharge diet is: DIET - DYS 1 Room service appropriate? Yes; Fluid consistency: Nectar Thick  liquids Your height is:  Height: 5\' 3"  (160 cm) Your current weight is: Weight: 67.6 kg (149 lb 0.5 oz) Your Body Mass Index (BMI) is:     Following the type of diet specifically designed for you will help prevent another stroke.  Your goal weight range is:    Your goal Body Mass Index (BMI) is 19-24.  Healthy food habits can help reduce 3 risk factors for stroke:  High cholesterol, hypertension, and excess weight.  RESOURCES Stroke/Support Group:  Call [phone]   STROKE EDUCATION PROVIDED/REVIEWED AND GIVEN TO PATIENT Stroke warning signs and symptoms How to activate emergency medical system (call 911). Medications prescribed at discharge. Need for follow-up after discharge. Personal risk factors for stroke. Pneumonia vaccine given:  Flu vaccine given:  My questions  have been answered, the writing is legible, and I understand these instructions.  I will adhere to these goals & educational materials that have been provided to me after my discharge from the hospital.    Wound Care: none needed Functional status:  ___ No restrictions     ___ Walk up steps independently ___ 24/7 supervision/assistance   ___ Walk up steps with assistance ___ Intermittent  supervision/assistance  ___ Bathe/dress independently ___ Walk with walker     x STROKE/TIA DISCHARGE INSTRUCTIONS SMOKING Cigarette smoking nearly doubles your risk of having a stroke & is the single most alterable risk factor  If you smoke or have smoked in the last 12 months, you are advised to quit smoking for your health.  Most of the excess cardiovascular risk related to smoking disappears within a year of stopping.  Ask you doctor about anti-smoking medications  [place] Quit Line: 1-800-QUIT NOW  Free Smoking Cessation Classes [phone]  CHOLESTEROL Know your levels; limit fat & cholesterol in your diet  Lipid Panel     Component Value Date/Time   CHOL 166 05/08/2017 1026   TRIG 182 (H) 05/08/2017 1026   HDL 46 05/08/2017 1026   CHOLHDL 3.6 05/08/2017 1026   CHOLHDL 3.9 03/15/2017 0725   VLDL 35 03/15/2017 0725   LDLCALC 84 05/08/2017 1026      Many patients benefit from treatment even if their cholesterol is at goal.  Goal: Total Cholesterol (CHOL) less than 160  Goal:  Triglycerides (TRIG) less than 150  Goal:  HDL greater than 40  Goal:  LDL (LDLCALC) less than 100   BLOOD PRESSURE American Stroke Association blood pressure target is less that 120/80 mm/Hg  Your discharge blood pressure is:  BP: (!) 178/74  Monitor your blood pressure  Limit your salt and alcohol intake  Many individuals will require more than one medication for high blood pressure  DIABETES (A1c is a blood sugar average for last 3 months) Goal HGBA1c is under 7% (HBGA1c is blood sugar average for last 3 months)  Diabetes: No known diagnosis of diabetes    Lab Results  Component Value Date   HGBA1C 5.7 (H) 03/15/2017     Your HGBA1c can be lowered with medications, healthy diet, and exercise.  Check your blood sugar as directed by your physician  Call your physician if you experience unexplained or low blood sugars.  PHYSICAL ACTIVITY/REHABILITATION Goal is 30 minutes at least 4 days  per week  Activity: Increase activity slowly, Therapies: Physical Therapy: Home Health Return to work:   Activity decreases your risk of heart attack and stroke and makes your heart stronger.  It helps control your weight and blood pressure; helps you relax and can improve your mood.  Participate in a regular exercise program.  Talk with your doctor about the best form of exercise for you (dancing, walking, swimming, cycling).  DIET/WEIGHT Goal is to maintain a healthy weight  Your discharge diet is: DIET - DYS 1 Room service appropriate? Yes; Fluid consistency: Nectar Thick  liquids Your height is:  Height: 5\' 3"  (160 cm) Your current weight is: Weight: 67.6 kg (149 lb 0.5 oz) Your Body Mass Index (BMI) is:     Following the type of diet specifically designed for you will help prevent another stroke.  Your goal weight range is:    Your goal Body Mass Index (BMI) is 19-24.  Healthy food habits can help reduce 3 risk factors for stroke:  High cholesterol, hypertension,  and excess weight.  RESOURCES Stroke/Support Group:  Call [phone]   STROKE EDUCATION PROVIDED/REVIEWED AND GIVEN TO PATIENT Stroke warning signs and symptoms How to activate emergency medical system (call 911). Medications prescribed at discharge. Need for follow-up after discharge. Personal risk factors for stroke. Pneumonia vaccine given:  Flu vaccine given:  My questions have been answered, the writing is legible, and I understand these instructions.  I will adhere to these goals & educational materials that have been provided to me after my discharge from the hospital.   Bathe/dress with assistance ___ Walk Independently    ___ Shower independently ___ Walk with assistance    _xInpatient Rehab Discharge Instructions  Christina [name] Discharge date and time: No discharge date for patient encounter.   Activities/Precautions/ Functional Status: Activity: activity as tolerated Diet: dysphagia 2 nectar  liquid Wound Care: none needed Functional status:  ___ No restrictions     ___ Walk up steps independently ___ 24/7 supervision/assistance   ___ Walk up steps with assistance ___ Intermittent supervision/assistance  ___ Bathe/dress independently ___ Walk with walker     _x__ Bathe/dress with assistance ___ Walk Independently    ___ Shower independently ___ Walk with assistance    ___ Shower with assistance ___ No alcohol     ___ Return to work/school ________  Special Instructions:    COMMUNITY REFERRALS UPON DISCHARGE:    Home Health:   PT, OT, SP   Agency:[facility] Phone:[phone]   Date of last service:06/24/2017  Medical Equipment/Items Ordered:[facility] BED-WHEELCHAIR EVAL DONE TODAY VIA [facility]  Agency/Supplier:[facility]   [phone]  Other:PCS SERVICES REFERRAL MADE AND PLACED ON CAP WAITING LIST  GENERAL COMMUNITY RESOURCES FOR PATIENT/FAMILY: Support Groups:[facility] SUPPORT GROUP EVERY SECOND Thursday (SEPT-MAY) ON THE REHAB UNIT @ 3:00-4:00 PM  QUESTIONS CONTACT [facility] [phone]   My questions have been answered and I understand these instructions. I will adhere to these goals and the provided educational materials after my discharge from the hospital.  Patient/Caregiver Signature _______________________________ Date __________  Clinician Signature _______________________________________ Date __________  Please bring this form and your medication list with you to all your follow-up doctor's appointments. __ Shower with assistance ___ No alcohol     ___ Return to work/school ________  Special Instructions:    My questions have been answered and I understand these instructions. I will adhere to these goals and the provided educational materials after my discharge from the hospital.  Patient/Caregiver Signature _______________________________ Date __________  Clinician Signature _______________________________________ Date  __________  Please bring this form and your medication list with you to all your follow-up doctor's appointments.

## 2017-06-20 NOTE — Progress Notes (Signed)
Speech Language Pathology Daily Session Note  Patient Details  Name: Christina [name] MRN: [id number] Date of Birth: Mar 24, 1946  Today's Date: 06/20/2017 SLP Individual Time: 0830-0900 SLP Individual Time Calculation (min): 30 min  Short Term Goals: Week 3: SLP Short Term Goal 1 (Week 3): STGs=LTGs  Skilled Therapeutic Interventions: Skilled treatment session focused on dysphagia goals. SLP facilitated session by providing skilled observation of pt consuming dysphagia 2 breakfast tray with nectar thick liquids via spoon. Pt with trace buccal residue on left but cleared well when presented with nectar thick liquids. Pt left upright in bed, bed alarm on and all needs within reach. Continue per current plan of care.      Function:  Eating Eating   Modified Consistency Diet: Yes Eating Assist Level: Helper feeds patient       Helper Brings Food to Mouth: Every scoop   Cognition Comprehension Comprehension assist level: Understands basic less than 25% of the time/ requires cueing >75% of the time  Expression   Expression assist level: Expresses basis less than 25% of the time/requires cueing >75% of the time.  Social Interaction Social Interaction assist level: Interacts appropriately less than 25% of the time. May be withdrawn or combative.  Problem Solving Problem solving assist level: Solves basic less than 25% of the time - needs direction nearly all the time or does not effectively solve problems and may need a restraint for safety  Memory Memory assist level: Recognizes or recalls less than 25% of the time/requires cueing greater than 75% of the time    Pain Pain Assessment Pain Assessment: No/denies pain  Therapy/Group: Individual Therapy  Traveon Louro 06/20/2017, 4:09 PM

## 2017-06-20 NOTE — Progress Notes (Signed)
Occupational Therapy Session Note  Patient Details  Name: Christina [name] MRN: [id number] Date of Birth: 1946/05/04  Today's Date: 06/20/2017 OT Individual Time: 0930-1015 OT Individual Time Calculation (min): 45 min    Short Term Goals: Week 1:  OT Short Term Goal 1 (Week 1): Pt will maintain sitting balance with MIN A in prep for toileting OT Short Term Goal 1 - Progress (Week 1): Not met OT Short Term Goal 2 (Week 1): Pt will squat pivot transfer with MAX A to w/c in prep for BSC transfer OT Short Term Goal 3 (Week 1): Pt will don pullover shirt with MAX A OT Short Term Goal 3 - Progress (Week 1): Not met OT Short Term Goal 4 (Week 1): Pt will maintain arousal for 5 min  OT Short Term Goal 4 - Progress (Week 1): Met OT Short Term Goal 5 (Week 1): Pt will locate 4/4 grooming items on sink with MIN VC OT Short Term Goal 5 - Progress (Week 1): Not met Week 2:  OT Short Term Goal 1 (Week 2): LTG=STG 2/2 ELOS  Skilled Therapeutic Interventions/Progress Updates:    Pt seen for BADL retraining. No family available for family education this am. Pt in bed leaning and turned to her L side. Pt repositioned. Pt using perseverative behavior scratching her legs while holding stress balls in her hands that she was constantly squeezing.   Pt had a wet brief on.  Total A with bathing due to her perseverative movement patterns. Pt unable to wash other body parts even with hand over hand guiding as she was not able to visually attend to tasks.  Max A with rolling in bed to change brief, don new clothing. Adjusted in bed in semi sitting position for UB self care. She did initiate with helping to doff and don shirt over head but needed A due to incoordination. She would nod in response to basic directions/ instructions, but would repeat the same sounds/ words numerous times.   Pt in bed with pillows for support to keep her in midline.  Bed alarm set, call light in reach.   Therapy Documentation Precautions:   Precautions Precautions: Fall Restrictions Weight Bearing Restrictions: No  Pain: Pain Assessment Pain Assessment: No/denies pain ADL:      See Function Navigator for Current Functional Status.   Therapy/Group: Individual Therapy  [place] 06/20/2017, 12:16 PM

## 2017-06-21 ENCOUNTER — Ambulatory Visit (HOSPITAL_COMMUNITY): Payer: Self-pay | Admitting: Physical Therapy

## 2017-06-21 ENCOUNTER — Encounter (HOSPITAL_COMMUNITY): Payer: Self-pay | Admitting: *Deleted

## 2017-06-21 DIAGNOSIS — I10 Essential (primary) hypertension: Secondary | ICD-10-CM | POA: Diagnosis not present

## 2017-06-21 DIAGNOSIS — I639 Cerebral infarction, unspecified: Secondary | ICD-10-CM | POA: Diagnosis not present

## 2017-06-21 DIAGNOSIS — I69351 Hemiplegia and hemiparesis following cerebral infarction affecting right dominant side: Secondary | ICD-10-CM | POA: Diagnosis not present

## 2017-06-21 DIAGNOSIS — R4701 Aphasia: Secondary | ICD-10-CM | POA: Diagnosis not present

## 2017-06-21 DIAGNOSIS — I169 Hypertensive crisis, unspecified: Secondary | ICD-10-CM

## 2017-06-21 DIAGNOSIS — I6340 Cerebral infarction due to embolism of unspecified cerebral artery: Secondary | ICD-10-CM | POA: Diagnosis not present

## 2017-06-21 DIAGNOSIS — R41841 Cognitive communication deficit: Secondary | ICD-10-CM | POA: Diagnosis not present

## 2017-06-21 DIAGNOSIS — N390 Urinary tract infection, site not specified: Secondary | ICD-10-CM

## 2017-06-21 LAB — URINE CULTURE

## 2017-06-21 MED ORDER — HYDRALAZINE HCL 10 MG PO TABS
10.0000 mg | ORAL_TABLET | Freq: Three times a day (TID) | ORAL | Status: DC
  Administered 2017-06-21 – 2017-06-24 (×10): 10 mg via ORAL
  Filled 2017-06-21 (×10): qty 1

## 2017-06-21 NOTE — Progress Notes (Signed)
Physical Therapy Session Note  Patient Details  Name: Christina [name] MRN: [id number] Date of Birth: 1946-05-18  Today's Date: 06/21/2017 PT Individual Time: 1300-1400 PT Individual Time Calculation (min): 60 min   Short Term Goals: Week 2:  PT Short Term Goal 1 (Week 2): =LTGs due to ELOS  Skilled Therapeutic Interventions/Progress Updates:    no indication of pain. Session focus on sustained attention to feeding in quiet environment.   Pt able to keep eyes open for ~50% of session.  Requires total assist for feeding 90% of the time with 3 successful trials of hand over hand assist to scoop food and bring to mouth.  Pt tracks therapist past midline to R 100% of the time with mod cues, and responds to verbal and tactile stimuli in 60% of trials. Pt left supine in bed with alarm activated, call bell in reach and needs met.   Therapy Documentation Precautions:  Precautions Precautions: Fall Restrictions Weight Bearing Restrictions: No   See Function Navigator for Current Functional Status.   Therapy/Group: Individual Therapy  Talon Witting E Penven-Crew 06/21/2017, 2:03 PM

## 2017-06-21 NOTE — Progress Notes (Addendum)
Subjective/Complaints: Pt seen laying in bed this AM.  She remains nonverbal.    ROS- Unable to assess due to mentation  Objective: Vital Signs: Blood pressure (!) 180/64, pulse 68, temperature 98.5 F (36.9 C), temperature source Oral, resp. rate 18, height 5\' 3"  (1.6 m), weight 67.6 kg (149 lb 0.5 oz), SpO2 100 %. No results found. Results for orders placed or performed during the hospital encounter of 06/06/17 (from the past 72 hour(s))  Urinalysis, Routine w reflex microscopic     Status: Abnormal   Collection Time: 06/18/17  4:04 PM  Result Value Ref Range   Color, Urine YELLOW YELLOW   APPearance TURBID (A) CLEAR   Specific Gravity, Urine 1.010 1.005 - 1.030   pH 8.5 (H) 5.0 - 8.0   Glucose, UA NEGATIVE NEGATIVE mg/dL   Hgb urine dipstick SMALL (A) NEGATIVE   Bilirubin Urine NEGATIVE NEGATIVE   Ketones, ur NEGATIVE NEGATIVE mg/dL   Protein, ur 161 (A) NEGATIVE mg/dL   Nitrite NEGATIVE NEGATIVE   Leukocytes, UA LARGE (A) NEGATIVE  Urine Culture     Status: Abnormal (Preliminary result)   Collection Time: 06/18/17  4:04 PM  Result Value Ref Range   Specimen Description URINE, CATHETERIZED    Special Requests NONE    Culture (A)     >=100,000 COLONIES/mL PROTEUS MIRABILIS SUSCEPTIBILITIES TO FOLLOW    Report Status PENDING   Urinalysis, Microscopic (reflex)     Status: Abnormal   Collection Time: 06/18/17  4:04 PM  Result Value Ref Range   RBC / HPF 0-5 0 - 5 RBC/hpf   WBC, UA TOO NUMEROUS TO COUNT 0 - 5 WBC/hpf   Bacteria, UA MANY (A) NONE SEEN   Squamous Epithelial / LPF NONE SEEN NONE SEEN   Triple Phosphate Crystal PRESENT     Gen NAD. Vital signs reviewed HEENT: normocephalic, atraumatic Cardio: RRR and no JVD Resp: CTA B/L and Unlabored GI: BS positive and ND Musc: No Edema. No tenderness Skin:  Intact. Warm and dry Neuro: Alert RIght neglect and left gaze preference Clonus at bilateral ankles Not following commands. RUE  jerking  Assessment/Plan: 1. 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 Physiatrist is providing close team supervision and 24 hour management of active medical problems listed below. Physiatrist and rehab team continue to assess barriers to discharge/monitor patient progress toward functional and medical goals. FIM: Function - Bathing Position: Bed Body parts bathed by patient: Right upper leg Body parts bathed by helper: Right arm, Left arm, Chest, Abdomen, Buttocks, Front perineal area, Left upper leg, Right lower leg, Left lower leg, Back Assist Level: Touching or steadying assistance(Pt > 75%) (Total A of 1)  Function- Upper Body Dressing/Undressing What is the patient wearing?: Pull over shirt/dress Pull over shirt/dress - Perfomed by patient: Put head through opening Pull over shirt/dress - Perfomed by helper: Thread/unthread left sleeve, Thread/unthread right sleeve, Pull shirt over trunk, Put head through opening (pt attempted to put head in, but needed A) Assist Level:  (total A of 1) Function - Lower Body Dressing/Undressing What is the patient wearing?: Pants, Non-skid slipper socks, Ted Hose Position: Bed Pants- Performed by helper: Thread/unthread right pants leg, Thread/unthread left pants leg, Pull pants up/down Non-skid slipper socks- Performed by helper: Don/doff left sock, Don/doff right sock Socks - Performed by helper: Don/doff left sock, Don/doff right sock TED Hose - Performed by helper: Don/doff  right TED hose, Don/doff left TED hose Assist for footwear: Dependant Assist for lower body dressing: Touching or steadying assistance (Pt > 75%) (Total A of 1)  Function - Toileting Toileting activity did not occur: No continent bowel/bladder event Toileting steps completed by helper: Adjust clothing prior to toileting, Performs  perineal hygiene, Adjust clothing after toileting Assist level: Two helpers  Function - Archivist transfer activity did not occur: Safety/medical concerns Toilet transfer assistive device: Elevated toilet seat/BSC over toilet, Mechanical lift Mechanical lift: Stedy Assist level to toilet: 2 helpers (per [employer] report) Assist level to bedside commode (at bedside): 2 helpers Assist level from bedside commode (at bedside): 2 helpers  Function - Chair/bed transfer Chair/bed transfer activity did not occur: Safety/medical concerns Chair/bed transfer method: Squat pivot Chair/bed transfer assist level: Maximal assist (Pt 25 - 49%/lift and lower) Chair/bed transfer assistive device: Armrests Mechanical lift: Stedy Chair/bed transfer details: Manual facilitation for weight shifting, Manual facilitation for placement, Manual facilitation for weight bearing, Tactile cues for initiation, Tactile cues for weight shifting, Tactile cues for sequencing  Function - Locomotion: Wheelchair Type: Manual Max wheelchair distance: 50ft  Assist Level: Moderate assistance (Pt 50 - 74%) Wheel 50 feet with 2 turns activity did not occur: Refused Wheel 150 feet activity did not occur: Safety/medical concerns Function - Locomotion: Ambulation Assistive device: Parallel bars Max distance: 2 Assist level: Maximal assist (Pt 25 - 49%) Walk 10 feet activity did not occur: Safety/medical concerns Walk 50 feet with 2 turns activity did not occur: Safety/medical concerns Walk 150 feet activity did not occur: Safety/medical concerns Walk 10 feet on uneven surfaces activity did not occur: Safety/medical concerns  Function - Comprehension Comprehension: Auditory Comprehension assist level: Understands basic less than 25% of the time/ requires cueing >75% of the time  Function - Expression Expression: Nonverbal Expression assist level: Expresses basis less than 25% of the time/requires cueing >75%  of the time.  Function - Social Interaction Social Interaction assist level: Interacts appropriately less than 25% of the time. May be withdrawn or combative.  Function - Problem Solving Problem solving assist level: Solves basic less than 25% of the time - needs direction nearly all the time or does not effectively solve problems and may need a restraint for safety  Function - Memory Memory assist level: Recognizes or recalls less than 25% of the time/requires cueing greater than 75% of the time Patient normally able to recall (first 3 days only): None of the above  Medical Problem List and Plan: 1.  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 DVT Prophylaxis/Anticoagulation: Eliquis 3. Pain Management: Tylenol 4. Mood: Prozac 40 mg daily, Remeron 15 mg daily at bedtime- both of which which will be d/ced due to somnolence, started on Ritalin with some increased alertness -per neuropsych neurologic, not mood issues determining behavior -family supportive as well, FL2 placed 5. Neuropsych: This patient is capable of making decisions on herown behalf. 6. Skin/Wound Care: Routine skin checks 7. Fluids/Electrolytes/Nutrition: RoutineI&O's , Normalized  BUN and Creat, d/ced IVF at noc May be in part due to Cozaar since the intake appears good per RN, on lower dose may tolerate higher dose if pt can keep up with fluid intake 8.PAF.Cardiac rate control. Continue Eliquis 9.Hypertension.Coreg 25 mg BID,Cozaar 50 mg daily  amlodipine  increased to 10mg  on 6/24,  increased cozaar back to 100mg   Hydralizine 10 TID started 6/30  Hypertensive crisis this AM 10.Hyperlipidemia. Lipitor 11.  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  Bradycardia improved off verapamil  13.  Acute lower UTI  Ucx pending  Empiric Keflex x 6/29-7/4   LOS (Days) 15 A FACE TO FACE EVALUATION WAS PERFORMED  Ahtziri Jeffries Karis JubaAnil Mayling Aber 06/21/2017, 8:22 AM

## 2017-06-22 ENCOUNTER — Encounter (HOSPITAL_COMMUNITY): Payer: Self-pay | Admitting: Occupational Therapy

## 2017-06-22 DIAGNOSIS — R4701 Aphasia: Secondary | ICD-10-CM | POA: Diagnosis not present

## 2017-06-22 DIAGNOSIS — I639 Cerebral infarction, unspecified: Secondary | ICD-10-CM | POA: Diagnosis not present

## 2017-06-22 DIAGNOSIS — I69351 Hemiplegia and hemiparesis following cerebral infarction affecting right dominant side: Secondary | ICD-10-CM | POA: Diagnosis not present

## 2017-06-22 DIAGNOSIS — R41841 Cognitive communication deficit: Secondary | ICD-10-CM | POA: Diagnosis not present

## 2017-06-22 DIAGNOSIS — R253 Fasciculation: Secondary | ICD-10-CM | POA: Diagnosis not present

## 2017-06-22 DIAGNOSIS — I6340 Cerebral infarction due to embolism of unspecified cerebral artery: Secondary | ICD-10-CM | POA: Diagnosis not present

## 2017-06-22 DIAGNOSIS — I10 Essential (primary) hypertension: Secondary | ICD-10-CM | POA: Diagnosis not present

## 2017-06-22 DIAGNOSIS — N390 Urinary tract infection, site not specified: Secondary | ICD-10-CM | POA: Diagnosis not present

## 2017-06-22 MED ORDER — LORAZEPAM 0.5 MG PO TABS
1.0000 mg | ORAL_TABLET | Freq: Once | ORAL | Status: AC
  Administered 2017-06-22: 1 mg via ORAL
  Filled 2017-06-22: qty 2

## 2017-06-22 NOTE — Progress Notes (Signed)
Pt. Got the one time ativan dose as per MD. Keane Policerders,The EEG office was contacted before that and as per the answer machine they are open only from Monday to Friday.Keep monitoring pt.

## 2017-06-22 NOTE — Progress Notes (Signed)
Occupational Therapy Session Note  Patient Details  Name: Hardin Negusrinda Neely MRN: [id number] Date of Birth: 1946/09/23  Today's Date: 06/22/2017 OT Individual Time: 1330-1410 OT Individual Time Calculation (min): 40 min    Short Term Goals: Week 2:  OT Short Term Goal 1 (Week 2): LTG=STG 2/2 ELOS  Skilled Therapeutic Interventions/Progress Updates:    Pt greeted supine in bed, no family present for education. Had pt participate in PROM/AAROM stretching exercises of bilateral UEs/LEs for contracture mgt. Used [employer] music to promote relaxation and decrease tone. Pt extending bilateral UEs, including hands and digits, with extra time and manual facilitation. Rotational movements provided as needed for tone mgt. Gently guided UEs into shoulder flexion, abduction, IR/ER positions in beat to music. Pt making eye contact with therapist throughout, and appeared to initiate Rt UE sway in beat to music. B LE PROM bilateral hip, knee, ankle stretches. Floated pts heels to prevent breakdown. Provided pt with rolled up towels for makeshift antispasticity hand splints for contracture mgt. Repositioned pt in bed to promote upright postural alignment. 20 minutes missed due to pt closing eyes and exhibiting significant lethargy. Tried to attempt pt in singing, but pt clearly sleepy and nodding off. Bed alarm activated at time of departure.   Oriented her to time, place, and situation.   Therapy Documentation Precautions:  Precautions Precautions: Fall Restrictions Weight Bearing Restrictions: No General: General OT Amount of Missed Time: 20 Minutes   Pain: No s/s pain during tx    ADL:      See Function Navigator for Current Functional Status.   Therapy/Group: Individual Therapy  Damaya Channing A Patton Rabinovich 06/22/2017, 2:19 PM

## 2017-06-22 NOTE — Progress Notes (Addendum)
Subjective/Complaints:  Patient seen lying in bed this morning. No reported issues overnight. He continues to hum.  ROS- unable to assess due to mentation  Objective: Vital Signs: Blood pressure 136/63, pulse 81, temperature 97.9 F (36.6 C), temperature source Oral, resp. rate 18, height 5\' 3"  (1.6 m), weight 67.6 kg (149 lb 0.5 oz), SpO2 100 %. No results found. No results found for this or any previous visit (from the past 72 hour(s)).  Gen NAD. Vital signs reviewed HEENT: normocephalic, atraumatic Cardio: RRR and no JVD Resp: CTA B/L and Unlabored GI: BS positive and ND Musc: No Edema. No tenderness Skin:  Intact. Warm and dry Neuro: Alert RIght neglect and left gaze preference Clonus at bilateral ankles Not following commands. RUE jerking persistent Psych: Unable to assess due to mentation.  Assessment/Plan: 1. 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 Physiatrist is providing close team supervision and 24 hour management of active medical problems listed below. Physiatrist and rehab team continue to assess barriers to discharge/monitor patient progress toward functional and medical goals. FIM: Function - Bathing Position: Bed Body parts bathed by patient: Right upper leg Body parts bathed by helper: Right arm, Left arm, Chest, Abdomen, Buttocks, Front perineal area, Left upper leg, Right lower leg, Left lower leg, Back Assist Level: Touching or steadying assistance(Pt > 75%) (Total A of 1)  Function- Upper Body Dressing/Undressing What is the patient wearing?: Pull over shirt/dress Pull over shirt/dress - Perfomed by patient: Put head through opening Pull over shirt/dress - Perfomed by helper: Thread/unthread left sleeve, Thread/unthread right sleeve, Pull shirt over trunk, Put head through opening (pt attempted to put head in, but  needed A) Assist Level:  (total A of 1) Function - Lower Body Dressing/Undressing What is the patient wearing?: Pants, Non-skid slipper socks, Ted Hose Position: Bed Pants- Performed by helper: Thread/unthread right pants leg, Thread/unthread left pants leg, Pull pants up/down Non-skid slipper socks- Performed by helper: Don/doff left sock, Don/doff right sock Socks - Performed by helper: Don/doff left sock, Don/doff right sock TED Hose - Performed by helper: Don/doff right TED hose, Don/doff left TED hose Assist for footwear: Dependant Assist for lower body dressing: Touching or steadying assistance (Pt > 75%) (Total A of 1)  Function - Toileting Toileting activity did not occur: No continent bowel/bladder event Toileting steps completed by helper: Adjust clothing after toileting, Performs perineal hygiene, Adjust clothing prior to toileting Assist level: Two helpers  Function - Archivist transfer activity did not occur: Safety/medical concerns Toilet transfer assistive device: Elevated toilet seat/BSC over toilet, Mechanical lift Mechanical lift: Stedy Assist level to toilet: 2 helpers (per [employer] report) Assist level to bedside commode (at bedside): 2 helpers Assist level from bedside commode (at bedside): 2 helpers  Function - Chair/bed transfer Chair/bed transfer activity did not occur: Safety/medical concerns Chair/bed transfer method: Squat pivot Chair/bed transfer assist level: Maximal assist (Pt 25 - 49%/lift and lower) Chair/bed transfer assistive device: Armrests Mechanical lift: Stedy Chair/bed transfer details: Manual facilitation for weight shifting, Manual facilitation for placement, Manual facilitation for weight bearing, Tactile cues for initiation, Tactile cues for weight shifting, Tactile cues for sequencing  Function - Locomotion: Wheelchair Type: Manual Max wheelchair distance: 56ft  Assist Level: Moderate assistance (Pt 50 - 74%) Wheel 50  feet with 2 turns activity did not occur: Refused Wheel 150 feet activity did not occur: Safety/medical concerns Function -  Locomotion: Ambulation Assistive device: Parallel bars Max distance: 2 Assist level: Maximal assist (Pt 25 - 49%) Walk 10 feet activity did not occur: Safety/medical concerns Walk 50 feet with 2 turns activity did not occur: Safety/medical concerns Walk 150 feet activity did not occur: Safety/medical concerns Walk 10 feet on uneven surfaces activity did not occur: Safety/medical concerns  Function - Comprehension Comprehension: Auditory Comprehension assist level: Understands basic less than 25% of the time/ requires cueing >75% of the time  Function - Expression Expression: Nonverbal Expression assist level: Expresses basis less than 25% of the time/requires cueing >75% of the time.  Function - Social Interaction Social Interaction assist level: Interacts appropriately less than 25% of the time. May be withdrawn or combative.  Function - Problem Solving Problem solving assist level: Solves basic less than 25% of the time - needs direction nearly all the time or does not effectively solve problems and may need a restraint for safety  Function - Memory Memory assist level: Recognizes or recalls less than 25% of the time/requires cueing greater than 75% of the time Patient normally able to recall (first 3 days only): None of the above  Medical Problem List and Plan: 1.  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 DVT Prophylaxis/Anticoagulation: Eliquis 3. Pain Management: Tylenol 4. Mood: Prozac 40 mg daily, Remeron 15 mg daily at bedtime- both of which which will be d/ced due to somnolence, started on Ritalin with some increased alertness -per neuropsych  neurologic, not mood issues determining behavior -family supportive as well, FL2 placed 5. Neuropsych: This patient is capable of making decisions on herown behalf. 6. Skin/Wound Care: Routine skin checks 7. Fluids/Electrolytes/Nutrition: RoutineI&O's , Normalized  BUN and Creat, d/ced IVF at noc  Labs ordered for tomorrow May be in part due to Cozaar since the intake appears good per RN, on lower dose may tolerate higher dose if pt can keep up with fluid intake 8.PAF.Cardiac rate control. Continue Eliquis 9.Hypertension.Coreg 25 mg BID,Cozaar 50 mg daily  amlodipine increased to 10mg  on 6/24,  increased cozaar back to 100mg   Hydralizine 10 TID started 6/30  Controlled on 7/1 10.Hyperlipidemia. Lipitor 11.  Somnolence- this is in part abulia due to bifrontal infarcts  Ritalin trial- No sig improvement will not continue after discharge 12.  Bradycardia improved off verapamil  13.  Acute lower UTI  Ucx Proteus mirabilis  Continue Keflex 6/29-7/4 14. Myoclonic Jerking  EEG ordered  Trial ativan   LOS (Days) 16 A FACE TO FACE EVALUATION WAS PERFORMED  Ankit Karis Jubanil Patel 06/22/2017, 7:53 AM

## 2017-06-22 NOTE — Discharge Summary (Signed)
Discharge summary job # [id number]

## 2017-06-23 ENCOUNTER — Inpatient Hospital Stay (HOSPITAL_COMMUNITY): Payer: Medicare Other | Admitting: Speech Pathology

## 2017-06-23 ENCOUNTER — Inpatient Hospital Stay (HOSPITAL_COMMUNITY): Payer: Medicare Other

## 2017-06-23 ENCOUNTER — Inpatient Hospital Stay (HOSPITAL_COMMUNITY): Payer: Self-pay | Admitting: Physical Therapy

## 2017-06-23 ENCOUNTER — Inpatient Hospital Stay (HOSPITAL_COMMUNITY): Payer: Self-pay | Admitting: Occupational Therapy

## 2017-06-23 ENCOUNTER — Inpatient Hospital Stay (HOSPITAL_COMMUNITY): Payer: Self-pay

## 2017-06-23 DIAGNOSIS — N390 Urinary tract infection, site not specified: Secondary | ICD-10-CM | POA: Diagnosis not present

## 2017-06-23 DIAGNOSIS — R253 Fasciculation: Secondary | ICD-10-CM | POA: Diagnosis not present

## 2017-06-23 DIAGNOSIS — R6889 Other general symptoms and signs: Secondary | ICD-10-CM

## 2017-06-23 DIAGNOSIS — R4701 Aphasia: Secondary | ICD-10-CM | POA: Diagnosis not present

## 2017-06-23 DIAGNOSIS — R41841 Cognitive communication deficit: Secondary | ICD-10-CM | POA: Diagnosis not present

## 2017-06-23 DIAGNOSIS — I169 Hypertensive crisis, unspecified: Secondary | ICD-10-CM | POA: Diagnosis not present

## 2017-06-23 DIAGNOSIS — I69351 Hemiplegia and hemiparesis following cerebral infarction affecting right dominant side: Secondary | ICD-10-CM | POA: Diagnosis not present

## 2017-06-23 DIAGNOSIS — I6340 Cerebral infarction due to embolism of unspecified cerebral artery: Secondary | ICD-10-CM | POA: Diagnosis not present

## 2017-06-23 DIAGNOSIS — I639 Cerebral infarction, unspecified: Secondary | ICD-10-CM | POA: Diagnosis not present

## 2017-06-23 MED ORDER — ENSURE ENLIVE PO LIQD
237.0000 mL | Freq: Two times a day (BID) | ORAL | Status: DC
  Administered 2017-06-23: 237 mL via ORAL

## 2017-06-23 NOTE — Progress Notes (Signed)
Social Work Patient ID: Hardin NegusArinda Gerst, female   DOB: 01/10/1946, 71 y.o.   MRN: [id number]  Spoke with [name]-daughter per telephone to ask where she was. She reports she has gone through therapies and feels she Knows how to transfer, feed, change and care for pt. She has an ear infection and is planning to go to the MD today. [facility] is delivering equipment today from 12-4 pm. Discussed it would be good for her to come in and Make sure family education is completed prior to discharge tomorrow. Pt will be going home via non-emergency ambulance. Prepare for discharge tomorrow.

## 2017-06-23 NOTE — Progress Notes (Signed)
Speech Language Pathology Daily Session Note  Patient Details  Name: Hardin Negusrinda Monahan MRN: [id number] Date of Birth: May 03, 1946  Today's Date: 06/23/2017 SLP Individual Time: 1430-1450 SLP Individual Time Calculation (min): 20 min  Missed Time: 10 minutes, fatigue   Short Term Goals: Week 3: SLP Short Term Goal 1 (Week 3): STGs=LTGs  Skilled Therapeutic Interventions: Skilled treatment session focused on cognitive goals. Patient required total A to follow basic commands with functional self-care tasks and was nonverbal despite Max A multimodal cues. Patient lethargic throughout session and fell asleep. Therefore, patient missed remaining 10 minutes of session.      Function:  Cognition Comprehension Comprehension assist level: Understands basic less than 25% of the time/ requires cueing >75% of the time  Expression   Expression assist level: Expresses basis less than 25% of the time/requires cueing >75% of the time.  Social Interaction Social Interaction assist level: Interacts appropriately less than 25% of the time. May be withdrawn or combative.  Problem Solving Problem solving assist level: Solves basic less than 25% of the time - needs direction nearly all the time or does not effectively solve problems and may need a restraint for safety  Memory Memory assist level: Recognizes or recalls less than 25% of the time/requires cueing greater than 75% of the time    Pain No/Denies Pain   Therapy/Group: Individual Therapy  Hurley Blevins 06/23/2017, 4:14 PM

## 2017-06-23 NOTE — Progress Notes (Signed)
Subjective/Complaints: Pt seen laying in bed this Am.  She continues to hum with tremors.   ROS: Unable to assess due to mentation  Objective: Vital Signs: Blood pressure (!) 182/68, pulse 64, temperature 98 F (36.7 C), temperature source Oral, resp. rate 16, height 5\' 3"  (1.6 m), weight 67.6 kg (149 lb 0.5 oz), SpO2 100 %. No results found. No results found for this or any previous visit (from the past 72 hour(s)).  Gen NAD. Vital signs reviewed HEENT: normocephalic, atraumatic Cardio: RRR and no JVD Resp: CTA B/L and Unlabored GI: BS positive and ND Musc: No Edema. No tenderness Skin:  Intact. Warm and dry Neuro: Alert RIght neglect and left gaze preference Clonus at bilateral ankles Not following commands. Generalized tremors Psych: Unable to assess due to mentation.  Assessment/Plan: 1. 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 Physiatrist is providing close team supervision and 24 hour management of active medical problems listed below. Physiatrist and rehab team continue to assess barriers to discharge/monitor patient progress toward functional and medical goals. FIM: Function - Bathing Position: Bed Body parts bathed by patient: Right upper leg Body parts bathed by helper: Right arm, Left arm, Chest, Abdomen, Buttocks, Front perineal area, Left upper leg, Right lower leg, Left lower leg, Back Assist Level: Touching or steadying assistance(Pt > 75%) (Total A of 1)  Function- Upper Body Dressing/Undressing What is the patient wearing?: Pull over shirt/dress Pull over shirt/dress - Perfomed by patient: Put head through opening Pull over shirt/dress - Perfomed by helper: Thread/unthread left sleeve, Thread/unthread right sleeve, Pull shirt over trunk, Put head through opening (pt attempted to put head in, but needed A) Assist Level:   (total A of 1) Function - Lower Body Dressing/Undressing What is the patient wearing?: Pants, Non-skid slipper socks, Ted Hose Position: Bed Pants- Performed by helper: Thread/unthread right pants leg, Thread/unthread left pants leg, Pull pants up/down Non-skid slipper socks- Performed by helper: Don/doff left sock, Don/doff right sock Socks - Performed by helper: Don/doff left sock, Don/doff right sock TED Hose - Performed by helper: Don/doff right TED hose, Don/doff left TED hose Assist for footwear: Dependant Assist for lower body dressing: Touching or steadying assistance (Pt > 75%) (Total A of 1)  Function - Toileting Toileting activity did not occur: No continent bowel/bladder event Toileting steps completed by helper: Adjust clothing after toileting, Performs perineal hygiene, Adjust clothing prior to toileting Assist level: Two helpers  Function - ArchivistToilet Transfers Toilet transfer activity did not occur: Safety/medical concerns Toilet transfer assistive device: Elevated toilet seat/BSC over toilet, Mechanical lift Mechanical lift: Stedy Assist level to toilet: 2 helpers (per [employer],NT report) Assist level to bedside commode (at bedside): 2 helpers Assist level from bedside commode (at bedside): 2 helpers  Function - Chair/bed transfer Chair/bed transfer activity did not occur: Safety/medical concerns Chair/bed transfer method: Squat pivot Chair/bed transfer assist level: Maximal assist (Pt 25 - 49%/lift and lower) Chair/bed transfer assistive device: Armrests Mechanical lift: Stedy Chair/bed transfer details: Manual facilitation for weight shifting, Manual facilitation for placement, Manual facilitation for weight bearing, Tactile cues for initiation, Tactile cues for weight shifting, Tactile cues for sequencing  Function - Locomotion: Wheelchair Type: Manual Max wheelchair distance: 6330ft  Assist Level: Moderate assistance (Pt 50 - 74%) Wheel 50 feet with 2 turns activity  did not occur: Refused Wheel 150 feet activity did not occur: Safety/medical concerns Function - Locomotion:  Ambulation Assistive device: Parallel bars Max distance: 2 Assist level: Maximal assist (Pt 25 - 49%) Walk 10 feet activity did not occur: Safety/medical concerns Walk 50 feet with 2 turns activity did not occur: Safety/medical concerns Walk 150 feet activity did not occur: Safety/medical concerns Walk 10 feet on uneven surfaces activity did not occur: Safety/medical concerns  Function - Comprehension Comprehension: Auditory Comprehension assist level: Understands basic less than 25% of the time/ requires cueing >75% of the time  Function - Expression Expression: Nonverbal Expression assist level: Expresses basis less than 25% of the time/requires cueing >75% of the time.  Function - Social Interaction Social Interaction assist level: Interacts appropriately less than 25% of the time. May be withdrawn or combative.  Function - Problem Solving Problem solving assist level: Solves basic less than 25% of the time - needs direction nearly all the time or does not effectively solve problems and may need a restraint for safety  Function - Memory Memory assist level: Recognizes or recalls less than 25% of the time/requires cueing greater than 75% of the time Patient normally able to recall (first 3 days only): None of the above  Medical Problem List and Plan: 1.  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 DVT Prophylaxis/Anticoagulation: Eliquis 3. Pain Management: Tylenol 4. Mood: Prozac 40 mg daily, Remeron 15 mg daily at bedtime- both of which which will be d/ced due to somnolence, started on Ritalin with some increased alertness -per neuropsych neurologic, not mood issues  determining behavior -family supportive as well, FL2 placed 5. Neuropsych: This patient is capable of making decisions on herown behalf. 6. Skin/Wound Care: Routine skin checks 7. Fluids/Electrolytes/Nutrition: RoutineI&O's , Normalized  BUN and Creat, d/ced IVF at noc  Labs pending May be in part due to Cozaar since the intake appears good per RN, on lower dose may tolerate higher dose if pt can keep up with fluid intake 8.PAF.Cardiac rate control. Continue Eliquis 9.Hypertension.Coreg 25 mg BID,Cozaar 50 mg daily  amlodipine increased to 10mg  on 6/24,  increased cozaar back to 100mg   Hydralizine 10 TID started 6/30, increased to 25mg  on 7/2  Hypertensive crisis this AM  Renal artery U/S ordered 10.Hyperlipidemia. Lipitor 11.  Somnolence- this is in part abulia due to bifrontal infarcts  Ritalin trial- No sig improvement will not continue after discharge 12.  Bradycardia improved off verapamil  13.  Acute lower UTI  Ucx Proteus mirabilis  Continue Keflex 6/29-7/4 14. Myoclonic Jerking  EEG pending  Pt slept with ativan, will consider trial low dose   LOS (Days) 17 A FACE TO FACE EVALUATION WAS PERFORMED  Ankit Karis Juba 06/23/2017, 8:38 AM

## 2017-06-23 NOTE — Plan of Care (Signed)
Problem: RH Balance Goal: LTG: Patient will maintain dynamic sitting balance (OT) LTG:  Patient will maintain dynamic sitting balance with assistance during activities of daily living (OT)  Outcome: Not Met (add Reason) Pt still requiring Max-Total A   Problem: RH Bathing Goal: LTG Patient will bathe with assist, cues/equipment (OT) LTG: Patient will bathe specified number of body parts with assist with/without cues using equipment (position)  (OT)  Outcome: Not Met (add Reason) Pt still requiring Total A   Problem: RH Dressing Goal: LTG Patient will perform upper body dressing (OT) LTG Patient will perform upper body dressing with assist, with/without cues (OT).  Outcome: Not Met (add Reason) Pt still requiring Total A

## 2017-06-23 NOTE — Progress Notes (Signed)
Routine EEG completed, results pending.  Pt had muscle artifact for much of test.

## 2017-06-23 NOTE — Discharge Summary (Signed)
NAMAlmyra Deforest:  Gattuso, Ryann               ACCOUNT NO.:  [id number]  MEDICAL RECORD NO.:  [id number]  LOCATION:  4M05C                        FACILITY:  [facility]  PHYSICIAN:  Erick ColaceAndrew E. Kirsteins, M.D.DATE OF BIRTH:  [birth year]  DATE OF ADMISSION:  06/06/2017 DATE OF DISCHARGE:  06/24/2017                              DISCHARGE SUMMARY   DISCHARGE DIAGNOSES: 1. 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 2. Eliquis for deep vein thrombosis prophylaxis. 3. Depression. 4. Paroxysmal atrial fibrillation. 5. Hypertension. 6. Hyperlipidemia. 7. Bradycardia, resolved. 8. Myoclonic jerking.  HISTORY OF PRESENT ILLNESS:  This is a 71 year old right-handed female with history of hypertension, PAF on Eliquis, left middle cerebral artery infarction in March 2018, received inpatient rehab services, discharged to home, ambulating with a rolling walker.  Presented on June 04, 2017, with mental status changes, recent fall without loss of consciousness.  CT and MRI showed multifocal acute ischemia within the largest area involving the right pericallosal white matter and left frontal operculum.  No hemorrhage or mass effect.  Old right frontal infarct, multiple old cerebellar infarcts.  Echocardiogram with ejection fraction of 65%, grade 1 diastolic dysfunction.  The patient did not receive tPA and remained on Eliquis.  Physical and occupational therapy ongoing.  The patient was admitted for a comprehensive rehab program.  PAST MEDICAL HISTORY:  See discharge diagnoses.  SOCIAL HISTORY:  Lives with family.  Ambulating with a rolling walker prior to admission.  FUNCTIONAL STATUS:  Upon admission to rehab services, max assist, stand pivot transfers, moderate assist supine to sit; max total assist, activities of daily living.  PHYSICAL EXAMINATION:  VITAL SIGNS:  Blood pressure 168/74, pulse 71, temperature 98, respirations  18. GENERAL:  This was an alert female, nonverbal, expressive, greater than receptive language deficits.  Made good eye contact with examiner. HEENT:  Healing abrasions to the nose and face.  Pupils round and reactive to light. NECK:  No JVD. CARDIAC:  Regular rate and rhythm.  No murmur. ABDOMEN:  Soft, nontender.  Good bowel sounds. LUNGS:  Clear to auscultation without wheeze.  REHABILITATION HOSPITAL COURSE:  The patient was admitted to inpatient rehab services with therapies initiated on a 3-hour daily basis, consisting of physical therapy, occupational therapy, speech therapy, and rehabilitation nursing.  The following issues were addressed during the patient's rehabilitation stay.  Pertaining to Ms. Ponzo, right frontal, left frontal operculum infarction remained stable, she continued on Eliquis.  No bleeding episodes. Noted intermittent myoclonic jerking. EEG completed showing mild to moderate diffuse background slowing. No seizure activity. Placed on low-dose Ativan twice daily. Presently maintained on a dysphagia #2 nectar thick liquid diet followed by speech therapy.  Noted depression with Prozac, Remeron, trial of Ritalin that was later discontinued due to somnolence and follow-up per neuropsychology.  Pain management with Tylenol as well as Zanaflex 2 mg at bedtime.  Blood pressures controlled on Cozaar, hydralazine as well as Coreg. Renal ultrasound completed showing no signs of stenosis. Arrangements made to follow up with PCP.  During her hospital course, Proteus UTI, she would complete a course of Keflex June 25, 2017.  The patient received weekly collaborative interdisciplinary team conferences to discuss estimated length of stay, family teaching, any barriers to discharge.  She needed some encouragement to keep her eyes open during sessions, total assist for feeding, 90% of the time with 3 successful trials.  She did show some lack of initiation and attention. She needed  assistance for activities of daily living.  Leaning to the left side perseverates with behavior, total assist with bathing due to perseveration, unable to wash, needing assistance, max assist rolling in the bed.  The patient still needing quite a bit of assistance.  Prior to discharge, her some speculation perhaps need for skilled nursing facility.  Family was willing to provide the necessary assistance at home and discharge planning arranged for June 23, 2017.  DISCHARGE MEDICATIONS:  Included: 1. Norvasc 10 mg p.o. daily. 2. Eliquis 5 mg p.o. b.i.d. 3. Lipitor 20 mg p.o. daily. 4. Coreg 25 mg p.o. b.i.d. 5. Keflex 250 mg p.o. every 8 hours, complete June 25, 2017. 6. Voltaren gel 4 times daily to affected area. 7. Hydralazine 10 mg p.o. every 8 hours. 8. Cozaar 100 mg p.o. daily. 9. Zanaflex 2 mg p.o. at bedtime. 10. Ativan 0.25 mg twice daily 11. Desyrel 50 mg bedtime as needed  DIET:  Dysphagia #2 nectar thick liquid diet.  FOLLOWUP:  The patient would follow up with Dr. Jenelle Mages, medical management.  Appointment to be made, Dr. Claudette Laws, at the outpatient rehab service office as advised; Dr. Roda Shutters, Neurology Services.     Mariam Dollar, P.A.   ______________________________ Erick Colace, M.D.    DA/MEDQ  D:  06/22/2017  T:  06/23/2017  Job:  [id number]  cc:   Erick Colace, M.D. Dr. Jodell Cipro, MD

## 2017-06-23 NOTE — Procedures (Signed)
ELECTROENCEPHALOGRAM REPORT  Date of Study: 06/23/2017  Patient's Name: Christina [name] MRN: [id number] Date of Birth: 13-Mar-1946  Referring Provider: Dr. Maryla MorrowAnkit Patel  Clinical History: This is a 71 year old woman with left ACA stroke, hums tunelessly, tremors, myoclonic jerks.  Medications: acetaminophen (TYLENOL) tablet 650 mg  amLODipine (NORVASC) tablet 10 mg  apixaban (ELIQUIS) tablet 5 mg  atorvastatin (LIPITOR) tablet 20 mg  carvedilol (COREG) tablet 25 mg  cephALEXin (KEFLEX) capsule 250 mg  diclofenac sodium (VOLTAREN) 1 % transdermal gel 2 g  hydrALAZINE (APRESOLINE) tablet 10 mg  losartan (COZAAR) tablet 100 mg  senna-docusate (Senokot-S) tablet 2 tablet  tiZANidine (ZANAFLEX) tablet 2 mg  traZODone (DESYREL) tablet 50 mg   Technical Summary: A multichannel digital EEG recording measured by the international 10-20 system with electrodes applied with paste and impedances below 5000 ohms performed in our laboratory with EKG monitoring in an awake and asleep patient.  Hyperventilation and photic stimulation were not performed.  The digital EEG was referentially recorded, reformatted, and digitally filtered in a variety of bipolar and referential montages for optimal display.    Description: The patient is awake and asleep during the recording. She is noted to be humming and has tremors during the study.  During maximal wakefulness, there is a symmetric, medium voltage 7.5 Hz posterior dominant rhythm that attenuates with eye opening. This is admixed with a small amount of diffuse 4-6 Hz theta and occasional diffuse 2-3 Hz delta slowing of the waking background. There is additional occasional focal delta slowing over the left hemisphere. During drowsiness and sleep, there is an increase in theta and delta slowing of the background with poorly formed vertex waves and sleep spindles seen.  Hyperventilation and photic stimulation were not performed.  There were no epileptiform  discharges or electrographic seizures seen.    EKG lead was unremarkable.  Impression: This awake and asleep EEG is abnormal due to the presence of: 1. Mild to moderate diffuse background slowing 2. Additional occasional focal slowing over the left hemisphere  Clinical Correlation of the above findings indicates diffuse cerebral dysfunction that is non-specific in etiology, and can be seen with hypoxic/ischemic injury, toxic/metabolic encephalopathy, or medication effect. Additional focal slowing over the left hemisphere indicates focal cerebral dysfunction in this region suggestive of underlying structural or physiologic abnormality. The absence of epileptiform discharges does not exclude a clinical diagnosis of epilepsy.  Clinical correlation is advised.   Patrcia DollyKaren Gaylin Osoria, M.D.

## 2017-06-23 NOTE — Progress Notes (Addendum)
Occupational Therapy Discharge Summary  Patient Details  Name: Christina [name] MRN: [id number] Date of Birth: 1946-07-24  Today's Date: 06/23/2017 OT Individual Time: 1001-1100 OT Individual Time Calculation (min): 59 min   Patient has met 3 out of 6 long term goals due to improved activity tolerance and improved attention.  Patient to discharge at overall Total Assist level.  Patient's care partner daughter, has participated in family education with OT to provide the necessary physical and cognitive assistance at discharge.    Reasons goals not met: due to pts persistent lethargy, cognitive deficits, and significant tremor/clonus activity. Daughter has been trained to provide total care  pt will require for BADLs at d/c.   Recommendation:  Patient will benefit from ongoing skilled OT services in home health setting to continue to advance functional skills in the area of BADL.  Equipment: hospital bed, hoyer lift, u sling, TIS  Reasons for discharge: lack of progress toward goals and discharge from hospital  Patient/family agrees with progress made and goals achieved: Yes   Skilled Therapeutic Intervention:  Pt greeted supine in bed. Per RN, exhibited increased agitation this morning. During B/D, pt often holding hands together and bumping forehead (while washing face, pulling shirt over head). Attempted to engage pt via forward/backwards chaining techniques, HOH assist, visual aides, and gentle tactile input/cuing. Pt washing part of chest, and upper Rt leg. Pt initiating elevation of R LE during LB dressing, and threading of R UE into shirt. Tremor activity, attention, and coordination significantly limited pts ability to complete 1 step of any task functionally. Total A for rolling L<R for pericare and dressing as needed. Pt brushing hair on Rt side with HOH. When therapist released assist, pt proceeded to brush forehead. With 2 helpers, pt repositioned for pressure relief on Lt side. Heels  floated. Towels rolled up and placed in bilateral hands for makeshift antispasticity splints. She was left with all needs and bed alarm activated at time of departure.   Oriented pt to time, place, situation.  Used Therapeutic use of music [facility]) for tone mgt during session.  OT Discharge Precautions/Restrictions  Precautions Precautions: Fall Restrictions Weight Bearing Restrictions: No ADL ADL ADL Comments: Please see functional navigator for ADL status  Cognition Overall Cognitive Status: Impaired/Different from baseline Arousal/Alertness: Lethargic Orientation Level: Disoriented X4 Initiating: Impaired Safety/Judgment: Impaired Motor  Motor Motor: Hemiplegia;Motor apraxia Balance Static Sitting Balance Static Sitting - Balance Support: Right upper extremity supported;Left upper extremity supported Static Sitting - Level of Assistance: 2: Max assist Dynamic Sitting Balance Sitting balance - Comments: Not assessed, pt unable to attend to ADL task at EOB to create dynamic demands  Extremity/Trunk Assessment RUE Assessment RUE Assessment: Exceptions to [facility] RUE Tone RUE Tone: Hypertonic LUE Assessment LUE Assessment: Exceptions to [facility] (MAS score 3)   See Function Navigator for Current Functional Status.  Gianlucas Evenson A Julen Rubert 06/23/2017, 7:57 PM

## 2017-06-23 NOTE — Progress Notes (Signed)
Initial Nutrition Assessment  DOCUMENTATION CODES:   Not applicable  INTERVENTION:  Provide Ensure Enlive po BID (thickened to nectar thick consistency), each supplement provides 350 kcal and 20 grams of protein.  Encourage adequate PO intake.   NUTRITION DIAGNOSIS:   Increased nutrient needs related to wound healing as evidenced by estimated needs.  GOAL:   Patient will meet greater than or equal to 90% of their needs  MONITOR:   PO intake, Supplement acceptance, Labs, Weight trends, Skin, I & O's  REASON FOR ASSESSMENT:   Low Braden    ASSESSMENT:   71 y.o. right hand female with history of hypertension, PAF maintained on Eliquis, depression with multiple follow-ups at behavioral health, left middle cerebral artery infarction. Present 06/04/2017 with mental status/non- as well as recent fall without loss of consciousness. CT/MRI showed multifocal acute ischemia within the largest area involving the right pericallosal white matter and left frontal operculum.   Pt with aphasia. No family at bedside. Meal completion has been varied from 30-90% with 30% at lunch today. RD to order Ensure to aid in adequate nutrition as well as in wound healing. Plans for discharge tomorrow. Pt with no weight loss. Pt with no observed significant fat or muscle mass loss. Recommend continuation of nutritional supplements post discharge especially if po intake is inadequate.   Diet Order:  DIET DYS 2 Room service appropriate? Yes; Fluid consistency: Nectar Thick  Skin:  Wound (see comment) (Stage II to buttocks)  Last BM:  7/1  Height:   Ht Readings from Last 1 Encounters:  06/06/17 5\' 3"  (1.6 m)    Weight:   Wt Readings from Last 1 Encounters:  06/11/17 149 lb 0.5 oz (67.6 kg)    Ideal Body Weight:  52.27 kg  BMI:  Body mass index is 26.4 kg/m.  Estimated Nutritional Needs:   Kcal:  1600-1800  Protein:  75-85 grams  Fluid:  1.6 - 1.8 L/day  EDUCATION NEEDS:   No  education needs identified at this time  Roslyn SmilingStephanie Tashari Schoenfelder, MS, RD, LDN Pager # [phone] After hours/ weekend pager # [phone]

## 2017-06-23 NOTE — Progress Notes (Signed)
Physical Therapy Session Note  Patient Details  Name: Christina [name] MRN: [id number] Date of Birth: 04-19-1946  Today's Date: 06/23/2017 PT Individual Time: 1100-1200 PT Individual Time Calculation (min): 60 min   Short Term Goals: Week 2:  PT Short Term Goal 1 (Week 2): =LTGs due to ELOS  Skilled Therapeutic Interventions/Progress Updates:    no indication of pain.  Pt's daughter present for first half of session for family education.  PT instructed pt's daughter in bed mobility for positioning and hygiene.  Provided cues for hand placement and encouraging pt to assist as much as possible.  Pt continues to require total to +2 assist for self care tasks.  Pt's daughter positioned maximove and sling for transfer to tilt in space w/c and PT provided education on differences between [employer] and [place], which daughter verbalized understanding of.  PT also discussed w/c evaluation scheduled for tomorrow and educated daughter on proper positioning in w/c to reduce risk of pain, contractures, and skin breakdown and ultimately increase sitting tolerance.  Pt taken to therapy gym for stimulating environment.  Initiates cup stacking activity <10% of the time.  Max cues for keeping eyes open and hand over hand to initiate activity.  Attempted familiar/functional towel folding task and pt initiates to hold towel but does not complete task.  Returned to room at end of session and positioned in tilt in space w/c with call bell in reach and needs met.   Therapy Documentation Precautions:  Precautions Precautions: Fall Restrictions Weight Bearing Restrictions: No   See Function Navigator for Current Functional Status.   Therapy/Group: Individual Therapy  Earnest Conroy Penven-Crew 06/23/2017, 12:10 PM

## 2017-06-23 NOTE — Progress Notes (Signed)
Social Work Patient ID: Hardin NegusArinda [name], female   DOB: [birth year], 71 y.o.   MRN: [id number]   Daughter did come in for PT session to complete family education. Has now gone home to wait for the equipment delivery. Daughter reports it went well and feels prepared for discharge tomorrow.

## 2017-06-23 NOTE — Progress Notes (Signed)
EEG scheduled for 1pm today.

## 2017-06-23 NOTE — Progress Notes (Signed)
Occupational Therapy Session Note  Patient Details  Name: Christina [name] MRN: [id number] Date of Birth: 1946/12/03  Today's Date: 06/23/2017 OT Individual Time: 1500-1530 OT Individual Time Calculation (min): 30 min    Short Term Goals: Week 2:  OT Short Term Goal 1 (Week 2): LTG=STG 2/2 ELOS  Skilled Therapeutic Interventions/Progress Updates:    1:1. OT provided PROM to BUE for contracture prevention. Pt with increased felxor tone in elbow and decreased ROM in horizontal abduction. Replaced stress balls in pt's BUE with rolled washcloths for improved finger extension/resting hand position. Provided handout for PROM exercises for family members. Exited session with pt semi reclined in bed with call light in reach and all needs met .   Therapy Documentation Precautions:  Precautions Precautions: Fall Restrictions Weight Bearing Restrictions: No  See Function Navigator for Current Functional Status.   Therapy/Group: Individual Therapy  Tonny Branch 06/23/2017, 4:44 PM

## 2017-06-24 ENCOUNTER — Inpatient Hospital Stay (HOSPITAL_COMMUNITY): Payer: Self-pay | Admitting: Physical Therapy

## 2017-06-24 ENCOUNTER — Inpatient Hospital Stay (HOSPITAL_COMMUNITY): Payer: Medicare Other

## 2017-06-24 DIAGNOSIS — R253 Fasciculation: Secondary | ICD-10-CM | POA: Diagnosis not present

## 2017-06-24 DIAGNOSIS — R41841 Cognitive communication deficit: Secondary | ICD-10-CM | POA: Diagnosis not present

## 2017-06-24 DIAGNOSIS — R6889 Other general symptoms and signs: Secondary | ICD-10-CM | POA: Diagnosis not present

## 2017-06-24 DIAGNOSIS — I10 Essential (primary) hypertension: Secondary | ICD-10-CM | POA: Diagnosis not present

## 2017-06-24 DIAGNOSIS — N390 Urinary tract infection, site not specified: Secondary | ICD-10-CM | POA: Diagnosis not present

## 2017-06-24 DIAGNOSIS — I6340 Cerebral infarction due to embolism of unspecified cerebral artery: Secondary | ICD-10-CM | POA: Diagnosis not present

## 2017-06-24 DIAGNOSIS — I639 Cerebral infarction, unspecified: Secondary | ICD-10-CM | POA: Diagnosis not present

## 2017-06-24 DIAGNOSIS — I69351 Hemiplegia and hemiparesis following cerebral infarction affecting right dominant side: Secondary | ICD-10-CM | POA: Diagnosis not present

## 2017-06-24 LAB — CBC
HEMATOCRIT: 34.1 % — AB (ref 36.0–46.0)
Hemoglobin: 10.7 g/dL — ABNORMAL LOW (ref 12.0–15.0)
MCH: 25.8 pg — ABNORMAL LOW (ref 26.0–34.0)
MCHC: 31.4 g/dL (ref 30.0–36.0)
MCV: 82.4 fL (ref 78.0–100.0)
Platelets: 271 10*3/uL (ref 150–400)
RBC: 4.14 MIL/uL (ref 3.87–5.11)
RDW: 13.1 % (ref 11.5–15.5)
WBC: 5.6 10*3/uL (ref 4.0–10.5)

## 2017-06-24 LAB — CREATININE, SERUM
Creatinine, Ser: 1.39 mg/dL — ABNORMAL HIGH (ref 0.44–1.00)
GFR, EST AFRICAN AMERICAN: 43 mL/min — AB (ref >60)
GFR, EST NON AFRICAN AMERICAN: 37 mL/min — AB (ref >60)

## 2017-06-24 MED ORDER — AMLODIPINE BESYLATE 10 MG PO TABS: 10.0000 mg | ORAL_TABLET | Freq: Every day | ORAL | 0 refills | Status: DC

## 2017-06-24 MED ORDER — LOSARTAN POTASSIUM 100 MG PO TABS: 100.0000 mg | ORAL_TABLET | Freq: Every day | ORAL | 0 refills | Status: DC

## 2017-06-24 MED ORDER — TRAZODONE HCL 50 MG PO TABS: 50.0000 mg | ORAL_TABLET | Freq: Every evening | ORAL | 0 refills | Status: AC | PRN

## 2017-06-24 MED ORDER — APIXABAN 5 MG PO TABS: 5.0000 mg | ORAL_TABLET | Freq: Two times a day (BID) | ORAL | 2 refills | Status: AC

## 2017-06-24 MED ORDER — VOLTAREN 1 % TD GEL: 2.0000 g | Freq: Four times a day (QID) | TRANSDERMAL | 1 refills | Status: AC

## 2017-06-24 MED ORDER — TIZANIDINE HCL 2 MG PO TABS: 2.0000 mg | ORAL_TABLET | Freq: Every day | ORAL | 0 refills | Status: AC

## 2017-06-24 MED ORDER — LORAZEPAM 0.5 MG PO TABS: 0.2500 mg | ORAL_TABLET | Freq: Two times a day (BID) | ORAL | 0 refills | Status: DC

## 2017-06-24 MED ORDER — CARVEDILOL 25 MG PO TABS: 25.0000 mg | ORAL_TABLET | Freq: Two times a day (BID) | ORAL | 2 refills | Status: AC

## 2017-06-24 MED ORDER — CEPHALEXIN 250 MG PO CAPS: 250.0000 mg | ORAL_CAPSULE | Freq: Three times a day (TID) | ORAL | 0 refills | Status: DC

## 2017-06-24 MED ORDER — LORAZEPAM 0.5 MG PO TABS
0.2500 mg | ORAL_TABLET | Freq: Two times a day (BID) | ORAL | Status: DC
  Administered 2017-06-24: 0.25 mg via ORAL
  Filled 2017-06-24: qty 1

## 2017-06-24 MED ORDER — ATORVASTATIN CALCIUM 20 MG PO TABS: 20.0000 mg | ORAL_TABLET | Freq: Every day | ORAL | 0 refills | Status: AC

## 2017-06-24 MED ORDER — HYDRALAZINE HCL 10 MG PO TABS: 10.0000 mg | ORAL_TABLET | Freq: Three times a day (TID) | ORAL | 0 refills | Status: DC

## 2017-06-24 NOTE — Progress Notes (Addendum)
Subjective/Complaints: Pt seen laying in bed.  She continues to hum with clonic jerks.  ROS: Unable to assess due to mentation  Objective: Vital Signs: Blood pressure (!) 168/69, pulse 79, temperature 98.6 F (37 C), temperature source Oral, resp. rate 18, height 5' 3"  (1.6 m), weight 67.6 kg (149 lb 0.5 oz), SpO2 98 %. No results found. Results for orders placed or performed during the hospital encounter of 06/06/17 (from the past 72 hour(s))  CBC     Status: Abnormal   Collection Time: 06/24/17  4:13 AM  Result Value Ref Range   WBC 5.6 4.0 - 10.5 K/uL   RBC 4.14 3.87 - 5.11 MIL/uL   Hemoglobin 10.7 (L) 12.0 - 15.0 g/dL   HCT 34.1 (L) 36.0 - 46.0 %   MCV 82.4 78.0 - 100.0 fL   MCH 25.8 (L) 26.0 - 34.0 pg   MCHC 31.4 30.0 - 36.0 g/dL   RDW 13.1 11.5 - 15.5 %   Platelets 271 150 - 400 K/uL  Creatinine, serum     Status: Abnormal   Collection Time: 06/24/17  4:13 AM  Result Value Ref Range   Creatinine, Ser 1.39 (H) 0.44 - 1.00 mg/dL   GFR calc non Af Amer 37 (L) >60 mL/min   GFR calc Af Amer 43 (L) >60 mL/min    Comment: (NOTE) The eGFR has been calculated using the CKD EPI equation. This calculation has not been validated in all clinical situations. eGFR's persistently <60 mL/min signify possible Chronic Kidney Disease.     Gen NAD. Vital signs reviewed HEENT: normocephalic, atraumatic Cardio: RRR and no JVD Resp: CTA B/L and Unlabored GI: BS positive and ND Musc: No Edema. No tenderness Skin:  Intact. Warm and dry Neuro: Alert RIght neglect and left gaze preference Not following commands. Generalized jerking Psych: Unable to assess due to mentation.  Assessment/Plan: 1. 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 Physiatrist is providing close team supervision and 24 hour management of active medical problems listed  below. Physiatrist and rehab team continue to assess barriers to discharge/monitor patient progress toward functional and medical goals. FIM: Function - Bathing Position: Bed Body parts bathed by patient: Right upper leg Body parts bathed by helper: Right arm, Left arm, Chest, Abdomen, Buttocks, Front perineal area, Left upper leg, Right lower leg, Left lower leg, Back Assist Level: Touching or steadying assistance(Pt > 75%) (Total A of 1)  Function- Upper Body Dressing/Undressing What is the patient wearing?: Pull over shirt/dress Pull over shirt/dress - Perfomed by patient: Put head through opening Pull over shirt/dress - Perfomed by helper: Thread/unthread left sleeve, Thread/unthread right sleeve, Pull shirt over trunk, Put head through opening Assist Level:  (total A of 1) Function - Lower Body Dressing/Undressing What is the patient wearing?: Pants, Non-skid slipper socks, Ted Hose Position: Bed Pants- Performed by helper: Thread/unthread right pants leg, Thread/unthread left pants leg, Pull pants up/down Non-skid slipper socks- Performed by helper: Don/doff left sock, Don/doff right sock Socks - Performed by helper: Don/doff left sock, Don/doff right sock TED Hose - Performed by helper: Don/doff right TED hose, Don/doff left TED hose Assist for footwear: Dependant Assist for lower body dressing: Touching or steadying assistance (Pt > 75%) (Total A of 1)  Function - Toileting Toileting activity did not occur: No continent bowel/bladder event Toileting steps completed by helper: Adjust clothing after toileting, Performs perineal hygiene, Adjust  clothing prior to toileting Assist level: Two helpers  Function - Air cabin crew transfer activity did not occur: Safety/medical concerns Toilet transfer assistive device: Elevated toilet seat/BSC over toilet, Mechanical lift Mechanical lift: Stedy Assist level to toilet: 2 helpers (per [employer] report) Assist level to bedside  commode (at bedside): 2 helpers Assist level from bedside commode (at bedside): 2 helpers  Function - Chair/bed transfer Chair/bed transfer activity did not occur: Safety/medical concerns Chair/bed transfer method: Squat pivot Chair/bed transfer assist level: Maximal assist (Pt 25 - 49%/lift and lower) Chair/bed transfer assistive device: Armrests Mechanical lift: Stedy Chair/bed transfer details: Manual facilitation for weight shifting, Manual facilitation for placement, Manual facilitation for weight bearing, Tactile cues for initiation, Tactile cues for weight shifting, Tactile cues for sequencing  Function - Locomotion: Wheelchair Type: Manual Max wheelchair distance: 34f  Assist Level: Moderate assistance (Pt 50 - 74%) Wheel 50 feet with 2 turns activity did not occur: Refused Wheel 150 feet activity did not occur: Safety/medical concerns Function - Locomotion: Ambulation Assistive device: Parallel bars Max distance: 2 Assist level: Maximal assist (Pt 25 - 49%) Walk 10 feet activity did not occur: Safety/medical concerns Walk 50 feet with 2 turns activity did not occur: Safety/medical concerns Walk 150 feet activity did not occur: Safety/medical concerns Walk 10 feet on uneven surfaces activity did not occur: Safety/medical concerns  Function - Comprehension Comprehension: Auditory Comprehension assist level: Understands basic 25 - 49% of the time/ requires cueing 50 - 75% of the time  Function - Expression Expression: Nonverbal Expression assist level: Expresses basis less than 25% of the time/requires cueing >75% of the time.  Function - Social Interaction Social Interaction assist level: Interacts appropriately less than 25% of the time. May be withdrawn or combative.  Function - Problem Solving Problem solving assist level: Solves basic less than 25% of the time - needs direction nearly all the time or does not effectively solve problems and may need a restraint for  safety  Function - Memory Memory assist level: Recognizes or recalls less than 25% of the time/requires cueing greater than 75% of the time Patient normally able to recall (first 3 days only): None of the above  Medical Problem List and Plan: 1.  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  Patient needs a tilt in space for pressure relief.  A positioning back and pressure relief cushion Is also needed due to stage 2 decubitus on sacrum. I have reviewed and concur with the PT evaluate.  Patient cannot safely ambulate with a can or walker.   2. DVT Prophylaxis/Anticoagulation: Eliquis 3. Pain Management: Tylenol 4. Mood: Prozac 40 mg daily, Remeron 15 mg daily at bedtime- both of which which will be d/ced due to somnolence, started on Ritalin with some increased alertness -per neuropsych neurologic, not mood issues determining behavior -family supportive as well, FL2 placed 5. Neuropsych: This patient is capable of making decisions on herown behalf. 6. Skin/Wound Care: Routine skin checks 7. Fluids/Electrolytes/Nutrition: RoutineI&O's , May be in part due to Cozaar since the intake appears good per RN, on lower dose may tolerate higher dose if pt can keep up with fluid intake 8.PAF.Cardiac rate control. Continue Eliquis  9.Hypertension.Coreg 25 mg BID,Cozaar 50 mg daily  amlodipine increased to 45m on 6/24,  increased cozaar back to 1075m Hydralizine 10 TID started 6/30, increased to 2564mn 7/2  Improved this AM, needs further adjustments as outpt  Renal artery U/S pending 10.Hyperlipidemia. Lipitor 11.  Somnolence- this is in part abulia due  to bifrontal infarcts  Ritalin trial- No sig improvement will not continue after discharge 12.  Bradycardia improved off verapamil  13.  Acute lower UTI  Ucx Proteus mirabilis  Continue Keflex 6/29-7/4 14. Myoclonic Jerking  EEG with generalized slowing, nonspecific  Trial 0.25 ativan BID   LOS (Days) 18 A FACE TO FACE EVALUATION WAS PERFORMED  Ankit AniLorie Phenix3/2018, 7:55 AM

## 2017-06-24 NOTE — Progress Notes (Signed)
Social Work  Discharge Note  The overall goal for the admission was met for:   Discharge location: Yes-HOME WITH DAUGHTER WHO CAN PROVIDE 24 HR CARE  Length of Stay: Yes-18 DAYS  Discharge activity level: Yes-MOD/MAX ASSIST LEVEL  Home/community participation: Yes  Services provided included: MD, RD, PT, OT, SLP, RN, CM, Pharmacy, Neuropsych and SW  Financial Services: Medicare and Medicaid  Follow-up services arranged: Home Health: [facility]-PT,OT,SP, DME: [place] [facility], [facility] and Patient/Family request agency HH: PREF [facility] HAD BEFORE, DME: HAS OTHER DME FROM [facility]  Comments (or additional information):DAUGHTER HAS BEEN HERE AND COMPLETED FAMILY EDUCATION. AWARE OF PT'S NEED FOR 24 HR PHYSICAL CARE. HAS TAKEN A LEAVE FROM WORK FOR TWO MONTHS TO GET PCS IN PLACE AND HIRE ASSISTANCE FOR MOM. PCS PAPERWORK FAXED IN AND PT PLACED ON THE CAPS WAITING LIST. AMBULANCE TRANSPORTED TO HOME.  Patient/Family verbalized understanding of follow-up arrangements: Yes  Individual responsible for coordination of the follow-up plan: [name]-DAUGHTER  Confirmed correct DME delivered: Elease Hashimoto 06/24/2017    Elease Hashimoto

## 2017-06-24 NOTE — Progress Notes (Signed)
Preliminary results by tech - Renal Duple Completed. Technically challenged/limited study due to patient inability to cooperate. The renal arteries appear patent without significant hemodynamically stenosis noted.  Marilynne Halstedita Meliton Samad, BS, RDMS, RVT

## 2017-06-24 NOTE — Progress Notes (Signed)
Physical Therapy Discharge Summary  Patient Details  Name: Christina [name] MRN: [id number] Date of Birth: 15-Mar-1946  Today's Date: 06/24/2017 PT Individual Time: 0930-1100 PT Individual Time Calculation (min): 90 min    Patient has met 3 of 4 long term goals due to improved attention.  Patient to discharge at a wheelchair level Total Assist.   Patient's care partner is independent to provide the necessary physical assistance at discharge.  Reasons goals not met: Pt continues to demonstrate decreased initiation for any dynamic task in sitting requiring hand/over to perform with up to max assist for balance.   Recommendation:  Patient will benefit from ongoing skilled PT services in home health setting to continue to advance safe functional mobility, address ongoing impairments in cognition, postural control, strength, and coordination, and minimize fall risk.  Equipment: consult for custom tilt in space w/c on 06/24/17  Reasons for discharge: lack of progress toward goals and discharge from hospital  Patient/family agrees with progress made and goals achieved: Yes   Skilled PT Intervention: Session focus on d/c assessment and seating assessment with ATP.  Pt continues to require total assist for all self care and mobility.  Initiates <25% of the time for functional/familiar tasks.  Pt does demonstrate increase in consistent focused attention, does not sustain attention >10 seconds.  Pt able to sit edge of mat x20 minutes with close supervision>mod assist for trunk control.  Pt returned to room at end of session and positioned in tilt in space with call bell in reach and needs met.   PT Discharge Precautions/Restrictions Precautions Precautions: Fall Restrictions Weight Bearing Restrictions: No Pain Pain Assessment Faces Pain Scale: No hurt Vision/Perception  Vision - Assessment Alignment/Gaze Preference: Gaze left (pt able to scan past midline ) Perception Perception:  Impaired Inattention/Neglect: Does not attend to right side of body;Does not attend to right visual field Praxis Praxis: Impaired Praxis Impairment Details: Ideation;Initiation;Ideomotor;Perseveration;Motor planning  Cognition Overall Cognitive Status: Impaired/Different from baseline Arousal/Alertness: Awake/alert Orientation Level:  (responds to name) Attention: Focused;Sustained Focused Attention: Impaired Sustained Attention: Impaired Memory: Impaired Awareness: Impaired Problem Solving: Impaired Initiating: Impaired Behaviors: Restless;Perseveration Safety/Judgment: Impaired Sensation Sensation Light Touch: Impaired by gross assessment (does not respond to tactile input in BLEs) Coordination Gross Motor Movements are Fluid and Coordinated: No Fine Motor Movements are Fluid and Coordinated: No Motor  Motor Motor: Hemiplegia;Motor apraxia;Motor perseverations;Motor impersistence  Mobility Bed Mobility Bed Mobility: Rolling Right;Rolling Left;Sit to Supine;Supine to Sit Rolling Right: 2: Max assist Rolling Left: 2: Max assist Supine to Sit: 2: Max assist Sit to Supine: 2: Max assist Transfers Sit to Stand: 1: +1 Total assist Squat Pivot Transfers: 2: Max assist Locomotion  Ambulation Ambulation: No Gait Gait: No Stairs / Additional Locomotion Stairs: No Wheelchair Mobility Wheelchair Mobility: No  Trunk/Postural Assessment  Cervical Assessment Cervical Assessment: Within Functional Limits Thoracic Assessment Thoracic Assessment: Exceptions to [facility] (forward rounded shoulders) Lumbar Assessment Lumbar Assessment: Exceptions to [facility] (preference for posterior pelvic tilt) Postural Control Postural Control: Deficits on evaluation Righting Reactions: absent Protective Responses: insufficient   Balance Balance Balance Assessed: Yes Static Sitting Balance Static Sitting - Balance Support: Left upper extremity supported Static Sitting - Level of Assistance: 4: Min  assist;3: Mod assist Dynamic Sitting Balance Sitting balance - Comments: Not assessed, pt unable to attend to ADL task at EOB to create dynamic demands  Static Standing Balance Static Standing - Level of Assistance: Not tested (comment) Extremity Assessment      RLE Assessment RLE Assessment:  (does not  move to command, but does move volitionally against gravity) LLE Assessment LLE Assessment:  (does not move to command or volitionally)   See Function Navigator for Current Functional Status.  Abbiegail Landgren E Penven-Crew 06/24/2017, 12:23 PM

## 2017-06-24 NOTE — Progress Notes (Signed)
Speech Language Pathology Discharge Summary  Patient Details  Name: Christina [name] MRN: [id number] Date of Birth: [birth year]  Patient has met 4 of 4 long term goals.  Patient to discharge at [facility] Max;Total level.   Reasons goals not met: N/A   Clinical Impression/Discharge Summary: Patient has made minimal progress and has met 4 of 4 LTG's this admission. Currently, patient is consuming Dys. 2 textures with nectar-thick liquids via tsp without overt s/s of aspiration. Patient continues to demonstrate intermittent overt s/s of aspiration with thin liquid trials due to inconsistent timing of swallow initiation. Patient continues to be nonverbal and cannot express her wants/needs via multimodal communication. Patient also requires Max-Total A to complete all functional tasks due to severely impaired initiation and sustained attention to tasks. Family education is complete and patient will discharge home with 24 hour supervision from her daughter. Patient would benefit from f/u home health services in order to maximize her swallowing and cognitive-linguistic function and overall functional independence in order to reduce caregiver burden.   Care Partner:  Caregiver Able to Provide Assistance: Yes  Type of Caregiver Assistance: Physical;Cognitive  Recommendation:  Home Health SLP;24 hour supervision/assistance  Rationale for SLP Follow Up: Maximize functional communication;Maximize cognitive function and independence;Maximize swallowing safety;Reduce caregiver burden   Equipment: Thickener, suction    Reasons for discharge: Discharged from hospital   Patient/Family Agrees with Progress Made and Goals Achieved: Yes     [place], [place] 06/24/2017, 6:56 AM
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
## 2017-06-27 ENCOUNTER — Telehealth: Payer: Self-pay | Admitting: Physical Medicine & Rehabilitation

## 2017-06-27 ENCOUNTER — Telehealth: Payer: Self-pay | Admitting: *Deleted

## 2017-06-27 NOTE — Telephone Encounter (Signed)
JULIE SPEECH PATH WITH [facility] CALLS AND WANTS VERBAL ORDER FOR 2 X A WEEK X 4 WEEKS--[phone]  SHE ALSO CALLED BACK A SECOND TIME TO ADVISE THAT SHE IS HAVING A LEVEL ONE INTERACTION  WITH HER MEDS SYMVASTIN - SHE WAS TOLD BY PHYSICIAN SHE SHOULD STAY ON IT EVEN IF HAVING REACTION - WANTS ADVICE

## 2017-06-27 NOTE — Telephone Encounter (Signed)
Raynelle FanningJulie, ST, [facility] for verbal orders for speech therapy 2week4.  Returned phone call and gave verbal orders per office protocol

## 2017-06-27 NOTE — Telephone Encounter (Signed)
The level 1 interaction is between simvastatin and verapamil....Marland Kitchen.Marland Kitchen.A Level 3 interaction is also present with Voltaren gel and eliquis.....FYI...Marland Kitchen.otherwise verbal orders were given per office protocol

## 2017-06-30 ENCOUNTER — Telehealth: Payer: Self-pay

## 2017-06-30 ENCOUNTER — Telehealth: Payer: Self-pay | Admitting: Family Medicine

## 2017-06-30 NOTE — Telephone Encounter (Signed)
[facility] inquire about the status of the PT eval. Spoke to Christina [name], PT who stated that she assessed the patient at home on 06/27/17. Her daughter is the primary caregiver. She stated that the daughter can transfer her mother independently from bed to chair with picking up and holding her mother and then pivot transfer. She noted that they have a wheelchair in the home.  [facility]. Christina [name] stated that she is not able to transfer her mother into her truck.Christina [name] stated that she is able to drive her mother to the clinic but needs assistance getting her mother in the car/truck.   She said that she is afraid of dropping her mother or injuring herself.  She stated that she is using the hoyer lift for transfers. She noted that there are no steps into the home and a wheelchair would have access directly into the home but they are waiting for the new wheelchair.Marland Kitchen. Christina [name] noted that her mother was fit for a custom chair prior to discharge from the hospital.  Christina [name] is aware that  referrals for PCS and CAP have been made.  Provided Christina [name] with the phone # for Medicaid transportation assessment # [phone] and explained that an assessment needs to be done over the phone prior to enrollment with the program. She said that she would call today. Explained that the program requires a couple of days notice when scheduling a ride.  Also explained that there is an option for [employer] transportation and this office can provide assistance with the application as needed.  Christina [name] stated that her mother is not experiencing any " medical problems" right now. She said that she is giving her mother all of her medications. Reviewed the importance of safety precautions when transferring as well as the importance of  Changing her mother's position in bed to prevent pressure ulcers.  Christina [name]  was agreeable to rescheduling her  mother's appointment to 07/17/17 @ 1330 to allow time for transportation resources to be put in place.

## 2017-06-30 NOTE — Telephone Encounter (Signed)
According to her daughter, her mom had a stroke and she does not have a chair or wheelchair to transport her mother to the appt, can you please advice to see what she can do or if you can auth a wheelchair for her, please follow up

## 2017-06-30 NOTE — Telephone Encounter (Signed)
I called Pt to confirm appt for 07/01/10 but, according to her daughter, her mom had a stroke and she does not have a chair or wheelchair to transport her mother to the appt, can you please call to see what she can do or if you can auth a wheelchair for her, please follow up

## 2017-06-30 NOTE — Telephone Encounter (Signed)
S[place] [facility] [facility]  Attention Wynonia SoursMichele Hamilton - fax # [phone].

## 2017-07-01 ENCOUNTER — Inpatient Hospital Stay: Payer: Self-pay | Admitting: Family Medicine

## 2017-07-02 ENCOUNTER — Telehealth: Payer: Self-pay | Admitting: Family Medicine

## 2017-07-02 NOTE — Telephone Encounter (Signed)
done

## 2017-07-02 NOTE — Telephone Encounter (Signed)
At this point no evidence of interaction seen COntinue this until she sees PCP or Cardiologist

## 2017-07-02 NOTE — Telephone Encounter (Signed)
[facility] and message was left for Wynonia SoursMichele Hamilton to return my call in order to notify me if she had received our fax which included Ms. Wrinkle's application for CAP services.

## 2017-07-03 ENCOUNTER — Telehealth: Payer: Self-pay | Admitting: *Deleted

## 2017-07-03 NOTE — Telephone Encounter (Signed)
Home Health nurse called and left a message encouraging us to move up patient;'s hospital follow up appt.  She says patient is appearing to be very spaced out. Home Health nurse is hoping we could reconcile her medications to see if they may be causing some of the problem.  I reviewed patients chart. Patient was advised to make a hospital f/u but it has not happened yet.  Patient does not have a set appointment with Dr. Wynn BankerKirsteins.  Please advise

## 2017-07-03 NOTE — Telephone Encounter (Signed)
Patient was like this in the hospital. I doubt that there is any change. She has severe cognitive deficits, I can see the patient if the family wants to make an appointment.

## 2017-07-04 NOTE — Telephone Encounter (Signed)
Attempted to call home health nurse and was unsuccessful due to incorrect documented phone number, called patient and asked if they can have home health nurse call back with correct information

## 2017-07-04 NOTE — Telephone Encounter (Signed)
Contacted speech therapist and advised her to encourage family to call and make an appointment if they deem it necessary

## 2017-07-07 ENCOUNTER — Telehealth: Payer: Self-pay | Admitting: Family Medicine

## 2017-07-07 NOTE — Telephone Encounter (Signed)
[facility] to [facility] in regards to Ms. Feagan's application for CAP services. Left message for Marcello MooresJasmine Buxton [phone] to return my call to inform me if they received Ms. Skillin's application and to verify the application process time.

## 2017-07-10 ENCOUNTER — Other Ambulatory Visit: Payer: Self-pay | Admitting: Family Medicine

## 2017-07-10 DIAGNOSIS — E2839 Other primary ovarian failure: Secondary | ICD-10-CM

## 2017-07-15 ENCOUNTER — Other Ambulatory Visit: Payer: Self-pay

## 2017-07-15 ENCOUNTER — Ambulatory Visit
Admission: RE | Admit: 2017-07-15 | Discharge: 2017-07-15 | Disposition: A | Discharge status: Home / Self Care | Payer: Medicare Other | Source: Ambulatory Visit | Attending: Family Medicine | Admitting: Family Medicine

## 2017-07-15 DIAGNOSIS — E2839 Other primary ovarian failure: Secondary | ICD-10-CM

## 2017-07-16 ENCOUNTER — Emergency Department (HOSPITAL_COMMUNITY): Payer: Medicare Other

## 2017-07-16 ENCOUNTER — Inpatient Hospital Stay (HOSPITAL_COMMUNITY)
Admission: EM | Admit: 2017-07-16 | Discharge: 2017-07-19 | DRG: 871 | Disposition: A | Discharge status: Skilled Nursing Facility | Payer: Medicare Other | Attending: Internal Medicine | Admitting: Internal Medicine

## 2017-07-16 ENCOUNTER — Encounter (HOSPITAL_COMMUNITY): Payer: Self-pay | Admitting: Internal Medicine

## 2017-07-16 DIAGNOSIS — R400 Somnolence: Secondary | ICD-10-CM

## 2017-07-16 DIAGNOSIS — N390 Urinary tract infection, site not specified: Secondary | ICD-10-CM | POA: Diagnosis not present

## 2017-07-16 DIAGNOSIS — N10 Acute tubulo-interstitial nephritis: Secondary | ICD-10-CM

## 2017-07-16 DIAGNOSIS — Z79899 Other long term (current) drug therapy: Secondary | ICD-10-CM | POA: Diagnosis not present

## 2017-07-16 DIAGNOSIS — I10 Essential (primary) hypertension: Secondary | ICD-10-CM | POA: Diagnosis not present

## 2017-07-16 DIAGNOSIS — Z6831 Body mass index (BMI) 31.0-31.9, adult: Secondary | ICD-10-CM

## 2017-07-16 DIAGNOSIS — R739 Hyperglycemia, unspecified: Secondary | ICD-10-CM | POA: Diagnosis present

## 2017-07-16 DIAGNOSIS — I129 Hypertensive chronic kidney disease with stage 1 through stage 4 chronic kidney disease, or unspecified chronic kidney disease: Secondary | ICD-10-CM | POA: Diagnosis present

## 2017-07-16 DIAGNOSIS — D649 Anemia, unspecified: Secondary | ICD-10-CM | POA: Diagnosis present

## 2017-07-16 DIAGNOSIS — I69320 Aphasia following cerebral infarction: Secondary | ICD-10-CM

## 2017-07-16 DIAGNOSIS — N342 Other urethritis: Secondary | ICD-10-CM

## 2017-07-16 DIAGNOSIS — E785 Hyperlipidemia, unspecified: Secondary | ICD-10-CM | POA: Diagnosis present

## 2017-07-16 DIAGNOSIS — N189 Chronic kidney disease, unspecified: Secondary | ICD-10-CM | POA: Diagnosis not present

## 2017-07-16 DIAGNOSIS — N179 Acute kidney failure, unspecified: Secondary | ICD-10-CM | POA: Diagnosis not present

## 2017-07-16 DIAGNOSIS — I69954 Hemiplegia and hemiparesis following unspecified cerebrovascular disease affecting left non-dominant side: Secondary | ICD-10-CM

## 2017-07-16 DIAGNOSIS — N183 Chronic kidney disease, stage 3 (moderate): Secondary | ICD-10-CM | POA: Diagnosis present

## 2017-07-16 DIAGNOSIS — I480 Paroxysmal atrial fibrillation: Secondary | ICD-10-CM | POA: Diagnosis present

## 2017-07-16 DIAGNOSIS — R4182 Altered mental status, unspecified: Secondary | ICD-10-CM | POA: Diagnosis present

## 2017-07-16 DIAGNOSIS — Z87891 Personal history of nicotine dependence: Secondary | ICD-10-CM | POA: Diagnosis not present

## 2017-07-16 DIAGNOSIS — M549 Dorsalgia, unspecified: Secondary | ICD-10-CM

## 2017-07-16 DIAGNOSIS — N12 Tubulo-interstitial nephritis, not specified as acute or chronic: Secondary | ICD-10-CM | POA: Diagnosis present

## 2017-07-16 DIAGNOSIS — E860 Dehydration: Secondary | ICD-10-CM | POA: Diagnosis present

## 2017-07-16 DIAGNOSIS — A419 Sepsis, unspecified organism: Secondary | ICD-10-CM | POA: Diagnosis not present

## 2017-07-16 DIAGNOSIS — M25559 Pain in unspecified hip: Secondary | ICD-10-CM

## 2017-07-16 DIAGNOSIS — E43 Unspecified severe protein-calorie malnutrition: Secondary | ICD-10-CM | POA: Diagnosis present

## 2017-07-16 DIAGNOSIS — Z7901 Long term (current) use of anticoagulants: Secondary | ICD-10-CM

## 2017-07-16 DIAGNOSIS — Z8673 Personal history of transient ischemic attack (TIA), and cerebral infarction without residual deficits: Secondary | ICD-10-CM | POA: Diagnosis not present

## 2017-07-16 DIAGNOSIS — A4159 Other Gram-negative sepsis: Secondary | ICD-10-CM | POA: Diagnosis present

## 2017-07-16 DIAGNOSIS — I959 Hypotension, unspecified: Secondary | ICD-10-CM | POA: Diagnosis present

## 2017-07-16 HISTORY — DX: Cystitis, unspecified without hematuria: N30.90

## 2017-07-16 HISTORY — DX: Hyperlipidemia, unspecified: E78.5

## 2017-07-16 LAB — APTT: APTT: 42 s — AB (ref 24–36)

## 2017-07-16 LAB — PROTIME-INR
INR: 1.9
Prothrombin Time: 22 seconds — ABNORMAL HIGH (ref 11.4–15.2)

## 2017-07-16 LAB — I-STAT CG4 LACTIC ACID, ED
LACTIC ACID, VENOUS: 2.01 mmol/L — AB (ref 0.5–1.9)
LACTIC ACID, VENOUS: 1.06 mmol/L (ref 0.5–1.9)

## 2017-07-16 LAB — URINALYSIS, ROUTINE W REFLEX MICROSCOPIC
Bilirubin Urine: NEGATIVE
GLUCOSE, UA: NEGATIVE mg/dL
KETONES UR: NEGATIVE mg/dL
Nitrite: NEGATIVE
PH: 6 (ref 5.0–8.0)
Protein, ur: 100 mg/dL — AB
SPECIFIC GRAVITY, URINE: 1.014 (ref 1.005–1.030)

## 2017-07-16 LAB — CBC WITH DIFFERENTIAL/PLATELET
Basophils Absolute: 0 10*3/uL (ref 0.0–0.1)
Basophils Relative: 0 %
EOS ABS: 0.1 10*3/uL (ref 0.0–0.7)
Eosinophils Relative: 1 %
HCT: 26.4 % — ABNORMAL LOW (ref 36.0–46.0)
Hemoglobin: 8.6 g/dL — ABNORMAL LOW (ref 12.0–15.0)
Lymphocytes Relative: 8 %
Lymphs Abs: 0.8 10*3/uL (ref 0.7–4.0)
MCH: 26.4 pg (ref 26.0–34.0)
MCHC: 32.6 g/dL (ref 30.0–36.0)
MCV: 81 fL (ref 78.0–100.0)
MONOS PCT: 9 %
Monocytes Absolute: 0.8 10*3/uL (ref 0.1–1.0)
NEUTROS ABS: 8.2 10*3/uL — AB (ref 1.7–7.7)
NEUTROS PCT: 82 %
PLATELETS: 227 10*3/uL (ref 150–400)
RBC: 3.26 MIL/uL — AB (ref 3.87–5.11)
RDW: 14.2 % (ref 11.5–15.5)
WBC: 9.9 10*3/uL (ref 4.0–10.5)

## 2017-07-16 LAB — I-STAT TROPONIN, ED: Troponin i, poc: 0.04 ng/mL (ref 0.00–0.08)

## 2017-07-16 LAB — COMPREHENSIVE METABOLIC PANEL
ALK PHOS: 55 U/L (ref 38–126)
ALT: 44 U/L (ref 14–54)
ANION GAP: 9 (ref 5–15)
AST: 75 U/L — ABNORMAL HIGH (ref 15–41)
Albumin: 2.7 g/dL — ABNORMAL LOW (ref 3.5–5.0)
BUN: 35 mg/dL — ABNORMAL HIGH (ref 6–20)
CALCIUM: 8.6 mg/dL — AB (ref 8.9–10.3)
CO2: 20 mmol/L — AB (ref 22–32)
Chloride: 106 mmol/L (ref 101–111)
Creatinine, Ser: 1.66 mg/dL — ABNORMAL HIGH (ref 0.44–1.00)
GFR calc non Af Amer: 30 mL/min — ABNORMAL LOW (ref >60)
GFR, EST AFRICAN AMERICAN: 35 mL/min — AB (ref >60)
Glucose, Bld: 127 mg/dL — ABNORMAL HIGH (ref 65–99)
POTASSIUM: 3.5 mmol/L (ref 3.5–5.1)
SODIUM: 135 mmol/L (ref 135–145)
TOTAL PROTEIN: 5.6 g/dL — AB (ref 6.5–8.1)
Total Bilirubin: 0.7 mg/dL (ref 0.3–1.2)

## 2017-07-16 LAB — CBG MONITORING, ED: GLUCOSE-CAPILLARY: 153 mg/dL — AB (ref 65–99)

## 2017-07-16 LAB — LACTIC ACID, PLASMA: LACTIC ACID, VENOUS: 1.2 mmol/L (ref 0.5–1.9)

## 2017-07-16 LAB — PROCALCITONIN: Procalcitonin: 0.19 ng/mL

## 2017-07-16 MED ORDER — CEFTRIAXONE SODIUM 1 G IJ SOLR
1.0000 g | Freq: Once | INTRAMUSCULAR | Status: AC
  Administered 2017-07-16: 1 g via INTRAVENOUS
  Filled 2017-07-16: qty 10

## 2017-07-16 MED ORDER — ACETAMINOPHEN 650 MG RE SUPP: 650.0000 mg | Freq: Four times a day (QID) | RECTAL | Status: DC | PRN

## 2017-07-16 MED ORDER — ATORVASTATIN CALCIUM 20 MG PO TABS
20.0000 mg | ORAL_TABLET | ORAL | Status: DC
  Administered 2017-07-17 – 2017-07-19 (×3): 20 mg via ORAL
  Filled 2017-07-16 (×3): qty 1

## 2017-07-16 MED ORDER — ONDANSETRON HCL 4 MG/2ML IJ SOLN: 4.0000 mg | Freq: Four times a day (QID) | INTRAMUSCULAR | Status: DC | PRN

## 2017-07-16 MED ORDER — DEXTROSE 5 % IV SOLN
1.0000 g | INTRAVENOUS | Status: DC
  Administered 2017-07-17 – 2017-07-19 (×3): 1 g via INTRAVENOUS
  Filled 2017-07-16 (×3): qty 1

## 2017-07-16 MED ORDER — TIZANIDINE HCL 4 MG PO TABS
2.0000 mg | ORAL_TABLET | Freq: Every day | ORAL | Status: DC
Start: 2017-07-16 — End: 2017-07-19
  Administered 2017-07-17 – 2017-07-18 (×2): 2 mg via ORAL
  Filled 2017-07-16 (×2): qty 1

## 2017-07-16 MED ORDER — LORAZEPAM 2 MG/ML IJ SOLN
1.0000 mg | Freq: Once | INTRAMUSCULAR | Status: AC
  Administered 2017-07-16: 1 mg via INTRAVENOUS
  Filled 2017-07-16: qty 1

## 2017-07-16 MED ORDER — ONDANSETRON HCL 4 MG PO TABS: 4.0000 mg | ORAL_TABLET | Freq: Four times a day (QID) | ORAL | Status: DC | PRN

## 2017-07-16 MED ORDER — APIXABAN 5 MG PO TABS
5.0000 mg | ORAL_TABLET | Freq: Two times a day (BID) | ORAL | Status: DC
  Administered 2017-07-17 – 2017-07-18 (×3): 5 mg via ORAL
  Filled 2017-07-16 (×3): qty 1

## 2017-07-16 MED ORDER — CARVEDILOL 25 MG PO TABS
25.0000 mg | ORAL_TABLET | Freq: Two times a day (BID) | ORAL | Status: DC
  Administered 2017-07-17 – 2017-07-19 (×6): 25 mg via ORAL
  Filled 2017-07-16 (×6): qty 1

## 2017-07-16 MED ORDER — LATANOPROST 0.005 % OP SOLN
1.0000 [drp] | Freq: Every day | OPHTHALMIC | Status: DC
  Administered 2017-07-16 – 2017-07-18 (×3): 1 [drp] via OPHTHALMIC
  Filled 2017-07-16: qty 2.5

## 2017-07-16 MED ORDER — SODIUM CHLORIDE 0.9 % IV BOLUS (SEPSIS)
1000.0000 mL | Freq: Once | INTRAVENOUS | Status: AC
  Administered 2017-07-16: 1000 mL via INTRAVENOUS

## 2017-07-16 MED ORDER — CHLORHEXIDINE GLUCONATE 0.12 % MT SOLN
15.0000 mL | Freq: Two times a day (BID) | OROMUCOSAL | Status: DC
  Administered 2017-07-17 – 2017-07-19 (×5): 15 mL via OROMUCOSAL
  Filled 2017-07-16 (×4): qty 15

## 2017-07-16 MED ORDER — LORAZEPAM 0.5 MG PO TABS
0.2500 mg | ORAL_TABLET | Freq: Two times a day (BID) | ORAL | Status: DC
Start: 2017-07-16 — End: 2017-07-19
  Administered 2017-07-17 – 2017-07-19 (×5): 0.25 mg via ORAL
  Filled 2017-07-16 (×5): qty 1

## 2017-07-16 MED ORDER — ORAL CARE MOUTH RINSE
15.0000 mL | Freq: Two times a day (BID) | OROMUCOSAL | Status: DC
  Administered 2017-07-17 – 2017-07-19 (×4): 15 mL via OROMUCOSAL

## 2017-07-16 MED ORDER — SODIUM CHLORIDE 0.9 % IV SOLN
INTRAVENOUS | Status: DC
  Administered 2017-07-16 – 2017-07-18 (×3): via INTRAVENOUS

## 2017-07-16 MED ORDER — DEXTROSE 5 % IV SOLN
2.0000 g | Freq: Once | INTRAVENOUS | Status: DC
  Filled 2017-07-16: qty 2

## 2017-07-16 MED ORDER — TRAZODONE HCL 50 MG PO TABS
50.0000 mg | ORAL_TABLET | Freq: Every evening | ORAL | Status: DC | PRN
  Administered 2017-07-18: 50 mg via ORAL
  Filled 2017-07-16: qty 1

## 2017-07-16 MED ORDER — DOCUSATE SODIUM 100 MG PO CAPS
100.0000 mg | ORAL_CAPSULE | Freq: Two times a day (BID) | ORAL | Status: DC
  Administered 2017-07-17 – 2017-07-19 (×5): 100 mg via ORAL
  Filled 2017-07-16 (×5): qty 1

## 2017-07-16 MED ORDER — ACETAMINOPHEN 325 MG PO TABS
650.0000 mg | ORAL_TABLET | Freq: Four times a day (QID) | ORAL | Status: DC | PRN
  Administered 2017-07-18 – 2017-07-19 (×3): 650 mg via ORAL
  Filled 2017-07-16 (×3): qty 2
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
## 2017-07-16 NOTE — Progress Notes (Signed)
Pt admitted to 5w18 from ED. Pt is only alert to painful stimuli and moans when she is turned. Pt's right upper leg is very swollen and bruised on back of leg. Pt moans when that leg is touched. Pt's left arm is contracted. Pt placed on high fall precautions and placed on bed alarm. Will continue to monitor pt. Nelda MarseilleJenny Thacker, RN

## 2017-07-16 NOTE — ED Notes (Signed)
Admitting MD at bedside.

## 2017-07-16 NOTE — ED Notes (Signed)
100% on room air.

## 2017-07-16 NOTE — ED Provider Notes (Addendum)
[facility]-EMER[facility]ENCY DEPT Provider Note   CSN: [id number] Arrival date & time: 07/16/17  1508     History   Chief Complaint Chief Complaint  Patient presents with  . Hypotension  . Unresponsive    HPI Christina [name] is a 71 y.o. female hx of HTN, afib not on blood thinners, Previous stroke who presented with mental status, hypotension. Patient lives at home with her daughter. At baseline, patient only speaks a couple words and and just listens to music and needs help with ambulation. Patient went to her bone scan yesterday and then after she came home, the daughter noticed that she was altered. Patient has been just moaning and not responding. Daughter also noticed that her color seemed pale. Visiting nurse came and got blood pressure of 70/50 so EMS was called. Patient unable to give any history and is just moaning at bedside.  The history is provided by a relative.    Past Medical History:  Diagnosis Date  . Hypertension   . PAF (paroxysmal atrial fibrillation) (HCC)   . Stroke [facility])     Patient Active Problem List   Diagnosis Date Noted  . Sepsis (HCC) 07/16/2017  . Blood pressure alteration   . Jerking   . Acute lower UTI   . Hypertensive crisis   . Broca's aphasia   . Cognitive communication disorder   . Cerebellar infarct (HCC) 06/06/2017  . 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 Uncontrolled hypertension 06/04/2017  . Fall at home 06/04/2017  . Altered mental status 06/04/2017  . History of CVA in adulthood 06/04/2017  . AF (paroxysmal atrial fibrillation) (HCC) 06/04/2017  . CKD (chronic kidney disease), stage III 06/04/2017  . Depression 06/04/2017  . Aphasia due to acute stroke (HCC)   . Encephalopathy, hypertensive 05/07/2017  . Enterococcus UTI 05/07/2017  . Hypertensive urgency 05/02/2017  . Hypertensive emergency 05/01/2017  . History of suicidal ideation   . Hypokalemia   . Suicide ideation   . Sleep disturbance   . PAF  (paroxysmal atrial fibrillation) (HCC)   . Labile blood pressure   . AKI (acute kidney injury) (HCC)   . Stage 3 chronic kidney disease   . Acute blood loss anemia   . Benign essential HTN   . Adjustment disorder with mixed anxiety and depressed mood   . 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 Left hemiparesis (HCC)   . Primary osteoarthritis of left knee   . Left knee pain   . Diplopia   . Acute cystitis without hematuria   . Acute CVA (cerebrovascular accident) (HCC)   . TIA (transient ischemic attack) 03/14/2017  . History of CVA (cerebrovascular accident) 03/14/2017  . HTN (hypertension) 03/14/2017  . HLD (hyperlipidemia) 03/14/2017  . 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 ABDOMINAL HYSTERECTOMY    . EYE SURGERY      OB History    No data available       Home Medications    Prior to Admission medications   Medication Sig Start Date End Date Taking? Authorizing Provider  amLODipine (NORVASC) 10 MG tablet Take 1 tablet (10 mg total) by mouth daily. 06/24/17  Yes Angiulli, Mcarthur Rossetti, PA-C  apixaban (ELIQUIS) 5 MG TABS tablet Take 1 tablet (5 mg total) by mouth 2 (two) times daily. 06/24/17  Yes Angiulli, Mcarthur Rossetti, PA-C  atorvastatin (LIPITOR) 20 MG tablet Take 1 tablet (20 mg total) by mouth  daily at 6 PM. Patient taking differently: Take 20 mg by mouth every morning.  06/24/17  Yes Angiulli, Mcarthur Rossettianiel J, PA-C  carvedilol (COREG) 25 MG tablet Take 1 tablet (25 mg total) by mouth 2 (two) times daily with a meal. 06/24/17  Yes Angiulli, Mcarthur Rossettianiel J, PA-C  hydrALAZINE (APRESOLINE) 10 MG tablet Take 1 tablet (10 mg total) by mouth every 8 (eight) hours. 06/24/17  Yes Angiulli, Mcarthur Rossettianiel J, PA-C  latanoprost (XALATAN) 0.005 % ophthalmic solution Place 1 drop into both eyes every morning. Patient taking differently: Place 1 drop into both eyes at bedtime.  04/04/17  Yes Angiulli, Mcarthur Rossettianiel J, PA-C  LORazepam (ATIVAN) 0.5 MG tablet Take 0.5  tablets (0.25 mg total) by mouth 2 (two) times daily. 06/24/17  Yes Angiulli, Mcarthur Rossettianiel J, PA-C  losartan (COZAAR) 100 MG tablet Take 1 tablet (100 mg total) by mouth daily. 06/24/17  Yes Angiulli, Mcarthur Rossettianiel J, PA-C  tiZANidine (ZANAFLEX) 2 MG tablet Take 1 tablet (2 mg total) by mouth at bedtime. 06/24/17  Yes Angiulli, Mcarthur Rossettianiel J, PA-C  traZODone (DESYREL) 50 MG tablet Take 1 tablet (50 mg total) by mouth at bedtime as needed for sleep. 06/24/17  Yes Angiulli, Mcarthur Rossettianiel J, PA-C  verapamil (CALAN-SR) 180 MG CR tablet Take 180 mg by mouth 2 (two) times daily. 05/07/17  Yes [provider]  VOLTAREN 1 % GEL Apply 2 g topically 4 (four) times daily. 06/24/17  Yes Angiulli, Mcarthur Rossettianiel J, PA-C    Family History Family History  Problem Relation Age of Onset  . Heart disease Mother   . Diabetes Mother   . Heart disease Father   . Heart disease Sister   . Heart disease Sister     Social History Social History  Substance Use Topics  . Smoking status: Former Games developermoker  . Smokeless tobacco: Never Used  . Alcohol use No     Allergies   Patient has no known allergies.   Review of Systems Review of Systems  Unable to perform ROS: Mental status change  All other systems reviewed and are negative.    Physical Exam Updated Vital Signs BP 139/65   Pulse 69   Temp 98.7 F (37.1 C) (Rectal)   Resp 20   Ht 4\' 10"  (1.473 m)   Wt 67.6 kg (149 lb)   SpO2 100%   BMI 31.14 kg/m   Physical Exam  Constitutional:  Altered, moaning   HENT:  Head: Normocephalic.  Eyes: Pupils are equal, round, and reactive to light. Conjunctivae and EOM are normal.  Neck: Normal range of motion. Neck supple.  Cardiovascular: Normal rate, regular rhythm and normal heart sounds.   Pulmonary/Chest: Effort normal.  Diminished bilaterally   Abdominal: Soft. Bowel sounds are normal. She exhibits no distension. There is no tenderness.  Musculoskeletal: Normal range of motion.  Neurological:  Altered, moving all extremities     Skin: Skin is warm.  Psychiatric:  Unable   Nursing note and vitals reviewed.    ED Treatments / Results  Labs (all labs ordered are listed, but only abnormal results are displayed) Labs Reviewed  COMPREHENSIVE METABOLIC PANEL - Abnormal; Notable for the following:       Result Value   CO2 20 (*)    Glucose, Bld 127 (*)    BUN 35 (*)    Creatinine, Ser 1.66 (*)    Calcium 8.6 (*)    Total Protein 5.6 (*)    Albumin 2.7 (*)    AST 75 (*)  GFR calc non Af Amer 30 (*)    GFR calc Af Amer 35 (*)    All other components within normal limits  CBC WITH DIFFERENTIAL/PLATELET - Abnormal; Notable for the following:    RBC 3.26 (*)    Hemoglobin 8.6 (*)    HCT 26.4 (*)    Neutro Abs 8.2 (*)    All other components within normal limits  URINALYSIS, ROUTINE W REFLEX MICROSCOPIC - Abnormal; Notable for the following:    APPearance CLOUDY (*)    Hgb urine dipstick LARGE (*)    Protein, ur 100 (*)    Leukocytes, UA LARGE (*)    Bacteria, UA MANY (*)    Squamous Epithelial / LPF 0-5 (*)    All other components within normal limits  CBG MONITORING, ED - Abnormal; Notable for the following:    Glucose-Capillary 153 (*)    All other components within normal limits  I-STAT CG4 LACTIC ACID, ED - Abnormal; Notable for the following:    Lactic Acid, Venous 2.01 (*)    All other components within normal limits  CULTURE, BLOOD (ROUTINE X 2)  CULTURE, BLOOD (ROUTINE X 2)  URINE CULTURE  I-STAT TROPONIN, ED  I-STAT CG4 LACTIC ACID, ED    EKG  EKG Interpretation  Date/Time:  Wednesday July 16 2017 15:19:24 EDT Ventricular Rate:  58 PR Interval:    QRS Duration: 105 QT Interval:  468 QTC Calculation: 460 R Axis:   67 Text Interpretation:  Junctional rhythm Probable left ventricular hypertrophy Borderline ST elevation, lateral leads Artifact in lead(s) I III aVR aVL aVF V2 No significant change since last tracing Confirmed by Richardean Canal [id number]) on 07/16/2017 3:37:23 PM        Radiology Ct Head Wo Contrast  Result Date: 07/16/2017 CLINICAL DATA:  Altered mental status EXAM: CT HEAD WITHOUT CONTRAST TECHNIQUE: Contiguous axial images were obtained from the base of the skull through the vertex without intravenous contrast. COMPARISON:  MRI  06/04/2017, CT brain 06/04/2017 FINDINGS: Brain: No large hemorrhage is visualized. Hypodensity within the left frontal lobe, may relate to evolving stroke noted on the comparison MRI. Punctate increased densities are noted in the region. Additional hypodensity within the right white matter, also likely reflects involving ischemic changes from prior stroke noted on the MRI. Atrophy. Small vessel ischemic changes of the white matter. No midline shift. Old right frontal lobe infarct. Vascular: No hyperdense vessels.  Carotid artery calcifications. Skull: No fracture. Sinuses/Orbits: Mild mucosal thickening in the ethmoid sinuses. No acute orbital abnormality. Other: None IMPRESSION: 1. Study is limited by patient motion and unconventional scanned plane. 2. Hypodensities within the left frontal lobe and right pericallosal white matter, suspect that this represents evolutional changes of previously noted infarcts on the MRI from June 2018. Petechial foci of increased density in the left frontal lobe hypodensity could reflect petechial hemorrhage/necrosis. MRI follow-up as clinically indicated. 3. Atrophy and small vessel ischemic changes of the white matter Electronically Signed   By: Jasmine Pang M.D.   On: 07/16/2017 17:59   Dg Bone Density  Result Date: 07/15/2017 EXAM: DUAL X-RAY ABSORPTIOMETRY (DXA) FOR BONE MINERAL DENSITY IMPRESSION: Referring Physician:  Lizbeth Bark PATIENT: Name: Analicia, Skibinski Patient ID: [id number] Birth Date: [birth year] Height: 62.0 in. Sex: Female Measured: 07/15/2017 Weight: 140.0 lbs. Indications: Advanced Age, Bilateral Ovariectomy (65.51), Estrogen Deficient, Hysterectomy, Postmenopausal Fractures: None  Treatments: Calcium (E943.0) ASSESSMENT: The BMD measured at AP Spine L1-L4 is 0.893 g/cm2 with a T-score of -2.4.  This patient is considered osteopenic according to World Health Organization [facility]) criteria. Site Region Measured Date Measured Age YA BMD Significant CHANGE T-score AP Spine  L1-L4      07/15/2017    [age]         -2.4    0.893 g/cm2 DualFemur Total Left 07/15/2017    71.0         -1.8    0.782 g/cm2 World Health Organization [facility]) criteria for post-menopausal, Caucasian Women: Normal       T-score at or above -1 SD Osteopenia   T-score between -1 and -2.5 SD Osteoporosis T-score at or below -2.5 SD RECOMMENDATION: National Osteoporosis Foundation recommends that FDA-approved medical therapies be considered in postmenopausal women and men age 51 or older with a: 1. Hip or vertebral (clinical or morphometric) fracture. 2. T-score of <-2.5 at the spine or hip. 3. Ten-year fracture probability by FRAX of 3% or greater for hip fracture or 20% or greater for major osteoporotic fracture. All treatment decisions require clinical judgment and consideration of individual patient factors, including patient preferences, co-morbidities, previous drug use, risk factors not captured in the FRAX model (e.g. falls, vitamin D deficiency, increased bone turnover, interval significant decline in bone density) and possible under - or over-estimation of fracture risk by FRAX. All patients should ensure an adequate intake of dietary calcium (1200 mg/d) and vitamin D (800 IU daily) unless contraindicated. FOLLOW-UP: People with diagnosed cases of osteoporosis or at high risk for fracture should have regular bone mineral density tests. For patients eligible for Medicare, routine testing is allowed once every 2 years. The testing frequency can be increased to one year for patients who have rapidly progressing disease, those who are receiving or discontinuing medical therapy to restore bone mass, or have additional risk factors.  I have reviewed this report, and agree with the above findings. [facility] FRAX* 10-year Probability of Fracture Based on femoral neck BMD: DualFemur (Right) Major Osteoporotic Fracture: 6.2% Hip Fracture:                1.1% Population:                  [place] (Hispanic) Risk Factors:                None *FRAX is a Armed forces logistics/support/administrative officer of the [employer] of [employer] for Metabolic Bone Disease, a World Science writer (WHO) [employer]. ASSESSMENT: The probability of a major osteoporotic fracture is 6.2 % within the next ten years. The probability of a hip fracture is 1.1 % within the next ten years. Electronically Signed   By: Lupita Raider, M.D.   On: 07/15/2017 15:33   Dg Chest Port 1 View  Result Date: 07/16/2017 CLINICAL DATA:  Altered mental status EXAM: PORTABLE CHEST 1 VIEW COMPARISON:  05/27/2017 FINDINGS: Mild cardiomegaly. No infiltrate or effusion. Aortic atherosclerosis. No pneumothorax. IMPRESSION: Cardiomegaly without edema or infiltrate. Electronically Signed   By: Jasmine Pang M.D.   On: 07/16/2017 15:56    Procedures Procedures (including critical care time)  Medications Ordered in ED Medications  sodium chloride 0.9 % bolus 1,000 mL (0 mLs Intravenous Stopped 07/16/17 1901)  LORazepam (ATIVAN) injection 1 mg (1 mg Intravenous Given 07/16/17 1706)  cefTRIAXone (ROCEPHIN) 1 g in dextrose 5 % 50 mL IVPB (0 g Intravenous Stopped 07/16/17 1736)     Initial Impression / Assessment and Plan / ED Course  I have reviewed the triage vital signs and  the nursing notes.  Pertinent labs & imaging results that were available during my care of the patient were reviewed by me and considered in my medical decision making (see chart for details).    Verita Lambrinda Norma Fredricksonoledo is a 71 y.o. female here with AMS, hypotension. BP 116/59 in the ED now. Will do sepsis workup, will get CT head to r/o bleed. Will admit.   8:18 PM Lactate 2. CT head showed evolving stroke. UA +  UTI. Given rocephin. Cultures sent. Will admit. Hospitalist request neuro consult and will admit.   8:18 PM I called Dr. Otelia LimesLindzen from neurology. He reviewed CT and states that the scanning technique is different but he thinks there are no new lesions. The hemorrhagic lesion is just evolution of the previous stroke. He recommend treat for infection, no MRI currently. If there is worsening mental status, then get formal neuro consult in the morning.   Final Clinical Impressions(s) / ED Diagnoses   Final diagnoses:  Infective urethritis    New Prescriptions New Prescriptions   No medications on file     Charlynne PanderYao, Shelle Galdamez Hsienta, MD 07/16/17 1851    Charlynne PanderYao, Josejulian Tarango Hsienta, MD 07/16/17 2018

## 2017-07-16 NOTE — ED Notes (Signed)
Blood cultures obtained X 2.

## 2017-07-16 NOTE — Progress Notes (Signed)
Pharmacy Antibiotic Note  Christina [name] is a 71 y.o. female admitted on 07/16/2017 with uro-sepsis. Received one dose of ceftriaxone in the ED. Pt was admitted in June and had a proteus UTI - that was sensitive to most antimicrobials. Pt with acute on chronic CKD, current eCrCl 25-30 ml/min.   Plan: -Cefepime 1 g IV q24h -Monitor cultures + sensitivities, deescalate as appropriate   Height: 4\' 10"  (147.3 cm) Weight: 149 lb (67.6 kg) IBW/kg (Calculated) : 40.9  Temp (24hrs), Avg:98.7 F (37.1 C), Min:98.7 F (37.1 C), Max:98.7 F (37.1 C)   Recent Labs Lab 07/16/17 1540 07/16/17 1553 07/16/17 1909  WBC 9.9  --   --   CREATININE 1.66*  --   --   LATICACIDVEN  --  2.01* 1.06    Estimated Creatinine Clearance: 25.3 mL/min (A) (by C-G formula based on SCr of 1.66 mg/dL (H)).    No Known Allergies  Antimicrobials this admission: 7/25 ceftriaxone x1 7/25 cefepime >  Dose adjustments this admission: N/A  Microbiology results: 7/25 blood cx: 7/25 urine cx:   Baldemar FridayMasters, Noemi Bellissimo M 07/16/2017 10:29 PM

## 2017-07-16 NOTE — ED Notes (Signed)
Attempted Report 

## 2017-07-16 NOTE — ED Notes (Signed)
Unsuccessful blood draw for cultures X 2.

## 2017-07-16 NOTE — Progress Notes (Addendum)
Harl FavorNotified Yates, MD and Ursula Alertoncall Kirby, NP that pt's rt upper leg is very swollen and bruised under it. Pt moans when rt leg is touched. No new orders given. Will continue to monitor pt. Nelda MarseilleJenny Thacker, RN

## 2017-07-16 NOTE — Progress Notes (Signed)
I was paged regarding the patient's right upper leg being "very swollen and bruised under it."  I went to evaluate the patient.  She was unchanged from prior in that she is unresponsive.  The nursing assistant was present and helped me to evaluate the patient.  The aide reported that the patient experienced significant pain with leg movement when they were placing the urinary containment device.  However, on visual exam the leg does not appear to be deformed or have significant edema compared to the left.  The patient was rolled to her left side and she does have a roughly 10 cm area of ecchymosis on the posterior upper leg adjacent to the knee that is purplish in color but does not appear to be concerning for hematoma at this time and so would not explain her anemia.  The patient did touch her groin area after we rolled her back but she did not cry out or appear to experience discomfort with rolling.  Will follow without further intervention at this time.  Georgana CurioJennifer E. Len Azeez, M.D.

## 2017-07-17 ENCOUNTER — Inpatient Hospital Stay: Payer: Self-pay | Admitting: Family Medicine

## 2017-07-17 DIAGNOSIS — I480 Paroxysmal atrial fibrillation: Secondary | ICD-10-CM

## 2017-07-17 DIAGNOSIS — D649 Anemia, unspecified: Secondary | ICD-10-CM | POA: Diagnosis not present

## 2017-07-17 DIAGNOSIS — A419 Sepsis, unspecified organism: Secondary | ICD-10-CM | POA: Diagnosis not present

## 2017-07-17 DIAGNOSIS — I10 Essential (primary) hypertension: Secondary | ICD-10-CM | POA: Diagnosis not present

## 2017-07-17 DIAGNOSIS — N189 Chronic kidney disease, unspecified: Secondary | ICD-10-CM

## 2017-07-17 DIAGNOSIS — I959 Hypotension, unspecified: Secondary | ICD-10-CM | POA: Diagnosis not present

## 2017-07-17 DIAGNOSIS — N179 Acute kidney failure, unspecified: Secondary | ICD-10-CM

## 2017-07-17 DIAGNOSIS — A4159 Other Gram-negative sepsis: Secondary | ICD-10-CM | POA: Diagnosis not present

## 2017-07-17 LAB — BASIC METABOLIC PANEL
ANION GAP: 7 (ref 5–15)
BUN: 32 mg/dL — ABNORMAL HIGH (ref 6–20)
CALCIUM: 8.7 mg/dL — AB (ref 8.9–10.3)
CHLORIDE: 109 mmol/L (ref 101–111)
CO2: 20 mmol/L — AB (ref 22–32)
CREATININE: 1.32 mg/dL — AB (ref 0.44–1.00)
GFR calc non Af Amer: 40 mL/min — ABNORMAL LOW (ref >60)
GFR, EST AFRICAN AMERICAN: 46 mL/min — AB (ref >60)
GLUCOSE: 111 mg/dL — AB (ref 65–99)
Potassium: 3.5 mmol/L (ref 3.5–5.1)
Sodium: 136 mmol/L (ref 135–145)

## 2017-07-17 LAB — CBC
HCT: 25.4 % — ABNORMAL LOW (ref 36.0–46.0)
HEMOGLOBIN: 8.1 g/dL — AB (ref 12.0–15.0)
MCH: 25.6 pg — AB (ref 26.0–34.0)
MCHC: 31.9 g/dL (ref 30.0–36.0)
MCV: 80.4 fL (ref 78.0–100.0)
Platelets: 181 10*3/uL (ref 150–400)
RBC: 3.16 MIL/uL — AB (ref 3.87–5.11)
RDW: 14.2 % (ref 11.5–15.5)
WBC: 5.9 10*3/uL (ref 4.0–10.5)

## 2017-07-17 LAB — LACTIC ACID, PLASMA: Lactic Acid, Venous: 0.9 mmol/L (ref 0.5–1.9)

## 2017-07-17 MED ORDER — LIDOCAINE 5 % EX PTCH
1.0000 | MEDICATED_PATCH | CUTANEOUS | Status: DC
  Administered 2017-07-17 – 2017-07-19 (×2): 1 via TRANSDERMAL
  Filled 2017-07-17 (×2): qty 1

## 2017-07-17 MED ORDER — VERAPAMIL HCL ER 180 MG PO TBCR
180.0000 mg | EXTENDED_RELEASE_TABLET | Freq: Two times a day (BID) | ORAL | Status: DC
  Administered 2017-07-17 – 2017-07-18 (×3): 180 mg via ORAL
  Filled 2017-07-17 (×4): qty 1

## 2017-07-17 MED ORDER — POLYETHYLENE GLYCOL 3350 17 G PO PACK
17.0000 g | PACK | Freq: Two times a day (BID) | ORAL | Status: DC
  Administered 2017-07-17 – 2017-07-19 (×5): 17 g via ORAL
  Filled 2017-07-17 (×5): qty 1

## 2017-07-17 NOTE — Plan of Care (Signed)
Problem: Skin Integrity: Goal: Risk for impaired skin integrity will decrease Outcome: Progressing Pt will be turned q 2 hrs to prevent pressure injury.  Problem: Nutrition: Goal: Adequate nutrition will be maintained Outcome: Progressing Pt's nutritional intake will improve and be back to baseline prior to discharge.

## 2017-07-17 NOTE — Evaluation (Signed)
Clinical/Bedside Swallow Evaluation Patient Details  Name: Christina [name] MRN: [id number] Date of Birth: [birth year]  Today's Date: 07/17/2017 Time: SLP Start Time (ACUTE ONLY): 13080905 SLP Stop Time (ACUTE ONLY): 0925 SLP Time Calculation (min) (ACUTE ONLY): 20 min  Past Medical History:  Past Medical History:  Diagnosis Date  . HLD (hyperlipidemia)   . Hypertension   . PAF (paroxysmal atrial fibrillation) (HCC)    on Eliquis  . Recurrent cystitis   . Stroke [facility](HCC)    Past Surgical History:  Past Surgical History:  Procedure Laterality Date  . ABDOMINAL HYSTERECTOMY    . EYE SURGERY     HPI:  71 [facility], diagnosed with sepsis.  Pt with leg edema, ? UTI, prior CVA.  CXR 7/25 negative x cardiomegaly.  Swallow eval ordered.    Assessment / Plan / Recommendation Clinical Impression  Pt presents with functional oropharyngeal swallow-except lack of dentition.  Able to "masticate" soft solids without oral residuals.  3 ounce water test administered by SlP passed without deficits. Swallow was timely with no indications of residuals nor aspiration.  No family present.  Recommend dys3/thin diet with full assist- medicine with puree.  NO SLP follow up at this time.  SLP Visit Diagnosis: Dysphagia, unspecified (R13.10)    Aspiration Risk  Mild aspiration risk    Diet Recommendation Dysphagia 3 (Mech soft);Thin liquid   Liquid Administration via: Cup Medication Administration: Whole meds with puree Supervision: Staff to assist with self feeding;Full supervision/cueing for compensatory strategies Compensations: Slow rate;Small sips/bites Postural Changes: Remain upright for at least 30 minutes after po intake;Seated upright at 90 degrees    Other  Recommendations Oral Care Recommendations: Oral care BID   Follow up Recommendations None      Frequency and Duration            Prognosis        Swallow Study   General Date of Onset: 07/17/17 HPI: 71 yo female  [facility], diagnosed with sepsis.  Pt with leg edema, ? UTI, prior CVA.  CXR 7/25 negative x cardiomegaly.  Swallow eval ordered.  Type of Study: Bedside Swallow Evaluation Diet Prior to this Study: Dysphagia 2 (chopped);Thin liquids Temperature Spikes Noted: No Respiratory Status: Room air History of Recent Intubation: No Behavior/Cognition: Alert;Doesn't follow directions Oral Cavity Assessment: Within Functional Limits Oral Care Completed by SLP: No Oral Cavity - Dentition: Edentulous Vision: Functional for self-feeding Self-Feeding Abilities: Total assist Patient Positioning: Upright in bed Baseline Vocal Quality: Normal Volitional Cough: Cognitively unable to elicit Volitional Swallow: Unable to elicit    Oral/Motor/Sensory Function Overall Oral Motor/Sensory Function: Generalized oral weakness (pt did not follow directions, able to seal lips on straw/spoon, no overt facial droop)   Ice Chips Ice chips: Within functional limits Presentation: Spoon   Thin Liquid Thin Liquid: Within functional limits Presentation: Straw    Nectar Thick Nectar Thick Liquid: Not tested   Honey Thick Honey Thick Liquid: Not tested   Puree Puree: Within functional limits Presentation: Spoon   Solid   GO   Solid: Within functional limits Other Comments: for soft foods given pt is edentulous        Chales [name], Christina Velie Ann 07/17/2017,9:41 AM Donavan Burnetamara Talley Casco, MS [facility] SLP [phone]

## 2017-07-17 NOTE — Progress Notes (Signed)
PROGRESS NOTE  Christina [name]  YQM:[id number] DOB: 10-Nov-1946 DOA: 07/16/2017 PCP: Christina [name]  Brief Narrative:   The patient is a 71 year old female with history of paroxysmal A. fib on Apixaban, history of CVA who is generally nonverbal but is able to nod head and sitting along with songs, hypertension, dyslipidemia, recurrent cystitis who [place] in March [place]. She suffered a stroke in [place] which resulted in left hemiparesis. She was admitted from June 13-15 of this year for an acute ischemic stroke after which she was deemed 'nonverbal.'  The daughter has been assisting her with toileting, transfers, and most ADLs. There were at the doctor's office getting a bone scan yesterday when her daughter noticed that she was moaning and acting like she was in pain. She seemed pale. Leg home, the daughter gave her mother a lidocaine patch to her back because she thought maybe her mother's back was hurt while lying on the scanner. Her mom continued to be less responsive, pale and continued to moans some the daughter has a visiting nurse check her blood pressure which was 70/50. EMS was called. In the emergency department her blood pressure had improved, her lactate was 2. A CT of the head demonstrated evolution of her previous stroke but no evidence of new lesions. Her urinalysis was concerning for urinary tract infection and she was started on antibiotics. Urine culture was sent. Since admission, the patient has become more alert and has been interacting with her daughter and singing along to songs on the radio.   Assessment & Plan:   Principal Problem:   Sepsis (HCC) Active Problems:   History of CVA (cerebrovascular accident)   Anemia   Acute kidney injury superimposed on chronic kidney disease (HCC)   Uncontrolled hypertension   Altered mental status   AF (paroxysmal atrial fibrillation) (HCC)   Acute lower UTI   Hyperglycemia   Severe  protein-calorie malnutrition (HCC)  Sepsis thought to be related to acute UTI -Tachypnea with elevated lactate to 2.01 (repeat 1.06) and hypotension -Blood cultures no growth to date -  Urine culture pending -Continue Cefepime   Altered mental status, improving and close to baseline according to daughter.  Was likely secondary to urinary tract infection and possibly some dehydration.  AKI on CKD, creatinine trended down from 1.66-1.32 (baseline) with IV fluids  Anemia.  Baseline hemoglobin of 10.7 on 7/3. Her hemoglobin was initially 8.6 g/dL and his trended down to 8.1 g/dL. -Repeat CBC in a.m. - Occult stool -  Okay to continue anticoagulation as long as she remains hemodynamically stable without evidence of acute hemorrhage  HTN, blood pressure elevated -Continue Coreg  - Resume verapamil  Afib -Patient with multiple CVAs resulting apparently from afib and inconsistent use of Eliquis -It is very important that the patient take this medication BID as directed to prevent future strokes as she is at very high risk -She is currently rate controlled  Hyperglycemia -Glucose 127 -A1c was 5.7 in 3/18  Malnutrition -Albumin 2.7; prior 3.2 on 6/18 -Nutrition consult  DVT prophylaxis:  Apixaban Code Status:  Full code Family Communication:  Patient and her daughter Disposition Plan:  Likely to skilled nursing facility tomorrow. Social worker consult placed. Follow-up H&H in a.m. and occult stool. Follow-up urine culture.   Consultants:   None  Procedures:  None  Antimicrobials:  Anti-infectives    Start     Dose/Rate Route Frequency Ordered Stop   07/17/17 0800  ceFEPIme (MAXIPIME) 1 g in dextrose 5 % 50 mL IVPB     1 g 100 mL/hr over 30 Minutes Intravenous Every 24 hours 07/16/17 2234     07/16/17 2230  ceFEPIme (MAXIPIME) 2 g in dextrose 5 % 50 mL IVPB  Status:  Discontinued     2 g 100 mL/hr over 30 Minutes Intravenous  Once 07/16/17 2218 07/16/17 2234    07/16/17 1700  cefTRIAXone (ROCEPHIN) 1 g in dextrose 5 % 50 mL IVPB     1 g 100 mL/hr over 30 Minutes Intravenous  Once 07/16/17 1646 07/16/17 1736       Subjective:  Patient is nonverbal when she is asked questions. She does not consistently nod her head or shake her head when I ask her questions. She makes good eye contact with her daughter. The daughter feels that she can interpret her mother's answers.  Objective: Vitals:   07/16/17 2253 07/17/17 0500 07/17/17 0950 07/17/17 1407  BP: (!) 112/57 (!) 156/68 (!) 160/70 (!) 120/45  Pulse: 73 80 91 66  Resp: 18   17  Temp: 99.2 F (37.3 C) 98.7 F (37.1 C) 98.5 F (36.9 C) 99.7 F (37.6 C)  TempSrc: Oral  Oral   SpO2: 100% 100% 100% 99%  Weight: 64.3 kg (141 lb 12.8 oz)     Height:        Intake/Output Summary (Last 24 hours) at 07/17/17 1900 Last data filed at 07/17/17 1840  Gross per 24 hour  Intake           2862.5 ml  Output              700 ml  Net           2162.5 ml   Filed Weights   07/16/17 1528 07/16/17 2253  Weight: 67.6 kg (149 lb) 64.3 kg (141 lb 12.8 oz)    Examination:  General exam:  Adult Female.  No acute distress.  Awake, alert HEENT:  NCAT, MMM Respiratory system: Clear to auscultation bilaterally Cardiovascular system: Regular rate and rhythm, normal S1/S2. No murmurs, rubs, gallops or clicks.  Warm extremities Gastrointestinal system: Normal active bowel sounds, soft, nondistended, nontender. MSK:  Normal tone and bulk, no lower extremity edema Neuro:  Left hemiparesis with left facial droop, able to sing coherently to songs, but unable to answer questions or speak in sentences or even Christina [name] words in conversation    Data Reviewed: I have personally reviewed following labs and imaging studies  CBC:  Recent Labs Lab 07/16/17 1540 07/17/17 0118  WBC 9.9 5.9  NEUTROABS 8.2*  --   HGB 8.6* 8.1*  HCT 26.4* 25.4*  MCV 81.0 80.4  PLT 227 181   Basic Metabolic Panel:  Recent Labs Lab  07/16/17 1540 07/17/17 0118  NA 135 136  K 3.5 3.5  CL 106 109  CO2 20* 20*  GLUCOSE 127* 111*  BUN 35* 32*  CREATININE 1.66* 1.32*  CALCIUM 8.6* 8.7*   GFR: Estimated Creatinine Clearance: 31 mL/min (A) (by C-G formula based on SCr of 1.32 mg/dL (H)). Liver Function Tests:  Recent Labs Lab 07/16/17 1540  AST 75*  ALT 44  ALKPHOS 55  BILITOT 0.7  PROT 5.6*  ALBUMIN 2.7*   No results for input(s): LIPASE, AMYLASE in the last 168 hours. No results for input(s): AMMONIA in the last 168 hours. Coagulation Profile:  Recent Labs Lab 07/16/17 2234  INR 1.90   Cardiac Enzymes: No results for input(s):  CKTOTAL, CKMB, CKMBINDEX, TROPONINI in the last 168 hours. BNP (last 3 results) No results for input(s): PROBNP in the last 8760 hours. HbA1C: No results for input(s): HGBA1C in the last 72 hours. CBG:  Recent Labs Lab 07/16/17 1516  GLUCAP 153*   Lipid Profile: No results for input(s): CHOL, HDL, LDLCALC, TRIG, CHOLHDL, LDLDIRECT in the last 72 hours. Thyroid Function Tests: No results for input(s): TSH, T4TOTAL, FREET4, T3FREE, THYROIDAB in the last 72 hours. Anemia Panel: No results for input(s): VITAMINB12, FOLATE, FERRITIN, TIBC, IRON, RETICCTPCT in the last 72 hours. Urine analysis:    Component Value Date/Time   COLORURINE YELLOW 07/16/2017 1642   APPEARANCEUR CLOUDY (A) 07/16/2017 1642   LABSPEC 1.014 07/16/2017 1642   PHURINE 6.0 07/16/2017 1642   GLUCOSEU NEGATIVE 07/16/2017 1642   HGBUR LARGE (A) 07/16/2017 1642   BILIRUBINUR NEGATIVE 07/16/2017 1642   BILIRUBINUR neg 05/08/2017 0952   KETONESUR NEGATIVE 07/16/2017 1642   PROTEINUR 100 (A) 07/16/2017 1642   UROBILINOGEN 0.2 05/08/2017 0952   NITRITE NEGATIVE 07/16/2017 1642   LEUKOCYTESUR LARGE (A) 07/16/2017 1642   Sepsis Labs: @LABRCNTIP (procalcitonin:4,lacticidven:4)  ) Recent Results (from the past 240 hour(s))  Blood Culture (routine x 2)     Status: None (Preliminary result)    Collection Time: 07/16/17  5:00 PM  Result Value Ref Range Status   Specimen Description BLOOD RIGHT ANTECUBITAL  Final   Special Requests   Final    BOTTLES DRAWN AEROBIC AND ANAEROBIC Blood Culture adequate volume   Culture NO GROWTH < 24 HOURS  Final   Report Status PENDING  Incomplete  Blood Culture (routine x 2)     Status: None (Preliminary result)   Collection Time: 07/16/17  5:08 PM  Result Value Ref Range Status   Specimen Description BLOOD RIGHT WRIST  Final   Special Requests   Final    BOTTLES DRAWN AEROBIC AND ANAEROBIC Blood Culture adequate volume   Culture NO GROWTH < 24 HOURS  Final   Report Status PENDING  Incomplete      Radiology Studies: Ct Head Wo Contrast  Result Date: 07/16/2017 CLINICAL DATA:  Altered mental status EXAM: CT HEAD WITHOUT CONTRAST TECHNIQUE: Contiguous axial images were obtained from the base of the skull through the vertex without intravenous contrast. COMPARISON:  MRI  06/04/2017, CT brain 06/04/2017 FINDINGS: Brain: No large hemorrhage is visualized. Hypodensity within the left frontal lobe, may relate to evolving stroke noted on the comparison MRI. Punctate increased densities are noted in the region. Additional hypodensity within the right white matter, also likely reflects involving ischemic changes from prior stroke noted on the MRI. Atrophy. Small vessel ischemic changes of the white matter. No midline shift. Old right frontal lobe infarct. Vascular: No hyperdense vessels.  Carotid artery calcifications. Skull: No fracture. Sinuses/Orbits: Mild mucosal thickening in the ethmoid sinuses. No acute orbital abnormality. Other: None IMPRESSION: 1. Study is limited by patient motion and unconventional scanned plane. 2. Hypodensities within the left frontal lobe and right pericallosal white matter, suspect that this represents evolutional changes of previously noted infarcts on the MRI from June 2018. Petechial foci of increased density in the left  frontal lobe hypodensity could reflect petechial hemorrhage/necrosis. MRI follow-up as clinically indicated. 3. Atrophy and small vessel ischemic changes of the white matter Electronically Signed   By: Jasmine Pang M.D.   On: 07/16/2017 17:59   Dg Chest Port 1 View  Result Date: 07/16/2017 CLINICAL DATA:  Altered mental status EXAM: PORTABLE CHEST 1  VIEW COMPARISON:  05/27/2017 FINDINGS: Mild cardiomegaly. No infiltrate or effusion. Aortic atherosclerosis. No pneumothorax. IMPRESSION: Cardiomegaly without edema or infiltrate. Electronically Signed   By: Jasmine Pang M.D.   On: 07/16/2017 15:56     Scheduled Meds: . apixaban  5 mg Oral BID  . atorvastatin  20 mg Oral BH-q7a  . carvedilol  25 mg Oral BID WC  . chlorhexidine  15 mL Mouth Rinse BID  . docusate sodium  100 mg Oral BID  . latanoprost  1 drop Both Eyes QHS  . LORazepam  0.25 mg Oral BID  . mouth rinse  15 mL Mouth Rinse q12n4p  . polyethylene glycol  17 g Oral BID  . tiZANidine  2 mg Oral QHS  . verapamil  180 mg Oral BID   Continuous Infusions: . sodium chloride 75 mL/hr at 07/16/17 2230  . ceFEPime (MAXIPIME) IV Stopped (07/17/17 1010)     LOS: 1 day    Time spent: 30 min    Renae Fickle, MD [facility] Pager [phone]  If 7PM-7AM, please contact night-coverage [email or web address] Password [email or web address] 07/17/2017, 7:00 PM

## 2017-07-17 NOTE — Clinical Social Work Note (Signed)
Clinical Social Work Assessment  Patient Details  Name: Christina [name] MRN: [id number] Date of Birth: 05-03-1946  Date of referral:  07/17/17               Reason for consult:  Facility Placement                Permission sought to share information with:  Facility Medical sales representativeContact Representative, Family Supports Permission granted to share information::  No  Name::     HaematologistCynthia  Agency::  SNFs  Relationship::  Daughter  Contact Information:     Housing/Transportation Living arrangements for the past 2 months:  Single Family Home Source of Information:  Adult Children Patient Interpreter Needed:  None Criminal Activity/Legal Involvement Pertinent to Current Situation/Hospitalization:  No - Comment as needed Significant Relationships:  Adult Children Lives with:  Adult Children Do you feel safe going back to the place where you live?  No Need for family participation in patient care:  Yes (Comment)  Care giving concerns:  CSW received consult for possible SNF placement at time of discharge. CSW spoke with patient's daughter, Christina [name], regarding MD recommendation of SNF placement at time of discharge. Patient's daughter reported that given patient's current physical needs and fall risk, she would like to look into SNF for short term. She feels tired and unable to care for patient at this time. Patient was just at the hospital recently and was discharged "worse off than she is now" but improved at home with physical therapy. She is hoping that will happen again at SNF so she can return home. Patient's daughter expressed understanding of recommendation and is agreeable to SNF placement at time of discharge. CSW to continue to follow and assist with discharge planning needs.   Social Worker assessment / plan:  CSW spoke with patient's daughter concerning [facility] before returning home.  Employment status:  Retired Health and safety inspectornsurance information:  Armed forces operational officerMedicare, [place]  Recommendations:  Not assessed at this time Information / Referral to community resources:  Skilled Nursing Facility  Patient/Family's Response to care:  Patient's daughter recognizes need for rehab before returning home and [place]. [facility]  Patient/Family's Understanding of and Emotional Response to Diagnosis, Current Treatment, and Prognosis:  Patient/family is realistic regarding therapy needs and expressed being hopeful for SNF placement. Patient's daughter expressed understanding of CSW role and discharge process as well as medical condition. No questions/concerns about plan or treatment.    Emotional Assessment Appearance:  Appears stated age Attitude/Demeanor/Rapport:  Unable to Assess Affect (typically observed):  Unable to Assess Orientation:   (Disoriented x4) Alcohol / Substance use:  Not Applicable Psych involvement (Current and /or in the community):  No (Comment)  Discharge Needs  Concerns to be addressed:  Care Coordination Readmission within the last 30 days:  Yes Current discharge risk:  None Barriers to Discharge:  Continued Medical Work up   [employer] S Aubreyana Saltz, LCSWA 07/17/2017, 4:16 PM

## 2017-07-17 NOTE — NC FL2 (Signed)
[place] MEDICAID FL2 LEVEL OF CARE SCREENING TOOL     IDENTIFICATION  Patient Name: Christina [name] Birthdate: [birth year] Sex: female Admission Date (Current Location): 07/16/2017  [facility] [place]   Facility and Address:  [facility], [address], [place], [place] [address]      Provider Number: [id number]  Attending Physician Name and Address:  Renae FickleShort, Mackenzie, MD  Relative Name and Phone Number:  [name]-daughter, [phone]    Current Level of Care: Hospital Recommended Level of Care: Skilled Nursing Facility Prior Approval Number:    Date Approved/Denied:   PASRR Number: [id number]  Discharge Plan: SNF    Current Diagnoses: Patient Active Problem List   Diagnosis Date Noted  . Sepsis (HCC) 07/16/2017  . Hyperglycemia 07/16/2017  . Severe protein-calorie malnutrition (HCC) 07/16/2017  . Blood pressure alteration   . Jerking   . Acute lower UTI   . Hypertensive crisis   . Broca's aphasia   . Cognitive communication disorder   . Cerebellar infarct (HCC) 06/06/2017  . 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 Uncontrolled hypertension 06/04/2017  . Fall at home 06/04/2017  . Altered mental status 06/04/2017  . History of CVA in adulthood 06/04/2017  . AF (paroxysmal atrial fibrillation) (HCC) 06/04/2017  . CKD (chronic kidney disease), stage III 06/04/2017  . Depression 06/04/2017  . Aphasia due to acute stroke (HCC)   . Encephalopathy, hypertensive 05/07/2017  . Enterococcus UTI 05/07/2017  . Hypertensive urgency 05/02/2017  . Hypertensive emergency 05/01/2017  . History of suicidal ideation   . Hypokalemia   . Suicide ideation   . Sleep disturbance   . Labile blood pressure   . Acute kidney injury superimposed on chronic kidney disease (HCC)   . Stage 3 chronic kidney disease   . Anemia   . Benign essential HTN   . Adjustment disorder with mixed anxiety and depressed mood    . 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 Left hemiparesis (HCC)   . Primary osteoarthritis of left knee   . Left knee pain   . Diplopia   . Acute cystitis without hematuria   . Acute CVA (cerebrovascular accident) (HCC)   . History of CVA (cerebrovascular accident) 03/14/2017  . HTN (hypertension) 03/14/2017  . HLD (hyperlipidemia) 03/14/2017  . 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    Orientation RESPIRATION BLADDER Height & Weight      (Disoriented x4)  Normal Incontinent, External catheter Weight: 64.3 kg (141 lb 12.8 oz) Height:  4\' 10"  (147.3 cm)  BEHAVIORAL SYMPTOMS/MOOD NEUROLOGICAL BOWEL NUTRITION STATUS      Incontinent Diet (Please see DC Summary)  AMBULATORY STATUS COMMUNICATION OF NEEDS Skin   Extensive Assist Non-Verbally (Moans) Normal                       Personal Care Assistance Level of Assistance  Bathing, Feeding, Dressing Bathing Assistance: Maximum assistance Feeding assistance: Limited assistance Dressing Assistance: Maximum assistance     Functional Limitations Info             SPECIAL CARE FACTORS FREQUENCY  PT (By licensed PT)     PT Frequency: not yet assessed              Contractures      Additional Factors Info  Code Status, Allergies, Psychotropic Code Status Info: Full Allergies Info: NKA Psychotropic Info: Ativan         Current  Medications (07/17/2017):  This is the current hospital active medication list Current Facility-Administered Medications  Medication Dose Route Frequency Provider Last Rate Last Dose  . 0.9 %  sodium chloride infusion   Intravenous Continuous Jonah BlueYates, Jennifer, MD 75 mL/hr at 07/16/17 2230    . acetaminophen (TYLENOL) tablet 650 mg  650 mg Oral Q6H PRN Jonah BlueYates, Jennifer, MD       Or  . acetaminophen (TYLENOL) suppository 650 mg  650 mg Rectal Q6H PRN Jonah BlueYates, Jennifer, MD      . apixaban Everlene Balls(ELIQUIS) tablet 5 mg  5 mg Oral BID Jonah BlueYates, Jennifer, MD   5 mg at 07/17/17 0954  . atorvastatin (LIPITOR)  tablet 20 mg  20 mg Oral Wille CelesteBH-q7a Yates, Jennifer, MD   20 mg at 07/17/17 0956  . carvedilol (COREG) tablet 25 mg  25 mg Oral BID WC Jonah BlueYates, Jennifer, MD   25 mg at 07/17/17 0956  . ceFEPIme (MAXIPIME) 1 g in dextrose 5 % 50 mL IVPB  1 g Intravenous Q24H Baldemar FridayMasters, Alison M, [facility]   Stopped at 07/17/17 1010  . chlorhexidine (PERIDEX) 0.12 % solution 15 mL  15 mL Mouth Rinse BID Jonah BlueYates, Jennifer, MD   15 mL at 07/17/17 1000  . docusate sodium (COLACE) capsule 100 mg  100 mg Oral BID Jonah BlueYates, Jennifer, MD   100 mg at 07/17/17 0955  . latanoprost (XALATAN) 0.005 % ophthalmic solution 1 drop  1 drop Both Eyes QHS Jonah BlueYates, Jennifer, MD   1 drop at 07/16/17 2358  . LORazepam (ATIVAN) tablet 0.25 mg  0.25 mg Oral BID Jonah BlueYates, Jennifer, MD   0.25 mg at 07/17/17 0956  . MEDLINE mouth rinse  15 mL Mouth Rinse q12n4p Jonah BlueYates, Jennifer, MD   15 mL at 07/17/17 1302  . ondansetron (ZOFRAN) tablet 4 mg  4 mg Oral Q6H PRN Jonah BlueYates, Jennifer, MD       Or  . ondansetron [facility](ZOFRAN) injection 4 mg  4 mg Intravenous Q6H PRN Jonah BlueYates, Jennifer, MD      . polyethylene glycol (MIRALAX / GLYCOLAX) packet 17 g  17 g Oral BID Renae FickleShort, Mackenzie, MD   17 g at 07/17/17 0959  . tiZANidine (ZANAFLEX) tablet 2 mg  2 mg Oral QHS Jonah BlueYates, Jennifer, MD      . traZODone (DESYREL) tablet 50 mg  50 mg Oral QHS PRN Jonah BlueYates, Jennifer, MD      . verapamil (CALAN-SR) CR tablet 180 mg  180 mg Oral BID Renae FickleShort, Mackenzie, MD   180 mg at 07/17/17 [id number]     Discharge Medications: Please see discharge summary for a list of discharge medications.  Relevant Imaging Results:  Relevant Lab Results:   Additional Information SSN: [id number]  Jerald Hennington S Chest SpringsRayyan, [place]

## 2017-07-18 DIAGNOSIS — A419 Sepsis, unspecified organism: Secondary | ICD-10-CM | POA: Diagnosis not present

## 2017-07-18 DIAGNOSIS — N189 Chronic kidney disease, unspecified: Secondary | ICD-10-CM | POA: Diagnosis not present

## 2017-07-18 DIAGNOSIS — I959 Hypotension, unspecified: Secondary | ICD-10-CM | POA: Diagnosis not present

## 2017-07-18 DIAGNOSIS — A4159 Other Gram-negative sepsis: Secondary | ICD-10-CM | POA: Diagnosis not present

## 2017-07-18 DIAGNOSIS — D649 Anemia, unspecified: Secondary | ICD-10-CM | POA: Diagnosis not present

## 2017-07-18 DIAGNOSIS — N179 Acute kidney failure, unspecified: Secondary | ICD-10-CM | POA: Diagnosis not present

## 2017-07-18 DIAGNOSIS — M549 Dorsalgia, unspecified: Secondary | ICD-10-CM

## 2017-07-18 LAB — BLOOD CULTURE ID PANEL (REFLEXED)
Acinetobacter baumannii: NOT DETECTED
CANDIDA KRUSEI: NOT DETECTED
CANDIDA PARAPSILOSIS: NOT DETECTED
Candida albicans: NOT DETECTED
Candida glabrata: NOT DETECTED
Candida tropicalis: NOT DETECTED
ESCHERICHIA COLI: NOT DETECTED
Enterobacter cloacae complex: NOT DETECTED
Enterobacteriaceae species: NOT DETECTED
Enterococcus species: NOT DETECTED
Haemophilus influenzae: NOT DETECTED
KLEBSIELLA OXYTOCA: NOT DETECTED
Klebsiella pneumoniae: NOT DETECTED
Listeria monocytogenes: NOT DETECTED
Neisseria meningitidis: NOT DETECTED
PROTEUS SPECIES: NOT DETECTED
Pseudomonas aeruginosa: NOT DETECTED
SERRATIA MARCESCENS: NOT DETECTED
STAPHYLOCOCCUS AUREUS BCID: NOT DETECTED
STAPHYLOCOCCUS SPECIES: NOT DETECTED
STREPTOCOCCUS PYOGENES: NOT DETECTED
Streptococcus agalactiae: NOT DETECTED
Streptococcus pneumoniae: NOT DETECTED
Streptococcus species: NOT DETECTED

## 2017-07-18 LAB — FERRITIN: FERRITIN: 124 ng/mL (ref 11–307)

## 2017-07-18 LAB — VITAMIN B12: Vitamin B-12: 512 pg/mL (ref 180–914)

## 2017-07-18 LAB — HEMOGLOBIN AND HEMATOCRIT, BLOOD
HCT: 25.7 % — ABNORMAL LOW (ref 36.0–46.0)
HEMOGLOBIN: 8.2 g/dL — AB (ref 12.0–15.0)

## 2017-07-18 LAB — BASIC METABOLIC PANEL
ANION GAP: 6 (ref 5–15)
BUN: 25 mg/dL — ABNORMAL HIGH (ref 6–20)
CO2: 20 mmol/L — AB (ref 22–32)
Calcium: 8.6 mg/dL — ABNORMAL LOW (ref 8.9–10.3)
Chloride: 113 mmol/L — ABNORMAL HIGH (ref 101–111)
Creatinine, Ser: 1.21 mg/dL — ABNORMAL HIGH (ref 0.44–1.00)
GFR, EST AFRICAN AMERICAN: 51 mL/min — AB (ref >60)
GFR, EST NON AFRICAN AMERICAN: 44 mL/min — AB (ref >60)
Glucose, Bld: 111 mg/dL — ABNORMAL HIGH (ref 65–99)
Potassium: 3.9 mmol/L (ref 3.5–5.1)
Sodium: 139 mmol/L (ref 135–145)

## 2017-07-18 LAB — CBC
HEMATOCRIT: 22.4 % — AB (ref 36.0–46.0)
Hemoglobin: 7.2 g/dL — ABNORMAL LOW (ref 12.0–15.0)
MCH: 26.3 pg (ref 26.0–34.0)
MCHC: 32.1 g/dL (ref 30.0–36.0)
MCV: 81.8 fL (ref 78.0–100.0)
Platelets: 156 10*3/uL (ref 150–400)
RBC: 2.74 MIL/uL — AB (ref 3.87–5.11)
RDW: 14.5 % (ref 11.5–15.5)
WBC: 6.2 10*3/uL (ref 4.0–10.5)

## 2017-07-18 LAB — IRON AND TIBC
Iron: 171 ug/dL — ABNORMAL HIGH (ref 28–170)
SATURATION RATIOS: 81 % — AB (ref 10.4–31.8)
TIBC: 211 ug/dL — AB (ref 250–450)
UIBC: 40 ug/dL

## 2017-07-18 LAB — PREPARE RBC (CROSSMATCH)

## 2017-07-18 LAB — TSH: TSH: 3.748 u[IU]/mL (ref 0.350–4.500)

## 2017-07-18 LAB — OCCULT BLOOD X 1 CARD TO LAB, STOOL: Fecal Occult Bld: NEGATIVE

## 2017-07-18 LAB — FOLATE: FOLATE: 6.9 ng/mL (ref >5.9)

## 2017-07-18 LAB — ABO/RH: ABO/RH(D): A POS

## 2017-07-18 LAB — LACTATE DEHYDROGENASE: LDH: 254 U/L — ABNORMAL HIGH (ref 98–192)

## 2017-07-18 MED ORDER — SODIUM CHLORIDE 0.9 % IV SOLN
Freq: Once | INTRAVENOUS | Status: AC
  Administered 2017-07-18: 11:00:00 via INTRAVENOUS

## 2017-07-18 NOTE — Progress Notes (Signed)
Dr. Malachi BondsShort informed of BP 84/36. Patient to receive 1 unit PRBC.

## 2017-07-18 NOTE — Progress Notes (Signed)
OT Cancellation Note  Patient Details Name: Hardin Negusrinda Vannatter MRN: [id number] DOB: [birth year]   Cancelled Treatment:    Reason Eval/Treat Not Completed: Medical issues which prohibited therapy. Pt with low BP of 84/36 and getting blood transfusion  Galen ManilaSpencer, Aloma Boch Jeanette 07/18/2017, 12:02 PM

## 2017-07-18 NOTE — Progress Notes (Signed)
PHARMACY - PHYSICIAN COMMUNICATION CRITICAL VALUE ALERT - BLOOD CULTURE IDENTIFICATION (BCID)  Results for orders placed or performed during the hospital encounter of 07/16/17  Blood Culture ID Panel (Reflexed) (Collected: 07/16/2017  5:08 PM)  Result Value Ref Range   Enterococcus species NOT DETECTED NOT DETECTED   Listeria monocytogenes NOT DETECTED NOT DETECTED   Staphylococcus species NOT DETECTED NOT DETECTED   Staphylococcus aureus NOT DETECTED NOT DETECTED   Streptococcus species NOT DETECTED NOT DETECTED   Streptococcus agalactiae NOT DETECTED NOT DETECTED   Streptococcus pneumoniae NOT DETECTED NOT DETECTED   Streptococcus pyogenes NOT DETECTED NOT DETECTED   Acinetobacter baumannii NOT DETECTED NOT DETECTED   Enterobacteriaceae species NOT DETECTED NOT DETECTED   Enterobacter cloacae complex NOT DETECTED NOT DETECTED   Escherichia coli NOT DETECTED NOT DETECTED   Klebsiella oxytoca NOT DETECTED NOT DETECTED   Klebsiella pneumoniae NOT DETECTED NOT DETECTED   Proteus species NOT DETECTED NOT DETECTED   Serratia marcescens NOT DETECTED NOT DETECTED   Haemophilus influenzae NOT DETECTED NOT DETECTED   Neisseria meningitidis NOT DETECTED NOT DETECTED   Pseudomonas aeruginosa NOT DETECTED NOT DETECTED   Candida albicans NOT DETECTED NOT DETECTED   Candida glabrata NOT DETECTED NOT DETECTED   Candida krusei NOT DETECTED NOT DETECTED   Candida parapsilosis NOT DETECTED NOT DETECTED   Candida tropicalis NOT DETECTED NOT DETECTED    Name of physician (or Provider) Contacted: Short, MD  Changes to prescribed antibiotics required: 1 or 4 bottles with Gram variable rods (aerobic bottle).  Likely contaminant, no change to antibiotics needed.  Tad MooreJessica Denver Harder, Pharm D, BCPS  Clinical Pharmacist Pager [phone]  07/18/2017 5:25 PM

## 2017-07-18 NOTE — Progress Notes (Signed)
PROGRESS NOTE  Christina [name]  ZOX:[id number] DOB: [birth year] DOA: 07/16/2017 PCP: Lizbeth Bark, FNP  Brief Narrative:   The patient is a 71 year old female with history of paroxysmal A. fib on Apixaban, history of CVA who is generally nonverbal but is able to nod head and sitting along with songs, hypertension, dyslipidemia, recurrent cystitis who moved to [place] from [place] in March [place]. 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 She was admitted from June 13-15 of this year for an acute ischemic stroke after which she was deemed 'nonverbal.'  The daughter has been assisting her with toileting, transfers, and most ADLs. There were at the doctor's office getting a bone scan yesterday when her daughter noticed that she was moaning and acting like she was in pain. She seemed pale. Leg home, the daughter gave her mother a lidocaine patch to her back because she thought maybe her mother's back was hurt while lying on the scanner. Her mom continued to be less responsive, pale and continued to moans some the daughter has a visiting nurse check her blood pressure which was 70/50. EMS was called. In the emergency department her blood pressure had improved, her lactate was 2. A CT of the head demonstrated evolution of her previous stroke but no evidence of new lesions. Her urinalysis was concerning for urinary tract infection and she was started on antibiotics. Urine culture was sent. Since admission, the patient has become more alert and has been interacting with her daughter and singing along to songs on the radio.   Assessment & Plan:   Principal Problem:   Sepsis (HCC) Active Problems:   History of CVA (cerebrovascular accident)   Anemia   Acute kidney injury superimposed on chronic kidney disease (HCC)   Uncontrolled hypertension   Altered mental status   AF (paroxysmal atrial fibrillation) (HCC)   Acute lower UTI   Hyperglycemia   Severe  protein-calorie malnutrition (HCC)  Sepsis thought to be related to acute UTI -Tachypnea with elevated lactate to 2.01 (repeat 1.06) and hypotension -Blood cultures no growth to date -  Urine culture > 100 000 colonies GNR - Continue Cefepime   Altered mental status, improving and close to baseline according to daughter.  Was likely secondary to urinary tract infection and possibly some dehydration.  AKI on CKD, creatinine trended down from 1.66-1.32 (baseline) with IV fluids  Symptomatic Anemia with hypotension.  Baseline hemoglobin of 10.7 on 7/3. Hemoglobin trending down.   -  Transfuse 1 unit PRBC -  Occult stool obtained but wasn't sent to lab.  Stool was brown.  -  I think we would have seen some rectal bleeding by now given how much her hemoglobin has trended down  -  Will check iron studies, b12, folate, LDH/haptoglobin and repeat H&H  -  D/c anticoagulation pending further investigation -  CT a/p to eval for retroperitoneal hemorrhage  HTN, blood pressure elevated -Continue Coreg  - Resume verapamil  Afib -Patient with multiple CVAs resulting apparently from afib and inconsistent use of Eliquis - hold eliquis due to possible hemorrhage  Hyperglycemia -Glucose 127 -A1c was 5.7 in 3/18  Malnutrition -Albumin 2.7; prior 3.2 on 6/18 -Nutrition consult  DVT prophylaxis:  Apixaban Code Status:  Full code Family Communication:  Patient alone Disposition Plan:  Awaiting stable hemoglobin, further work up for anemia.    Consultants:   None  Procedures:  None  Antimicrobials:  Anti-infectives    Start  Dose/Rate Route Frequency Ordered Stop   07/17/17 0800  ceFEPIme (MAXIPIME) 1 g in dextrose 5 % 50 mL IVPB     1 g 100 mL/hr over 30 Minutes Intravenous Every 24 hours 07/16/17 2234     07/16/17 2230  ceFEPIme (MAXIPIME) 2 g in dextrose 5 % 50 mL IVPB  Status:  Discontinued     2 g 100 mL/hr over 30 Minutes Intravenous  Once 07/16/17 2218 07/16/17 2234    07/16/17 1700  cefTRIAXone (ROCEPHIN) 1 g in dextrose 5 % 50 mL IVPB     1 g 100 mL/hr over 30 Minutes Intravenous  Once 07/16/17 1646 07/16/17 1736       Subjective:  Patient is nonverbal.  Poor eye contact today.  Per RN, she was moaning/shouting a lot this morning.  Seemed to be in pain when moved.  Objective: Vitals:   07/18/17 1102 07/18/17 1130 07/18/17 1439 07/18/17 1734  BP: (!) 84/36 (!) 95/39 (!) 120/52 (!) 123/44  Pulse: (!) 56 (!) 56 70 69  Resp: 18 18 20 20   Temp: 98.3 F (36.8 C) 98.9 F (37.2 C) 99.5 F (37.5 C) 98.5 F (36.9 C)  TempSrc: Axillary Axillary Axillary Oral  SpO2: 97% 97% 100% 97%  Weight:      Height:        Intake/Output Summary (Last 24 hours) at 07/18/17 1841 Last data filed at 07/18/17 1456  Gross per 24 hour  Intake             2178 ml  Output             1000 ml  Net             1178 ml   Filed Weights   07/16/17 1528 07/16/17 2253  Weight: 67.6 kg (149 lb) 64.3 kg (141 lb 12.8 oz)    Examination:  General exam:  Adult Female.  No acute distress.  Awake, alert HEENT:  NCAT, MMM Respiratory system: Clear to auscultation bilaterally Cardiovascular system: Regular rate and rhythm, normal S1/S2. No murmurs, rubs, gallops or clicks.  Warm extremities Gastrointestinal system: Normal active bowel sounds, soft, nondistended, nontender. MSK:  Normal tone and bulk, no lower extremity edema Neuro:  Left hemiparesis with left facial droop, did not interact with me today Rectal:  Ball of stool removed from rectal vault.  Firm, brown, no obvious blood.  Occult stool sent to lab.  Hemorrhoids present.   Data Reviewed: I have personally reviewed following labs and imaging studies  CBC:  Recent Labs Lab 07/16/17 1540 07/17/17 0118 07/18/17 0431  WBC 9.9 5.9 6.2  NEUTROABS 8.2*  --   --   HGB 8.6* 8.1* 7.2*  HCT 26.4* 25.4* 22.4*  MCV 81.0 80.4 81.8  PLT 227 181 156   Basic Metabolic Panel:  Recent Labs Lab 07/16/17 1540  07/17/17 0118 07/18/17 0431  NA 135 136 139  K 3.5 3.5 3.9  CL 106 109 113*  CO2 20* 20* 20*  GLUCOSE 127* 111* 111*  BUN 35* 32* 25*  CREATININE 1.66* 1.32* 1.21*  CALCIUM 8.6* 8.7* 8.6*   GFR: Estimated Creatinine Clearance: 33.9 mL/min (A) (by C-G formula based on SCr of 1.21 mg/dL (H)). Liver Function Tests:  Recent Labs Lab 07/16/17 1540  AST 75*  ALT 44  ALKPHOS 55  BILITOT 0.7  PROT 5.6*  ALBUMIN 2.7*   No results for input(s): LIPASE, AMYLASE in the last 168 hours. No results for input(s): AMMONIA in the  last 168 hours. Coagulation Profile:  Recent Labs Lab 07/16/17 2234  INR 1.90   Cardiac Enzymes: No results for input(s): CKTOTAL, CKMB, CKMBINDEX, TROPONINI in the last 168 hours. BNP (last 3 results) No results for input(s): PROBNP in the last 8760 hours. HbA1C: No results for input(s): HGBA1C in the last 72 hours. CBG:  Recent Labs Lab 07/16/17 1516  GLUCAP 153*   Lipid Profile: No results for input(s): CHOL, HDL, LDLCALC, TRIG, CHOLHDL, LDLDIRECT in the last 72 hours. Thyroid Function Tests: No results for input(s): TSH, T4TOTAL, FREET4, T3FREE, THYROIDAB in the last 72 hours. Anemia Panel: No results for input(s): VITAMINB12, FOLATE, FERRITIN, TIBC, IRON, RETICCTPCT in the last 72 hours. Urine analysis:    Component Value Date/Time   COLORURINE YELLOW 07/16/2017 1642   APPEARANCEUR CLOUDY (A) 07/16/2017 1642   LABSPEC 1.014 07/16/2017 1642   PHURINE 6.0 07/16/2017 1642   GLUCOSEU NEGATIVE 07/16/2017 1642   HGBUR LARGE (A) 07/16/2017 1642   BILIRUBINUR NEGATIVE 07/16/2017 1642   BILIRUBINUR neg 05/08/2017 0952   KETONESUR NEGATIVE 07/16/2017 1642   PROTEINUR 100 (A) 07/16/2017 1642   UROBILINOGEN 0.2 05/08/2017 0952   NITRITE NEGATIVE 07/16/2017 1642   LEUKOCYTESUR LARGE (A) 07/16/2017 1642   Sepsis Labs: @LABRCNTIP (procalcitonin:4,lacticidven:4)  ) Recent Results (from the past 240 hour(s))  Urine culture     Status: Abnormal  (Preliminary result)   Collection Time: 07/16/17  4:42 PM  Result Value Ref Range Status   Specimen Description URINE, RANDOM  Final   Special Requests NONE  Final   Culture (A)  Final    >=100,000 COLONIES/mL GRAM NEGATIVE RODS IDENTIFICATION AND SUSCEPTIBILITIES TO FOLLOW    Report Status PENDING  Incomplete  Blood Culture (routine x 2)     Status: None (Preliminary result)   Collection Time: 07/16/17  5:00 PM  Result Value Ref Range Status   Specimen Description BLOOD RIGHT ANTECUBITAL  Final   Special Requests   Final    BOTTLES DRAWN AEROBIC AND ANAEROBIC Blood Culture adequate volume   Culture NO GROWTH 2 DAYS  Final   Report Status PENDING  Incomplete  Blood Culture (routine x 2)     Status: Abnormal (Preliminary result)   Collection Time: 07/16/17  5:08 PM  Result Value Ref Range Status   Specimen Description BLOOD RIGHT WRIST  Final   Special Requests   Final    BOTTLES DRAWN AEROBIC AND ANAEROBIC Blood Culture adequate volume   Culture  Setup Time   Final    Organism ID to follow GRAM VARIABLE ROD AEROBIC BOTTLE ONLY CRITICAL RESULT CALLED TO, READ BACK BY AND VERIFIED WITH: J. Carney Pharm.D. 16:15 07/18/17 ([email or web address])    Culture GRAM VARIABLE ROD (A)  Final   Report Status PENDING  Incomplete  Blood Culture ID Panel (Reflexed)     Status: None   Collection Time: 07/16/17  5:08 PM  Result Value Ref Range Status   Enterococcus species NOT DETECTED NOT DETECTED Final   Listeria monocytogenes NOT DETECTED NOT DETECTED Final   Staphylococcus species NOT DETECTED NOT DETECTED Final   Staphylococcus aureus NOT DETECTED NOT DETECTED Final   Streptococcus species NOT DETECTED NOT DETECTED Final   Streptococcus agalactiae NOT DETECTED NOT DETECTED Final   Streptococcus pneumoniae NOT DETECTED NOT DETECTED Final   Streptococcus pyogenes NOT DETECTED NOT DETECTED Final   Acinetobacter baumannii NOT DETECTED NOT DETECTED Final   Enterobacteriaceae species NOT DETECTED NOT  DETECTED Final   Enterobacter cloacae complex NOT DETECTED NOT DETECTED  Final   Escherichia coli NOT DETECTED NOT DETECTED Final   Klebsiella oxytoca NOT DETECTED NOT DETECTED Final   Klebsiella pneumoniae NOT DETECTED NOT DETECTED Final   Proteus species NOT DETECTED NOT DETECTED Final   Serratia marcescens NOT DETECTED NOT DETECTED Final   Haemophilus influenzae NOT DETECTED NOT DETECTED Final   Neisseria meningitidis NOT DETECTED NOT DETECTED Final   Pseudomonas aeruginosa NOT DETECTED NOT DETECTED Final   Candida albicans NOT DETECTED NOT DETECTED Final   Candida glabrata NOT DETECTED NOT DETECTED Final   Candida krusei NOT DETECTED NOT DETECTED Final   Candida parapsilosis NOT DETECTED NOT DETECTED Final   Candida tropicalis NOT DETECTED NOT DETECTED Final      Radiology Studies: No results found.   Scheduled Meds: . apixaban  5 mg Oral BID  . atorvastatin  20 mg Oral BH-q7a  . carvedilol  25 mg Oral BID WC  . chlorhexidine  15 mL Mouth Rinse BID  . docusate sodium  100 mg Oral BID  . latanoprost  1 drop Both Eyes QHS  . lidocaine  1 patch Transdermal Q24H  . LORazepam  0.25 mg Oral BID  . mouth rinse  15 mL Mouth Rinse q12n4p  . polyethylene glycol  17 g Oral BID  . tiZANidine  2 mg Oral QHS  . verapamil  180 mg Oral BID   Continuous Infusions: . sodium chloride 75 mL/hr at 07/18/17 1456  . ceFEPime (MAXIPIME) IV Stopped (07/18/17 0830)     LOS: 2 days    Time spent: 30 min    Renae Fickle, MD [facility] Pager [phone]  If 7PM-7AM, please contact night-coverage [email or web address] Password [facility] 07/18/2017, 6:41 PM

## 2017-07-18 NOTE — Progress Notes (Signed)
PT Cancellation Note  Patient Details Name: Christina [name] MRN: [id number] DOB: [birth year]   Cancelled Treatment:    Reason Eval/Treat Not Completed: Medical issues which prohibited therapy. Pt receiving blood transfusion and had low BP. RN requests PT hold until tomorrow for therapy evaluation.    Colin BroachSabra M. Lun Muro PT, DPT  [phone]  07/18/2017, 1:28 PM

## 2017-07-19 ENCOUNTER — Inpatient Hospital Stay (HOSPITAL_COMMUNITY): Payer: Medicare Other

## 2017-07-19 DIAGNOSIS — N189 Chronic kidney disease, unspecified: Secondary | ICD-10-CM | POA: Diagnosis not present

## 2017-07-19 DIAGNOSIS — A4159 Other Gram-negative sepsis: Secondary | ICD-10-CM | POA: Diagnosis not present

## 2017-07-19 DIAGNOSIS — A419 Sepsis, unspecified organism: Secondary | ICD-10-CM | POA: Diagnosis not present

## 2017-07-19 DIAGNOSIS — N10 Acute tubulo-interstitial nephritis: Secondary | ICD-10-CM | POA: Diagnosis not present

## 2017-07-19 DIAGNOSIS — I959 Hypotension, unspecified: Secondary | ICD-10-CM | POA: Diagnosis not present

## 2017-07-19 DIAGNOSIS — N179 Acute kidney failure, unspecified: Secondary | ICD-10-CM | POA: Diagnosis not present

## 2017-07-19 LAB — TYPE AND SCREEN
ABO/RH(D): A POS
ANTIBODY SCREEN: NEGATIVE
Unit division: 0

## 2017-07-19 LAB — CBC
HCT: 25.9 % — ABNORMAL LOW (ref 36.0–46.0)
HEMOGLOBIN: 8.4 g/dL — AB (ref 12.0–15.0)
MCH: 26.8 pg (ref 26.0–34.0)
MCHC: 32.4 g/dL (ref 30.0–36.0)
MCV: 82.7 fL (ref 78.0–100.0)
Platelets: 175 10*3/uL (ref 150–400)
RBC: 3.13 MIL/uL — AB (ref 3.87–5.11)
RDW: 14.8 % (ref 11.5–15.5)
WBC: 8 10*3/uL (ref 4.0–10.5)

## 2017-07-19 LAB — BASIC METABOLIC PANEL
ANION GAP: 6 (ref 5–15)
BUN: 25 mg/dL — ABNORMAL HIGH (ref 6–20)
CHLORIDE: 111 mmol/L (ref 101–111)
CO2: 18 mmol/L — ABNORMAL LOW (ref 22–32)
CREATININE: 1.26 mg/dL — AB (ref 0.44–1.00)
Calcium: 8.4 mg/dL — ABNORMAL LOW (ref 8.9–10.3)
GFR calc non Af Amer: 42 mL/min — ABNORMAL LOW (ref >60)
GFR, EST AFRICAN AMERICAN: 48 mL/min — AB (ref >60)
Glucose, Bld: 114 mg/dL — ABNORMAL HIGH (ref 65–99)
Potassium: 3.7 mmol/L (ref 3.5–5.1)
SODIUM: 135 mmol/L (ref 135–145)

## 2017-07-19 LAB — URINE CULTURE

## 2017-07-19 LAB — BPAM RBC
Blood Product Expiration Date: 201808022359
ISSUE DATE / TIME: 201807271100
Unit Type and Rh: 6200

## 2017-07-19 MED ORDER — SULFAMETHOXAZOLE-TRIMETHOPRIM 800-160 MG PO TABS
1.0000 | ORAL_TABLET | Freq: Two times a day (BID) | ORAL | 0 refills | Status: AC
Start: 2017-07-20 — End: ?

## 2017-07-19 MED ORDER — LORAZEPAM 0.5 MG PO TABS: 0.2500 mg | ORAL_TABLET | Freq: Two times a day (BID) | ORAL | 0 refills | Status: AC

## 2017-07-19 MED ORDER — ENSURE ENLIVE PO LIQD: 237.0000 mL | Freq: Two times a day (BID) | ORAL | 0 refills | Status: AC

## 2017-07-19 MED ORDER — SULFAMETHOXAZOLE-TRIMETHOPRIM 800-160 MG PO TABS: 1.0000 | ORAL_TABLET | Freq: Two times a day (BID) | ORAL | Status: DC

## 2017-07-19 NOTE — Progress Notes (Signed)
Christina [name] to be D/C'd Skilled nursing facility per MD order.  Discussed with the patient and all questions fully answered.  VSS, Skin clean, dry and intact without evidence of skin break down, no evidence of skin tears, scattered bruising noted on pt's legs. IV catheter discontinued intact. Site without signs and symptoms of complications. Dressing and pressure applied.  An After Visit Summary was printed and given to the patient. Patient received prescription.  Report called to Tresa Resobert Newson, RN at [facility]. All questions answered.  Patient escorted via [facility] to [facility].  [name] 07/19/2017 4:14 PM

## 2017-07-19 NOTE — Clinical Social Work Note (Signed)
Clinical Social Worker facilitated patient discharge including contacting patient family and facility to confirm patient discharge plans.  Clinical information faxed to facility and family agreeable with plan.  CSW arranged ambulance transport via [facility] to [facility]. RN to call report prior to discharge.  Clinical Social Worker will sign off for now as social work intervention is no longer needed. Please consult us again if new need arises.  Christina [name],MSW, LCSWA Clinical Social Work Dept Weekend Social Worker [phone] 4:01 PM

## 2017-07-19 NOTE — Evaluation (Signed)
Physical Therapy Evaluation Patient Details Name: Christina [name] MRN: [id number] DOB: 12-15-1946 Today's Date: 07/19/2017   History of Present Illness  Patient is a 71 yo female admitted 07/16/17 with decreased level of alertness and moaning.  Patient with sepsis, possible UTI, potential of evolution of prior CVA, and symptomatic anemia with hypotension.  Clinical Impression  Patient presents with problems listed below.  Patient to discharge to SNF today.  Remainder of PT needs can be met in that venue of care. PT will sign off.    Follow Up Recommendations SNF;Supervision/Assistance - 24 hour    Equipment Recommendations  None recommended by PT    Recommendations for Other Services       Precautions / Restrictions Precautions Precautions: Fall Restrictions Weight Bearing Restrictions: No      Mobility  Bed Mobility Overal bed mobility: Needs Assistance Bed Mobility: Rolling Rolling: Total assist         General bed mobility comments: Attempted rolling with patient to both sides.  Patient unable to initiate movement with verbal, visual, and tactile cueing.  Attempts to initiate rolling by moving UE toward rail, and patient unable to follow.  Transfers                 General transfer comment: NT  Ambulation/Gait                Stairs            Wheelchair Mobility    Modified Rankin (Stroke Patients Only) Modified Rankin (Stroke Patients Only) Pre-Morbid Rankin Score: Moderately severe disability (Unsure - info from chart) Modified Rankin: Severe disability     Balance                                             Pertinent Vitals/Pain Pain Assessment: Faces Faces Pain Scale: No hurt    Home Living Family/patient expects to be discharged to:: Skilled nursing facility                      Prior Function Level of Independence: Needs assistance         Comments: Per chart, patient was receiving assist from  daughter for all ADL's.     Hand Dominance   Dominant Hand: Right    Extremity/Trunk Assessment   Upper Extremity Assessment Upper Extremity Assessment: Difficult to assess due to impaired cognition (No active movement noted. )    Lower Extremity Assessment Lower Extremity Assessment: Difficult to assess due to impaired cognition (No active movement noted.)       Communication   Communication: Receptive difficulties;Expressive difficulties  Cognition Arousal/Alertness: Lethargic Behavior During Therapy: Flat affect ("Non-verbal") Overall Cognitive Status: No family/caregiver present to determine baseline cognitive functioning                                 General Comments: Patient unable to follow commands.       General Comments      Exercises     Assessment/Plan    PT Assessment All further PT needs can be met in the next venue of care  PT Problem List Decreased strength;Decreased range of motion;Decreased activity tolerance;Decreased balance;Decreased mobility;Decreased cognition;Decreased knowledge of use of DME;Decreased safety awareness       PT Treatment Interventions  PT Goals (Current goals can be found in the Care Plan section)  Acute Rehab PT Goals Patient Stated Goal: Unable to state PT Goal Formulation:  (Patient to have f/u PT at SNF)    Frequency     Barriers to discharge        Co-evaluation               AM-PAC PT "6 Clicks" Daily Activity  Outcome Measure Difficulty turning over in bed (including adjusting bedclothes, sheets and blankets)?: Total Difficulty moving from lying on back to sitting on the side of the bed? : Total Difficulty sitting down on and standing up from a chair with arms (e.g., wheelchair, bedside commode, etc,.)?: Total Help needed moving to and from a bed to chair (including a wheelchair)?: Total Help needed walking in hospital room?: Total Help needed climbing 3-5 steps with a railing? :  Total 6 Click Score: 6    End of Session   Activity Tolerance: Patient limited by lethargy Patient left: in bed;with call bell/phone within reach;with bed alarm set   PT Visit Diagnosis: Muscle weakness (generalized) (M62.81);Difficulty in walking, not elsewhere classified (R26.2);Other symptoms and signs involving the nervous system (R29.898)    Time: 0240-9735 PT Time Calculation (min) (ACUTE ONLY): 14 min   Charges:   PT Evaluation $PT Eval Moderate Complexity: 1 Procedure     PT G Codes:        Carita Pian. Sanjuana Kava, [facility] Acute Rehab Services Pager [place] 07/19/2017, 11:13 PM

## 2017-07-19 NOTE — Progress Notes (Signed)
Pharmacy Antibiotic Note  Christina [name] is a 71 y.o. female admitted on 07/16/2017 with uro-sepsis. Received one dose of ceftriaxone in the ED. Pt was admitted in June and had a proteus UTI - that was sensitive to most antimicrobials. Pt with AKI on chronic CKD on admission, Scr now returning to baseline ~1.2. Current eCrCl 30 ml/min.  BC[facility] notified pharmacy that 1 of 4 bottles with Gram variable rods (aerobic bottle), likely a contaminant.  Plan: - Continue Cefepime 1 g IV q24h - Monitor cultures + sensitivities, deescalate as appropriate - Follow-up on length of treatment   Height: 4\' 10"  (147.3 cm) Weight: 141 lb 12.8 oz (64.3 kg) IBW/kg (Calculated) : 40.9  Temp (24hrs), Avg:99.1 F (37.3 C), Min:98.5 F (36.9 C), Max:100.2 F (37.9 C)   Recent Labs Lab 07/16/17 1540 07/16/17 1553 07/16/17 1909 07/16/17 2234 07/17/17 0118 07/18/17 0431 07/19/17 0407  WBC 9.9  --   --   --  5.9 6.2 8.0  CREATININE 1.66*  --   --   --  1.32* 1.21* 1.26*  LATICACIDVEN  --  2.01* 1.06 1.2 0.9  --   --     Estimated Creatinine Clearance: 32.5 mL/min (A) (by C-G formula based on SCr of 1.26 mg/dL (H)).    No Known Allergies  Antimicrobials this admission: 7/25 ceftriaxone x1 7/25 cefepime >  Dose adjustments this admission: N/A  Microbiology results: 7/25 blood cx: 7/25 urine cx:   Christina [name] 07/19/2017 11:11 AM

## 2017-07-19 NOTE — Discharge Summary (Addendum)
Physician Discharge Summary  Christina [name] ZOX:[id number] DOB: 09/11/1946 DOA: 07/16/2017  PCP: Lizbeth Bark, FNP  Admit date: 07/16/2017 Discharge date: 07/19/2017  Admitted From: home  Disposition:  SNF  Recommendations for Outpatient Follow-up:  1. Continue bactrim through August 4th 2. Follow up final results of blood cultures 3. Repeat BMP and CBC in 5 days 4. May need to gradually resume some of her antihypertensives as she recovers  Home Health:  PT/OT Equipment/Devices:  None  Discharge Condition:  Stable, improved CODE STATUS:  Full code  Diet recommendation:  Dysphagia 3 with thin    Dysphagia 3 (Mech soft);Thin liquid   Liquid Administration via: Cup Medication Administration: Whole meds with puree Supervision: Staff to assist with self feeding;Full supervision/cueing for compensatory strategies Compensations: Slow rate;Small sips/bites Postural Changes: Remain upright for at least 30 minutes after po intake;Seated upright at 90 degrees   Brief/Interim Summary: The patient is a 71 year old female with history of paroxysmal A. fib on Apixaban, history of CVA who is generally nonverbal but is able to nod head and sitting along with songs, hypertension, dyslipidemia, recurrent cystitis who moved to [place] in [place]. 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 She was admitted from June 13-15 of this year for an acute ischemic stroke after which she was deemed 'nonverbal.'  The daughter has been assisting her with toileting, transfers, and most ADLs. There were at the doctor's office getting a bone scan when her daughter noticed that she was moaning and acting like she was in pain. She seemed pale. Leg home, the daughter gave her mother a lidocaine patch to her back because she thought maybe her mother's back was hurt while lying on the scanner. Her mom continued to be less responsive, pale and continued to  moans some the daughter has a visiting nurse check her blood pressure which was 70/50. EMS was called. In the emergency department her blood pressure had improved, her lactate was 2. A CT of the head demonstrated evolution of her previous stroke but no evidence of new lesions. Her urinalysis was concerning for urinary tract infection and she was started on antibiotics. Urine culture grew Enterobacter.  Given her fevers, back pain, and hypotension, she likely had pyelonephritis.  Since admission, the patient has become more alert and has been interacting with her daughter and singing along to songs on the radio.  Her hospitalization was complicated but acute anemia without blood loss.  Her occult stool was negative and there was no overt bleeding.  Her CT ab/p demonstrated no evidence of retroperitoneal hemorrhage.  I suspect she had some marrow suppression of very mild DIC from sepsis.  Her platelets remained normal.  She was transfused 1 unit of blood and her hgb has been stable at 8.2-8.4g/dl for 24 hours.  Labs demonstrated elevated iron levels post transfusion, normal b12, folate, and TSH.  Follow up with PCP for ongoing work up if persistent.    Discharge Diagnoses:  Principal Problem:   Sepsis (HCC) Active Problems:   History of CVA (cerebrovascular accident)   Anemia   Acute kidney injury superimposed on chronic kidney disease (HCC)   Uncontrolled hypertension   Altered mental status   AF (paroxysmal atrial fibrillation) (HCC)   Acute lower UTI   Hyperglycemia   Severe protein-calorie malnutrition (HCC)  Sepsis due to Enterobacter pyelonephritis -Tachypneawith elevated lactate to 2.01 (repeat 1.06)and hypotension, resolved - Blood cultures no growth to date except  1 of the 4 grew gram variable rods and was likely a contaminant -  Urine culture > 100 000 colonies Enterobacter - Given Cefepime initially and transitioned to bactrim based on sensitivities  Altered mental status, improving  and close to baseline according to daughter.  Was likely secondary to urinary tract infection and possibly some dehydration.  AKI on CKD stage 3, creatinine trended down from 1.66-1.32 (baseline) with IV fluids  Symptomatic Anemia with hypotension.  Baseline hemoglobin of 10.7 on 7/3. Hemoglobin trending down.   -  Transfused 1 unit PRBC on 7/27 -  Occult stool was negative -  Iron, b12, folate, TSH wnl -  temporarily held anticoagulation due to concern for hemorrhage initially but now resumed  -  CT a/p negative for retroperitoneal hemorrhage  HTN, hypotensive due to sepsis - Continued Coreg, but discontinued hydralazine, losartan, norvasc, and verapamil  Afib -Patient with multiple CVAs resulting apparently from afib and inconsistent use of Eliquis - continue eliquis  Hyperglycemia -Glucose 127 -A1c was 5.7 in 3/18  Malnutrition -Albumin 2.7; prior 3.2 on 6/18 - liberalize diet and continue supplements  Discharge Instructions     Medication List    STOP taking these medications   amLODipine 10 MG tablet Commonly known as:  NORVASC   hydrALAZINE 10 MG tablet Commonly known as:  APRESOLINE   losartan 100 MG tablet Commonly known as:  COZAAR   verapamil 180 MG CR tablet Commonly known as:  CALAN-SR     TAKE these medications   apixaban 5 MG Tabs tablet Commonly known as:  ELIQUIS Take 1 tablet (5 mg total) by mouth 2 (two) times daily.   atorvastatin 20 MG tablet Commonly known as:  LIPITOR Take 1 tablet (20 mg total) by mouth daily at 6 PM. What changed:  when to take this   carvedilol 25 MG tablet Commonly known as:  COREG Take 1 tablet (25 mg total) by mouth 2 (two) times daily with a meal.   feeding supplement (ENSURE ENLIVE) Liqd Take 237 mLs by mouth 2 (two) times daily between meals.   latanoprost 0.005 % ophthalmic solution Commonly known as:  XALATAN Place 1 drop into both eyes every morning. What changed:  when to take this    LORazepam 0.5 MG tablet Commonly known as:  ATIVAN Take 0.5 tablets (0.25 mg total) by mouth 2 (two) times daily.   sulfamethoxazole-trimethoprim 800-160 MG tablet Commonly known as:  BACTRIM DS,SEPTRA DS Take 1 tablet by mouth every 12 (twelve) hours.   tiZANidine 2 MG tablet Commonly known as:  ZANAFLEX Take 1 tablet (2 mg total) by mouth at bedtime.   traZODone 50 MG tablet Commonly known as:  DESYREL Take 1 tablet (50 mg total) by mouth at bedtime as needed for sleep.   VOLTAREN 1 % Gel Generic drug:  diclofenac sodium Apply 2 g topically 4 (four) times daily.       Contact information for follow-up providers    Lizbeth Bark, FNP Follow up.   Specialty:  Family Medicine Contact information: [address] [place] [address] [phone]            Contact information for after-discharge care    Destination    [facility] Follow up.   Specialty:  Skilled Nursing Chief of Staff information: [address] [place] [address] [phone]                 No Known Allergies  Consultations: none  Procedures/Studies: Ct Abdomen Pelvis Wo Contrast  Result Date: 07/19/2017 CLINICAL DATA:  Anemia, decreasing hemoglobin, concern for unexplained hemorrhage, back pain EXAM: CT ABDOMEN AND PELVIS WITHOUT CONTRAST TECHNIQUE: Multidetector CT imaging of the abdomen and pelvis was performed following the standard protocol without IV contrast. COMPARISON:  None available FINDINGS: Lower chest: Limited with motion artifact. Trace bilateral pleural effusions but slightly larger on the left. Minor dependent bibasilar atelectasis. Mitral valve annular calcifications noted. Mild cardiomegaly. No pericardial effusion. No aortic atherosclerosis evident. Hepatobiliary: No focal liver abnormality is seen. No gallstones, gallbladder wall thickening, or biliary dilatation. Pancreas: Unremarkable. No pancreatic ductal  dilatation or surrounding inflammatory changes. Spleen: Normal in size without focal abnormality. Adrenals/Urinary Tract: Normal adrenal glands for age. Both kidneys demonstrate extensive renal vascular calcifications from atherosclerosis. No acute obstructive uropathy, hydronephrosis, hydroureter, or associated obstructing ureteral calculus. Urinary bladder unremarkable. Left renal atrophy evident. Renal cortical nodularity present bilaterally, suspect a component of cortical atrophy and small scattered renal cysts, incompletely characterized by noncontrast imaging. Stomach/Bowel: Stomach is within normal limits. Appendix appears normal. No evidence of bowel wall thickening, distention, or inflammatory changes. Vascular/Lymphatic: Abdominal atherosclerosis evident. Negative for aneurysm. No retroperitoneal hemorrhage or hematoma. Reproductive: Remote hysterectomy.  No adnexal mass. Other: No abdominal wall hernia or abnormality. No abdominopelvic ascites. Musculoskeletal: Degenerative changes noted of the spine. No acute compression fracture. No acute osseous finding. IMPRESSION: No acute intra-abdominal or pelvic finding by noncontrast CT. Small pleural effusions and bibasilar atelectasis Abdominal atherosclerosis No acute intra-abdominal or pelvic hematoma. No retroperitoneal abnormality. Electronically Signed   By: Judie Petit.  Shick M.D.   On: 07/19/2017 09:54   Ct Head Wo Contrast  Result Date: 07/16/2017 CLINICAL DATA:  Altered mental status EXAM: CT HEAD WITHOUT CONTRAST TECHNIQUE: Contiguous axial images were obtained from the base of the skull through the vertex without intravenous contrast. COMPARISON:  MRI  06/04/2017, CT brain 06/04/2017 FINDINGS: Brain: No large hemorrhage is visualized. Hypodensity within the left frontal lobe, may relate to evolving stroke noted on the comparison MRI. Punctate increased densities are noted in the region. Additional hypodensity within the right white matter, also likely  reflects involving ischemic changes from prior stroke noted on the MRI. Atrophy. Small vessel ischemic changes of the white matter. No midline shift. Old right frontal lobe infarct. Vascular: No hyperdense vessels.  Carotid artery calcifications. Skull: No fracture. Sinuses/Orbits: Mild mucosal thickening in the ethmoid sinuses. No acute orbital abnormality. Other: None IMPRESSION: 1. Study is limited by patient motion and unconventional scanned plane. 2. Hypodensities within the left frontal lobe and right pericallosal white matter, suspect that this represents evolutional changes of previously noted infarcts on the MRI from June 2018. Petechial foci of increased density in the left frontal lobe hypodensity could reflect petechial hemorrhage/necrosis. MRI follow-up as clinically indicated. 3. Atrophy and small vessel ischemic changes of the white matter Electronically Signed   By: Jasmine Pang M.D.   On: 07/16/2017 17:59   Dg Bone Density  Result Date: 07/15/2017 EXAM: DUAL X-RAY ABSORPTIOMETRY (DXA) FOR BONE MINERAL DENSITY IMPRESSION: Referring Physician:  Lizbeth Bark PATIENT: Name: Sosie, Gato Patient ID: [id number] Birth Date: [birth year] Height: 62.0 in. Sex: Female Measured: 07/15/2017 Weight: 140.0 lbs. Indications: Advanced Age, Bilateral Ovariectomy (65.51), Estrogen Deficient, Hysterectomy, Postmenopausal Fractures: None Treatments: Calcium (E943.0) ASSESSMENT: The BMD measured at AP Spine L1-L4 is 0.893 g/cm2 with a T-score of -2.4. This patient is considered osteopenic according to World Health Organization [facility]) criteria. Site Region Measured Date Measured  Age YA BMD Significant CHANGE T-score AP Spine  L1-L4      07/15/2017    [age]         -2.4    0.893 g/cm2 DualFemur Total Left 07/15/2017    [age]         -1.8    0.782 g/cm2 World Health Organization [facility](WHO) criteria for post-menopausal, Caucasian Women: Normal       T-score at or above -1 SD Osteopenia   T-score between -1 and -2.5 SD  Osteoporosis T-score at or below -2.5 SD RECOMMENDATION: National Osteoporosis Foundation recommends that FDA-approved medical therapies be considered in postmenopausal women and men age 71 or older with a: 1. Hip or vertebral (clinical or morphometric) fracture. 2. T-score of <-2.5 at the spine or hip. 3. Ten-year fracture probability by FRAX of 3% or greater for hip fracture or 20% or greater for major osteoporotic fracture. All treatment decisions require clinical judgment and consideration of individual patient factors, including patient preferences, co-morbidities, previous drug use, risk factors not captured in the FRAX model (e.g. falls, vitamin D deficiency, increased bone turnover, interval significant decline in bone density) and possible under - or over-estimation of fracture risk by FRAX. All patients should ensure an adequate intake of dietary calcium (1200 mg/d) and vitamin D (800 IU daily) unless contraindicated. FOLLOW-UP: People with diagnosed cases of osteoporosis or at high risk for fracture should have regular bone mineral density tests. For patients eligible for Medicare, routine testing is allowed once every 2 years. The testing frequency can be increased to one year for patients who have rapidly progressing disease, those who are receiving or discontinuing medical therapy to restore bone mass, or have additional risk factors. I have reviewed this report, and agree with the above findings. [facility] FRAX* 10-year Probability of Fracture Based on femoral neck BMD: DualFemur (Right) Major Osteoporotic Fracture: 6.2% Hip Fracture:                1.1% Population:                  [place] (Hispanic) Risk Factors:                None *FRAX is a Armed forces logistics/support/administrative offic[employer]rademark of the [employer] for Metabolic Bone Disease, a World Science writerHealth Organization (WHO) [employer]. ASSESSMENT: The probability of a major osteoporotic fracture is 6.2 % within the next ten years. The  probability of a hip fracture is 1.1 % within the next ten years. Electronically Signed   By: Lupita RaiderJames  Green Jr, M.D.   On: 07/15/2017 15:33   Dg Chest Port 1 View  Result Date: 07/16/2017 CLINICAL DATA:  Altered mental status EXAM: PORTABLE CHEST 1 VIEW COMPARISON:  05/27/2017 FINDINGS: Mild cardiomegaly. No infiltrate or effusion. Aortic atherosclerosis. No pneumothorax. IMPRESSION: Cardiomegaly without edema or infiltrate. Electronically Signed   By: Jasmine PangKim  Fujinaga M.D.   On: 07/16/2017 15:56   Subjective: nonverbal  Discharge Exam: Vitals:   07/19/17 0630 07/19/17 1418  BP: 124/63 132/60  Pulse: 88 80  Resp: 18 16  Temp: 100.2 F (37.9 C) 98.4 F (36.9 C)   Vitals:   07/18/17 1734 07/18/17 2045 07/19/17 0630 07/19/17 1418  BP: (!) 123/44 (!) 141/69 124/63 132/60  Pulse: 69 70 88 80  Resp: 20  18 16   Temp: 98.5 F (36.9 C) 98.5 F (36.9 C) 100.2 F (37.9 C) 98.4 F (36.9 C)  TempSrc: Oral Oral Oral Oral  SpO2: 97% 97%  93% 96%  Weight:      Height:        General exam:  Adult Female.  No acute distress.  Awake, alert, lying in bed HEENT:  NCAT, MMM Respiratory system: Clear to auscultation bilaterally Cardiovascular system:  regular rate and rhythm, no murmurs, rubs, or gallops, warm extremities Gastrointestinal system:  normal active bowel sounds, soft, nondistended, nontender  MSK:   normal tone and bulk, no lower extremity edema Neuro:   left hemiparesis with left facial droop.  Unable to answer questions or speak in sentences.  She does seem to engage nonverbally with her daughter and will sing along to the radio in [place].    The results of significant diagnostics from this hospitalization (including imaging, microbiology, ancillary and laboratory) are listed below for reference.     Microbiology: Recent Results (from the past 240 hour(s))  Urine culture     Status: Abnormal   Collection Time: 07/16/17  4:42 PM  Result Value Ref Range Status   Specimen  Description URINE, RANDOM  Final   Special Requests NONE  Final   Culture >=100,000 COLONIES/mL ENTEROBACTER CLOACAE (A)  Final   Report Status 07/19/2017 FINAL  Final   Organism ID, Bacteria ENTEROBACTER CLOACAE (A)  Final      Susceptibility   Enterobacter cloacae - MIC*    CEFAZOLIN RESISTANT Resistant     CEFTRIAXONE <=1 SENSITIVE Sensitive     CIPROFLOXACIN <=0.25 SENSITIVE Sensitive     GENTAMICIN <=1 SENSITIVE Sensitive     IMIPENEM <=0.25 SENSITIVE Sensitive     NITROFURANTOIN 32 SENSITIVE Sensitive     TRIMETH/SULFA <=20 SENSITIVE Sensitive     PIP/TAZO 8 SENSITIVE Sensitive     * >=100,000 COLONIES/mL ENTEROBACTER CLOACAE  Blood Culture (routine x 2)     Status: None (Preliminary result)   Collection Time: 07/16/17  5:00 PM  Result Value Ref Range Status   Specimen Description BLOOD RIGHT ANTECUBITAL  Final   Special Requests   Final    BOTTLES DRAWN AEROBIC AND ANAEROBIC Blood Culture adequate volume   Culture NO GROWTH 3 DAYS  Final   Report Status PENDING  Incomplete  Blood Culture (routine x 2)     Status: Abnormal (Preliminary result)   Collection Time: 07/16/17  5:08 PM  Result Value Ref Range Status   Specimen Description BLOOD RIGHT WRIST  Final   Special Requests   Final    BOTTLES DRAWN AEROBIC AND ANAEROBIC Blood Culture adequate volume   Culture  Setup Time (A)  Final    GRAM VARIABLE ROD AEROBIC BOTTLE ONLY CRITICAL RESULT CALLED TO, READ BACK BY AND VERIFIED WITH: J. Carney Pharm.D. 16:15 07/18/17 ([email or web address])    Culture GRAM POSITIVE RODS IDENTIFICATION TO FOLLOW   Final   Report Status PENDING  Incomplete  Blood Culture ID Panel (Reflexed)     Status: None   Collection Time: 07/16/17  5:08 PM  Result Value Ref Range Status   Enterococcus species NOT DETECTED NOT DETECTED Final   Listeria monocytogenes NOT DETECTED NOT DETECTED Final   Staphylococcus species NOT DETECTED NOT DETECTED Final   Staphylococcus aureus NOT DETECTED NOT DETECTED Final    Streptococcus species NOT DETECTED NOT DETECTED Final   Streptococcus agalactiae NOT DETECTED NOT DETECTED Final   Streptococcus pneumoniae NOT DETECTED NOT DETECTED Final   Streptococcus pyogenes NOT DETECTED NOT DETECTED Final   Acinetobacter baumannii NOT DETECTED NOT DETECTED Final   Enterobacteriaceae species NOT DETECTED NOT DETECTED Final  Enterobacter cloacae complex NOT DETECTED NOT DETECTED Final   Escherichia coli NOT DETECTED NOT DETECTED Final   Klebsiella oxytoca NOT DETECTED NOT DETECTED Final   Klebsiella pneumoniae NOT DETECTED NOT DETECTED Final   Proteus species NOT DETECTED NOT DETECTED Final   Serratia marcescens NOT DETECTED NOT DETECTED Final   Haemophilus influenzae NOT DETECTED NOT DETECTED Final   Neisseria meningitidis NOT DETECTED NOT DETECTED Final   Pseudomonas aeruginosa NOT DETECTED NOT DETECTED Final   Candida albicans NOT DETECTED NOT DETECTED Final   Candida glabrata NOT DETECTED NOT DETECTED Final   Candida krusei NOT DETECTED NOT DETECTED Final   Candida parapsilosis NOT DETECTED NOT DETECTED Final   Candida tropicalis NOT DETECTED NOT DETECTED Final     Labs: BNP (last 3 results) No results for input(s): BNP in the last 8760 hours. Basic Metabolic Panel:  Recent Labs Lab 07/16/17 1540 07/17/17 0118 07/18/17 0431 07/19/17 0407  NA 135 136 139 135  K 3.5 3.5 3.9 3.7  CL 106 109 113* 111  CO2 20* 20* 20* 18*  GLUCOSE 127* 111* 111* 114*  BUN 35* 32* 25* 25*  CREATININE 1.66* 1.32* 1.21* 1.26*  CALCIUM 8.6* 8.7* 8.6* 8.4*   Liver Function Tests:  Recent Labs Lab 07/16/17 1540  AST 75*  ALT 44  ALKPHOS 55  BILITOT 0.7  PROT 5.6*  ALBUMIN 2.7*   No results for input(s): LIPASE, AMYLASE in the last 168 hours. No results for input(s): AMMONIA in the last 168 hours. CBC:  Recent Labs Lab 07/16/17 1540 07/17/17 0118 07/18/17 0431 07/18/17 2007 07/19/17 0407  WBC 9.9 5.9 6.2  --  8.0  NEUTROABS 8.2*  --   --   --   --    HGB 8.6* 8.1* 7.2* 8.2* 8.4*  HCT 26.4* 25.4* 22.4* 25.7* 25.9*  MCV 81.0 80.4 81.8  --  82.7  PLT 227 181 156  --  175   Cardiac Enzymes: No results for input(s): CKTOTAL, CKMB, CKMBINDEX, TROPONINI in the last 168 hours. BNP: Invalid input(s): POCBNP CBG:  Recent Labs Lab 07/16/17 1516  GLUCAP 153*   D-Dimer No results for input(s): DDIMER in the last 72 hours. Hgb A1c No results for input(s): HGBA1C in the last 72 hours. Lipid Profile No results for input(s): CHOL, HDL, LDLCALC, TRIG, CHOLHDL, LDLDIRECT in the last 72 hours. Thyroid function studies  Recent Labs  07/18/17 2007  TSH 3.748   Anemia work up  Recent Labs  07/18/17 2007  VITAMINB12 512  FOLATE 6.9  FERRITIN 124  TIBC 211*  IRON 171*   Urinalysis    Component Value Date/Time   COLORURINE YELLOW 07/16/2017 1642   APPEARANCEUR CLOUDY (A) 07/16/2017 1642   LABSPEC 1.014 07/16/2017 1642   PHURINE 6.0 07/16/2017 1642   GLUCOSEU NEGATIVE 07/16/2017 1642   HGBUR LARGE (A) 07/16/2017 1642   BILIRUBINUR NEGATIVE 07/16/2017 1642   BILIRUBINUR neg 05/08/2017 0952   KETONESUR NEGATIVE 07/16/2017 1642   PROTEINUR 100 (A) 07/16/2017 1642   UROBILINOGEN 0.2 05/08/2017 0952   NITRITE NEGATIVE 07/16/2017 1642   LEUKOCYTESUR LARGE (A) 07/16/2017 1642   Sepsis Labs Invalid input(s): PROCALCITONIN,  WBC,  LACTICIDVEN   Time coordinating discharge: Over 30 minutes  SIGNED:   Renae Fickle, MD  [facility] 07/19/2017, 3:11 PM Pager   If 7PM-7AM, please contact night-coverage [email or web address] Password [email or web address]

## 2017-07-19 NOTE — Progress Notes (Signed)
Stratus video interpreting used to give medication.

## 2017-07-19 NOTE — Progress Notes (Signed)
Stratus video interpreting used assess pt and give medication.

## 2017-07-19 NOTE — Progress Notes (Signed)
OT Cancellation Note  Patient Details Name: Hardin Negusrinda Baglio MRN: [id number] DOB: Jul 26, 1946   Cancelled Treatment:    Reason Eval/Treat Not Completed: Patient at procedure or test/ unavailable. Will reattempt OT evaluation as schedule permits.  Raynald KempKathryn Mayumi Summerson OTR/L Pager: [phone]  07/19/2017, 8:58 AM

## 2017-07-19 NOTE — Clinical Social Work Placement (Signed)
   CLINICAL SOCIAL WORK PLACEMENT  NOTE  Date:  07/19/2017  Patient Details  Name: Christina [name] MRN: [id number] Date of Birth: February 03, 1946  Clinical Social Work is seeking post-discharge placement for this patient at the Skilled  Nursing Facility level of care (*CSW will initial, date and re-position this form in  chart as items are completed):  Yes   Patient/family provided with [place] Clinical Social Work Department's list of facilities offering this level of care within the geographic area requested by the patient (or if unable, by the patient's family).  Yes   Patient/family informed of their freedom to choose among providers that offer the needed level of care, that participate in Medicare, Medicaid or managed care program needed by the patient, have an available bed and are willing to accept the patient.  Yes   Patient/family informed of [facility]'s ownership interest in [facility] and [facility], as well as of the fact that they are under no obligation to receive care at these facilities.  PASRR submitted to EDS on       PASRR number received on       Existing PASRR number confirmed on 07/17/17     FL2 transmitted to all facilities in geographic area requested by pt/family on 07/17/17     FL2 transmitted to all facilities within larger geographic area on       Patient informed that his/her managed care company has contracts with or will negotiate with certain facilities, including the following:        Yes   Patient/family informed of bed offers received.  [facility]     Physician recommends and patient chooses bed at      [facility] on 07/19/17.  Patient to be transferred to facility by [facility]     Patient family notified on 07/19/17 of transfer.  Name of family member notified:  EC     PHYSICIAN       Additional Comment:     _______________________________________________ Norlene DuelBROWN, Khamya Topp B, LCSWA 07/19/2017, 4:02 PM

## 2017-07-20 ENCOUNTER — Emergency Department (HOSPITAL_COMMUNITY)
Admission: EM | Admit: 2017-07-20 | Discharge: 2017-07-23 | Disposition: E | Payer: Medicare Other | Attending: Emergency Medicine | Admitting: Emergency Medicine

## 2017-07-20 ENCOUNTER — Emergency Department (HOSPITAL_COMMUNITY): Payer: Medicare Other

## 2017-07-20 DIAGNOSIS — Z87891 Personal history of nicotine dependence: Secondary | ICD-10-CM | POA: Diagnosis not present

## 2017-07-20 DIAGNOSIS — I129 Hypertensive chronic kidney disease with stage 1 through stage 4 chronic kidney disease, or unspecified chronic kidney disease: Secondary | ICD-10-CM | POA: Diagnosis not present

## 2017-07-20 DIAGNOSIS — Z79899 Other long term (current) drug therapy: Secondary | ICD-10-CM | POA: Insufficient documentation

## 2017-07-20 DIAGNOSIS — Z8673 Personal history of transient ischemic attack (TIA), and cerebral infarction without residual deficits: Secondary | ICD-10-CM | POA: Diagnosis not present

## 2017-07-20 DIAGNOSIS — N183 Chronic kidney disease, stage 3 (moderate): Secondary | ICD-10-CM | POA: Insufficient documentation

## 2017-07-20 DIAGNOSIS — I469 Cardiac arrest, cause unspecified: Secondary | ICD-10-CM | POA: Diagnosis present

## 2017-07-20 LAB — CULTURE, BLOOD (ROUTINE X 2): SPECIAL REQUESTS: ADEQUATE

## 2017-07-20 LAB — COMPREHENSIVE METABOLIC PANEL
ALBUMIN: 1.5 g/dL — AB (ref 3.5–5.0)
ALK PHOS: 76 U/L (ref 38–126)
ALT: 219 U/L — AB (ref 14–54)
AST: 284 U/L — AB (ref 15–41)
Anion gap: 16 — ABNORMAL HIGH (ref 5–15)
BILIRUBIN TOTAL: 0.8 mg/dL (ref 0.3–1.2)
BUN: 25 mg/dL — AB (ref 6–20)
CO2: 16 mmol/L — ABNORMAL LOW (ref 22–32)
CREATININE: 1.81 mg/dL — AB (ref 0.44–1.00)
Calcium: 8.9 mg/dL (ref 8.9–10.3)
Chloride: 112 mmol/L — ABNORMAL HIGH (ref 101–111)
GFR calc Af Amer: 31 mL/min — ABNORMAL LOW (ref >60)
GFR, EST NON AFRICAN AMERICAN: 27 mL/min — AB (ref >60)
GLUCOSE: 199 mg/dL — AB (ref 65–99)
POTASSIUM: 5.3 mmol/L — AB (ref 3.5–5.1)
Sodium: 144 mmol/L (ref 135–145)
Total Protein: 4.2 g/dL — ABNORMAL LOW (ref 6.5–8.1)

## 2017-07-20 LAB — CBC WITH DIFFERENTIAL/PLATELET
BASOS ABS: 0 10*3/uL (ref 0.0–0.1)
BLASTS: 0 %
Band Neutrophils: 0 %
Basophils Relative: 0 %
EOS PCT: 0 %
Eosinophils Absolute: 0 10*3/uL (ref 0.0–0.7)
HEMATOCRIT: 25.1 % — AB (ref 36.0–46.0)
HEMOGLOBIN: 7.9 g/dL — AB (ref 12.0–15.0)
Lymphocytes Relative: 16 %
Lymphs Abs: 4 10*3/uL (ref 0.7–4.0)
MCH: 27.1 pg (ref 26.0–34.0)
MCHC: 31.5 g/dL (ref 30.0–36.0)
MCV: 86 fL (ref 78.0–100.0)
METAMYELOCYTES PCT: 0 %
MYELOCYTES: 0 %
Monocytes Absolute: 0.5 10*3/uL (ref 0.1–1.0)
Monocytes Relative: 2 %
NEUTROS PCT: 82 %
NRBC: 0 /100{WBCs}
Neutro Abs: 20.2 10*3/uL — ABNORMAL HIGH (ref 1.7–7.7)
Other: 0 %
PROMYELOCYTES ABS: 0 %
Platelets: 157 10*3/uL (ref 150–400)
RBC: 2.92 MIL/uL — AB (ref 3.87–5.11)
RDW: 15.1 % (ref 11.5–15.5)
WBC MORPHOLOGY: INCREASED
WBC: 24.7 10*3/uL — AB (ref 4.0–10.5)

## 2017-07-20 LAB — I-STAT CHEM 8, ED
BUN: 31 mg/dL — ABNORMAL HIGH (ref 6–20)
CREATININE: 1.4 mg/dL — AB (ref 0.44–1.00)
Calcium, Ion: 1.28 mmol/L (ref 1.15–1.40)
Chloride: 114 mmol/L — ABNORMAL HIGH (ref 101–111)
Glucose, Bld: 187 mg/dL — ABNORMAL HIGH (ref 65–99)
HCT: 25 % — ABNORMAL LOW (ref 36.0–46.0)
Hemoglobin: 8.5 g/dL — ABNORMAL LOW (ref 12.0–15.0)
POTASSIUM: 4.9 mmol/L (ref 3.5–5.1)
Sodium: 141 mmol/L (ref 135–145)
TCO2: 15 mmol/L (ref 0–100)

## 2017-07-20 LAB — I-STAT TROPONIN, ED: Troponin i, poc: 7.88 ng/mL (ref 0.00–0.08)

## 2017-07-20 LAB — HAPTOGLOBIN: Haptoglobin: 193 mg/dL (ref 34–200)

## 2017-07-20 LAB — CBG MONITORING, ED: GLUCOSE-CAPILLARY: 175 mg/dL — AB (ref 65–99)

## 2017-07-20 LAB — I-STAT CG4 LACTIC ACID, ED: LACTIC ACID, VENOUS: 10.14 mmol/L — AB (ref 0.5–1.9)

## 2017-07-20 MED ORDER — SODIUM BICARBONATE 8.4 % IV SOLN
INTRAVENOUS | Status: AC | PRN
  Administered 2017-07-20 (×2): 50 meq via INTRAVENOUS

## 2017-07-20 MED ORDER — NOREPINEPHRINE BITARTRATE 1 MG/ML IV SOLN
0.0000 ug/min | Freq: Once | INTRAVENOUS | Status: AC
  Administered 2017-07-20: 10 ug/min via INTRAVENOUS
  Filled 2017-07-20: qty 4

## 2017-07-20 MED ORDER — EPINEPHRINE PF 1 MG/10ML IJ SOSY
PREFILLED_SYRINGE | INTRAMUSCULAR | Status: AC | PRN
  Administered 2017-07-20 (×3): 1 mg via INTRAVENOUS

## 2017-07-21 LAB — CULTURE, BLOOD (ROUTINE X 2)
Culture: NO GROWTH
Special Requests: ADEQUATE

## 2017-07-21 MED FILL — Medication: Qty: 1 | Status: AC

## 2017-07-22 ENCOUNTER — Inpatient Hospital Stay: Payer: Self-pay | Admitting: Physical Medicine & Rehabilitation

## 2017-07-23 ENCOUNTER — Other Ambulatory Visit: Payer: Self-pay | Admitting: Family Medicine

## 2017-07-23 NOTE — Code Documentation (Signed)
[facility] has been coding the pt for one hour plus. Ems gave  epie 9  Calcium chloride given by  Ems.

## 2017-07-23 NOTE — Code Documentation (Signed)
Family at beside. Family given emotional support. 

## 2017-07-23 NOTE — Code Documentation (Signed)
cpr continues

## 2017-07-23 NOTE — Code Documentation (Signed)
cpr started again 

## 2017-07-23 NOTE — Code Documentation (Signed)
Levophed drip at 10 mcg iv anna rn

## 2017-07-23 NOTE — Code Documentation (Signed)
Pt has a heart rate now  Intubated by ems at the scene  Io lt tibia

## 2017-07-23 NOTE — Code Documentation (Signed)
cbg 175 

## 2017-07-23 NOTE — ED Triage Notes (Signed)
cpr in [place]

## 2017-07-23 NOTE — Code Documentation (Signed)
pronoounced [phone]  By dr Blinda Leatherwoodpollina

## 2017-07-23 NOTE — Code Documentation (Signed)
Family at the bedside.

## 2017-07-23 NOTE — Code Documentation (Signed)
asyatole

## 2017-07-23 NOTE — Code Documentation (Signed)
Dr Blinda Leatherwoodpollina in with family

## 2017-07-23 NOTE — ED Provider Notes (Addendum)
[facility]-EMERGENCY DEPT Provider Note   CSN: [id number] Arrival date & time: 01/18/2017  [id number]     History   Chief Complaint Chief Complaint  Patient presents with  . Cardiac Arrest    HPI Christina [name] is a 71 y.o. female.  Patient brought to the emergency department for evaluation after cardiac arrest. Patient was recently released from the hospital and went to a new nursing home. Staff did not know much about her when EMS arrived. Staff reportedly found her in her room moaning, discovered that she has been incontinent. They reportedly left the room, presumably to get something to help her up and when they returned she was completely unresponsive. CPR was initiated. EMS report asystole upon arrival. She received 25 minutes of CPR before return of spontaneous circulation during transport. She had pulses for approximately 8 minutes and then lost her pulses again during transport. At arrival to the ER CPR was in progress. Patient had received calcium, bicarbonate, epinephrine 9. She had been intubated prior to arrival.      Past Medical History:  Diagnosis Date  . HLD (hyperlipidemia)   . Hypertension   . PAF (paroxysmal atrial fibrillation) (HCC)    on Eliquis  . Recurrent cystitis   . Stroke [facility](HCC)     Patient Active Problem List   Diagnosis Date Noted  . Acute pyelonephritis 07/19/2017  . Sepsis (HCC) 07/16/2017  . Hyperglycemia 07/16/2017  . Severe protein-calorie malnutrition (HCC) 07/16/2017  . Blood pressure alteration   . Jerking   . Acute lower UTI   . Hypertensive crisis   . Broca's aphasia   . Cognitive communication disorder   . Cerebellar infarct (HCC) 06/06/2017  . 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 Uncontrolled hypertension 06/04/2017  . Fall at home 06/04/2017  . Altered mental status 06/04/2017  . History of CVA in adulthood 06/04/2017  . AF (paroxysmal atrial fibrillation) (HCC) 06/04/2017  . CKD (chronic kidney disease),  stage III 06/04/2017  . Depression 06/04/2017  . Aphasia due to acute stroke (HCC)   . Encephalopathy, hypertensive 05/07/2017  . Enterococcus UTI 05/07/2017  . Hypertensive urgency 05/02/2017  . Hypertensive emergency 05/01/2017  . History of suicidal ideation   . Hypokalemia   . Suicide ideation   . Sleep disturbance   . Labile blood pressure   . Acute kidney injury superimposed on chronic kidney disease (HCC)   . Stage 3 chronic kidney disease   . Anemia   . Benign essential HTN   . Adjustment disorder with mixed anxiety and depressed mood   . 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 Left hemiparesis (HCC)   . Primary osteoarthritis of left knee   . Left knee pain   . Diplopia   . Acute cystitis without hematuria   . Acute CVA (cerebrovascular accident) (HCC)   . History of CVA (cerebrovascular accident) 03/14/2017  . HTN (hypertension) 03/14/2017  . HLD (hyperlipidemia) 03/14/2017  . 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 ABDOMINAL HYSTERECTOMY    . EYE SURGERY      OB History    No data available       Home Medications    Prior to Admission medications   Medication Sig Start Date End Date Taking? Authorizing Provider  apixaban (ELIQUIS) 5 MG TABS tablet Take 1 tablet (5 mg total) by mouth 2 (two) times daily. 06/24/17  Yes Angiulli, Mcarthur Rossettianiel J, PA-C  atorvastatin (LIPITOR) 20 MG tablet Take 1 tablet (20 mg total) by mouth daily at 6 PM. Patient taking differently: Take 20 mg by mouth every morning.  06/24/17  Yes Angiulli, Mcarthur Rossettianiel J, PA-C  carvedilol (COREG) 25 MG tablet Take 1 tablet (25 mg total) by mouth 2 (two) times daily with a meal. 06/24/17  Yes Angiulli, Mcarthur Rossettianiel J, PA-C  feeding supplement, ENSURE ENLIVE, (ENSURE ENLIVE) LIQD Take 237 mLs by mouth 2 (two) times daily between meals. 07/19/17  Yes Short, Thea SilversmithMackenzie, MD  latanoprost (XALATAN) 0.005 % ophthalmic solution Place 1 drop into both eyes every  morning. Patient taking differently: Place 1 drop into both eyes at bedtime.  04/04/17  Yes Angiulli, Mcarthur Rossettianiel J, PA-C  LORazepam (ATIVAN) 0.5 MG tablet Take 0.5 tablets (0.25 mg total) by mouth 2 (two) times daily. 07/19/17  Yes Short, Thea SilversmithMackenzie, MD  sulfamethoxazole-trimethoprim (BACTRIM DS,SEPTRA DS) 800-160 MG tablet Take 1 tablet by mouth every 12 (twelve) hours. Sep 22, 2017  Yes Short, Thea SilversmithMackenzie, MD  tiZANidine (ZANAFLEX) 2 MG tablet Take 1 tablet (2 mg total) by mouth at bedtime. 06/24/17  Yes Angiulli, Mcarthur Rossettianiel J, PA-C  traZODone (DESYREL) 50 MG tablet Take 1 tablet (50 mg total) by mouth at bedtime as needed for sleep. 06/24/17  Yes Angiulli, Mcarthur Rossettianiel J, PA-C  VOLTAREN 1 % GEL Apply 2 g topically 4 (four) times daily. 06/24/17  Yes Angiulli, Mcarthur Rossettianiel J, PA-C    Family History Family History  Problem Relation Age of Onset  . Heart disease Mother   . Diabetes Mother   . Heart disease Father   . Heart disease Sister   . Heart disease Sister     Social History Social History  Substance Use Topics  . Smoking status: Former Games developermoker  . Smokeless tobacco: Never Used  . Alcohol use No     Allergies   Patient has no known allergies.   Review of Systems Review of Systems  Unable to perform ROS: Acuity of condition     Physical Exam Updated Vital Signs BP 102/73   Pulse 78   Resp 18   SpO2 90%   Physical Exam  Constitutional: She appears well-developed and well-nourished.  HENT:  Head: Atraumatic.  Eyes:  3 mm, fixed  Neck: Neck supple.  Cardiovascular:  No cardiac activity on arrival  Pulmonary/Chest:  Endotracheal tube in place, patient being bagged, coarse breath sounds bilaterally. Bloody secretions noted in endotracheal tube.  Abdominal: Soft.  Musculoskeletal: She exhibits no edema or deformity.  Neurological:  Unresponsive  Skin: Skin is dry.  Nursing note and vitals reviewed.    ED Treatments / Results  Labs (all labs ordered are listed, but only abnormal results  are displayed) Labs Reviewed  CBG MONITORING, ED - Abnormal; Notable for the following:       Result Value   Glucose-Capillary 175 (*)    All other components within normal limits  I-STAT CG4 LACTIC ACID, ED - Abnormal; Notable for the following:    Lactic Acid, Venous 10.14 (*)    All other components within normal limits  I-STAT CHEM 8, ED - Abnormal; Notable for the following:    Chloride 114 (*)    BUN 31 (*)    Creatinine, Ser 1.40 (*)    Glucose, Bld 187 (*)    Hemoglobin 8.5 (*)    HCT 25.0 (*)    All other components within normal limits  I-STAT TROPONIN, ED - Abnormal; Notable for the following:    Troponin i, poc 7.88 (*)  All other components within normal limits  CBC WITH DIFFERENTIAL/PLATELET  COMPREHENSIVE METABOLIC PANEL  I-STAT ARTERIAL BLOOD GAS, ED    EKG  EKG Interpretation None       Radiology Ct Abdomen Pelvis Wo Contrast  Result Date: 07/19/2017 CLINICAL DATA:  Anemia, decreasing hemoglobin, concern for unexplained hemorrhage, back pain EXAM: CT ABDOMEN AND PELVIS WITHOUT CONTRAST TECHNIQUE: Multidetector CT imaging of the abdomen and pelvis was performed following the standard protocol without IV contrast. COMPARISON:  None available FINDINGS: Lower chest: Limited with motion artifact. Trace bilateral pleural effusions but slightly larger on the left. Minor dependent bibasilar atelectasis. Mitral valve annular calcifications noted. Mild cardiomegaly. No pericardial effusion. No aortic atherosclerosis evident. Hepatobiliary: No focal liver abnormality is seen. No gallstones, gallbladder wall thickening, or biliary dilatation. Pancreas: Unremarkable. No pancreatic ductal dilatation or surrounding inflammatory changes. Spleen: Normal in size without focal abnormality. Adrenals/Urinary Tract: Normal adrenal glands for age. Both kidneys demonstrate extensive renal vascular calcifications from atherosclerosis. No acute obstructive uropathy, hydronephrosis,  hydroureter, or associated obstructing ureteral calculus. Urinary bladder unremarkable. Left renal atrophy evident. Renal cortical nodularity present bilaterally, suspect a component of cortical atrophy and small scattered renal cysts, incompletely characterized by noncontrast imaging. Stomach/Bowel: Stomach is within normal limits. Appendix appears normal. No evidence of bowel wall thickening, distention, or inflammatory changes. Vascular/Lymphatic: Abdominal atherosclerosis evident. Negative for aneurysm. No retroperitoneal hemorrhage or hematoma. Reproductive: Remote hysterectomy.  No adnexal mass. Other: No abdominal wall hernia or abnormality. No abdominopelvic ascites. Musculoskeletal: Degenerative changes noted of the spine. No acute compression fracture. No acute osseous finding. IMPRESSION: No acute intra-abdominal or pelvic finding by noncontrast CT. Small pleural effusions and bibasilar atelectasis Abdominal atherosclerosis No acute intra-abdominal or pelvic hematoma. No retroperitoneal abnormality. Electronically Signed   By: Judie Petit.  Shick M.D.   On: 07/19/2017 09:54   Dg Chest Port 1 View  Result Date: August 12, 2017 CLINICAL DATA:  CPR.  Intubation. EXAM: PORTABLE CHEST 1 VIEW COMPARISON:  07/16/2017 FINDINGS: Endotracheal tube is 6.1 cm above the carina. Central airspace opacities bilaterally. No pneumothorax. No large effusion. IMPRESSION: Satisfactorily positioned ETT. Central airspace opacities bilaterally. Electronically Signed   By: Ellery Plunk M.D.   On: 08-12-2017 06:25    Procedures Procedures (including critical care time)  Medications Ordered in ED Medications  EPINEPHrine (ADRENALIN) 1 MG/10ML injection (1 mg Intravenous Given Aug 12, 2017 0540)  sodium bicarbonate injection (50 mEq Intravenous Given 08/12/2017 0535)  norepinephrine (LEVOPHED) 4 mg in dextrose 5 % 250 mL (0.016 mg/mL) infusion (0 mcg/min Intravenous Stopped 08/12/2017 0615)     Initial Impression / Assessment and Plan  / ED Course  I have reviewed the triage vital signs and the nursing notes.  Pertinent labs & imaging results that were available during my care of the patient were reviewed by me and considered in my medical decision making (see chart for details).     Patient arrived via EMS with CPR in progress. She had an approximate 25 minute downtime with CPR prior to regaining spontaneous circulation. She lost her pulse is approximately 10 minutes before arriving in the ER. ACLS had been followed. Patient was intubated by EMS, administer calcium and bicarbonate she has renal insufficiency and was administered epinephrine 9 doses during CPR. CPR was continued here in the ER. She was given additional bicarbonate and epinephrine. There was return of spontaneous circulation noted. At this point I talked to the patient's daughter. I informed the daughter that the patient was unlikely to have a meaningful recovery from her  current condition based on the total of 50 minutes of CPR she had received. After a conversation it was determined by the daughter that we would not perform CPR and further or escalate care, continue current treatment. Patient did bradycardia down and become asystole a can. Family was brought to the bedside. Patient was declared dead at [phone].  Discussed case with on-call medical examiner. He informs me that the patient will not be a medical examiner case. I am unable to reach the patient's primary care physician because it is the weekend and there is no on-call schedule. Medical examiner feels that paperwork including death certificate can be forwarded to primary care doctor on Monday by the nursing home.  Cardiopulmonary Resuscitation (CPR) Procedure Note Directed/Performed by: Gilda Crease I personally directed ancillary staff and/or performed CPR in an effort to regain return of spontaneous circulation and to maintain cardiac, neuro and systemic perfusion.    CRITICAL CARE Performed  by: Gilda Crease   Total critical care time: 30 minutes  Critical care time was exclusive of separately billable procedures and treating other patients.  Critical care was necessary to treat or prevent imminent or life-threatening deterioration.  Critical care was time spent personally by me on the following activities: development of treatment plan with patient and/or surrogate as well as nursing, discussions with consultants, evaluation of patient's response to treatment, examination of patient, obtaining history from patient or surrogate, ordering and performing treatments and interventions, ordering and review of laboratory studies, ordering and review of radiographic studies, pulse oximetry and re-evaluation of patient's condition.   Final Clinical Impressions(s) / ED Diagnoses   Final diagnoses:  Cardiopulmonary arrest [facility])    New Prescriptions New Prescriptions   No medications on file     Gilda Crease, MD August 15, 2017 [id number]    Gilda Crease, MD August 15, 2017 [phone]

## 2017-07-23 NOTE — Progress Notes (Signed)
Responded to page to ED for post-CPR pt w/ significant down time at new nursing home. Provided emotional/spiritual/grief support, ministry of presence/touch, and Catholic prayer to deeply grieving daughter Arline AspCindy and her husband Luisa Hartatrick. Pt had just moved here from [place] last March, after having had a stroke there (where med care is still in short supply last fall's [place] Daughter had been caregiver, felt very responsible.   Accompanied family to consultation B and conversation w/ doctor, where daughter made decision her mom wouldn't want to be hooked up to machines and so should be allowed to die naturally if her heart stopped again. That happened almost immediately, so there was no time to [facility] [employer].   The couple grieved in trauma rm, and we prayed again, after doctor told daughter that pt was gone. Sat w/ couple a while, also gave pt placement card. Asked them to tell nurse when they were ready to leave. They will first call parish priest, and later [facility] w/ [facility] plans.   25-Jan-2017 0500  Clinical Encounter Type  Visited With Family;Health care provider  Visit Type Initial;Psychological support;Spiritual support;Social support;Death;ED  Referral From Nurse  Spiritual Encounters  Spiritual Needs Prayer;Emotional;Grief support  Stress Factors  Family Stress Factors Loss   Ephraim Hamburgerynthia A Detra Bores, [address]

## 2017-07-23 NOTE — ED Notes (Signed)
[place] [facility] contacted  Body suitable for tissue donation    Donor number [phone]  latoshia moore cordinator

## 2017-07-23 NOTE — Code Documentation (Signed)
cpr continuies pt on the lucas

## 2017-07-23 DEATH — deceased

## 2017-08-12 NOTE — Progress Notes (Signed)
   07/17/17 0941  SLP G-Codes **NOT FOR INPATIENT CLASS**  Functional Assessment Tool Used clinical judgement  Functional Limitations Swallowing  Swallow Current Status (U8280) CI  Swallow Goal Status (K3491) CI  Swallow Discharge Status (P9150) CI  SLP Evaluations  $ SLP Speech Visit 1 Procedure  SLP Evaluations  $BSS Swallow 1 Procedure  Donavan Burnet, MS [facility] SLP [phone]

## 2017-08-17 NOTE — Progress Notes (Signed)
Physical Therapy Late Note For G-codes    07/25/17 Apr 21, 2313  PT Visit Information  Last PT Received On 2017-07-25  PT G-Codes **NOT FOR INPATIENT CLASS**  Functional Assessment Tool Used AM-PAC 6 Clicks Basic Mobility  Functional Limitation Mobility: Walking and moving around  Mobility: Walking and Moving Around Current Status [phone]) CN  Mobility: Walking and Moving Around Goal Status (M6381) CN  Mobility: Walking and Moving Around Discharge Status [phone]) CN  Durenda Hurt. Renaldo Fiddler, [facility] Acute Rehab Services Pager [phone]

## 2017-08-23 DEATH — deceased

## 2019-02-07 IMAGING — CT CT HEAD W/O CM
2 of 6 series · 11 of 47 positions shown, 13 images · non-contrast
Comparison: 03/15/2017 head and neck CTA. Head CT and MRI
03/14/2017.

CLINICAL DATA: Confusion for a few days with hypertension. Prior
strokes.

EXAM:
CT HEAD WITHOUT CONTRAST
TECHNIQUE: Contiguous axial images were obtained from the base of the skull
through the vertex without intravenous contrast.

[Series 4: head 5.0 h30s · axial · 0.43mm/px · z∈[+51,+166]mm · 8 of 29 slices shown, 10 images]
[im 3/29  brain]
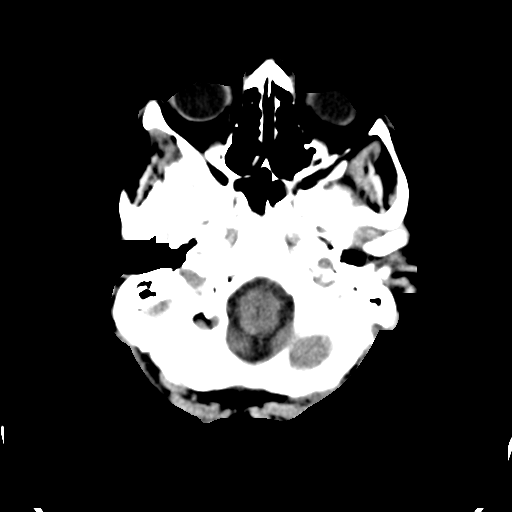
[im 3/29  bone]
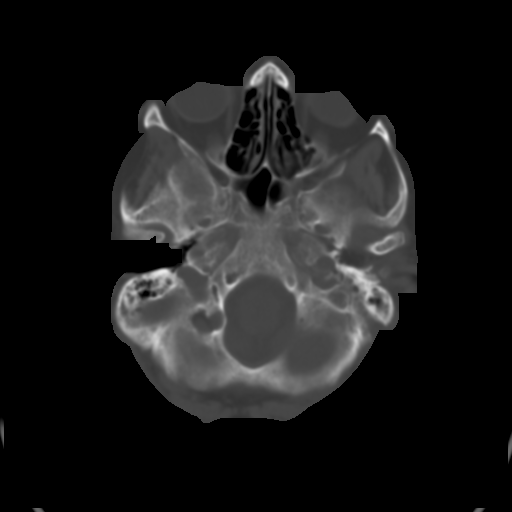
[im 6/29  brain]
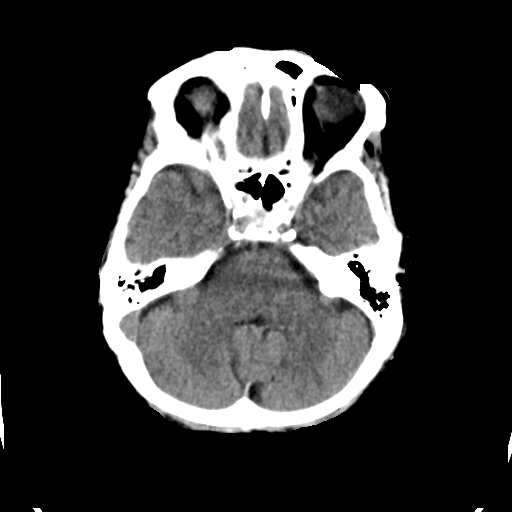
[im 10/29  brain]
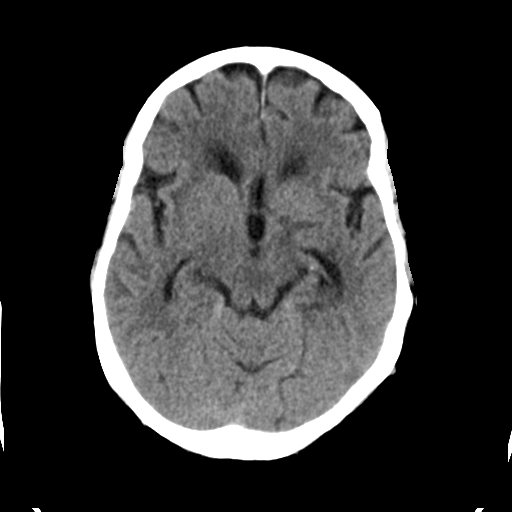
[im 13/29  brain]
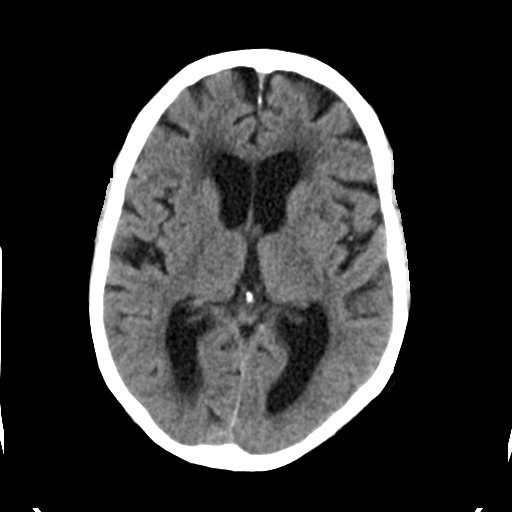
[im 17/29  brain]
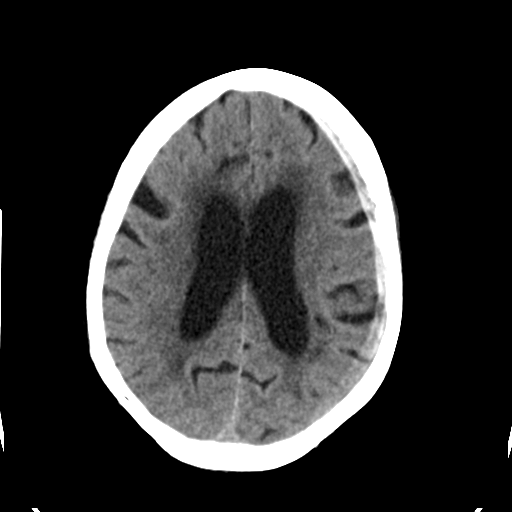
[im 17/29  bone]
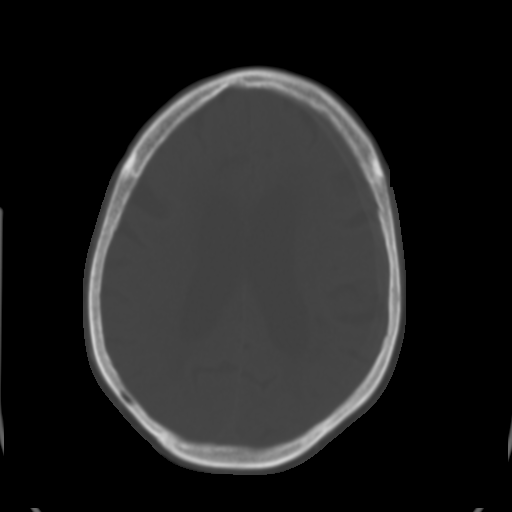
[im 19/29  brain]
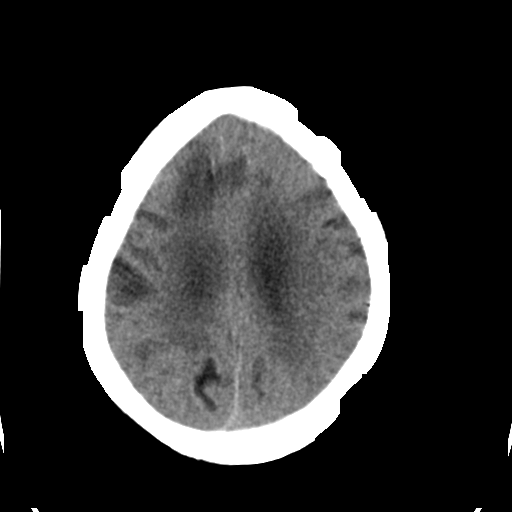
[im 23/29  brain]
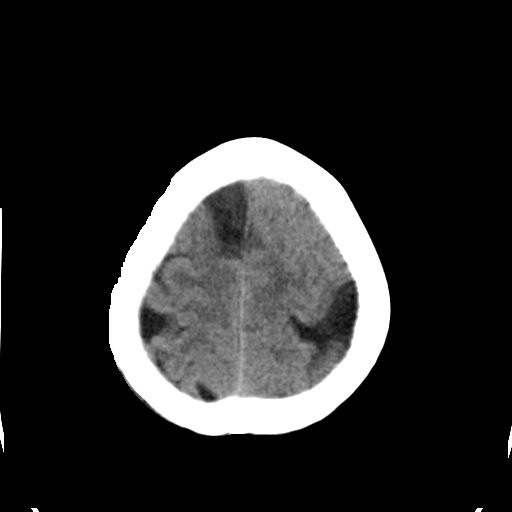
[im 26/29  brain]
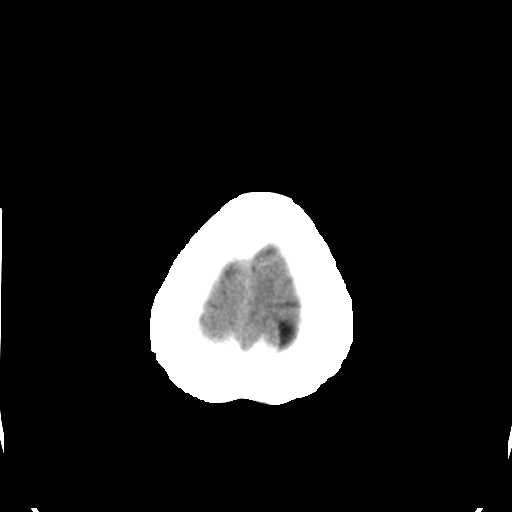

[Series 9: head 3.0 mpr cor · coronal · 0.12mm/px · 3 of 58 slices shown]
[im 15/58  brain]
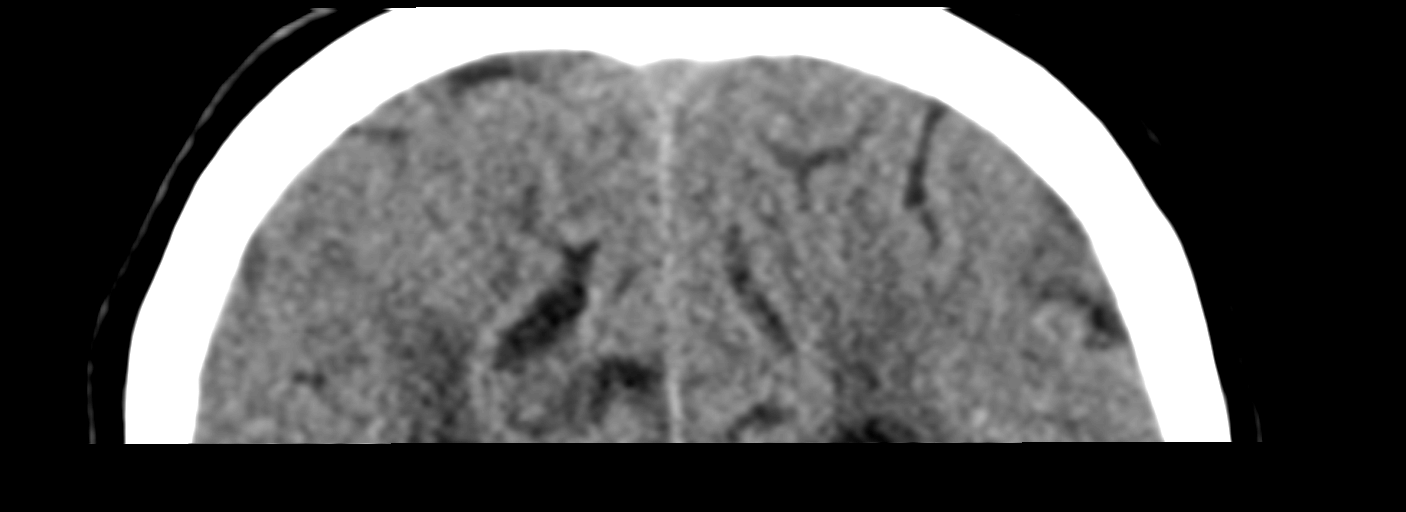
[im 29/58  brain]
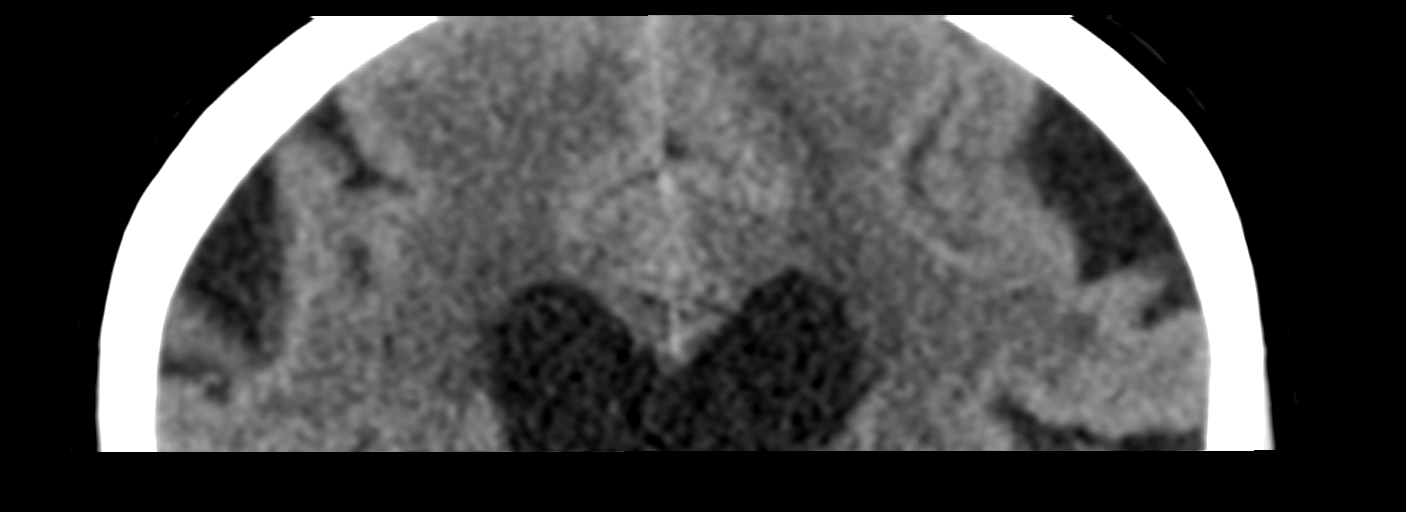
[im 43/58  brain]
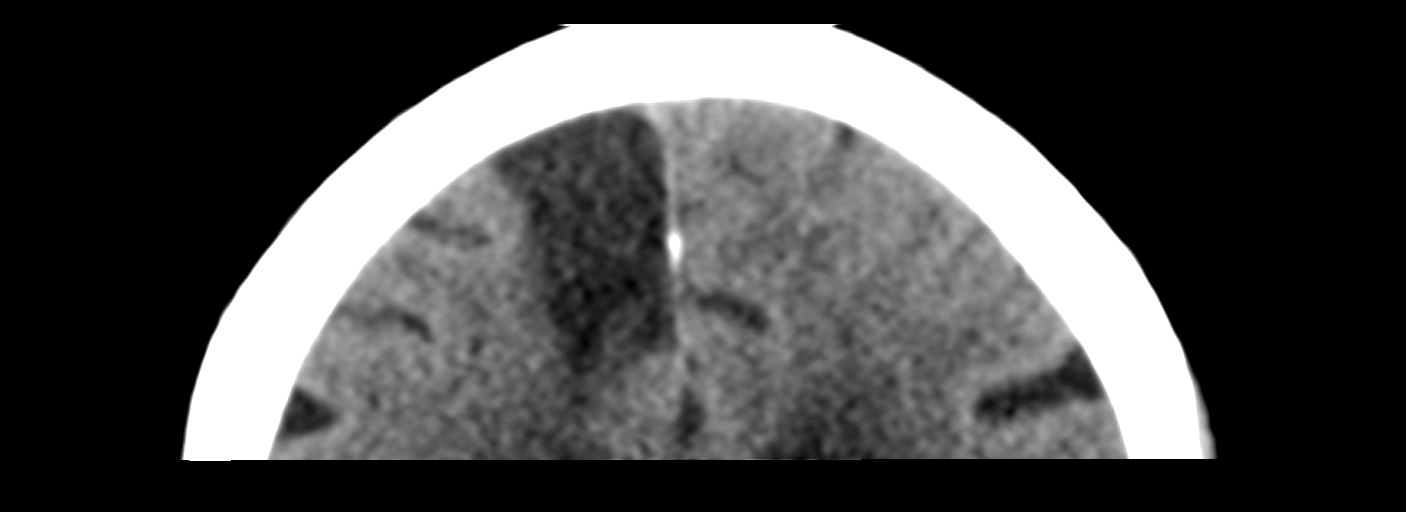

[11 of 47 positions shown; findings below may reference images not displayed]

FINDINGS: Brain: The previously demonstrated medial right frontal lobe infarct
demonstrates expected interval evolution and now has a more chronic
appearance with development of encephalomalacia. There is a small
focus of subtle hypodensity in the dorsal right thalamus
corresponding to the lacunar infarct on MRI. Small left
parieto-occipital infarcts also now have a more chronic appearance.
Small chronic infarcts are again seen in the bilateral cerebellum,
left cerebral white matter, and left lentiform nucleus. Additional
confluent periventricular white matter hypoattenuation bilaterally
is unchanged and compatible with moderate chronic small vessel
ischemic disease. No definite acute infarct, intracranial
hemorrhage, mass, midline shift, or extra-axial fluid collection is
seen. Prominence of the lateral and third ventricles is unchanged
and may reflect cerebral atrophy or normal pressure hydrocephalus.

Vascular: Calcified atherosclerosis at the skullbase. No hyperdense
vessel.

Skull: No fracture or focal osseous lesion.

Sinuses/Orbits: Visualized paranasal sinuses and mastoid air cells
are clear. Bilateral cataract extraction is noted.

Other: None.
IMPRESSION: 1. No evidence of acute intracranial abnormality.
2. Extensive chronic ischemic changes as above, with expected
interval evolution of subacute infarcts demonstrated in [DATE].
